# Patient Record
Sex: Female | Born: 1940
Health system: Southern US, Community
[De-identification: ages and names within clinical notes are randomized; demographics above are authoritative.]

## PROBLEM LIST (undated history)

## (undated) DIAGNOSIS — N189 Chronic kidney disease, unspecified: Secondary | ICD-10-CM

## (undated) DIAGNOSIS — M1990 Unspecified osteoarthritis, unspecified site: Secondary | ICD-10-CM

## (undated) DIAGNOSIS — J189 Pneumonia, unspecified organism: Secondary | ICD-10-CM

## (undated) DIAGNOSIS — K219 Gastro-esophageal reflux disease without esophagitis: Secondary | ICD-10-CM

## (undated) DIAGNOSIS — R0600 Dyspnea, unspecified: Secondary | ICD-10-CM

## (undated) DIAGNOSIS — I774 Celiac artery compression syndrome: Secondary | ICD-10-CM

## (undated) DIAGNOSIS — I729 Aneurysm of unspecified site: Secondary | ICD-10-CM

## (undated) DIAGNOSIS — Z853 Personal history of malignant neoplasm of breast: Secondary | ICD-10-CM

## (undated) DIAGNOSIS — I10 Essential (primary) hypertension: Secondary | ICD-10-CM

## (undated) DIAGNOSIS — C50919 Malignant neoplasm of unspecified site of unspecified female breast: Secondary | ICD-10-CM

## (undated) DIAGNOSIS — B029 Zoster without complications: Secondary | ICD-10-CM

## (undated) DIAGNOSIS — Z972 Presence of dental prosthetic device (complete) (partial): Secondary | ICD-10-CM

## (undated) DIAGNOSIS — I2699 Other pulmonary embolism without acute cor pulmonale: Secondary | ICD-10-CM

## (undated) DIAGNOSIS — E039 Hypothyroidism, unspecified: Secondary | ICD-10-CM

## (undated) DIAGNOSIS — C801 Malignant (primary) neoplasm, unspecified: Secondary | ICD-10-CM

## (undated) HISTORY — DX: Hypothyroidism, unspecified: E03.9

## (undated) HISTORY — DX: Personal history of malignant neoplasm of breast: Z85.3

## (undated) HISTORY — PX: COLONOSCOPY: SHX174

## (undated) HISTORY — DX: Essential (primary) hypertension: I10

## (undated) HISTORY — DX: Other pulmonary embolism without acute cor pulmonale: I26.99

## (undated) HISTORY — DX: Malignant (primary) neoplasm, unspecified: C80.1

## (undated) HISTORY — DX: Celiac artery compression syndrome: I77.4

## (undated) HISTORY — PX: BRAIN SURGERY: SHX531

## (undated) HISTORY — DX: Aneurysm of unspecified site: I72.9

## (undated) HISTORY — PX: LUMBAR PUNCTURE: SHX1985

## (undated) HISTORY — DX: Chronic kidney disease, unspecified: N18.9

---

## 1978-11-22 DIAGNOSIS — I10 Essential (primary) hypertension: Secondary | ICD-10-CM

## 1978-11-22 DIAGNOSIS — I729 Aneurysm of unspecified site: Secondary | ICD-10-CM

## 1978-11-22 HISTORY — DX: Essential (primary) hypertension: I10

## 1978-11-22 HISTORY — DX: Aneurysm of unspecified site: I72.9

## 1997-11-22 DIAGNOSIS — E039 Hypothyroidism, unspecified: Secondary | ICD-10-CM

## 1997-11-22 HISTORY — DX: Hypothyroidism, unspecified: E03.9

## 2002-11-22 HISTORY — PX: CHOLECYSTECTOMY: SHX55

## 2003-11-23 HISTORY — PX: BREAST CYST ASPIRATION: SHX578

## 2004-10-07 ENCOUNTER — Ambulatory Visit: Payer: Self-pay

## 2005-10-06 ENCOUNTER — Ambulatory Visit: Payer: Self-pay | Admitting: Family Medicine

## 2007-02-23 ENCOUNTER — Ambulatory Visit: Payer: Self-pay | Admitting: *Deleted

## 2007-02-28 ENCOUNTER — Ambulatory Visit: Payer: Self-pay | Admitting: Gastroenterology

## 2008-03-14 ENCOUNTER — Ambulatory Visit: Payer: Self-pay | Admitting: Family Medicine

## 2008-08-06 ENCOUNTER — Ambulatory Visit: Payer: Self-pay | Admitting: Family Medicine

## 2008-08-26 ENCOUNTER — Ambulatory Visit: Payer: Self-pay | Admitting: Surgery

## 2008-08-26 ENCOUNTER — Other Ambulatory Visit: Payer: Self-pay

## 2008-08-28 ENCOUNTER — Ambulatory Visit: Payer: Self-pay | Admitting: Surgery

## 2008-09-23 ENCOUNTER — Ambulatory Visit: Payer: Self-pay | Admitting: Gastroenterology

## 2009-08-05 ENCOUNTER — Ambulatory Visit: Payer: Self-pay | Admitting: Family Medicine

## 2011-11-23 HISTORY — PX: BREAST EXCISIONAL BIOPSY: SUR124

## 2011-11-23 HISTORY — PX: BREAST SURGERY: SHX581

## 2012-01-26 ENCOUNTER — Ambulatory Visit: Payer: Self-pay | Admitting: Family Medicine

## 2012-01-28 ENCOUNTER — Ambulatory Visit: Payer: Self-pay | Admitting: Family Medicine

## 2012-02-21 ENCOUNTER — Ambulatory Visit: Payer: Self-pay | Admitting: General Surgery

## 2012-02-21 LAB — POTASSIUM: Potassium: 3.7 mmol/L (ref 3.5–5.1)

## 2012-02-23 ENCOUNTER — Ambulatory Visit: Payer: Self-pay | Admitting: General Surgery

## 2012-07-31 ENCOUNTER — Ambulatory Visit: Payer: Self-pay | Admitting: General Surgery

## 2012-12-21 ENCOUNTER — Encounter: Payer: Self-pay | Admitting: *Deleted

## 2013-02-01 ENCOUNTER — Ambulatory Visit: Payer: Self-pay | Admitting: General Surgery

## 2013-02-08 ENCOUNTER — Encounter: Payer: Self-pay | Admitting: General Surgery

## 2013-02-08 ENCOUNTER — Ambulatory Visit (INDEPENDENT_AMBULATORY_CARE_PROVIDER_SITE_OTHER): Payer: Medicare Other | Admitting: General Surgery

## 2013-02-08 VITALS — BP 124/72 | Ht 60.0 in | Wt 193.0 lb

## 2013-02-08 DIAGNOSIS — N6099 Unspecified benign mammary dysplasia of unspecified breast: Secondary | ICD-10-CM

## 2013-02-08 DIAGNOSIS — N6089 Other benign mammary dysplasias of unspecified breast: Secondary | ICD-10-CM

## 2013-02-08 NOTE — Progress Notes (Signed)
Patient ID: Latoya Freeman, female   DOB: 21-Nov-1941, 72 y.o.   MRN: [id number]  Chief Complaint  Patient presents with  . Follow-up    mammogram     HPI Latoya Freeman is a 72 y.o. female following up from a mammogram done @[facility]  02/01/13. Patient reports no new breast problem.The patient reports she was unable to tolerate tamoxifen secondary to profound vasomotor symptoms. Previous [facility] Jan[employer] model risk analysis showed approximately 2.9% risk for breast cancer next 5 years. HPI  Past Medical History  Diagnosis Date  . Hypertension 1980  . Personal history of malignant neoplasm of breast 2013    LF Breast   . Hypothyroidism 1999  . Aneurysm 1980  . Cancer breast    2013    Past Surgical History  Procedure Laterality Date  . Breast surgery  2013    LF Breast Wide EXC   . Cholecystectomy  2004    No family history on file.  Social History History  Substance Use Topics  . Smoking status: Former Smoker -- 1.50 packs/day for 20 years  . Smokeless tobacco: Never Used  . Alcohol Use: No    Allergies  Allergen Reactions  . Penicillins Rash    Current Outpatient Prescriptions  Medication Sig Dispense Refill  . amLODipine (NORVASC) 5 MG tablet       . aspirin 81 MG tablet Take 81 mg by mouth daily.      . citalopram (CELEXA) 10 MG tablet Take 10 mg by mouth daily.      . hydrochlorothiazide (HYDRODIURIL) 25 MG tablet       . levothyroxine (SYNTHROID, LEVOTHROID) 75 MCG tablet Take 75 mcg by mouth daily.      Marland Kitchen losartan (COZAAR) 25 MG tablet Take 25 mg by mouth daily.      . medroxyPROGESTERone (PROVERA) 2.5 MG tablet Take 2.5 mg by mouth daily.      . montelukast (SINGULAIR) 10 MG tablet       . omeprazole (PRILOSEC) 20 MG capsule Take 20 mg by mouth daily.      Marland Kitchen oxybutynin (DITROPAN) 5 MG tablet Take 5 mg by mouth once.      . quinapril (ACCUPRIL) 40 MG tablet Take 40 mg by mouth at bedtime.      . tamoxifen (NOLVADEX) 20 MG tablet Take 20 mg by mouth daily.       No current  facility-administered medications for this visit.    Review of Systems Review of Systems  Constitutional: Negative.   Respiratory: Negative.   Cardiovascular: Negative.     Blood pressure 124/72, height 5' (1.524 m), weight 193 lb (87.544 kg).  Physical Exam Physical Exam  Constitutional: She appears well-developed and well-nourished.  Neck: Normal range of motion. Neck supple.  Cardiovascular: Normal rate, regular rhythm and normal heart sounds.   Pulmonary/Chest: Breath sounds normal. Right breast exhibits no inverted nipple, no nipple discharge and no skin change. Left breast exhibits no inverted nipple, no mass, no nipple discharge, no skin change and no tenderness.  Well healed scar left lower outer quadrant.    Data Reviewed Bilateral milligrams February 01, 2013 showed benign left axillary lymph node. No areas of suspicious microcalcifications or dominant mass. BIRAD-2.    Assessment    Normal post-biopsy exam     Plan    Patient to return in one year diagnotic mammogram     Latoya Freeman 02/08/2013, 3:09 PM

## 2013-04-06 ENCOUNTER — Ambulatory Visit: Payer: Self-pay | Admitting: Gastroenterology

## 2013-04-09 LAB — PATHOLOGY REPORT

## 2014-01-15 ENCOUNTER — Telehealth: Payer: Self-pay

## 2014-01-15 NOTE — Telephone Encounter (Signed)
Patient states that she would like to follow up with her doctor Jeneen Rinks Hedricks from now on. If she has any further breast problems she will be seen here for them. She said that she can not afford to be seen by multiple doctors unless it is needed.

## 2014-01-21 ENCOUNTER — Telehealth: Payer: Self-pay

## 2014-01-21 NOTE — Telephone Encounter (Signed)
Patient notified that Dr Verneda Skill will take over her care from now on. Patient is agreeable.

## 2014-01-21 NOTE — Telephone Encounter (Signed)
Message copied by Lesly Rubenstein on Mon Jan 21, 2014  9:29 AM ------      Message from: [place], [place] Gleason      Created: Wed Jan 16, 2014  3:06 PM      Regarding: RE: recalls       Notify her that Dr. Verneda Skill will need to take responsibility for future mammograms as well as Tamoxifen RX.        ----- Message -----         From: Lesly Rubenstein, LPN         Sent: [id number]  10:28 AM           To: Robert Bellow, MD      Subject: FW: recalls                                                          ----- Message -----         From: Lesly Rubenstein, LPN         Sent: [id number]   2:12 PM           To: Veverly Fells      Subject: recalls                                                  Patient in recalls for April, she has a mammogram scheduled for march. Patient states that she would like to follow up with her doctor Jeneen Rinks Hedricks from now on. She will keep her mammogram appointment. If she has any further breast problems she will be seen here for them. She said that she can not afford to be seen by multiple doctors unless it is needed.                                     ------

## 2014-02-04 ENCOUNTER — Encounter: Payer: Self-pay | Admitting: General Surgery

## 2014-02-04 ENCOUNTER — Ambulatory Visit: Payer: Self-pay | Admitting: General Surgery

## 2014-02-14 ENCOUNTER — Ambulatory Visit: Payer: Self-pay | Admitting: Ophthalmology

## 2014-02-14 DIAGNOSIS — I10 Essential (primary) hypertension: Secondary | ICD-10-CM

## 2014-02-14 LAB — POTASSIUM: POTASSIUM: 3 mmol/L — AB (ref 3.5–5.1)

## 2014-02-21 ENCOUNTER — Ambulatory Visit: Payer: Medicare Other | Admitting: General Surgery

## 2014-02-26 ENCOUNTER — Ambulatory Visit: Payer: Self-pay | Admitting: Ophthalmology

## 2014-09-06 ENCOUNTER — Emergency Department: Payer: Self-pay | Admitting: Emergency Medicine

## 2014-09-23 ENCOUNTER — Encounter: Payer: Self-pay | Admitting: General Surgery

## 2014-12-10 DIAGNOSIS — J40 Bronchitis, not specified as acute or chronic: Secondary | ICD-10-CM | POA: Diagnosis not present

## 2014-12-10 DIAGNOSIS — J0100 Acute maxillary sinusitis, unspecified: Secondary | ICD-10-CM | POA: Diagnosis not present

## 2015-03-15 NOTE — Op Note (Signed)
PATIENT NAME:  Latoya Freeman, Latoya Freeman MR#:  [id number] DATE OF BIRTH:  08/28/1941  DATE OF PROCEDURE:  02/26/2014  PREOPERATIVE DIAGNOSIS: Visually significant cataract of the left eye.   POSTOPERATIVE DIAGNOSIS: Visually significant cataract of the left eye.   OPERATIVE PROCEDURE: Cataract extraction by phacoemulsification with implant of intraocular lens to left eye.   SURGEON: Birder Robson, MD.   ANESTHESIA:  1. Managed anesthesia care.  2. Topical tetracaine drops followed by 2% Xylocaine jelly applied in the preoperative holding area.   COMPLICATIONS: None.   TECHNIQUE:  Stop and chop.   DESCRIPTION OF PROCEDURE: The patient was examined and consented in the preoperative holding area where the aforementioned topical anesthesia was applied to the left eye and then brought back to the Operating Room where the left eye was prepped and draped in the usual sterile ophthalmic fashion and a lid speculum was placed. A paracentesis was created with the side port blade and the anterior chamber was filled with viscoelastic. A near clear corneal incision was performed with the steel keratome. A continuous curvilinear capsulorrhexis was performed with a cystotome followed by the capsulorrhexis forceps. Hydrodissection and hydrodelineation were carried out with BSS on a blunt cannula. The lens was removed in a stop and chop technique and the remaining cortical material was removed with the irrigation-aspiration handpiece. The capsular bag was inflated with viscoelastic and the Alcon SN60WF 19.5-diopter lens, serial number [id number] was placed in the capsular bag without complication. The remaining viscoelastic was removed from the eye with the irrigation-aspiration handpiece. The wounds were hydrated. The anterior chamber was flushed with Miostat and the eye was inflated to physiologic pressure. Cefuroxime was not placed in the anterior chamber due to marked PENICILLIN ALLERGY, rather 0.2 mL of a diluted  Vigamox solution was placed into the anterior chamber at the end of the case. The wounds were found to be water tight. The eye was dressed with Vigamox. The patient was given protective glasses to wear throughout the day and a shield with which to sleep tonight. The patient was also given drops with which to begin a drop regimen today and will follow-up with me in one day.    ____________________________ Livingston Diones. Telicia Hodgkiss, MD wlp:sb D: 02/26/2014 12:13:34 ET T: 02/26/2014 12:51:29 ET JOB#: [id number]  cc: Quayshaun Hubbert L. Harlo Jaso, MD, <Dictator> Livingston Diones Tysen Roesler MD ELECTRONICALLY SIGNED 03/07/2014 10:52

## 2015-03-16 NOTE — Op Note (Signed)
PATIENT NAME:  Latoya Freeman, Latoya Freeman MR#:  [id number] DATE OF BIRTH:  09-19-1941  DATE OF PROCEDURE:  02/23/2012  PREOPERATIVE DIAGNOSIS: Atypical ductal hyperplasia, left breast.   POSTOPERATIVE DIAGNOSIS: Atypical ductal hyperplasia, left breast.   OPERATIVE PROCEDURE: Wide local excision of left breast.   SURGEON: Hervey Ard, MD  ANESTHESIA: General by LMA, Marcaine 0.5% plain 30 mL local infiltration.   ESTIMATED BLOOD LOSS: Less than 10 mL.   CLINICAL NOTE: This 74 year old woman underwent vacuum-assisted biopsy of a right breast mass, which was primarily cystic in nature. She was found to have evidence of ADH. Wide excision was planned to rule out a more invasive process.   OPERATIVE NOTE: With the patient under adequate general anesthesia, the area was prepped with ChloraPrep and draped. Ultrasound was used to confirm the previous biopsy cavity just outside the edge of the nipple, at the 5 o'clock position. A radial incision was made in this area from the base of the nipple to approximately 4 cm outside of the areola. The skin was incised sharply and the deep dissection completed with electrocautery. The original biopsy cavity was resected with approximately a 1 cm rim of normal tissue. Specimen radiograph confirmed the previously placed clip was removed. Gross examination showed the cavity to be intact.   The wound was closed in layers with interrupted 2-0 Vicryl figure-of-eight sutures. The skin and adipose tissue was mobilized slightly with cautery to provide smoother reapproximation of the          skin with a running 4-0 Vicryl subcuticular suture. Benzoin and Steri-Strips were applied followed by a Telfa pad and a bulky dressing with Kerlix, fluff gauze, and an Ace wrap.   The patient tolerated the procedure well and was taken to the recovery room in stable condition. ____________________________ Robert Bellow, MD jwb:slb D: 02/23/2012 20:59:38 ET T: 02/24/2012  09:36:19 ET JOB#: [id number]  cc: Robert Bellow, MD, <Dictator> Irven Easterly. Kary Kos, MD Esme Durkin Amedeo Kinsman MD ELECTRONICALLY SIGNED 02/24/2012 17:37

## 2015-03-25 DIAGNOSIS — I10 Essential (primary) hypertension: Secondary | ICD-10-CM | POA: Diagnosis not present

## 2015-03-25 DIAGNOSIS — Z0000 Encounter for general adult medical examination without abnormal findings: Secondary | ICD-10-CM | POA: Diagnosis not present

## 2015-03-25 DIAGNOSIS — E039 Hypothyroidism, unspecified: Secondary | ICD-10-CM | POA: Diagnosis not present

## 2015-03-25 DIAGNOSIS — E785 Hyperlipidemia, unspecified: Secondary | ICD-10-CM | POA: Diagnosis not present

## 2015-03-31 ENCOUNTER — Other Ambulatory Visit: Payer: Self-pay | Admitting: Family Medicine

## 2015-03-31 DIAGNOSIS — I10 Essential (primary) hypertension: Secondary | ICD-10-CM | POA: Diagnosis not present

## 2015-03-31 DIAGNOSIS — E785 Hyperlipidemia, unspecified: Secondary | ICD-10-CM | POA: Diagnosis not present

## 2015-03-31 DIAGNOSIS — E039 Hypothyroidism, unspecified: Secondary | ICD-10-CM | POA: Diagnosis not present

## 2015-03-31 DIAGNOSIS — Z1231 Encounter for screening mammogram for malignant neoplasm of breast: Secondary | ICD-10-CM

## 2015-03-31 DIAGNOSIS — N62 Hypertrophy of breast: Secondary | ICD-10-CM | POA: Diagnosis not present

## 2015-04-16 ENCOUNTER — Other Ambulatory Visit: Payer: Self-pay | Admitting: Family Medicine

## 2015-04-16 ENCOUNTER — Ambulatory Visit
Admission: RE | Admit: 2015-04-16 | Discharge: 2015-04-16 | Disposition: A | Discharge status: Home / Self Care | Payer: Commercial Managed Care - HMO | Source: Ambulatory Visit | Attending: Family Medicine | Admitting: Family Medicine

## 2015-04-16 DIAGNOSIS — Z1231 Encounter for screening mammogram for malignant neoplasm of breast: Secondary | ICD-10-CM

## 2015-04-17 DIAGNOSIS — M5134 Other intervertebral disc degeneration, thoracic region: Secondary | ICD-10-CM | POA: Diagnosis not present

## 2015-04-17 DIAGNOSIS — M9902 Segmental and somatic dysfunction of thoracic region: Secondary | ICD-10-CM | POA: Diagnosis not present

## 2015-04-17 DIAGNOSIS — M9903 Segmental and somatic dysfunction of lumbar region: Secondary | ICD-10-CM | POA: Diagnosis not present

## 2015-04-17 DIAGNOSIS — M62830 Muscle spasm of back: Secondary | ICD-10-CM | POA: Diagnosis not present

## 2015-04-18 DIAGNOSIS — M62830 Muscle spasm of back: Secondary | ICD-10-CM | POA: Diagnosis not present

## 2015-04-18 DIAGNOSIS — M9902 Segmental and somatic dysfunction of thoracic region: Secondary | ICD-10-CM | POA: Diagnosis not present

## 2015-04-18 DIAGNOSIS — M9903 Segmental and somatic dysfunction of lumbar region: Secondary | ICD-10-CM | POA: Diagnosis not present

## 2015-04-18 DIAGNOSIS — M5134 Other intervertebral disc degeneration, thoracic region: Secondary | ICD-10-CM | POA: Diagnosis not present

## 2015-04-22 DIAGNOSIS — M9902 Segmental and somatic dysfunction of thoracic region: Secondary | ICD-10-CM | POA: Diagnosis not present

## 2015-04-22 DIAGNOSIS — M62830 Muscle spasm of back: Secondary | ICD-10-CM | POA: Diagnosis not present

## 2015-04-22 DIAGNOSIS — M9903 Segmental and somatic dysfunction of lumbar region: Secondary | ICD-10-CM | POA: Diagnosis not present

## 2015-04-22 DIAGNOSIS — M5134 Other intervertebral disc degeneration, thoracic region: Secondary | ICD-10-CM | POA: Diagnosis not present

## 2015-04-23 DIAGNOSIS — M5134 Other intervertebral disc degeneration, thoracic region: Secondary | ICD-10-CM | POA: Diagnosis not present

## 2015-04-23 DIAGNOSIS — M62830 Muscle spasm of back: Secondary | ICD-10-CM | POA: Diagnosis not present

## 2015-04-23 DIAGNOSIS — M9902 Segmental and somatic dysfunction of thoracic region: Secondary | ICD-10-CM | POA: Diagnosis not present

## 2015-04-23 DIAGNOSIS — M9903 Segmental and somatic dysfunction of lumbar region: Secondary | ICD-10-CM | POA: Diagnosis not present

## 2015-04-24 DIAGNOSIS — M9902 Segmental and somatic dysfunction of thoracic region: Secondary | ICD-10-CM | POA: Diagnosis not present

## 2015-04-24 DIAGNOSIS — M62830 Muscle spasm of back: Secondary | ICD-10-CM | POA: Diagnosis not present

## 2015-04-24 DIAGNOSIS — M5134 Other intervertebral disc degeneration, thoracic region: Secondary | ICD-10-CM | POA: Diagnosis not present

## 2015-04-24 DIAGNOSIS — M9903 Segmental and somatic dysfunction of lumbar region: Secondary | ICD-10-CM | POA: Diagnosis not present

## 2015-04-28 DIAGNOSIS — M62830 Muscle spasm of back: Secondary | ICD-10-CM | POA: Diagnosis not present

## 2015-04-28 DIAGNOSIS — M9902 Segmental and somatic dysfunction of thoracic region: Secondary | ICD-10-CM | POA: Diagnosis not present

## 2015-04-28 DIAGNOSIS — M9903 Segmental and somatic dysfunction of lumbar region: Secondary | ICD-10-CM | POA: Diagnosis not present

## 2015-04-28 DIAGNOSIS — M5134 Other intervertebral disc degeneration, thoracic region: Secondary | ICD-10-CM | POA: Diagnosis not present

## 2015-04-30 DIAGNOSIS — M5134 Other intervertebral disc degeneration, thoracic region: Secondary | ICD-10-CM | POA: Diagnosis not present

## 2015-04-30 DIAGNOSIS — M62830 Muscle spasm of back: Secondary | ICD-10-CM | POA: Diagnosis not present

## 2015-04-30 DIAGNOSIS — M9903 Segmental and somatic dysfunction of lumbar region: Secondary | ICD-10-CM | POA: Diagnosis not present

## 2015-04-30 DIAGNOSIS — M9902 Segmental and somatic dysfunction of thoracic region: Secondary | ICD-10-CM | POA: Diagnosis not present

## 2015-05-01 DIAGNOSIS — M9903 Segmental and somatic dysfunction of lumbar region: Secondary | ICD-10-CM | POA: Diagnosis not present

## 2015-05-01 DIAGNOSIS — M62830 Muscle spasm of back: Secondary | ICD-10-CM | POA: Diagnosis not present

## 2015-05-01 DIAGNOSIS — M5134 Other intervertebral disc degeneration, thoracic region: Secondary | ICD-10-CM | POA: Diagnosis not present

## 2015-05-01 DIAGNOSIS — M9902 Segmental and somatic dysfunction of thoracic region: Secondary | ICD-10-CM | POA: Diagnosis not present

## 2015-05-05 DIAGNOSIS — M9902 Segmental and somatic dysfunction of thoracic region: Secondary | ICD-10-CM | POA: Diagnosis not present

## 2015-05-05 DIAGNOSIS — M62830 Muscle spasm of back: Secondary | ICD-10-CM | POA: Diagnosis not present

## 2015-05-05 DIAGNOSIS — M5134 Other intervertebral disc degeneration, thoracic region: Secondary | ICD-10-CM | POA: Diagnosis not present

## 2015-05-05 DIAGNOSIS — M9903 Segmental and somatic dysfunction of lumbar region: Secondary | ICD-10-CM | POA: Diagnosis not present

## 2015-05-07 DIAGNOSIS — M62830 Muscle spasm of back: Secondary | ICD-10-CM | POA: Diagnosis not present

## 2015-05-07 DIAGNOSIS — M9902 Segmental and somatic dysfunction of thoracic region: Secondary | ICD-10-CM | POA: Diagnosis not present

## 2015-05-07 DIAGNOSIS — M9903 Segmental and somatic dysfunction of lumbar region: Secondary | ICD-10-CM | POA: Diagnosis not present

## 2015-05-07 DIAGNOSIS — M5134 Other intervertebral disc degeneration, thoracic region: Secondary | ICD-10-CM | POA: Diagnosis not present

## 2015-05-08 DIAGNOSIS — M9903 Segmental and somatic dysfunction of lumbar region: Secondary | ICD-10-CM | POA: Diagnosis not present

## 2015-05-08 DIAGNOSIS — M62830 Muscle spasm of back: Secondary | ICD-10-CM | POA: Diagnosis not present

## 2015-05-08 DIAGNOSIS — M5134 Other intervertebral disc degeneration, thoracic region: Secondary | ICD-10-CM | POA: Diagnosis not present

## 2015-05-08 DIAGNOSIS — M9902 Segmental and somatic dysfunction of thoracic region: Secondary | ICD-10-CM | POA: Diagnosis not present

## 2015-05-12 DIAGNOSIS — M62830 Muscle spasm of back: Secondary | ICD-10-CM | POA: Diagnosis not present

## 2015-05-12 DIAGNOSIS — M5134 Other intervertebral disc degeneration, thoracic region: Secondary | ICD-10-CM | POA: Diagnosis not present

## 2015-05-12 DIAGNOSIS — M9902 Segmental and somatic dysfunction of thoracic region: Secondary | ICD-10-CM | POA: Diagnosis not present

## 2015-05-12 DIAGNOSIS — M9903 Segmental and somatic dysfunction of lumbar region: Secondary | ICD-10-CM | POA: Diagnosis not present

## 2015-05-14 DIAGNOSIS — M545 Low back pain: Secondary | ICD-10-CM | POA: Diagnosis not present

## 2015-06-03 ENCOUNTER — Encounter: Payer: Self-pay | Admitting: *Deleted

## 2015-06-03 ENCOUNTER — Emergency Department
Admission: EM | Admit: 2015-06-03 | Discharge: 2015-06-04 | Disposition: A | Discharge status: Home / Self Care | Payer: Commercial Managed Care - HMO | Attending: Emergency Medicine | Admitting: Emergency Medicine

## 2015-06-03 DIAGNOSIS — Y9389 Activity, other specified: Secondary | ICD-10-CM | POA: Diagnosis not present

## 2015-06-03 DIAGNOSIS — Y9289 Other specified places as the place of occurrence of the external cause: Secondary | ICD-10-CM | POA: Insufficient documentation

## 2015-06-03 DIAGNOSIS — T783XXA Angioneurotic edema, initial encounter: Secondary | ICD-10-CM | POA: Diagnosis not present

## 2015-06-03 DIAGNOSIS — Z79899 Other long term (current) drug therapy: Secondary | ICD-10-CM | POA: Insufficient documentation

## 2015-06-03 DIAGNOSIS — I10 Essential (primary) hypertension: Secondary | ICD-10-CM | POA: Insufficient documentation

## 2015-06-03 DIAGNOSIS — X58XXXA Exposure to other specified factors, initial encounter: Secondary | ICD-10-CM | POA: Insufficient documentation

## 2015-06-03 DIAGNOSIS — Y998 Other external cause status: Secondary | ICD-10-CM | POA: Diagnosis not present

## 2015-06-03 DIAGNOSIS — Z880 Allergy status to penicillin: Secondary | ICD-10-CM | POA: Insufficient documentation

## 2015-06-03 DIAGNOSIS — T7840XA Allergy, unspecified, initial encounter: Secondary | ICD-10-CM | POA: Diagnosis present

## 2015-06-03 DIAGNOSIS — Z87891 Personal history of nicotine dependence: Secondary | ICD-10-CM | POA: Diagnosis not present

## 2015-06-03 DIAGNOSIS — L539 Erythematous condition, unspecified: Secondary | ICD-10-CM

## 2015-06-03 DIAGNOSIS — Z7982 Long term (current) use of aspirin: Secondary | ICD-10-CM | POA: Insufficient documentation

## 2015-06-03 NOTE — ED Notes (Signed)
Pt has an allergic reaction.  Sx began at 2200 tonight.  States i was sitting in my chair.  Pt is red all over   No itching.  Pt reports diff breathing.  Pt took 2 allergy relief pills with some relief.

## 2015-06-04 DIAGNOSIS — Z880 Allergy status to penicillin: Secondary | ICD-10-CM | POA: Diagnosis not present

## 2015-06-04 DIAGNOSIS — T783XXA Angioneurotic edema, initial encounter: Secondary | ICD-10-CM | POA: Diagnosis not present

## 2015-06-04 DIAGNOSIS — Z87891 Personal history of nicotine dependence: Secondary | ICD-10-CM | POA: Diagnosis not present

## 2015-06-04 DIAGNOSIS — Z7982 Long term (current) use of aspirin: Secondary | ICD-10-CM | POA: Diagnosis not present

## 2015-06-04 DIAGNOSIS — Z79899 Other long term (current) drug therapy: Secondary | ICD-10-CM | POA: Diagnosis not present

## 2015-06-04 DIAGNOSIS — I10 Essential (primary) hypertension: Secondary | ICD-10-CM | POA: Diagnosis not present

## 2015-06-04 MED ORDER — DIPHENHYDRAMINE HCL 25 MG PO CAPS: 25.0000 mg | ORAL_CAPSULE | Freq: Four times a day (QID) | ORAL | Status: DC | PRN

## 2015-06-04 MED ORDER — METHYLPREDNISOLONE SODIUM SUCC 125 MG IJ SOLR
125.0000 mg | Freq: Once | INTRAMUSCULAR | Status: AC
  Administered 2015-06-04: 125 mg via INTRAVENOUS
  Filled 2015-06-04: qty 2

## 2015-06-04 MED ORDER — DIPHENHYDRAMINE HCL 50 MG/ML IJ SOLN
25.0000 mg | Freq: Once | INTRAMUSCULAR | Status: AC
  Administered 2015-06-04: 25 mg via INTRAVENOUS
  Filled 2015-06-04: qty 1

## 2015-06-04 MED ORDER — FAMOTIDINE 20 MG PO TABS: 20.0000 mg | ORAL_TABLET | Freq: Two times a day (BID) | ORAL | Status: DC

## 2015-06-04 MED ORDER — PREDNISONE 20 MG PO TABS: 60.0000 mg | ORAL_TABLET | Freq: Every day | ORAL | Status: DC

## 2015-06-04 MED ORDER — FAMOTIDINE IN NACL 20-0.9 MG/50ML-% IV SOLN
20.0000 mg | Freq: Once | INTRAVENOUS | Status: AC
  Administered 2015-06-04: 20 mg via INTRAVENOUS
  Filled 2015-06-04: qty 50

## 2015-06-04 MED ORDER — EPINEPHRINE HCL 1 MG/ML IJ SOLN
INTRAMUSCULAR | Status: AC
  Filled 2015-06-04: qty 1

## 2015-06-04 NOTE — Discharge Instructions (Signed)
Angioedema °Angioedema is a sudden swelling of tissues, often of the skin. It can occur on the face or genitals or in the abdomen or other body parts. The swelling usually develops over a short period and gets better in 24 to 48 hours. It often begins during the night and is found when the person wakes up. The person may also get red, itchy patches of skin (hives). Angioedema can be dangerous if it involves swelling of the air passages.  °Depending on the cause, episodes of angioedema may only happen once, come back in unpredictable patterns, or repeat for several years and then gradually fade away.  °CAUSES  °Angioedema can be caused by an allergic reaction to various triggers. It can also result from nonallergic causes, including reactions to drugs, immune system disorders, viral infections, or an abnormal gene that is passed to you from your parents (hereditary). For some people with angioedema, the cause is unknown.  °Some things that can trigger angioedema include:  °· Foods.   °· Medicines, such as ACE inhibitors, ARBs, nonsteroidal anti-inflammatory agents, or estrogen.   °· Latex.   °· Animal saliva.   °· Insect stings.   °· Dyes used in X-rays.   °· Mild injury.   °· Dental work. °· Surgery. °· Stress.   °· Sudden changes in temperature.   °· Exercise. °SIGNS AND SYMPTOMS  °· Swelling of the skin. °· Hives. If these are present, there is also intense itching. °· Redness in the affected area.   °· Pain in the affected area. °· Swollen lips or tongue. °· Breathing problems. This may happen if the air passages swell. °· Wheezing. °If internal organs are involved, there may be:  °· Nausea.   °· Abdominal pain.   °· Vomiting.   °· Difficulty swallowing.   °· Difficulty passing urine. °DIAGNOSIS  °· Your health care provider will examine the affected area and take a medical and family history. °· Various tests may be done to help determine the cause. Tests may include: °· Allergy skin tests to see if the problem  is an allergic reaction.   °· Blood tests to check for hereditary angioedema.   °· Tests to check for underlying diseases that could cause the condition.   °· A review of your medicines, including over-the-counter medicines, may be done. °TREATMENT  °Treatment will depend on the cause of the angioedema. Possible treatments include:  °· Removal of anything that triggered the condition (such as stopping certain medicines).   °· Medicines to treat symptoms or prevent attacks. Medicines given may include:   °· Antihistamines.   °· Epinephrine injection.   °· Steroids.   °· Hospitalization may be required for severe attacks. If the air passages are affected, it can be an emergency. Tubes may need to be placed to keep the airway open. °HOME CARE INSTRUCTIONS  °· Take all medicines as directed by your health care provider. °· If you were given medicines for emergency allergy treatment, always carry them with you. °· Wear a medical bracelet as directed by your health care provider.   °· Avoid known triggers. °SEEK MEDICAL CARE IF:  °· You have repeat attacks of angioedema.   °· Your attacks are more frequent or more severe despite preventive measures.   °· You have hereditary angioedema and are considering having children. It is important to discuss with your health care provider the risks of passing the condition on to your children. °SEEK IMMEDIATE MEDICAL CARE IF:  °· You have severe swelling of the mouth, tongue, or lips. °· You have difficulty breathing.   °· You have difficulty swallowing.   °· You faint. °MAKE   SURE YOU: °· Understand these instructions. °· Will watch your condition. °· Will get help right away if you are not doing well or get worse. °Document Released: 01/17/2002 Document Revised: 03/25/2014 Document Reviewed: 07/02/2013 °[employer]® Patient Information ©2015 [employer]. This information is not intended to replace advice given to you by your health care provider. Make sure you discuss any questions  you have with your health care provider. ° °Drug Allergy °Allergic reactions to medicines are common. Some allergic reactions are mild. A delayed type of drug allergy that occurs 1 week or more after exposure to a medicine or vaccine is called serum sickness. A life-threatening, sudden (acute) allergic reaction that involves the whole body is called anaphylaxis. °CAUSES  °"True" drug allergies occur when there is an allergic reaction to a medicine. This is caused by overactivity of the immune system. First, the body becomes sensitized. The immune system is triggered by your first exposure to the medicine. Following this first exposure, future exposure to the same medicine may be life-threatening. °Almost any medicine can cause an allergic reaction. Common ones are: °· Penicillin. °· Sulfonamides (sulfa drugs). °· Local anesthetics. °· X-ray dyes that contain iodine. °SYMPTOMS  °Common symptoms of a minor allergic reaction are: °· Swelling around the mouth. °· An itchy red rash or hives. °· Vomiting or diarrhea. °Anaphylaxis can cause swelling of the mouth and throat. This makes it difficult to breathe and swallow. Severe reactions can be fatal within seconds, even after exposure to only a trace amount of the drug that causes the reaction. °HOME CARE INSTRUCTIONS  °· If you are unsure of what caused your reaction, keep a diary of foods and medicines used. Include the symptoms that followed. Avoid anything that causes reactions. °· You may want to follow up with an allergy specialist after the reaction has cleared in order to be tested to confirm the allergy. It is important to confirm that your reaction is an allergy, not just a side effect to the medicine. If you have a true allergy to a medicine, this may prevent that medicine and related medicines from being given to you when you are very ill. °· If you have hives or a rash: °¨ Take medicines as directed by your caregiver. °¨ You may use an over-the-counter  antihistamine (diphenhydramine) as needed. °¨ Apply cold compresses to the skin or take baths in cool water. Avoid hot baths or showers. °· If you are severely allergic: °¨ Continuous observation after a severe reaction may be needed. Hospitalization is often required. °¨ Wear a medical alert bracelet or necklace stating your allergy. °¨ You and your family must learn how to use an anaphylaxis kit or give an epinephrine injection to temporarily treat an emergency allergic reaction. If you have had a severe reaction, always carry your epinephrine injection or anaphylaxis kit with you. This can be lifesaving if you have a severe reaction. °· Do not drive or perform tasks after treatment until the medicines used to treat your reaction have worn off, or until your caregiver says it is okay. °SEEK MEDICAL CARE IF:  °· You think you had an allergic reaction. Symptoms usually start within 30 minutes after exposure. °· Symptoms are getting worse rather than better. °· You develop new symptoms. °· The symptoms that brought you to your caregiver return. °SEEK IMMEDIATE MEDICAL CARE IF:  °· You have swelling of the mouth, difficulty breathing, or wheezing. °· You have a tight feeling in your chest or throat. °· You   develop hives, swelling, or itching all over your body.  You develop severe vomiting or diarrhea.  You feel faint or pass out. This is an emergency. Use your epinephrine injection or anaphylaxis kit as you have been instructed. Call for emergency medical help. Even if you improve after the injection, you need to be examined at a hospital emergency department. MAKE SURE YOU:   Understand these instructions.  Will watch your condition.  Will get help right away if you are not doing well or get worse. Document Released: 11/08/2005 Document Revised: 01/31/2012 Document Reviewed: 04/14/2011 [facility] Patient Information 2015 [place], [place]. This information is not intended to replace advice given to you by  your health care provider. Make sure you discuss any questions you have with your health care provider.  PLEASE HOLD YOUR ACCUPRIL UNTIL YOU CAN FOLLOW UP WITH YOUR PRIMARY CARE PHYSICIAN AS IT MAY HAVE BEEN THE CAUSE OF YOUR REACTION TONIGHT

## 2015-06-04 NOTE — ED Notes (Signed)
Pt reports that she ingested Pepparoni, Hamburger, and Cheese Pizza this evening prior to developing a generalized burning sensation and throat swelling.

## 2015-06-04 NOTE — ED Provider Notes (Signed)
[facility] Emergency Department Provider Note  ____________________________________________  Time seen: Approximately 12:08 AM  I have reviewed the triage vital signs and the nursing notes.   HISTORY  Chief Complaint Allergic Reaction    HPI Latoya Freeman is a 74 y.o. female who comes in with swelling all over, redness on her skin and burning. She reports the symptoms started at 945. The patient reports that she was watching TV and talking to her sister when the symptoms started. The patient denies any itching to her skin. The patient reports that 2 years ago she was placed on a cancer medication that caused her to breakout in a rash and become itchy but it did not burn and have the redness like this episode. The patient reports that once it started she felt as though her throat was closing. The patient took 2 allergy tablets, cetirizine which did help the feeling of her throat closing. The patient reports that she has not taken any other new medication. She is on quinapril for blood pressure. The patient denies any chest pain but had some mild shortness of breath. The patient came in for further evaluation and treatment of her symptoms.   Past Medical History  Diagnosis Date  . Hypertension 1980  . Hypothyroidism 1999  . Aneurysm 1980  . Personal history of malignant neoplasm of breast     LF Breast     There are no active problems to display for this patient.   Past Surgical History  Procedure Laterality Date  . Breast surgery  2013    LF Breast Wide EXC   . Cholecystectomy  2004  . Breast biopsy Left 2014    neg    Current Outpatient Rx  Name  Route  Sig  Dispense  Refill  . amLODipine (NORVASC) 5 MG tablet               . aspirin 81 MG tablet   Oral   Take 81 mg by mouth daily.         . citalopram (CELEXA) 10 MG tablet   Oral   Take 10 mg by mouth daily.         . diphenhydrAMINE (BENADRYL) 25 mg capsule   Oral   Take 1  capsule (25 mg total) by mouth every 6 (six) hours as needed (allergy symptoms).   15 capsule   0   . famotidine (PEPCID) 20 MG tablet   Oral   Take 1 tablet (20 mg total) by mouth 2 (two) times daily.   14 tablet   0   . hydrochlorothiazide (HYDRODIURIL) 25 MG tablet               . levothyroxine (SYNTHROID, LEVOTHROID) 75 MCG tablet   Oral   Take 75 mcg by mouth daily.         Marland Kitchen losartan (COZAAR) 25 MG tablet   Oral   Take 25 mg by mouth daily.         . medroxyPROGESTERone (PROVERA) 2.5 MG tablet   Oral   Take 2.5 mg by mouth daily.         . montelukast (SINGULAIR) 10 MG tablet               . omeprazole (PRILOSEC) 20 MG capsule   Oral   Take 20 mg by mouth daily.         Marland Kitchen oxybutynin (DITROPAN) 5 MG tablet   Oral   Take  5 mg by mouth once.         . predniSONE (DELTASONE) 20 MG tablet   Oral   Take 3 tablets (60 mg total) by mouth daily.   12 tablet   0   . quinapril (ACCUPRIL) 40 MG tablet   Oral   Take 40 mg by mouth at bedtime.         . tamoxifen (NOLVADEX) 20 MG tablet   Oral   Take 20 mg by mouth daily.           Allergies Penicillins  No family history on file.  Social History History  Substance Use Topics  . Smoking status: Former Smoker -- 1.50 packs/day for 20 years  . Smokeless tobacco: Never Used  . Alcohol Use: No    Review of Systems Constitutional: No fever/chills Eyes: No visual changes. ENT: No sore throat. Cardiovascular: Denies chest pain. Respiratory: shortness of breath. Gastrointestinal: No abdominal pain.  No nausea, no vomiting.  No diarrhea.  No constipation. Genitourinary: Negative for dysuria. Musculoskeletal: Negative for back pain. Skin: Erythema to skin Neurological: Negative for headaches, focal weakness or numbness.  10-point ROS otherwise negative.  ____________________________________________   PHYSICAL EXAM:  VITAL SIGNS: ED Triage Vitals  Enc Vitals Group     BP 06/03/15  2349 152/75 mmHg     Pulse Rate 06/03/15 2349 68     Resp 06/03/15 2349 22     Temp 06/03/15 2349 98.8 F (37.1 C)     Temp Source 06/03/15 2349 Oral     SpO2 06/03/15 2349 94 %     Weight 06/03/15 2349 192 lb (87.091 kg)     Height 06/03/15 2349 5\' 1"  (1.549 m)     Head Cir --      Peak Flow --      Pain Score --      Pain Loc --      Pain Edu? --      Excl. in [place]? --     Constitutional: Alert and oriented. Well appearing and in mild distress. Eyes: Conjunctivae are normal. PERRL. EOMI. Head: Atraumatic. Nose: No congestion/rhinnorhea. Mouth/Throat: Mild swelling of lips no swelling to posterior oropharynx Mucous membranes are moist.  Oropharynx non-erythematous. Cardiovascular: Normal rate, regular rhythm. Grossly normal heart sounds.  Good peripheral circulation. Respiratory: Normal respiratory effort.  No retractions. Lungs CTAB. Gastrointestinal: Soft and nontender. No distention. Active bowel sounds Genitourinary: Deferred Musculoskeletal: No lower extremity tenderness nor edema.   Neurologic:  Normal speech and language.  Skin:  Erythematous skin with no hives noted no tenderness to palpation of skin. Psychiatric: Mood and affect are normal.   ____________________________________________   LABS (all labs ordered are listed, but only abnormal results are displayed)  Labs Reviewed - No data to display ____________________________________________  EKG  None ____________________________________________  RADIOLOGY  None ____________________________________________   PROCEDURES  Procedure(s) performed: None  Critical Care performed: No  ____________________________________________   INITIAL IMPRESSION / ASSESSMENT AND PLAN / ED COURSE  Pertinent labs & imaging results that were available during my care of the patient were reviewed by me and considered in my medical decision making (see chart for details).  This is a 74 year old female who comes in with  some swelling to her lips and throat as well as some erythema to her skin with a concern for possible allergic reaction. The patient does take an Ace inhibitor which could be the cause of her symptoms. I will give the patient some Solu-Medrol Benadryl  and Pepcid and reassess the patient when she's receive these medications and had some time for them to take effect. I did recommend to the patient that her quinapril can be the cause of her symptoms although she has been taking it for 30 years.   ----------------------------------------- 3:02 AM on 06/04/2015 -----------------------------------------  The patient's erythema has improved significantly on her feet and her body she does still have some mild erythema on her left arm. The patient reports that she has not had any increased swelling. The patient discharged home to follow-up with her primary care physician. I informed the patient that she should hold taking her Accupril as it may be the cause of her reaction today. She reports that she has an appointment with her primary care physician tomorrow. ____________________________________________   FINAL CLINICAL IMPRESSION(S) / ED DIAGNOSES  Final diagnoses:  Allergic reaction, initial encounter  Skin erythema  Angioedema, initial encounter      Loney Hering, MD 06/04/15 [phone]

## 2015-06-04 NOTE — ED Notes (Signed)

## 2015-06-04 NOTE — ED Notes (Signed)
Dermatological redness has cleared up on patients abdomen, arms, face, and legs.

## 2015-06-05 DIAGNOSIS — M5441 Lumbago with sciatica, right side: Secondary | ICD-10-CM | POA: Diagnosis not present

## 2015-06-05 DIAGNOSIS — Z889 Allergy status to unspecified drugs, medicaments and biological substances status: Secondary | ICD-10-CM | POA: Diagnosis not present

## 2015-09-08 ENCOUNTER — Emergency Department
Admission: EM | Admit: 2015-09-08 | Discharge: 2015-09-08 | Disposition: A | Discharge status: Home / Self Care | Payer: Commercial Managed Care - HMO | Attending: Emergency Medicine | Admitting: Emergency Medicine

## 2015-09-08 ENCOUNTER — Encounter: Payer: Self-pay | Admitting: Emergency Medicine

## 2015-09-08 DIAGNOSIS — M62838 Other muscle spasm: Secondary | ICD-10-CM | POA: Diagnosis not present

## 2015-09-08 DIAGNOSIS — Z87891 Personal history of nicotine dependence: Secondary | ICD-10-CM | POA: Insufficient documentation

## 2015-09-08 DIAGNOSIS — Z79899 Other long term (current) drug therapy: Secondary | ICD-10-CM | POA: Insufficient documentation

## 2015-09-08 DIAGNOSIS — Z7982 Long term (current) use of aspirin: Secondary | ICD-10-CM | POA: Insufficient documentation

## 2015-09-08 DIAGNOSIS — Z880 Allergy status to penicillin: Secondary | ICD-10-CM | POA: Insufficient documentation

## 2015-09-08 DIAGNOSIS — Z7952 Long term (current) use of systemic steroids: Secondary | ICD-10-CM | POA: Insufficient documentation

## 2015-09-08 DIAGNOSIS — I10 Essential (primary) hypertension: Secondary | ICD-10-CM | POA: Insufficient documentation

## 2015-09-08 DIAGNOSIS — M542 Cervicalgia: Secondary | ICD-10-CM | POA: Diagnosis present

## 2015-09-08 MED ORDER — MELOXICAM 15 MG PO TABS
15.0000 mg | ORAL_TABLET | Freq: Every day | ORAL | Status: DC
Start: 2015-09-08 — End: 2018-07-20

## 2015-09-08 MED ORDER — CYCLOBENZAPRINE HCL 10 MG PO TABS: 10.0000 mg | ORAL_TABLET | Freq: Three times a day (TID) | ORAL | Status: DC | PRN

## 2015-09-08 MED ORDER — KETOROLAC TROMETHAMINE 30 MG/ML IJ SOLN
60.0000 mg | Freq: Once | INTRAMUSCULAR | Status: AC
  Administered 2015-09-08: 60 mg via INTRAMUSCULAR
  Filled 2015-09-08: qty 2

## 2015-09-08 NOTE — ED Notes (Signed)
Pt reports neck pain and upper back pain x 3 weeks, reports no known injury. States pain has increased for the past 2 days, has been constant with movement. Pt denies chest pain, abdominal pain, dizziness, or shortness of breath. Denies having similar symptoms. Pt A&O x 4, respirations even and unlabored, skin warm and dry. Call bell within reach. Roderic Palau, PA at bedside.

## 2015-09-08 NOTE — ED Provider Notes (Signed)
[facility] Emergency Department Provider Note  ____________________________________________  Time seen: Approximately 9:20 PM  I have reviewed the triage vital signs and the nursing notes.   HISTORY  Chief Complaint Neck Pain    HPI Latoya Freeman is a 74 y.o. female resistance the emergency department complaining of left neck and left shoulder pain 3 weeks. She states that she initially woke up one morning and thought she "slept on her pleural." She has had off and on symptoms for the last 2 have weeks with some "tightness and pain." In that shoulder. She states over the last 2-3 days symptoms have greatly increased and now includes her right neck as well. It's that occasionally she'll have a fiery sensation running down her lateral arm. He states that the pain is now moderate to severe and worse with movement. States it is a tight burning sensation. She denies any injury prior to symptoms.   Past Medical History  Diagnosis Date  . Hypertension 1980  . Hypothyroidism 1999  . Aneurysm ([place]) 1980  . Personal history of malignant neoplasm of breast     LF Breast     There are no active problems to display for this patient.   Past Surgical History  Procedure Laterality Date  . Breast surgery  2013    LF Breast Wide EXC   . Cholecystectomy  2004  . Breast biopsy Left 2014    neg    Current Outpatient Rx  Name  Route  Sig  Dispense  Refill  . amLODipine (NORVASC) 5 MG tablet               . aspirin 81 MG tablet   Oral   Take 81 mg by mouth daily.         . citalopram (CELEXA) 10 MG tablet   Oral   Take 10 mg by mouth daily.         . cyclobenzaprine (FLEXERIL) 10 MG tablet   Oral   Take 1 tablet (10 mg total) by mouth 3 (three) times daily as needed for muscle spasms.   15 tablet   0   . diphenhydrAMINE (BENADRYL) 25 mg capsule   Oral   Take 1 capsule (25 mg total) by mouth every 6 (six) hours as needed (allergy symptoms).    15 capsule   0   . famotidine (PEPCID) 20 MG tablet   Oral   Take 1 tablet (20 mg total) by mouth 2 (two) times daily.   14 tablet   0   . hydrochlorothiazide (HYDRODIURIL) 25 MG tablet               . levothyroxine (SYNTHROID, LEVOTHROID) 75 MCG tablet   Oral   Take 75 mcg by mouth daily.         Marland Kitchen losartan (COZAAR) 25 MG tablet   Oral   Take 25 mg by mouth daily.         . medroxyPROGESTERone (PROVERA) 2.5 MG tablet   Oral   Take 2.5 mg by mouth daily.         . meloxicam (MOBIC) 15 MG tablet   Oral   Take 1 tablet (15 mg total) by mouth daily.   30 tablet   0   . montelukast (SINGULAIR) 10 MG tablet               . omeprazole (PRILOSEC) 20 MG capsule   Oral   Take 20 mg by mouth  daily.         . oxybutynin (DITROPAN) 5 MG tablet   Oral   Take 5 mg by mouth once.         . predniSONE (DELTASONE) 20 MG tablet   Oral   Take 3 tablets (60 mg total) by mouth daily.   12 tablet   0   . quinapril (ACCUPRIL) 40 MG tablet   Oral   Take 40 mg by mouth at bedtime.         . tamoxifen (NOLVADEX) 20 MG tablet   Oral   Take 20 mg by mouth daily.           Allergies Penicillins  No family history on file.  Social History Social History  Substance Use Topics  . Smoking status: Former Smoker -- 1.50 packs/day for 20 years  . Smokeless tobacco: Never Used  . Alcohol Use: No    Review of Systems Constitutional: No fever/chills Eyes: No visual changes. ENT: No sore throat. Cardiovascular: Denies chest pain. Respiratory: Denies shortness of breath. Gastrointestinal: No abdominal pain.  No nausea, no vomiting.  No diarrhea.  No constipation. Genitourinary: Negative for dysuria. Musculoskeletal: Negative for back pain. She endorses left-sided neck and shoulder pain.  Skin: Negative for rash. Neurological: Negative for headaches, focal weakness or numbness.  10-point ROS otherwise  negative.  ____________________________________________   PHYSICAL EXAM:  VITAL SIGNS: ED Triage Vitals  Enc Vitals Group     BP 09/08/15 2101 136/66 mmHg     Pulse Rate 09/08/15 2101 64     Resp 09/08/15 2101 16     Temp 09/08/15 2101 98.2 F (36.8 C)     Temp Source 09/08/15 2101 Oral     SpO2 09/08/15 2101 96 %     Weight 09/08/15 2101 190 lb (86.183 kg)     Height 09/08/15 2101 5\' 1"  (1.549 m)     Head Cir --      Peak Flow --      Pain Score 09/08/15 2103 8     Pain Loc --      Pain Edu? --      Excl. in [place]? --     Constitutional: Alert and oriented. Well appearing and in no acute distress. Eyes: Conjunctivae are normal. PERRL. EOMI. Head: Atraumatic. Nose: No congestion/rhinnorhea. Mouth/Throat: Mucous membranes are moist.  Oropharynx non-erythematous. Neck: No stridor.  No cervical spine tenderness to palpation. Spasms noted to right and left paraspinal muscle groups of the cervical region. Cardiovascular: Normal rate, regular rhythm. Grossly normal heart sounds.  Good peripheral circulation. Respiratory: Normal respiratory effort.  No retractions. Lungs CTAB. Gastrointestinal: Soft and nontender. No distention. No abdominal bruits. No CVA tenderness. Musculoskeletal: No lower extremity tenderness nor edema.  No joint effusions. Tenderness to palpation over the shoulder girdle and left side. Spasms are noted in shoulder girdle. No other acute findings. Pulses and sensation are intact distally lateral upper extremities. Neurologic:  Normal speech and language. No gross focal neurologic deficits are appreciated. No gait instability. Skin:  Skin is warm, dry and intact. No rash noted. Psychiatric: Mood and affect are normal. Speech and behavior are normal.  ____________________________________________   LABS (all labs ordered are listed, but only abnormal results are displayed)  Labs Reviewed - No data to  display ____________________________________________  EKG   ____________________________________________  RADIOLOGY   ____________________________________________   PROCEDURES  Procedure(s) performed: None  Critical Care performed: No  ____________________________________________   INITIAL IMPRESSION / ASSESSMENT AND  PLAN / ED COURSE  Pertinent labs & imaging results that were available during my care of the patient were reviewed by me and considered in my medical decision making (see chart for details).  Agents history, symptoms, physical exam are consistent with muscle spasms to the cervical paraspinal muscles bilaterally and left shoulder girdle. Advised patient of findings and diagnosis. We provided a shot of Toradol for symptom control tonight and I discharged patient home with muscle relaxers and anti-inflammatories. The patient verbalizes understanding of diagnosis and understanding of treatment plan. She verbalizes that she will comply with same. ____________________________________________   FINAL CLINICAL IMPRESSION(S) / ED DIAGNOSES  Final diagnoses:  Cervical paraspinal muscle spasm  Muscle spasm of left shoulder      Darletta Moll, PA-C 09/08/15 2143  Orbie Pyo, MD 09/08/15 2156

## 2015-09-08 NOTE — ED Notes (Signed)
C/O left neck / upper back pain x 3 weeks.  Pain worsening over past two days.  Pain across upper back, up neck.  Worse with movement.  600 mb Ibuprofen taken at 1900, no relief.

## 2015-09-08 NOTE — Discharge Instructions (Signed)
Heat Therapy °Heat therapy can help ease sore, stiff, injured, and tight muscles and joints. Heat relaxes your muscles, which may help ease your pain.  °RISKS AND COMPLICATIONS °If you have any of the following conditions, do not use heat therapy unless your health care provider has approved: °· Poor circulation. °· Healing wounds or scarred skin in the area being treated. °· Diabetes, heart disease, or high blood pressure. °· Not being able to feel (numbness) the area being treated. °· Unusual swelling of the area being treated. °· Active infections. °· Blood clots. °· Cancer. °· Inability to communicate pain. This may include young children and people who have problems with their brain function (dementia). °· Pregnancy. °Heat therapy should only be used on old, pre-existing, or long-lasting (chronic) injuries. Do not use heat therapy on new injuries unless directed by your health care provider. °HOW TO USE HEAT THERAPY °There are several different kinds of heat therapy, including: °· Moist heat pack. °· Warm water bath. °· Hot water bottle. °· Electric heating pad. °· Heated gel pack. °· Heated wrap. °· Electric heating pad. °Use the heat therapy method suggested by your health care provider. Follow your health care provider's instructions on when and how to use heat therapy. °GENERAL HEAT THERAPY RECOMMENDATIONS °· Do not sleep while using heat therapy. Only use heat therapy while you are awake. °· Your skin may turn pink while using heat therapy. Do not use heat therapy if your skin turns red. °· Do not use heat therapy if you have new pain. °· High heat or long exposure to heat can cause burns. Be careful when using heat therapy to avoid burning your skin. °· Do not use heat therapy on areas of your skin that are already irritated, such as with a rash or sunburn. °SEEK MEDICAL CARE IF: °· You have blisters, redness, swelling, or numbness. °· You have new pain. °· Your pain is worse. °MAKE SURE  YOU: °· Understand these instructions. °· Will watch your condition. °· Will get help right away if you are not doing well or get worse. °  °This information is not intended to replace advice given to you by your health care provider. Make sure you discuss any questions you have with your health care provider. °  °Document Released: 01/31/2012 Document Revised: 11/29/2014 Document Reviewed: 01/01/2014 °Elsevier Interactive Patient Education ©2016 [employer] Inc[employer] ° °Muscle Cramps and Spasms °Muscle cramps and spasms occur when a muscle or muscles tighten and you have no control over this tightening (involuntary muscle contraction). They are a common problem and can develop in any muscle. The most common place is in the calf muscles of the leg. Both muscle cramps and muscle spasms are involuntary muscle contractions, but they also have differences:  °· Muscle cramps are sporadic and painful. They may last a few seconds to a quarter of an hour. Muscle cramps are often more forceful and last longer than muscle spasms. °· Muscle spasms may or may not be painful. They may also last just a few seconds or much longer. °CAUSES  °It is uncommon for cramps or spasms to be due to a serious underlying problem. In many cases, the cause of cramps or spasms is unknown. Some common causes are:  °· Overexertion.   °· Overuse from repetitive motions (doing the same thing over and over).   °· Remaining in a certain position for a long period of time.   °· Improper preparation, form, or technique while performing a sport or activity.   °·   Dehydration.   °· Injury.   °· Side effects of some medicines.   °· Abnormally low levels of the salts and ions in your blood (electrolytes), especially potassium and calcium. This could happen if you are taking water pills (diuretics) or you are pregnant.   °Some underlying medical problems can make it more likely to develop cramps or spasms. These include, but are not limited to:  °· Diabetes.    °· Parkinson disease.   °· Hormone disorders, such as thyroid problems.   °· Alcohol abuse.   °· Diseases specific to muscles, joints, and bones.   °· Blood vessel disease where not enough blood is getting to the muscles.   °HOME CARE INSTRUCTIONS  °· Stay well hydrated. Drink enough water and fluids to keep your urine clear or pale yellow. °· It may be helpful to massage, stretch, and relax the affected muscle. °· For tight or tense muscles, use a warm towel, heating pad, or hot shower water directed to the affected area. °· If you are sore or have pain after a cramp or spasm, applying ice to the affected area may relieve discomfort. °¨ Put ice in a plastic bag. °¨ Place a towel between your skin and the bag. °¨ Leave the ice on for 15-20 minutes, 03-04 times a day. °· Medicines used to treat a known cause of cramps or spasms may help reduce their frequency or severity. Only take over-the-counter or prescription medicines as directed by your caregiver. °SEEK MEDICAL CARE IF:  °Your cramps or spasms get more severe, more frequent, or do not improve over time.  °MAKE SURE YOU:  °· Understand these instructions. °· Will watch your condition. °· Will get help right away if you are not doing well or get worse. °  °This information is not intended to replace advice given to you by your health care provider. Make sure you discuss any questions you have with your health care provider. °  °Document Released: 04/30/2002 Document Revised: 03/05/2013 Document Reviewed: 10/25/2012 °Elsevier Interactive Patient Education ©2016 [employer] Inc. ° °

## 2015-09-08 NOTE — ED Notes (Signed)

## 2015-09-22 DIAGNOSIS — M542 Cervicalgia: Secondary | ICD-10-CM | POA: Diagnosis not present

## 2015-09-22 DIAGNOSIS — M5032 Other cervical disc degeneration, mid-cervical region, unspecified level: Secondary | ICD-10-CM | POA: Diagnosis not present

## 2015-09-29 DIAGNOSIS — I10 Essential (primary) hypertension: Secondary | ICD-10-CM | POA: Diagnosis not present

## 2015-09-29 DIAGNOSIS — E785 Hyperlipidemia, unspecified: Secondary | ICD-10-CM | POA: Diagnosis not present

## 2015-09-29 DIAGNOSIS — M542 Cervicalgia: Secondary | ICD-10-CM | POA: Diagnosis not present

## 2015-09-29 DIAGNOSIS — E876 Hypokalemia: Secondary | ICD-10-CM | POA: Diagnosis not present

## 2015-09-29 DIAGNOSIS — Z23 Encounter for immunization: Secondary | ICD-10-CM | POA: Diagnosis not present

## 2015-10-10 DIAGNOSIS — M9902 Segmental and somatic dysfunction of thoracic region: Secondary | ICD-10-CM | POA: Diagnosis not present

## 2015-10-10 DIAGNOSIS — M9903 Segmental and somatic dysfunction of lumbar region: Secondary | ICD-10-CM | POA: Diagnosis not present

## 2015-10-10 DIAGNOSIS — M5134 Other intervertebral disc degeneration, thoracic region: Secondary | ICD-10-CM | POA: Diagnosis not present

## 2015-10-10 DIAGNOSIS — M62830 Muscle spasm of back: Secondary | ICD-10-CM | POA: Diagnosis not present

## 2015-10-13 DIAGNOSIS — M9902 Segmental and somatic dysfunction of thoracic region: Secondary | ICD-10-CM | POA: Diagnosis not present

## 2015-10-13 DIAGNOSIS — M5134 Other intervertebral disc degeneration, thoracic region: Secondary | ICD-10-CM | POA: Diagnosis not present

## 2015-10-13 DIAGNOSIS — M62830 Muscle spasm of back: Secondary | ICD-10-CM | POA: Diagnosis not present

## 2015-10-13 DIAGNOSIS — M9903 Segmental and somatic dysfunction of lumbar region: Secondary | ICD-10-CM | POA: Diagnosis not present

## 2015-10-15 DIAGNOSIS — M9902 Segmental and somatic dysfunction of thoracic region: Secondary | ICD-10-CM | POA: Diagnosis not present

## 2015-10-15 DIAGNOSIS — M5134 Other intervertebral disc degeneration, thoracic region: Secondary | ICD-10-CM | POA: Diagnosis not present

## 2015-10-15 DIAGNOSIS — M9903 Segmental and somatic dysfunction of lumbar region: Secondary | ICD-10-CM | POA: Diagnosis not present

## 2015-10-15 DIAGNOSIS — M62830 Muscle spasm of back: Secondary | ICD-10-CM | POA: Diagnosis not present

## 2015-10-21 DIAGNOSIS — M9903 Segmental and somatic dysfunction of lumbar region: Secondary | ICD-10-CM | POA: Diagnosis not present

## 2015-10-21 DIAGNOSIS — M62830 Muscle spasm of back: Secondary | ICD-10-CM | POA: Diagnosis not present

## 2015-10-21 DIAGNOSIS — M9902 Segmental and somatic dysfunction of thoracic region: Secondary | ICD-10-CM | POA: Diagnosis not present

## 2015-10-21 DIAGNOSIS — M5134 Other intervertebral disc degeneration, thoracic region: Secondary | ICD-10-CM | POA: Diagnosis not present

## 2015-10-23 DIAGNOSIS — M62830 Muscle spasm of back: Secondary | ICD-10-CM | POA: Diagnosis not present

## 2015-10-23 DIAGNOSIS — M9903 Segmental and somatic dysfunction of lumbar region: Secondary | ICD-10-CM | POA: Diagnosis not present

## 2015-10-23 DIAGNOSIS — M5134 Other intervertebral disc degeneration, thoracic region: Secondary | ICD-10-CM | POA: Diagnosis not present

## 2015-10-23 DIAGNOSIS — M9902 Segmental and somatic dysfunction of thoracic region: Secondary | ICD-10-CM | POA: Diagnosis not present

## 2015-11-06 DIAGNOSIS — M62830 Muscle spasm of back: Secondary | ICD-10-CM | POA: Diagnosis not present

## 2015-11-06 DIAGNOSIS — M5134 Other intervertebral disc degeneration, thoracic region: Secondary | ICD-10-CM | POA: Diagnosis not present

## 2015-11-06 DIAGNOSIS — M9903 Segmental and somatic dysfunction of lumbar region: Secondary | ICD-10-CM | POA: Diagnosis not present

## 2015-11-06 DIAGNOSIS — M9902 Segmental and somatic dysfunction of thoracic region: Secondary | ICD-10-CM | POA: Diagnosis not present

## 2016-05-17 ENCOUNTER — Other Ambulatory Visit: Payer: Self-pay | Admitting: Family Medicine

## 2016-05-17 DIAGNOSIS — Z1231 Encounter for screening mammogram for malignant neoplasm of breast: Secondary | ICD-10-CM

## 2016-06-01 ENCOUNTER — Ambulatory Visit
Admission: RE | Admit: 2016-06-01 | Discharge: 2016-06-01 | Disposition: A | Discharge status: Home / Self Care | Payer: Medicare HMO | Source: Ambulatory Visit | Attending: Family Medicine | Admitting: Family Medicine

## 2016-06-01 ENCOUNTER — Other Ambulatory Visit: Payer: Self-pay | Admitting: Family Medicine

## 2016-06-01 DIAGNOSIS — Z1231 Encounter for screening mammogram for malignant neoplasm of breast: Secondary | ICD-10-CM | POA: Diagnosis not present

## 2016-07-19 ENCOUNTER — Other Ambulatory Visit: Payer: Self-pay | Admitting: Otolaryngology

## 2016-07-19 DIAGNOSIS — E041 Nontoxic single thyroid nodule: Secondary | ICD-10-CM

## 2016-07-19 DIAGNOSIS — R490 Dysphonia: Secondary | ICD-10-CM

## 2016-07-27 ENCOUNTER — Ambulatory Visit
Admission: RE | Admit: 2016-07-27 | Discharge: 2016-07-27 | Disposition: A | Discharge status: Home / Self Care | Payer: Medicare HMO | Source: Ambulatory Visit | Attending: Otolaryngology | Admitting: Otolaryngology

## 2016-07-27 DIAGNOSIS — E041 Nontoxic single thyroid nodule: Secondary | ICD-10-CM

## 2016-07-27 DIAGNOSIS — R490 Dysphonia: Secondary | ICD-10-CM

## 2016-12-30 ENCOUNTER — Telehealth: Payer: Self-pay | Admitting: *Deleted

## 2016-12-30 NOTE — Telephone Encounter (Signed)
Phone call from [facility] ENT Dr Tami Ribas office, asking what mediation she was on post breast cancer. According to our records it was Tamoxifen, pt states she is not on it now because she broke out in a rash. They plan on doing allergy testing on her.

## 2017-05-16 ENCOUNTER — Other Ambulatory Visit: Payer: Self-pay | Admitting: Family Medicine

## 2017-05-16 DIAGNOSIS — Z1231 Encounter for screening mammogram for malignant neoplasm of breast: Secondary | ICD-10-CM

## 2017-06-02 ENCOUNTER — Ambulatory Visit
Admission: RE | Admit: 2017-06-02 | Discharge: 2017-06-02 | Disposition: A | Discharge status: Home / Self Care | Payer: Medicare HMO | Source: Ambulatory Visit | Attending: Family Medicine | Admitting: Family Medicine

## 2017-06-02 DIAGNOSIS — Z1231 Encounter for screening mammogram for malignant neoplasm of breast: Secondary | ICD-10-CM | POA: Diagnosis present

## 2018-01-30 ENCOUNTER — Encounter: Payer: Self-pay | Admitting: *Deleted

## 2018-01-31 ENCOUNTER — Ambulatory Visit: Payer: Medicare HMO | Admitting: Anesthesiology

## 2018-01-31 ENCOUNTER — Encounter: Payer: Self-pay | Admitting: Anesthesiology

## 2018-01-31 ENCOUNTER — Encounter
Admission: RE | Disposition: A | Discharge status: Home / Self Care | Payer: Self-pay | Source: Ambulatory Visit | Attending: Gastroenterology

## 2018-01-31 ENCOUNTER — Ambulatory Visit
Admission: RE | Admit: 2018-01-31 | Discharge: 2018-01-31 | Disposition: A | Discharge status: Home / Self Care | Payer: Medicare HMO | Source: Ambulatory Visit | Attending: Gastroenterology | Admitting: Gastroenterology

## 2018-01-31 DIAGNOSIS — K2950 Unspecified chronic gastritis without bleeding: Secondary | ICD-10-CM | POA: Diagnosis not present

## 2018-01-31 DIAGNOSIS — I10 Essential (primary) hypertension: Secondary | ICD-10-CM | POA: Diagnosis not present

## 2018-01-31 DIAGNOSIS — K259 Gastric ulcer, unspecified as acute or chronic, without hemorrhage or perforation: Secondary | ICD-10-CM | POA: Diagnosis not present

## 2018-01-31 DIAGNOSIS — Z7982 Long term (current) use of aspirin: Secondary | ICD-10-CM | POA: Insufficient documentation

## 2018-01-31 DIAGNOSIS — K228 Other specified diseases of esophagus: Secondary | ICD-10-CM | POA: Diagnosis not present

## 2018-01-31 DIAGNOSIS — K3189 Other diseases of stomach and duodenum: Secondary | ICD-10-CM | POA: Diagnosis not present

## 2018-01-31 DIAGNOSIS — K648 Other hemorrhoids: Secondary | ICD-10-CM | POA: Diagnosis not present

## 2018-01-31 DIAGNOSIS — K5790 Diverticulosis of intestine, part unspecified, without perforation or abscess without bleeding: Secondary | ICD-10-CM | POA: Diagnosis not present

## 2018-01-31 DIAGNOSIS — Z79899 Other long term (current) drug therapy: Secondary | ICD-10-CM | POA: Insufficient documentation

## 2018-01-31 DIAGNOSIS — K635 Polyp of colon: Secondary | ICD-10-CM | POA: Diagnosis not present

## 2018-01-31 DIAGNOSIS — K219 Gastro-esophageal reflux disease without esophagitis: Secondary | ICD-10-CM | POA: Diagnosis not present

## 2018-01-31 DIAGNOSIS — D123 Benign neoplasm of transverse colon: Secondary | ICD-10-CM | POA: Insufficient documentation

## 2018-01-31 DIAGNOSIS — K317 Polyp of stomach and duodenum: Secondary | ICD-10-CM | POA: Insufficient documentation

## 2018-01-31 DIAGNOSIS — Z7952 Long term (current) use of systemic steroids: Secondary | ICD-10-CM | POA: Diagnosis not present

## 2018-01-31 DIAGNOSIS — Z1211 Encounter for screening for malignant neoplasm of colon: Secondary | ICD-10-CM | POA: Diagnosis not present

## 2018-01-31 DIAGNOSIS — Z800 Family history of malignant neoplasm of digestive organs: Secondary | ICD-10-CM | POA: Diagnosis not present

## 2018-01-31 DIAGNOSIS — Z853 Personal history of malignant neoplasm of breast: Secondary | ICD-10-CM | POA: Insufficient documentation

## 2018-01-31 DIAGNOSIS — Z79890 Hormone replacement therapy (postmenopausal): Secondary | ICD-10-CM | POA: Insufficient documentation

## 2018-01-31 DIAGNOSIS — K5730 Diverticulosis of large intestine without perforation or abscess without bleeding: Secondary | ICD-10-CM | POA: Insufficient documentation

## 2018-01-31 DIAGNOSIS — K210 Gastro-esophageal reflux disease with esophagitis: Secondary | ICD-10-CM | POA: Insufficient documentation

## 2018-01-31 DIAGNOSIS — E039 Hypothyroidism, unspecified: Secondary | ICD-10-CM | POA: Insufficient documentation

## 2018-01-31 DIAGNOSIS — Z87891 Personal history of nicotine dependence: Secondary | ICD-10-CM | POA: Insufficient documentation

## 2018-01-31 DIAGNOSIS — K2960 Other gastritis without bleeding: Secondary | ICD-10-CM | POA: Diagnosis not present

## 2018-01-31 DIAGNOSIS — K2270 Barrett's esophagus without dysplasia: Secondary | ICD-10-CM | POA: Insufficient documentation

## 2018-01-31 HISTORY — DX: Gastro-esophageal reflux disease without esophagitis: K21.9

## 2018-01-31 HISTORY — PX: ESOPHAGOGASTRODUODENOSCOPY: SHX5428

## 2018-01-31 HISTORY — DX: Zoster without complications: B02.9

## 2018-01-31 HISTORY — PX: COLONOSCOPY WITH PROPOFOL: SHX5780

## 2018-01-31 SURGERY — EGD (ESOPHAGOGASTRODUODENOSCOPY)
Anesthesia: General

## 2018-01-31 MED ORDER — FENTANYL CITRATE (PF) 100 MCG/2ML IJ SOLN
INTRAMUSCULAR | Status: DC | PRN
  Administered 2018-01-31 (×2): 25 ug via INTRAVENOUS
  Administered 2018-01-31: 50 ug via INTRAVENOUS

## 2018-01-31 MED ORDER — PROPOFOL 500 MG/50ML IV EMUL
INTRAVENOUS | Status: DC | PRN
  Administered 2018-01-31: 120 ug/kg/min via INTRAVENOUS

## 2018-01-31 MED ORDER — EPHEDRINE SULFATE 50 MG/ML IJ SOLN
INTRAMUSCULAR | Status: DC | PRN
  Administered 2018-01-31: 10 mg via INTRAVENOUS

## 2018-01-31 MED ORDER — LIDOCAINE HCL (PF) 2 % IJ SOLN
INTRAMUSCULAR | Status: AC
  Filled 2018-01-31: qty 10

## 2018-01-31 MED ORDER — LIDOCAINE HCL (PF) 1 % IJ SOLN
2.0000 mL | Freq: Once | INTRAMUSCULAR | Status: AC
  Administered 2018-01-31: 0.3 mL via INTRADERMAL

## 2018-01-31 MED ORDER — EPHEDRINE SULFATE 50 MG/ML IJ SOLN
INTRAMUSCULAR | Status: AC
  Filled 2018-01-31: qty 1

## 2018-01-31 MED ORDER — PROPOFOL 10 MG/ML IV BOLUS
INTRAVENOUS | Status: AC
  Filled 2018-01-31: qty 20

## 2018-01-31 MED ORDER — PROPOFOL 500 MG/50ML IV EMUL
INTRAVENOUS | Status: AC
  Filled 2018-01-31: qty 50

## 2018-01-31 MED ORDER — SODIUM CHLORIDE 0.9 % IV SOLN
INTRAVENOUS | Status: DC
  Administered 2018-01-31: 1000 mL via INTRAVENOUS

## 2018-01-31 MED ORDER — LIDOCAINE HCL (PF) 1 % IJ SOLN
INTRAMUSCULAR | Status: AC
Start: 2018-01-31 — End: 2018-01-31
  Administered 2018-01-31: 0.3 mL via INTRADERMAL
  Filled 2018-01-31: qty 2

## 2018-01-31 MED ORDER — FENTANYL CITRATE (PF) 100 MCG/2ML IJ SOLN
INTRAMUSCULAR | Status: AC
  Filled 2018-01-31: qty 2

## 2018-01-31 MED ORDER — SODIUM CHLORIDE 0.9 % IV SOLN: INTRAVENOUS | Status: DC

## 2018-01-31 MED ORDER — LIDOCAINE HCL (CARDIAC) 20 MG/ML IV SOLN
INTRAVENOUS | Status: DC | PRN
  Administered 2018-01-31: 30 mg via INTRAVENOUS

## 2018-01-31 NOTE — Anesthesia Procedure Notes (Signed)
Performed by: Vaughan Sine Oxygen Delivery Method: Simple face mask

## 2018-01-31 NOTE — Anesthesia Postprocedure Evaluation (Signed)
Anesthesia Post Note  Patient: Latoya Freeman  Procedure(s) Performed: ESOPHAGOGASTRODUODENOSCOPY (EGD) (N/A ) COLONOSCOPY WITH PROPOFOL (N/A )  Patient location during evaluation: Endoscopy Anesthesia Type: General Level of consciousness: awake and alert Pain management: pain level controlled Vital Signs Assessment: post-procedure vital signs reviewed and stable Respiratory status: spontaneous breathing, nonlabored ventilation, respiratory function stable and patient connected to nasal cannula oxygen Cardiovascular status: blood pressure returned to baseline and stable Postop Assessment: no apparent nausea or vomiting Anesthetic complications: no     Last Vitals:  Vitals:   01/31/18 1216 01/31/18 1226  BP: 136/76 130/82  Pulse: 68 67  Resp: 15 15  Temp:    SpO2: 95% 95%    Last Pain:  Vitals:   01/31/18 1156  TempSrc: Tympanic                 Martha Clan

## 2018-01-31 NOTE — Anesthesia Post-op Follow-up Note (Signed)
Anesthesia QCDR form completed.        

## 2018-01-31 NOTE — Transfer of Care (Signed)
Immediate Anesthesia Transfer of Care Note  Patient: Latoya Freeman  Procedure(s) Performed: ESOPHAGOGASTRODUODENOSCOPY (EGD) (N/A ) COLONOSCOPY WITH PROPOFOL (N/A )  Patient Location: PACU  Anesthesia Type:General  Level of Consciousness: awake and sedated  Airway & Oxygen Therapy: Patient Spontanous Breathing and Patient connected to nasal cannula oxygen  Post-op Assessment: Report given to RN and Post -op Vital signs reviewed and stable  Post vital signs: Reviewed and stable  Last Vitals:  Vitals:   01/31/18 1027  BP: (!) 165/73  Pulse: 63  Resp: 17  Temp: (!) 36 C  SpO2: 96%    Last Pain:  Vitals:   01/31/18 1027  TempSrc: Tympanic         Complications: No apparent anesthesia complications

## 2018-01-31 NOTE — Op Note (Signed)
[facility] Gastroenterology Patient Name: Latoya Freeman Procedure Date: 01/31/2018 11:00 AM MRN: [id number] Account #: [id number] Date of Birth: [birth year] Admit Type: Outpatient Age: 77 Room: [facility] ENDO ROOM 3 Gender: Female Note Status: Finalized Procedure:            Colonoscopy Indications:          Family history of colon cancer in a first-degree                        relative Providers:            Lollie Sails, MD Referring MD:         Youlanda Roys. Lovie Macadamia, MD (Referring MD) Medicines:            Monitored Anesthesia Care Complications:        No immediate complications. Procedure:            Pre-Anesthesia Assessment:                       - ASA Grade Assessment: III - A patient with severe                        systemic disease.                       After obtaining informed consent, the colonoscope was                        passed under direct vision. Throughout the procedure,                        the patient's blood pressure, pulse, and oxygen                        saturations were monitored continuously. The                        Colonoscope was introduced through the anus and                        advanced to the the cecum, identified by appendiceal                        orifice and ileocecal valve. The colonoscopy was                        performed with moderate difficulty due to a tortuous                        colon. Successful completion of the procedure was aided                        by changing the patient to a prone position. The                        patient tolerated the procedure well. The quality of                        the bowel preparation was good. Findings:      A few small-mouthed diverticula were found in the sigmoid colon,  descending colon, transverse colon and ascending colon.      A 3 mm polyp was found in the hepatic flexure. The polyp was sessile.       The polyp was removed with a cold biopsy forceps.  Resection and       retrieval were complete.      A 4 mm polyp was found in the splenic flexure. The polyp was sessile.       The polyp was removed with a cold snare. Resection and retrieval were       complete. Impression:           - Diverticulosis in the sigmoid colon, in the                        descending colon, in the transverse colon and in the                        ascending colon.                       - One 3 mm polyp at the hepatic flexure, removed with a                        cold biopsy forceps. Resected and retrieved.                       - One 4 mm polyp at the splenic flexure, removed with a                        cold snare. Resected and retrieved. Recommendation:       - Await pathology results.                       - Telephone GI clinic for pathology results in 1 week. Procedure Code(s):    --- Professional ---                       [phone], Colonoscopy, flexible; with removal of tumor(s),                        polyp(s), or other lesion(s) by snare technique                       [id number], 30, Colonoscopy, flexible; with biopsy, single                        or multiple Diagnosis Code(s):    --- Professional ---                       D12.3, Benign neoplasm of transverse colon (hepatic                        flexure or splenic flexure)                       Z80.0, Family history of malignant neoplasm of                        digestive organs  K57.30, Diverticulosis of large intestine without                        perforation or abscess without bleeding CPT copyright 2016 American Medical Association. All rights reserved. The codes documented in this report are preliminary and upon coder review may  be revised to meet current compliance requirements. Lollie Sails, MD 01/31/2018 11:55:44 AM This report has been signed electronically. Number of Addenda: 0 Note Initiated On: 01/31/2018 11:00 AM Scope Withdrawal Time: 0 hours 12 minutes 8  seconds  Total Procedure Duration: 0 hours 28 minutes 4 seconds       [facility]

## 2018-01-31 NOTE — Anesthesia Procedure Notes (Signed)
Performed by: Cook-Martin, Logen Heintzelman Pre-anesthesia Checklist: Patient identified, Emergency Drugs available, Suction available, Patient being monitored and Timeout performed Patient Re-evaluated:Patient Re-evaluated prior to induction Oxygen Delivery Method: Nasal cannula Preoxygenation: Pre-oxygenation with 100% oxygen Induction Type: IV induction Airway Equipment and Method: Bite block Placement Confirmation: positive ETCO2 and CO2 detector       

## 2018-01-31 NOTE — H&P (Signed)
Outpatient short stay form Pre-procedure 01/31/2018 10:54 AM Latoya Sails MD  Primary Physician: Dr. [name]  Reason for visit: EGD and colonoscopy  History of present illness: Patient is a 77 year old female presenting today as above.  She has personal history of Barrett's esophagus as well as family history of colon cancer primary relative, brother.  She tolerated prep well.  She does take a baby aspirin which has been held for a couple of days.  She takes no other aspirin products or blood thinning agents.  Patient's last colonoscopy was 04/06/2013.  Her last EGD was at that time as well.  It is of note that her last EGD was negative for Barrett's.    Current Facility-Administered Medications:  .  0.9 %  sodium chloride infusion, , Intravenous, Continuous, Latoya Sails, MD, Last Rate: 20 mL/hr at 01/31/18 1047, 1,000 mL at 01/31/18 1047 .  0.9 %  sodium chloride infusion, , Intravenous, Continuous, Latoya Sails, MD  Medications Prior to Admission  Medication Sig Dispense Refill Last Dose  . amLODipine (NORVASC) 5 MG tablet    Taking  . aspirin 81 MG tablet Take 81 mg by mouth daily.   01/27/2018  . citalopram (CELEXA) 10 MG tablet Take 10 mg by mouth daily.   Taking  . cyclobenzaprine (FLEXERIL) 10 MG tablet Take 1 tablet (10 mg total) by mouth 3 (three) times daily as needed for muscle spasms. 15 tablet 0   . diphenhydrAMINE (BENADRYL) 25 mg capsule Take 1 capsule (25 mg total) by mouth every 6 (six) hours as needed (allergy symptoms). 15 capsule 0   . famotidine (PEPCID) 20 MG tablet Take 1 tablet (20 mg total) by mouth 2 (two) times daily. 14 tablet 0   . hydrochlorothiazide (HYDRODIURIL) 25 MG tablet    01/31/2018 at 0600  . levothyroxine (SYNTHROID, LEVOTHROID) 75 MCG tablet Take 75 mcg by mouth daily.   01/31/2018 at 0600  . losartan (COZAAR) 25 MG tablet Take 25 mg by mouth daily.   Taking  . medroxyPROGESTERone (PROVERA) 2.5 MG tablet Take 2.5 mg by mouth daily.    Taking  . meloxicam (MOBIC) 15 MG tablet Take 1 tablet (15 mg total) by mouth daily. 30 tablet 0   . montelukast (SINGULAIR) 10 MG tablet    Taking  . omeprazole (PRILOSEC) 20 MG capsule Take 20 mg by mouth daily.   Taking  . oxybutynin (DITROPAN) 5 MG tablet Take 5 mg by mouth once.   Taking  . predniSONE (DELTASONE) 20 MG tablet Take 3 tablets (60 mg total) by mouth daily. 12 tablet 0   . quinapril (ACCUPRIL) 40 MG tablet Take 40 mg by mouth at bedtime.   Taking  . tamoxifen (NOLVADEX) 20 MG tablet Take 20 mg by mouth daily.   Not Taking     Allergies  Allergen Reactions  . Bee Venom Swelling  . Shellfish Allergy Swelling  . Penicillins Rash     Past Medical History:  Diagnosis Date  . Aneurysm ([place]) 1980  . Cancer ([place])   . GERD (gastroesophageal reflux disease)   . Hypertension 1980  . Hypothyroidism 1999  . Personal history of malignant neoplasm of breast    LF Breast   . Shingles     Review of systems:      Physical Exam    Heart and lungs: Regular rate and rhythm without rub or gallop, lungs are bilaterally clear.    HEENT: Normocephalic atraumatic eyes are anicteric  Other:    Pertinant exam for procedure: Soft nontender nondistended bowel sounds positive normoactive.    Planned proceedures: EGD, colonoscopy and indicated procedures. I have discussed the risks benefits and complications of procedures to include not limited to bleeding, infection, perforation and the risk of sedation and the patient wishes to proceed.    Latoya Sails, MD Gastroenterology 01/31/2018  10:54 AM

## 2018-01-31 NOTE — Op Note (Signed)
[facility] Gastroenterology Patient Name: Latoya Freeman Procedure Date: 01/31/2018 11:01 AM MRN: [id number] Account #: [id number] Date of Birth: 05/22/1941 Admit Type: Outpatient Age: 77 Room: [facility] ENDO ROOM 3 Gender: Female Note Status: Finalized Procedure:            Upper GI endoscopy Indications:          Follow-up of Barrett's esophagus Providers:            Lollie Sails, MD Referring MD:         Youlanda Roys. Lovie Macadamia, MD (Referring MD) Medicines:            Monitored Anesthesia Care Complications:        No immediate complications. Procedure:            Pre-Anesthesia Assessment:                       - ASA Grade Assessment: III - A patient with severe                        systemic disease.                       After obtaining informed consent, the endoscope was                        passed under direct vision. Throughout the procedure,                        the patient's blood pressure, pulse, and oxygen                        saturations were monitored continuously. The Endoscope                        was introduced through the mouth, and advanced to the                        third part of duodenum. The upper GI endoscopy was                        accomplished without difficulty. The patient tolerated                        the procedure well. Findings:      The Z-line was variable.      There were esophageal mucosal changes suggestive of short-segment       Barrett's esophagus present at the gastroesophageal junction. The       maximum longitudinal extent of these mucosal changes was 0.5 cm in       length. Mucosa was biopsied with a cold forceps for histology in 4       quadrants. One specimen bottle was sent to pathology.      The exam of the esophagus was otherwise normal.      Patchy mild inflammation characterized by erosions and erythema was       found in the gastric body and in the gastric antrum. Biopsies were taken       with a  cold forceps for histology. Biopsies were taken with a cold       forceps for Helicobacter pylori testing.  The cardia and gastric fundus were normal on retroflexion.      A single 2 mm sessile polyp with no bleeding and no stigmata of recent       bleeding was found on the anterior wall of the gastric body. The polyp       was removed with a cold biopsy forceps. Resection and retrieval were       complete.      The examined duodenum was normal. Impression:           - Z-line variable.                       - Esophageal mucosal changes suggestive of                        short-segment Barrett's esophagus. Biopsied.                       - Erosive gastritis. Biopsied.                       - A single gastric polyp. Resected and retrieved.                       - Normal examined duodenum. Recommendation:       - Continue present medications.                       - Telephone GI clinic for pathology results in 1 week. Procedure Code(s):    --- Professional ---                       [phone], Esophagogastroduodenoscopy, flexible, transoral;                        with biopsy, single or multiple Diagnosis Code(s):    --- Professional ---                       K22.8, Other specified diseases of esophagus                       K22.70, Barrett's esophagus without dysplasia                       K29.60, Other gastritis without bleeding                       K31.7, Polyp of stomach and duodenum CPT copyright 2016 American Medical Association. All rights reserved. The codes documented in this report are preliminary and upon coder review may  be revised to meet current compliance requirements. Lollie Sails, MD 01/31/2018 11:21:45 AM This report has been signed electronically. Number of Addenda: 0 Note Initiated On: 01/31/2018 11:01 AM      [facility]

## 2018-01-31 NOTE — Anesthesia Preprocedure Evaluation (Signed)
Anesthesia Evaluation  Patient identified by MRN, date of birth, ID band Patient awake    Reviewed: Allergy & Precautions, H&P , NPO status , Patient's Chart, lab work & pertinent test results, reviewed documented beta blocker date and time   History of Anesthesia Complications Negative for: history of anesthetic complications  Airway Mallampati: I  TM Distance: >3 FB Neck ROM: full    Dental  (+) Upper Dentures, Lower Dentures, Edentulous Upper, Edentulous Lower   Pulmonary neg pulmonary ROS, former smoker,           Cardiovascular Exercise Tolerance: Good hypertension, (-) angina(-) CAD, (-) Past MI, (-) Cardiac Stents and (-) CABG (-) dysrhythmias (-) Valvular Problems/Murmurs     Neuro/Psych negative neurological ROS  negative psych ROS   GI/Hepatic Neg liver ROS, GERD  ,  Endo/Other  neg diabetesHypothyroidism   Renal/GU negative Renal ROS  negative genitourinary   Musculoskeletal   Abdominal   Peds  Hematology negative hematology ROS (+)   Anesthesia Other Findings Past Medical History: 1980: Aneurysm ([place]) No date: Cancer ([place]) No date: GERD (gastroesophageal reflux disease) 1980: Hypertension 1999: Hypothyroidism No date: Personal history of malignant neoplasm of breast     Comment:  LF Breast  No date: Shingles   Reproductive/Obstetrics negative OB ROS                             Anesthesia Physical Anesthesia Plan  ASA: III  Anesthesia Plan: General   Post-op Pain Management:    Induction: Intravenous  PONV Risk Score and Plan: 3 and Propofol infusion  Airway Management Planned: Nasal Cannula  Additional Equipment:   Intra-op Plan:   Post-operative Plan:   Informed Consent: I have reviewed the patients History and Physical, chart, labs and discussed the procedure including the risks, benefits and alternatives for the proposed anesthesia with the patient or  authorized representative who has indicated his/her understanding and acceptance.   Dental Advisory Given  Plan Discussed with: Anesthesiologist, CRNA and Surgeon  Anesthesia Plan Comments:         Anesthesia Quick Evaluation

## 2018-02-01 ENCOUNTER — Encounter: Payer: Self-pay | Admitting: Gastroenterology

## 2018-02-02 LAB — SURGICAL PATHOLOGY

## 2018-03-01 DIAGNOSIS — M9903 Segmental and somatic dysfunction of lumbar region: Secondary | ICD-10-CM | POA: Diagnosis not present

## 2018-03-01 DIAGNOSIS — M5134 Other intervertebral disc degeneration, thoracic region: Secondary | ICD-10-CM | POA: Diagnosis not present

## 2018-03-01 DIAGNOSIS — M9902 Segmental and somatic dysfunction of thoracic region: Secondary | ICD-10-CM | POA: Diagnosis not present

## 2018-03-01 DIAGNOSIS — M62830 Muscle spasm of back: Secondary | ICD-10-CM | POA: Diagnosis not present

## 2018-03-21 DIAGNOSIS — E876 Hypokalemia: Secondary | ICD-10-CM | POA: Diagnosis not present

## 2018-03-28 DIAGNOSIS — R5381 Other malaise: Secondary | ICD-10-CM | POA: Diagnosis not present

## 2018-03-28 DIAGNOSIS — E039 Hypothyroidism, unspecified: Secondary | ICD-10-CM | POA: Diagnosis not present

## 2018-03-28 DIAGNOSIS — I10 Essential (primary) hypertension: Secondary | ICD-10-CM | POA: Diagnosis not present

## 2018-03-28 DIAGNOSIS — Z636 Dependent relative needing care at home: Secondary | ICD-10-CM | POA: Diagnosis not present

## 2018-03-28 DIAGNOSIS — R5383 Other fatigue: Secondary | ICD-10-CM | POA: Diagnosis not present

## 2018-03-29 DIAGNOSIS — M9902 Segmental and somatic dysfunction of thoracic region: Secondary | ICD-10-CM | POA: Diagnosis not present

## 2018-03-29 DIAGNOSIS — M5134 Other intervertebral disc degeneration, thoracic region: Secondary | ICD-10-CM | POA: Diagnosis not present

## 2018-03-29 DIAGNOSIS — M9903 Segmental and somatic dysfunction of lumbar region: Secondary | ICD-10-CM | POA: Diagnosis not present

## 2018-03-29 DIAGNOSIS — M62830 Muscle spasm of back: Secondary | ICD-10-CM | POA: Diagnosis not present

## 2018-04-26 DIAGNOSIS — M5134 Other intervertebral disc degeneration, thoracic region: Secondary | ICD-10-CM | POA: Diagnosis not present

## 2018-04-26 DIAGNOSIS — M9903 Segmental and somatic dysfunction of lumbar region: Secondary | ICD-10-CM | POA: Diagnosis not present

## 2018-04-26 DIAGNOSIS — M9902 Segmental and somatic dysfunction of thoracic region: Secondary | ICD-10-CM | POA: Diagnosis not present

## 2018-04-26 DIAGNOSIS — M62830 Muscle spasm of back: Secondary | ICD-10-CM | POA: Diagnosis not present

## 2018-05-15 ENCOUNTER — Other Ambulatory Visit: Payer: Self-pay | Admitting: Family Medicine

## 2018-05-15 DIAGNOSIS — Z1231 Encounter for screening mammogram for malignant neoplasm of breast: Secondary | ICD-10-CM

## 2018-05-22 DIAGNOSIS — M25512 Pain in left shoulder: Secondary | ICD-10-CM | POA: Diagnosis not present

## 2018-05-24 DIAGNOSIS — M9903 Segmental and somatic dysfunction of lumbar region: Secondary | ICD-10-CM | POA: Diagnosis not present

## 2018-05-24 DIAGNOSIS — M9902 Segmental and somatic dysfunction of thoracic region: Secondary | ICD-10-CM | POA: Diagnosis not present

## 2018-05-24 DIAGNOSIS — M62830 Muscle spasm of back: Secondary | ICD-10-CM | POA: Diagnosis not present

## 2018-05-24 DIAGNOSIS — M5134 Other intervertebral disc degeneration, thoracic region: Secondary | ICD-10-CM | POA: Diagnosis not present

## 2018-06-05 ENCOUNTER — Ambulatory Visit
Admission: RE | Admit: 2018-06-05 | Discharge: 2018-06-05 | Disposition: A | Discharge status: Home / Self Care | Payer: Medicare HMO | Source: Ambulatory Visit | Attending: Family Medicine | Admitting: Family Medicine

## 2018-06-05 DIAGNOSIS — Z1231 Encounter for screening mammogram for malignant neoplasm of breast: Secondary | ICD-10-CM | POA: Insufficient documentation

## 2018-06-05 HISTORY — DX: Malignant neoplasm of unspecified site of unspecified female breast: C50.919

## 2018-06-15 DIAGNOSIS — M25512 Pain in left shoulder: Secondary | ICD-10-CM | POA: Diagnosis not present

## 2018-06-21 DIAGNOSIS — E669 Obesity, unspecified: Secondary | ICD-10-CM | POA: Insufficient documentation

## 2018-06-21 DIAGNOSIS — M25512 Pain in left shoulder: Secondary | ICD-10-CM | POA: Diagnosis not present

## 2018-06-28 ENCOUNTER — Other Ambulatory Visit: Payer: Self-pay | Admitting: Orthopedic Surgery

## 2018-06-28 DIAGNOSIS — M25512 Pain in left shoulder: Secondary | ICD-10-CM

## 2018-07-13 ENCOUNTER — Ambulatory Visit
Admission: RE | Admit: 2018-07-13 | Discharge: 2018-07-13 | Disposition: A | Discharge status: Home / Self Care | Payer: Medicare HMO | Source: Ambulatory Visit | Attending: Orthopedic Surgery | Admitting: Orthopedic Surgery

## 2018-07-13 ENCOUNTER — Ambulatory Visit: Payer: Medicare HMO

## 2018-07-13 DIAGNOSIS — M19012 Primary osteoarthritis, left shoulder: Secondary | ICD-10-CM | POA: Insufficient documentation

## 2018-07-13 DIAGNOSIS — M75122 Complete rotator cuff tear or rupture of left shoulder, not specified as traumatic: Secondary | ICD-10-CM | POA: Diagnosis not present

## 2018-07-13 DIAGNOSIS — M25512 Pain in left shoulder: Secondary | ICD-10-CM

## 2018-07-13 MED ORDER — SODIUM CHLORIDE 0.9 % IJ SOLN
10.0000 mL | INTRAMUSCULAR | Status: DC | PRN
  Administered 2018-07-13: 10 mL

## 2018-07-13 MED ORDER — IOPAMIDOL (ISOVUE-200) INJECTION 41%
15.0000 mL | Freq: Once | INTRAVENOUS | Status: AC | PRN
  Administered 2018-07-13: 15 mL
  Filled 2018-07-13: qty 50

## 2018-07-13 MED ORDER — LIDOCAINE HCL (PF) 1 % IJ SOLN
5.0000 mL | Freq: Once | INTRAMUSCULAR | Status: DC
  Filled 2018-07-13: qty 5

## 2018-07-20 ENCOUNTER — Other Ambulatory Visit: Payer: Self-pay

## 2018-07-20 ENCOUNTER — Encounter: Payer: Self-pay | Admitting: *Deleted

## 2018-07-21 NOTE — Anesthesia Preprocedure Evaluation (Addendum)
Anesthesia Evaluation  Patient identified by MRN, date of birth, ID band Patient awake    Reviewed: Allergy & Precautions, H&P , NPO status , Patient's Chart, lab work & pertinent test results  Airway Mallampati: II  TM Distance: >3 FB Neck ROM: full    Dental no notable dental hx.    Pulmonary former smoker,    Pulmonary exam normal breath sounds clear to auscultation       Cardiovascular hypertension, Normal cardiovascular exam Rhythm:regular Rate:Normal     Neuro/Psych    GI/Hepatic GERD  ,  Endo/Other  Hypothyroidism   Renal/GU      Musculoskeletal   Abdominal   Peds  Hematology   Anesthesia Other Findings   Reproductive/Obstetrics                            Anesthesia Physical Anesthesia Plan  ASA: II  Anesthesia Plan: General LMA   Post-op Pain Management:  Regional for Post-op pain   Induction: Intravenous  PONV Risk Score and Plan: 3 and Ondansetron, Midazolam and Treatment may vary due to age or medical condition  Airway Management Planned: LMA  Additional Equipment:   Intra-op Plan:   Post-operative Plan:   Informed Consent: I have reviewed the patients History and Physical, chart, labs and discussed the procedure including the risks, benefits and alternatives for the proposed anesthesia with the patient or authorized representative who has indicated his/her understanding and acceptance.     Plan Discussed with: CRNA  Anesthesia Plan Comments:         Anesthesia Quick Evaluation

## 2018-07-25 ENCOUNTER — Ambulatory Visit: Payer: Medicare HMO | Admitting: Anesthesiology

## 2018-07-25 ENCOUNTER — Encounter
Admission: RE | Disposition: A | Discharge status: Home / Self Care | Payer: Self-pay | Source: Ambulatory Visit | Attending: Orthopedic Surgery

## 2018-07-25 ENCOUNTER — Ambulatory Visit
Admission: RE | Admit: 2018-07-25 | Discharge: 2018-07-25 | Disposition: A | Discharge status: Home / Self Care | Payer: Medicare HMO | Source: Ambulatory Visit | Attending: Orthopedic Surgery | Admitting: Orthopedic Surgery

## 2018-07-25 DIAGNOSIS — M7522 Bicipital tendinitis, left shoulder: Secondary | ICD-10-CM

## 2018-07-25 DIAGNOSIS — J189 Pneumonia, unspecified organism: Secondary | ICD-10-CM | POA: Diagnosis not present

## 2018-07-25 DIAGNOSIS — Z8249 Family history of ischemic heart disease and other diseases of the circulatory system: Secondary | ICD-10-CM | POA: Insufficient documentation

## 2018-07-25 DIAGNOSIS — M65812 Other synovitis and tenosynovitis, left shoulder: Secondary | ICD-10-CM

## 2018-07-25 DIAGNOSIS — R0602 Shortness of breath: Secondary | ICD-10-CM | POA: Diagnosis not present

## 2018-07-25 DIAGNOSIS — E039 Hypothyroidism, unspecified: Secondary | ICD-10-CM | POA: Insufficient documentation

## 2018-07-25 DIAGNOSIS — K219 Gastro-esophageal reflux disease without esophagitis: Secondary | ICD-10-CM | POA: Insufficient documentation

## 2018-07-25 DIAGNOSIS — Z91013 Allergy to seafood: Secondary | ICD-10-CM | POA: Insufficient documentation

## 2018-07-25 DIAGNOSIS — I10 Essential (primary) hypertension: Secondary | ICD-10-CM

## 2018-07-25 DIAGNOSIS — M7542 Impingement syndrome of left shoulder: Secondary | ICD-10-CM | POA: Insufficient documentation

## 2018-07-25 DIAGNOSIS — E669 Obesity, unspecified: Secondary | ICD-10-CM | POA: Insufficient documentation

## 2018-07-25 DIAGNOSIS — Z7982 Long term (current) use of aspirin: Secondary | ICD-10-CM

## 2018-07-25 DIAGNOSIS — Z6837 Body mass index (BMI) 37.0-37.9, adult: Secondary | ICD-10-CM | POA: Insufficient documentation

## 2018-07-25 DIAGNOSIS — Z87891 Personal history of nicotine dependence: Secondary | ICD-10-CM | POA: Insufficient documentation

## 2018-07-25 DIAGNOSIS — Z79899 Other long term (current) drug therapy: Secondary | ICD-10-CM | POA: Insufficient documentation

## 2018-07-25 DIAGNOSIS — Z880 Allergy status to penicillin: Secondary | ICD-10-CM

## 2018-07-25 DIAGNOSIS — M19012 Primary osteoarthritis, left shoulder: Secondary | ICD-10-CM

## 2018-07-25 DIAGNOSIS — M75122 Complete rotator cuff tear or rupture of left shoulder, not specified as traumatic: Secondary | ICD-10-CM

## 2018-07-25 HISTORY — PX: SHOULDER ARTHROSCOPY: SHX128

## 2018-07-25 HISTORY — DX: Presence of dental prosthetic device (complete) (partial): Z97.2

## 2018-07-25 SURGERY — ARTHROSCOPY, SHOULDER
Anesthesia: General | Laterality: Left | Wound class: "Clean "

## 2018-07-25 MED ORDER — LACTATED RINGERS IV SOLN
INTRAVENOUS | Status: DC
  Administered 2018-07-25 (×2): via INTRAVENOUS

## 2018-07-25 MED ORDER — BUPIVACAINE HCL (PF) 0.5 % IJ SOLN
INTRAMUSCULAR | Status: DC | PRN
  Administered 2018-07-25: 10 mL

## 2018-07-25 MED ORDER — ASPIRIN EC 325 MG PO TBEC: 325.0000 mg | DELAYED_RELEASE_TABLET | Freq: Every day | ORAL | 0 refills | Status: AC

## 2018-07-25 MED ORDER — ACETAMINOPHEN 500 MG PO TABS: 1000.0000 mg | ORAL_TABLET | Freq: Three times a day (TID) | ORAL | 2 refills | Status: AC

## 2018-07-25 MED ORDER — ACETAMINOPHEN 160 MG/5ML PO SOLN: 325.0000 mg | ORAL | Status: DC | PRN

## 2018-07-25 MED ORDER — LIDOCAINE HCL (CARDIAC) PF 100 MG/5ML IV SOSY: PREFILLED_SYRINGE | INTRAVENOUS | Status: DC | PRN

## 2018-07-25 MED ORDER — MIDAZOLAM HCL 5 MG/5ML IJ SOLN
INTRAMUSCULAR | Status: DC | PRN
  Administered 2018-07-25: 1 mg via INTRAVENOUS

## 2018-07-25 MED ORDER — LACTATED RINGERS IV SOLN
INTRAVENOUS | Status: DC | PRN
  Administered 2018-07-25: 24000 mL

## 2018-07-25 MED ORDER — ONDANSETRON HCL 4 MG/2ML IJ SOLN: 4.0000 mg | Freq: Once | INTRAMUSCULAR | Status: DC | PRN

## 2018-07-25 MED ORDER — OXYCODONE HCL 5 MG PO TABS: 5.0000 mg | ORAL_TABLET | ORAL | 0 refills | Status: DC | PRN

## 2018-07-25 MED ORDER — BUPIVACAINE LIPOSOME 1.3 % IJ SUSP
INTRAMUSCULAR | Status: DC | PRN
  Administered 2018-07-25: 20 mL

## 2018-07-25 MED ORDER — FENTANYL CITRATE (PF) 100 MCG/2ML IJ SOLN
INTRAMUSCULAR | Status: DC | PRN
  Administered 2018-07-25: 50 ug via INTRAVENOUS

## 2018-07-25 MED ORDER — GLYCOPYRROLATE 0.2 MG/ML IJ SOLN
INTRAMUSCULAR | Status: DC | PRN
  Administered 2018-07-25: 0.1 mg via INTRAVENOUS

## 2018-07-25 MED ORDER — OXYCODONE HCL 5 MG/5ML PO SOLN: 5.0000 mg | Freq: Once | ORAL | Status: DC | PRN

## 2018-07-25 MED ORDER — PROPOFOL 10 MG/ML IV BOLUS
INTRAVENOUS | Status: DC | PRN
  Administered 2018-07-25: 120 mg via INTRAVENOUS
  Administered 2018-07-25: 30 mg via INTRAVENOUS
  Administered 2018-07-25: 50 mg via INTRAVENOUS

## 2018-07-25 MED ORDER — DEXAMETHASONE SODIUM PHOSPHATE 4 MG/ML IJ SOLN
INTRAMUSCULAR | Status: DC | PRN
  Administered 2018-07-25: 4 mg via INTRAVENOUS

## 2018-07-25 MED ORDER — OXYCODONE HCL 5 MG PO TABS: 5.0000 mg | ORAL_TABLET | Freq: Once | ORAL | Status: DC | PRN

## 2018-07-25 MED ORDER — ONDANSETRON HCL 4 MG/2ML IJ SOLN
INTRAMUSCULAR | Status: DC | PRN
  Administered 2018-07-25: 4 mg via INTRAVENOUS

## 2018-07-25 MED ORDER — FENTANYL CITRATE (PF) 100 MCG/2ML IJ SOLN: 25.0000 ug | INTRAMUSCULAR | Status: DC | PRN

## 2018-07-25 MED ORDER — ONDANSETRON 4 MG PO TBDP: 4.0000 mg | ORAL_TABLET | Freq: Three times a day (TID) | ORAL | 0 refills | Status: DC | PRN

## 2018-07-25 MED ORDER — CLINDAMYCIN PHOSPHATE 900 MG/50ML IV SOLN
900.0000 mg | Freq: Once | INTRAVENOUS | Status: AC
  Administered 2018-07-25: 900 mg via INTRAVENOUS

## 2018-07-25 MED ORDER — EPHEDRINE SULFATE 50 MG/ML IJ SOLN
INTRAMUSCULAR | Status: DC | PRN
  Administered 2018-07-25: 5 mg via INTRAVENOUS
  Administered 2018-07-25 (×2): 10 mg via INTRAVENOUS
  Administered 2018-07-25: 5 mg via INTRAVENOUS

## 2018-07-25 MED ORDER — ACETAMINOPHEN 325 MG PO TABS: 325.0000 mg | ORAL_TABLET | ORAL | Status: DC | PRN

## 2018-07-25 SURGICAL SUPPLY — 52 items
ADAPTER IRRIG TUBE 2 SPIKE SOL (ADAPTER) ×6 IMPLANT
ANCHOR ALL-SUT Q-FIX 2.8 (Anchor) ×1 IMPLANT
BUR RADIUS 5.5 (BURR) ×2 IMPLANT
CANNULA 5.75X7 CRYSTAL CLEAR (CANNULA) ×2 IMPLANT
CHLORAPREP W/TINT 26ML (MISCELLANEOUS) ×2 IMPLANT
COOLER POLAR GLACIER W/PUMP (MISCELLANEOUS) ×2 IMPLANT
DERMABOND ADVANCED (GAUZE/BANDAGES/DRESSINGS) ×1
DERMABOND ADVANCED .7 DNX12 (GAUZE/BANDAGES/DRESSINGS) IMPLANT
DRAPE IMP U-DRAPE 54X76 (DRAPES) ×4 IMPLANT
DRAPE INCISE IOBAN 66X45 STRL (DRAPES) ×2 IMPLANT
DRAPE SHEET LG 3/4 BI-LAMINATE (DRAPES) ×2 IMPLANT
DRSG TEGADERM 4X4.75 (GAUZE/BANDAGES/DRESSINGS) ×3 IMPLANT
ELECT REM PT RETURN 9FT ADLT (ELECTROSURGICAL) ×2
ELECTRODE REM PT RTRN 9FT ADLT (ELECTROSURGICAL) ×1 IMPLANT
FOOTPRINT Ultra PK Suture Anchor, 5.5 mm (Anchor) ×2 IMPLANT
GAUZE PETRO XEROFOAM 1X8 (MISCELLANEOUS) ×1 IMPLANT
GAUZE SPONGE 4X4 12PLY STRL (GAUZE/BANDAGES/DRESSINGS) ×2 IMPLANT
GLOVE BIO SURGEON STRL SZ7.5 (GLOVE) ×5 IMPLANT
GLOVE BIOGEL PI IND STRL 8 (GLOVE) ×1 IMPLANT
GLOVE BIOGEL PI INDICATOR 8 (GLOVE) ×2
GOWN STRL REUS W/ TWL LRG LVL3 (GOWN DISPOSABLE) ×1 IMPLANT
GOWN STRL REUS W/TWL LRG LVL3 (GOWN DISPOSABLE) ×3
HEALICOIL REGENESORB 4.75 MM SUTURE ANCHOR WITH ON (Anchor) ×2 IMPLANT
IV LACTATED RINGER IRRG 3000ML (IV SOLUTION) ×8
IV LR IRRIG 3000ML ARTHROMATIC (IV SOLUTION) ×8 IMPLANT
KIT STABILIZATION SHOULDER (MISCELLANEOUS) ×2 IMPLANT
KIT SUTURE 2.8 Q-FIX DISP (MISCELLANEOUS) ×1 IMPLANT
KIT TURNOVER KIT A (KITS) ×2 IMPLANT
MANIFOLD NEPTUNE II (INSTRUMENTS) ×2 IMPLANT
MASK FACE SPIDER DISP (MASK) ×2 IMPLANT
MAT GRAY ABSORB FLUID 28X50 (MISCELLANEOUS) ×5 IMPLANT
PACK ARTHROSCOPY SHOULDER (MISCELLANEOUS) ×2 IMPLANT
PAD WRAPON POLAR SHDR UNIV (MISCELLANEOUS) ×1 IMPLANT
PASSER SUT CAPTURE FIRST (SUTURE) ×1 IMPLANT
SET TUBE SUCT SHAVER OUTFL 24K (TUBING) ×2 IMPLANT
SET TUBE TIP INTRA-ARTICULAR (MISCELLANEOUS) ×2 IMPLANT
STRIP CLOSURE SKIN 1/2X4 (GAUZE/BANDAGES/DRESSINGS) IMPLANT
SUT ETHILON 3-0 FS-10 30 BLK (SUTURE) ×2
SUT FIBERWIRE #2 38 T-5 BLUE (SUTURE) ×2
SUT MNCRL 4-0 (SUTURE) ×1
SUT MNCRL 4-0 27XMFL (SUTURE) ×1
SUT VIC AB 0 CT1 36 (SUTURE) ×1 IMPLANT
SUT VIC AB 2-0 CT2 27 (SUTURE) ×1 IMPLANT
SUT VICRYL AB 3-0 FS1 BRD 27IN (SUTURE) ×1 IMPLANT
SUTURE EHLN 3-0 FS-10 30 BLK (SUTURE) IMPLANT
SUTURE FIBERWR #2 38 T-5 BLUE (SUTURE) IMPLANT
SUTURE MNCRL 4-0 27XMF (SUTURE) IMPLANT
SYR 10ML LL (SYRINGE) ×2 IMPLANT
TAPE MICROFOAM 4IN (TAPE) ×1 IMPLANT
TUBING ARTHRO INFLOW-ONLY STRL (TUBING) ×2 IMPLANT
WAND HAND CNTRL MULTIVAC 90 (MISCELLANEOUS) ×2 IMPLANT
WRAPON POLAR PAD SHDR UNIV (MISCELLANEOUS) ×2

## 2018-07-25 NOTE — Anesthesia Postprocedure Evaluation (Signed)
Anesthesia Post Note  Patient: Latoya Freeman  Procedure(s) Performed: SHOULDER MINI OPEN ROTATOR CUFF REPAIR  BICEPS TENDOSIS ARTHROSCOPIC DISTAL CLAVICLE EXCISION  SUBACROMIAL DECOMP (Left )  Patient location during evaluation: PACU Anesthesia Type: General Level of consciousness: awake and alert and oriented Pain management: satisfactory to patient Vital Signs Assessment: post-procedure vital signs reviewed and stable Respiratory status: spontaneous breathing, nonlabored ventilation and respiratory function stable Cardiovascular status: blood pressure returned to baseline and stable Postop Assessment: Adequate PO intake and No signs of nausea or vomiting Anesthetic complications: no    Raliegh Ip

## 2018-07-25 NOTE — Op Note (Signed)
SURGERY DATE: 07/25/2018  PRE-OP DIAGNOSIS:  1. Left subacromial impingement 2. Left biceps tendinopathy 3. Left rotator cuff tear 4. Left acromioclavicular joint osteoarthritis  POST-OP DIAGNOSIS: 1. Left subacromial impingement 2. Left biceps tendinopathy 3. Left rotator cuff tear 4. Left acromioclavicular joint osteoarthritis  PROCEDURES:  1. Left mini-open rotator cuff repair 2. Left open biceps tenodesis 3. Left arthroscopic distal clavicle excision 4. Left arthroscopic extensive debridement of shoulder (glenohumeral and subacromial spaces) 5. Left arthroscopic subacromial decompression  SURGEON: Cato Mulligan, MD  ANESTHESIA: Gen with Exparil interscalene block  ESTIMATED BLOOD LOSS: 25cc  DRAINS:  none  TOTAL IV FLUIDS: per anesthesia   SPECIMENS: none  IMPLANTS:  - [employer] 0.97DZ Healicoil anchor - x2 Tamala [employer]hew 5.8mm Footprint anchor - x2 - [employer] Q-fix anchor - x1  OPERATIVE FINDINGS:  Examination under anesthesia: A careful examination under anesthesia was performed.  Passive range of motion was: FF: 160; ER at side: 50; ER in abduction: 95; IR in abduction: 50.  Anterior load shift: NT.  Posterior load shift: NT.  Sulcus in neutral: NT.  Sulcus in ER: NT.    Intra-operative findings: A thorough arthroscopic examination of the shoulder was performed.  The findings are: 1. Biceps tendon: tendinopathy with significant erythema 2. Superior labrum: injected with surrounding synovitis 3. Posterior labrum and capsule: normal 4. Inferior capsule and inferior recess: normal 5. Glenoid cartilage surface: normal 6. Supraspinatus attachment: full-thickness tear 7. Posterior rotator cuff attachment: normal 8. Humeral head articular cartilage: Grade 1-2 degenerative changes focally 9. Rotator interval: significant synovitis 10: Subscapularis tendon: attachment intact 11. Anterior labrum: degenerative 12. IGHL: normal  OPERATIVE REPORT:    Indications for procedure: Latoya Freeman is a 77 y.o. year old female with ~6 months of L shoulder pain that has failed non-operative management including activity modification and NSAIDs.  Clinical exam and CT arthrogram were suggestive rotator cuff tear, biceps tendinopathy, subacromial impingement, and acromioclavicular joint arthritis. There was no significant muscle atrophy of the rotator cuff on CT scan.  After discussion of risks, benefits, and alternatives to surgery, the patient elected to forego continued non-surgical management and proceed with above surgery.   Procedure in detail:  I identified Latoya Freeman in the pre-operative holding area.  I marked the operative shoulder with my initials. I reviewed the risks and benefits of the proposed surgical intervention, and the patient (and/or patient's guardian) wished to proceed. Anesthesia was then performed with an interscalene block.  The patient was transferred to the operative suite and placed in the beach chair position.    SCDs were placed on the lower extremities. Appropriate IV antibiotics were administered prior to incision. The operative upper extremity was then prepped and draped in standard fashion. A time out was performed confirming the correct extremity, correct patient, and correct procedure.   I then created a standard posterior portal with an 11 blade. The glenohumeral joint was easily entered with a blunt trochar and the arthroscope introduced. The findings of diagnostic arthroscopy are described above. I debrided degenerative tissue including the synovitic tissue about the rotator interval and anterior and superior labrum. I then coagulated the inflamed synovium to obtain hemostasis and reduce the risk of post-operative swelling using an Arthrocare radiofrequency device. I performed a biceps tenotomy using an arthroscopic scissors and used a motorized shaver to debride the stump back to a stable base.   Next, the  arthroscope was then introduced into the subacromial space. A direct  lateral portal was created with an 11-blade after spinal needle localization. An extensive subacromial bursectomy was performed using a combination of the shaver and Arthrocare wand. The entire acromial undersurface was exposed and the CA ligament was subperiosteally elevated to expose the anterior acromial hook. A 5.72mm barrel burr was used to create a flat anterior and lateral aspect of the acromion, converting it from a Type 2 to a Type 1 acromion. Care was made to keep the deltoid fascia intact.  I then turned my attention to the arthroscopic distal clavicle excision. I identified the acromioclavicular joint. Surrounding bursal tissue was debrided and the edges of the joint were identified. I used the 5.78mm barrel burr to remove the distal clavicle parallel to the edge of the acromion. I was able to fit two widths of the burr into the space between the distal clavicle and acromion, signifying that I had removed ~43mm of distal clavicle. This was confirmed by viewing anteriorly and introducing a probe with measuring marks from the lateral portal. Hemostasis was achieved with an Arthrocare wand. Fluid was evacuated from the shoulder.   A longitudinal incision from the anterolateral acromion ~6cm in length was made overlying the raphe between the anterior and middle heads of the deltoid. The raphe was identified and it was incised. The subacromial space was identified. Any remaining bursa was excised. The rotator cuff tear was identified. It was an V-shaped tear involving the supraspinatus.   We then turned our attention to the biceps tenodesis. The arm was externally rotated.  The bicipital groove was identified.  A 15 blade was used to make a cut overlying the biceps tendon, and the tendon was removed using a right angle clamp.  The base of the bicipital groove was identified and cleared of soft tissue.  A Q fix anchor was placed in the  bicipital groove.  The biceps tendon was held at the appropriate amount of tension.  One set of sutures was passed through the biceps anchor with one limb passed in a simple fashion and the second limb passed in a simple plus locking stitch pattern.  This was repeated for the other set of sutures.  This construct allowed for shuttling the biceps tendon down to the bone.  The sutures were tied and cut.  The diseased portion of the proximal biceps was then excised.  The arm was then internally rotated.  The rotator cuff footprint was cleared of soft tissue and roughened with a rongeur.  Two Healicoil anchors were placed just lateral to the articular margin. A #2 FiberWire suture was placed in a margin convergence fashion at the apex of the V to convert the tear to a U-shaped tear.  All 4 strands of the anterior anchor were passed through the anterior cuff.  All 4 strands from the posterior anchor were passed through the posterior cuff.  Two Footprint anchors were placed for the lateral row anchors with appropriate sutures passed through each anchor. This allowed for excellent reapproximation of the rotator cuff over its footprint.  The construct was stable with external and internal rotation.  The wound was thoroughly irrigated.  The deltoid split was closed with 0 Vicryl.  The subdermal layer was closed with 2-0 Vicryl.  The skin was closed with 4-0 Monocryl and Dermabond. The portals were closed with 3-0 Nylon. Xeroform was applied to the incisions. A sterile dressing was applied, followed by a Polar Care sleeve and a SlingShot shoulder immobilizer/sling. The patient awoke from anesthesia without difficulty and  was transferred to the PACU in stable condition.     COMPLICATIONS: none  DISPOSITION: plan for discharge home after recovery in PACU   POSTOPERATIVE PLAN: Remain in sling (except hygiene and elbow/wrist/hand RoM exercises as instructed by PT) x 6 weeks and NWB for this time. PT to begin 3-4 days  after surgery. Rotator cuff repair and biceps tenodesis rehab protocol.

## 2018-07-25 NOTE — Discharge Instructions (Signed)
Post-Op Instructions - Rotator Cuff Repair  1. Bracing: You will wear a shoulder immobilizer or sling for 6 weeks.   2. Driving: No driving for 3 weeks post-op. When driving, do not wear the immobilizer. Ideally, we recommend no driving for 6 weeks while sling is in place as one arm will be immobilized.   3. Activity: No active lifting for 2 months. Wrist, hand, and elbow motion only. Avoid lifting the upper arm away from the body except for hygiene. You are permitted to bend and straighten the elbow passively only (no active elbow motion). You may use your hand and wrist for typing, writing, and managing utensils (cutting food). Do not lift more than a coffee cup for 8 weeks.  When sleeping or resting, inclined positions (recliner chair or wedge pillow) and a pillow under the forearm for support may provide better comfort for up to 4 weeks.  Avoid long distance travel for 4 weeks.  Return to normal activities after rotator cuff repair repair normally takes 6 months on average. If rehab goes very well, may be able to do most activities at 4 months, except overhead or contact sports.  4. Physical Therapy: Begins 3-4 days after surgery, and proceed 1 time per week for the first 6 weeks, then 1-2 times per week from weeks 6-20 post-op.  5. Medications:  - You will be provided a prescription for narcotic pain medicine. After surgery, take 1-2 narcotic tablets every 4 hours if needed for severe pain.  - A prescription for anti-nausea medication will be provided in case the narcotic medicine causes nausea - take 1 tablet every 6 hours only if nauseated.   - Take tylenol 1000 mg (2 Extra Strength tablets or 3 regular strength) every 8 hours for pain.  May decrease or stop tylenol 5 days after surgery if you are having minimal pain. - Take ASA 325mg/day x 2 weeks to help prevent DVTs/PEs (blood clots).  - DO NOT take ANY nonsteroidal anti-inflammatory pain medications (Advil, Motrin, Ibuprofen, Aleve,  Naproxen, or Naprosyn). These medicines can inhibit healing of your shoulder repair.    If you are taking prescription medication for anxiety, depression, insomnia, muscle spasm, chronic pain, or for attention deficit disorder, you are advised that you are at a higher risk of adverse effects with use of narcotics post-op, including narcotic addiction/dependence, depressed breathing, death. If you use non-prescribed substances: alcohol, marijuana, cocaine, heroin, methamphetamines, etc., you are at a higher risk of adverse effects with use of narcotics post-op, including narcotic addiction/dependence, depressed breathing, death. You are advised that taking > 50 morphine milligram equivalents (MME) of narcotic pain medication per day results in twice the risk of overdose or death. For your prescription provided: oxycodone 5 mg - taking more than 6 tablets per day would result in > 50 morphine milligram equivalents (MME) of narcotic pain medication. Be advised that we will prescribe narcotics short-term, for acute post-operative pain only - 3 weeks for major operations such as shoulder repair/reconstruction surgeries.     6. Post-Op Appointment:  Your first post-op appointment will be 10-14 days post-op.  7. Work or School: For most, but not all procedures, we advise staying out of work or school for at least 1 to 2 weeks in order to recover from the stress of surgery and to allow time for healing.   If you need a work or school note this can be provided.   8. Smoking: If you are a smoker, you need to refrain from   smoking in the postoperative period. The nicotine in cigarettes will inhibit healing of your shoulder repair and decrease the chance of successful repair. Similarly, nicotine containing products (gum, patches) should be avoided.   Post-operative Brace: Apply and remove the brace you received as you were instructed to at the time of fitting and as described in detail as the braces  instructions for use indicate.  Wear the brace for the period of time prescribed by your physician.  The brace can be cleaned with soap and water and allowed to air dry only.  Should the brace result in increased pain, decreased feeling (numbness/tingling), increased swelling or an overall worsening of your medical condition, please contact your doctor immediately.  If an emergency situation occurs as a result of wearing the brace after normal business hours, please dial 911 and seek immediate medical attention.  Let your doctor know if you have any further questions about the brace issued to you. Refer to the shoulder sling instructions for use if you have any questions regarding the correct fit of your shoulder sling.  [facility] for Troubleshooting: [phone]  Video that illustrates how to properly use a shoulder sling: "Instructions for Proper Use of an Orthopaedic Sling" [email or web address]        General Anesthesia, Adult, Care After These instructions provide you with information about caring for yourself after your procedure. Your health care provider may also give you more specific instructions. Your treatment has been planned according to current medical practices, but problems sometimes occur. Call your health care provider if you have any problems or questions after your procedure. What can I expect after the procedure? After the procedure, it is common to have:  Vomiting.  A sore throat.  Mental slowness.  It is common to feel:  Nauseous.  Cold or shivery.  Sleepy.  Tired.  Sore or achy, even in parts of your body where you did not have surgery.  Follow these instructions at home: For at least 24 hours after the procedure:  Do not: ? Participate in activities where you could fall or become injured. ? Drive. ? Use heavy machinery. ? Drink alcohol. ? Take sleeping pills or medicines that cause drowsiness. ? Make important  decisions or sign legal documents. ? Take care of children on your own.  Rest. Eating and drinking  If you vomit, drink water, juice, or soup when you can drink without vomiting.  Drink enough fluid to keep your urine clear or pale yellow.  Make sure you have little or no nausea before eating solid foods.  Follow the diet recommended by your health care provider. General instructions  Have a responsible adult stay with you until you are awake and alert.  Return to your normal activities as told by your health care provider. Ask your health care provider what activities are safe for you.  Take over-the-counter and prescription medicines only as told by your health care provider.  If you smoke, do not smoke without supervision.  Keep all follow-up visits as told by your health care provider. This is important. Contact a health care provider if:  You continue to have nausea or vomiting at home, and medicines are not helpful.  You cannot drink fluids or start eating again.  You cannot urinate after 8-12 hours.  You develop a skin rash.  You have fever.  You have increasing redness at the site of your procedure. Get help right away if:  You have difficulty breathing.  You have chest  pain.  You have unexpected bleeding.  You feel that you are having a life-threatening or urgent problem. This information is not intended to replace advice given to you by your health care provider. Make sure you discuss any questions you have with your health care provider. Document Released: 02/14/2001 Document Revised: 04/12/2016 Document Reviewed: 10/23/2015 Elsevier Interactive Patient Education  [employer].

## 2018-07-25 NOTE — H&P (Signed)
Paper H&P to be scanned into permanent record. H&P reviewed. No significant changes noted.  

## 2018-07-25 NOTE — Anesthesia Procedure Notes (Signed)
Anesthesia Regional Block: Interscalene brachial plexus block   Pre-Anesthetic Checklist: ,, timeout performed, Correct Patient, Correct Site, Correct Laterality, Correct Procedure, Correct Position, site marked, Risks and benefits discussed,  Surgical consent,  Pre-op evaluation,  At surgeon's request and post-op pain management  Laterality: Left  Prep: chloraprep       Needles:  Injection technique: Single-shot  Needle Type: Stimiplex     Needle Length: 10cm  Needle Gauge: 21     Additional Needles:   Narrative:  Start time: 07/25/2018 12:17 PM End time: 07/25/2018 12:23 PM Injection made incrementally with aspirations every 5 mL.  Performed by: Personally  Anesthesiologist: Ronelle Nigh, MD  Additional Notes: Functioning IV was confirmed and monitors applied. Ultrasound guidance: relevant anatomy identified, needle position confirmed, local anesthetic spread visualized around nerve(s)., vascular puncture avoided.  Image printed for medical record.  Negative aspiration and no paresthesias; incremental administration of local anesthetic. The patient tolerated the procedure well. Vitals signes recorded in RN notes.

## 2018-07-25 NOTE — Progress Notes (Addendum)
Assisted Clance Boll ANMD with left, ultrasound guided, interscalene  block. Side rails up, monitors on throughout procedure. See vital signs in flow sheet. Tolerated Procedure well. Exparel bracelet place on right wrist by Dr. Junious Dresser with date and time of injection.

## 2018-07-25 NOTE — Anesthesia Procedure Notes (Signed)
Procedure Name: LMA Insertion Date/Time: 07/25/2018 12:47 PM Performed by: Cameron Ali, CRNA Pre-anesthesia Checklist: Patient identified, Emergency Drugs available, Suction available, Timeout performed and Patient being monitored Patient Re-evaluated:Patient Re-evaluated prior to induction Oxygen Delivery Method: Circle system utilized Preoxygenation: Pre-oxygenation with 100% oxygen Induction Type: IV induction LMA: LMA inserted LMA Size: 3.0 Number of attempts: 1 Placement Confirmation: positive ETCO2 and breath sounds checked- equal and bilateral Tube secured with: Tape Dental Injury: Teeth and Oropharynx as per pre-operative assessment

## 2018-07-25 NOTE — Transfer of Care (Signed)
Immediate Anesthesia Transfer of Care Note  Patient: Latoya Freeman  Procedure(s) Performed: SHOULDER MINI OPEN ROTATOR CUFF REPAIR  BICEPS TENDOSIS ARTHROSCOPIC DISTAL CLAVICLE EXCISION  SUBACROMIAL DECOMP (Left )  Patient Location: PACU  Anesthesia Type: General LMA  Level of Consciousness: awake, alert  and patient cooperative  Airway and Oxygen Therapy: Patient Spontanous Breathing and Patient connected to supplemental oxygen  Post-op Assessment: Post-op Vital signs reviewed, Patient's Cardiovascular Status Stable, Respiratory Function Stable, Patent Airway and No signs of Nausea or vomiting  Post-op Vital Signs: Reviewed and stable  Complications: No apparent anesthesia complications

## 2018-07-27 ENCOUNTER — Emergency Department: Payer: Medicare HMO

## 2018-07-27 ENCOUNTER — Encounter: Payer: Self-pay | Admitting: Orthopedic Surgery

## 2018-07-27 ENCOUNTER — Other Ambulatory Visit: Payer: Self-pay

## 2018-07-27 ENCOUNTER — Inpatient Hospital Stay
Admission: EM | Admit: 2018-07-27 | Discharge: 2018-07-30 | DRG: 166 | Disposition: A | Discharge status: Home Health | Payer: Medicare HMO | Attending: Internal Medicine | Admitting: Internal Medicine

## 2018-07-27 DIAGNOSIS — M75122 Complete rotator cuff tear or rupture of left shoulder, not specified as traumatic: Secondary | ICD-10-CM | POA: Diagnosis present

## 2018-07-27 DIAGNOSIS — Y95 Nosocomial condition: Secondary | ICD-10-CM | POA: Diagnosis present

## 2018-07-27 DIAGNOSIS — Z972 Presence of dental prosthetic device (complete) (partial): Secondary | ICD-10-CM

## 2018-07-27 DIAGNOSIS — M19012 Primary osteoarthritis, left shoulder: Secondary | ICD-10-CM | POA: Diagnosis present

## 2018-07-27 DIAGNOSIS — Z9049 Acquired absence of other specified parts of digestive tract: Secondary | ICD-10-CM | POA: Diagnosis not present

## 2018-07-27 DIAGNOSIS — R0602 Shortness of breath: Secondary | ICD-10-CM

## 2018-07-27 DIAGNOSIS — K219 Gastro-esophageal reflux disease without esophagitis: Secondary | ICD-10-CM | POA: Diagnosis present

## 2018-07-27 DIAGNOSIS — Z880 Allergy status to penicillin: Secondary | ICD-10-CM

## 2018-07-27 DIAGNOSIS — Z853 Personal history of malignant neoplasm of breast: Secondary | ICD-10-CM

## 2018-07-27 DIAGNOSIS — M7542 Impingement syndrome of left shoulder: Secondary | ICD-10-CM | POA: Diagnosis present

## 2018-07-27 DIAGNOSIS — I10 Essential (primary) hypertension: Secondary | ICD-10-CM | POA: Diagnosis present

## 2018-07-27 DIAGNOSIS — M65812 Other synovitis and tenosynovitis, left shoulder: Secondary | ICD-10-CM | POA: Diagnosis present

## 2018-07-27 DIAGNOSIS — J9601 Acute respiratory failure with hypoxia: Secondary | ICD-10-CM | POA: Diagnosis present

## 2018-07-27 DIAGNOSIS — E669 Obesity, unspecified: Secondary | ICD-10-CM | POA: Diagnosis present

## 2018-07-27 DIAGNOSIS — Z79899 Other long term (current) drug therapy: Secondary | ICD-10-CM

## 2018-07-27 DIAGNOSIS — E039 Hypothyroidism, unspecified: Secondary | ICD-10-CM | POA: Diagnosis present

## 2018-07-27 DIAGNOSIS — A419 Sepsis, unspecified organism: Secondary | ICD-10-CM

## 2018-07-27 DIAGNOSIS — Z87891 Personal history of nicotine dependence: Secondary | ICD-10-CM

## 2018-07-27 DIAGNOSIS — R7981 Abnormal blood-gas level: Secondary | ICD-10-CM

## 2018-07-27 DIAGNOSIS — Z91013 Allergy to seafood: Secondary | ICD-10-CM | POA: Diagnosis not present

## 2018-07-27 DIAGNOSIS — Z7982 Long term (current) use of aspirin: Secondary | ICD-10-CM | POA: Diagnosis not present

## 2018-07-27 DIAGNOSIS — Z91030 Bee allergy status: Secondary | ICD-10-CM | POA: Diagnosis not present

## 2018-07-27 DIAGNOSIS — M7522 Bicipital tendinitis, left shoulder: Secondary | ICD-10-CM | POA: Diagnosis present

## 2018-07-27 DIAGNOSIS — Z79891 Long term (current) use of opiate analgesic: Secondary | ICD-10-CM | POA: Diagnosis not present

## 2018-07-27 DIAGNOSIS — Z6837 Body mass index (BMI) 37.0-37.9, adult: Secondary | ICD-10-CM | POA: Diagnosis not present

## 2018-07-27 DIAGNOSIS — J189 Pneumonia, unspecified organism: Principal | ICD-10-CM | POA: Diagnosis present

## 2018-07-27 DIAGNOSIS — Z79890 Hormone replacement therapy (postmenopausal): Secondary | ICD-10-CM | POA: Diagnosis not present

## 2018-07-27 LAB — COMPREHENSIVE METABOLIC PANEL
ALT: 75 U/L — ABNORMAL HIGH (ref 0–44)
ANION GAP: 14 (ref 5–15)
AST: 91 U/L — AB (ref 15–41)
Albumin: 3.8 g/dL (ref 3.5–5.0)
Alkaline Phosphatase: 97 U/L (ref 38–126)
BILIRUBIN TOTAL: 0.8 mg/dL (ref 0.3–1.2)
BUN: 17 mg/dL (ref 8–23)
CHLORIDE: 96 mmol/L — AB (ref 98–111)
CO2: 26 mmol/L (ref 22–32)
Calcium: 9.5 mg/dL (ref 8.9–10.3)
Creatinine, Ser: 1.81 mg/dL — ABNORMAL HIGH (ref 0.44–1.00)
GFR calc Af Amer: 30 mL/min — ABNORMAL LOW (ref >60)
GFR calc non Af Amer: 26 mL/min — ABNORMAL LOW (ref >60)
GLUCOSE: 103 mg/dL — AB (ref 70–99)
POTASSIUM: 3.7 mmol/L (ref 3.5–5.1)
Sodium: 136 mmol/L (ref 135–145)
TOTAL PROTEIN: 7.5 g/dL (ref 6.5–8.1)

## 2018-07-27 LAB — URINALYSIS, COMPLETE (UACMP) WITH MICROSCOPIC
BACTERIA UA: NONE SEEN
BILIRUBIN URINE: NEGATIVE
GLUCOSE, UA: NEGATIVE mg/dL
HGB URINE DIPSTICK: NEGATIVE
KETONES UR: NEGATIVE mg/dL
LEUKOCYTES UA: NEGATIVE
NITRITE: NEGATIVE
Protein, ur: NEGATIVE mg/dL
Specific Gravity, Urine: 1.017 (ref 1.005–1.030)
pH: 5 (ref 5.0–8.0)

## 2018-07-27 LAB — CBC WITH DIFFERENTIAL/PLATELET
BASOS ABS: 0.1 10*3/uL (ref 0–0.1)
Basophils Relative: 1 %
Eosinophils Absolute: 0 10*3/uL (ref 0–0.7)
Eosinophils Relative: 0 %
HEMATOCRIT: 35 % (ref 35.0–47.0)
HEMOGLOBIN: 12.5 g/dL (ref 12.0–16.0)
LYMPHS PCT: 21 %
Lymphs Abs: 2.6 10*3/uL (ref 1.0–3.6)
MCH: 30.8 pg (ref 26.0–34.0)
MCHC: 35.8 g/dL (ref 32.0–36.0)
MCV: 86.1 fL (ref 80.0–100.0)
Monocytes Absolute: 0.9 10*3/uL (ref 0.2–0.9)
Monocytes Relative: 8 %
NEUTROS ABS: 8.7 10*3/uL — AB (ref 1.4–6.5)
NEUTROS PCT: 70 %
Platelets: 296 10*3/uL (ref 150–440)
RBC: 4.07 MIL/uL (ref 3.80–5.20)
RDW: 13.3 % (ref 11.5–14.5)
WBC: 12.3 10*3/uL — ABNORMAL HIGH (ref 3.6–11.0)

## 2018-07-27 LAB — PROTIME-INR
INR: 1.02
PROTHROMBIN TIME: 13.3 s (ref 11.4–15.2)

## 2018-07-27 LAB — LACTIC ACID, PLASMA: Lactic Acid, Venous: 3.7 mmol/L (ref 0.5–1.9)

## 2018-07-27 MED ORDER — LOSARTAN POTASSIUM 50 MG PO TABS
50.0000 mg | ORAL_TABLET | Freq: Every day | ORAL | Status: DC
  Administered 2018-07-28 – 2018-07-30 (×3): 50 mg via ORAL
  Filled 2018-07-27 (×3): qty 1

## 2018-07-27 MED ORDER — ONDANSETRON HCL 4 MG/2ML IJ SOLN: 4.0000 mg | Freq: Four times a day (QID) | INTRAMUSCULAR | Status: DC | PRN

## 2018-07-27 MED ORDER — SODIUM CHLORIDE 0.9 % IV SOLN
2.0000 g | INTRAVENOUS | Status: DC
  Filled 2018-07-27: qty 2

## 2018-07-27 MED ORDER — ACETAMINOPHEN 650 MG RE SUPP: 650.0000 mg | Freq: Four times a day (QID) | RECTAL | Status: DC | PRN

## 2018-07-27 MED ORDER — ACETAMINOPHEN 500 MG PO TABS
1000.0000 mg | ORAL_TABLET | Freq: Once | ORAL | Status: AC
  Administered 2018-07-27: 1000 mg via ORAL

## 2018-07-27 MED ORDER — ACETAMINOPHEN 325 MG PO TABS
650.0000 mg | ORAL_TABLET | Freq: Four times a day (QID) | ORAL | Status: DC | PRN
  Administered 2018-07-29 (×3): 650 mg via ORAL
  Filled 2018-07-27 (×3): qty 2

## 2018-07-27 MED ORDER — PANTOPRAZOLE SODIUM 40 MG PO TBEC
40.0000 mg | DELAYED_RELEASE_TABLET | Freq: Every day | ORAL | Status: DC
  Administered 2018-07-28 – 2018-07-30 (×3): 40 mg via ORAL
  Filled 2018-07-27 (×3): qty 1

## 2018-07-27 MED ORDER — AMLODIPINE BESYLATE 5 MG PO TABS
5.0000 mg | ORAL_TABLET | Freq: Every day | ORAL | Status: DC
  Administered 2018-07-28 – 2018-07-30 (×3): 5 mg via ORAL
  Filled 2018-07-27 (×3): qty 1

## 2018-07-27 MED ORDER — VANCOMYCIN HCL IN DEXTROSE 1-5 GM/200ML-% IV SOLN
1000.0000 mg | Freq: Once | INTRAVENOUS | Status: AC
  Administered 2018-07-27: 1000 mg via INTRAVENOUS
  Filled 2018-07-27: qty 200

## 2018-07-27 MED ORDER — POTASSIUM CHLORIDE CRYS ER 20 MEQ PO TBCR
20.0000 meq | EXTENDED_RELEASE_TABLET | Freq: Every day | ORAL | Status: DC
  Administered 2018-07-28 – 2018-07-30 (×3): 20 meq via ORAL
  Filled 2018-07-27 (×3): qty 1

## 2018-07-27 MED ORDER — CITALOPRAM HYDROBROMIDE 20 MG PO TABS
10.0000 mg | ORAL_TABLET | Freq: Every day | ORAL | Status: DC
  Administered 2018-07-28 – 2018-07-29 (×2): 10 mg via ORAL
  Filled 2018-07-27 (×2): qty 1

## 2018-07-27 MED ORDER — DOCUSATE SODIUM 100 MG PO CAPS
100.0000 mg | ORAL_CAPSULE | Freq: Two times a day (BID) | ORAL | Status: DC
  Administered 2018-07-28 – 2018-07-30 (×5): 100 mg via ORAL
  Filled 2018-07-27 (×5): qty 1

## 2018-07-27 MED ORDER — LEVOTHYROXINE SODIUM 50 MCG PO TABS
75.0000 ug | ORAL_TABLET | Freq: Every day | ORAL | Status: DC
  Administered 2018-07-28 – 2018-07-30 (×3): 75 ug via ORAL
  Filled 2018-07-27 (×3): qty 2

## 2018-07-27 MED ORDER — SODIUM CHLORIDE 0.9 % IV BOLUS
500.0000 mL | Freq: Once | INTRAVENOUS | Status: AC
  Administered 2018-07-27: 500 mL via INTRAVENOUS

## 2018-07-27 MED ORDER — SODIUM CHLORIDE 0.9 % IV SOLN
2.0000 g | Freq: Once | INTRAVENOUS | Status: AC
  Administered 2018-07-27: 2 g via INTRAVENOUS
  Filled 2018-07-27: qty 2

## 2018-07-27 MED ORDER — ONDANSETRON HCL 4 MG PO TABS: 4.0000 mg | ORAL_TABLET | Freq: Four times a day (QID) | ORAL | Status: DC | PRN

## 2018-07-27 MED ORDER — SODIUM CHLORIDE 0.9 % IV SOLN
INTRAVENOUS | Status: DC
  Administered 2018-07-28: 01:00:00 via INTRAVENOUS

## 2018-07-27 MED ORDER — DIPHENHYDRAMINE HCL 25 MG PO CAPS: 25.0000 mg | ORAL_CAPSULE | Freq: Four times a day (QID) | ORAL | Status: DC | PRN

## 2018-07-27 MED ORDER — OXYCODONE HCL 5 MG PO TABS
5.0000 mg | ORAL_TABLET | ORAL | Status: DC | PRN
  Administered 2018-07-28 (×4): 5 mg via ORAL
  Filled 2018-07-27 (×4): qty 1

## 2018-07-27 MED ORDER — HEPARIN SODIUM (PORCINE) 5000 UNIT/ML IJ SOLN
5000.0000 [IU] | Freq: Three times a day (TID) | INTRAMUSCULAR | Status: DC
  Administered 2018-07-28 – 2018-07-29 (×4): 5000 [IU] via SUBCUTANEOUS
  Filled 2018-07-27 (×4): qty 1

## 2018-07-27 MED ORDER — ACETAMINOPHEN 500 MG PO TABS
ORAL_TABLET | ORAL | Status: AC
  Filled 2018-07-27: qty 2

## 2018-07-27 MED ORDER — ALBUTEROL SULFATE (2.5 MG/3ML) 0.083% IN NEBU
2.5000 mg | INHALATION_SOLUTION | Freq: Once | RESPIRATORY_TRACT | Status: AC
  Administered 2018-07-27: 2.5 mg via RESPIRATORY_TRACT

## 2018-07-27 MED ORDER — ALBUTEROL SULFATE (2.5 MG/3ML) 0.083% IN NEBU
INHALATION_SOLUTION | RESPIRATORY_TRACT | Status: AC
  Filled 2018-07-27: qty 3

## 2018-07-27 MED ORDER — HYDROCHLOROTHIAZIDE 25 MG PO TABS
25.0000 mg | ORAL_TABLET | Freq: Every day | ORAL | Status: DC
  Administered 2018-07-28 – 2018-07-30 (×3): 25 mg via ORAL
  Filled 2018-07-27 (×3): qty 1

## 2018-07-27 MED ORDER — VANCOMYCIN HCL IN DEXTROSE 1-5 GM/200ML-% IV SOLN
1000.0000 mg | INTRAVENOUS | Status: DC
  Administered 2018-07-28: 1000 mg via INTRAVENOUS
  Filled 2018-07-27 (×2): qty 200

## 2018-07-27 NOTE — ED Provider Notes (Signed)
[facility] Emergency Department Provider Note    First MD Initiated Contact with Patient 07/27/18 2004     (approximate)  I have reviewed the triage vital signs and the nursing notes.   HISTORY  Chief Complaint Fever and Cough    HPI Latoya Freeman is a 77 y.o. female extensive past medical history presents to the ER 7 days after left shoulder surgery with worsening shortness of breath fatigue.  Also complained of generalized weakness and had several episodes of cough.  States she is coughing up black sputum.  Has had chills and low-grade temperature at home.  Does not wear home oxygen.  Denies any history of COPD.   Does arrive to the ER with acute respiratory failure with hypoxia requiring supplemental oxygen.   Past Medical History:  Diagnosis Date  . Aneurysm ([place]) 1980  . Breast cancer ([place]) 2013  . Cancer ([place])   . GERD (gastroesophageal reflux disease)   . Hypertension 1980  . Hypothyroidism 1999  . Personal history of malignant neoplasm of breast    LF Breast   . Shingles   . Wears dentures    full upper and lower   Family History  Problem Relation Age of Onset  . Breast cancer Neg Hx    Past Surgical History:  Procedure Laterality Date  . BRAIN SURGERY    . BREAST CYST ASPIRATION Left 2005  . BREAST EXCISIONAL BIOPSY Left 2013   atypical ductal hyperplasia  . BREAST SURGERY  2013   LF Breast Wide EXC   . CHOLECYSTECTOMY  2004  . COLONOSCOPY    . COLONOSCOPY WITH PROPOFOL N/A 01/31/2018   Procedure: COLONOSCOPY WITH PROPOFOL;  Surgeon: Lollie Sails, MD;  Location[facility] ENDOSCOPY;  Service: Endoscopy;  Laterality: N/A;  . ESOPHAGOGASTRODUODENOSCOPY N/A 01/31/2018   Procedure: ESOPHAGOGASTRODUODENOSCOPY (EGD);  Surgeon: Lollie Sails, MD;  Location: [facility] ENDOSCOPY;  Service: Endoscopy;  Laterality: N/A;  . SHOULDER ARTHROSCOPY Left 07/25/2018   Procedure: SHOULDER MINI OPEN ROTATOR CUFF REPAIR  BICEPS TENDOSIS ARTHROSCOPIC  DISTAL CLAVICLE EXCISION  SUBACROMIAL DECOMP;  Surgeon: Leim Fabry, MD;  Location: [place];  Service: Orthopedics;  Laterality: Left;  Scissors & NEPHEW HEAD COIL ANCHOR FOOTPRINT ANCHOR QFIX ANCHOR   There are no active problems to display for this patient.     Prior to Admission medications   Medication Sig Start Date End Date Taking? Authorizing Provider  acetaminophen (TYLENOL) 500 MG tablet Take 2 tablets (1,000 mg total) by mouth every 8 (eight) hours. Patient taking differently: Take 1,000 mg by mouth every 4 (four) hours.  07/25/18 07/25/19 Yes Leim Fabry, MD  amLODipine (NORVASC) 5 MG tablet Take 5 mg by mouth daily.  02/07/13  Yes [provider]  citalopram (CELEXA) 10 MG tablet Take 10 mg by mouth at bedtime.    Yes [provider]  Cyanocobalamin (VITAMIN B-12 PO) Take by mouth daily.   Yes [provider]  hydrochlorothiazide (HYDRODIURIL) 25 MG tablet Take 25 mg by mouth daily.  01/25/13  Yes [provider]  levothyroxine (SYNTHROID, LEVOTHROID) 75 MCG tablet Take 75 mcg by mouth daily.   Yes [provider]  losartan (COZAAR) 50 MG tablet Take 50 mg by mouth daily.    Yes [provider]  omeprazole (PRILOSEC) 20 MG capsule Take 20 mg by mouth daily.   Yes [provider]  ondansetron (ZOFRAN ODT) 4 MG disintegrating tablet Take 1 tablet (4 mg  total) by mouth every 8 (eight) hours as needed for nausea or vomiting. 07/25/18  Yes Leim Fabry, MD  oxyCODONE (ROXICODONE) 5 MG immediate release tablet Take 1-2 tablets (5-10 mg total) by mouth every 4 (four) hours as needed (pain). 07/25/18 07/25/19 Yes Leim Fabry, MD  potassium chloride (K-DUR,KLOR-CON) 10 MEQ tablet Take 20 mEq by mouth daily. 07/12/18  Yes [provider]  aspirin EC 325 MG tablet Take 1 tablet (325 mg total) by mouth daily for 14 days. Patient not taking: Reported on 07/27/2018 07/25/18 08/08/18  Leim Fabry, MD    diphenhydrAMINE (BENADRYL) 25 mg capsule Take 1 capsule (25 mg total) by mouth every 6 (six) hours as needed (allergy symptoms). 06/04/15 07/20/18  Loney Hering, MD    Allergies Bee venom; Shellfish allergy; Tamoxifen; and Penicillins    Social History Social History   Tobacco Use  . Smoking status: Former Smoker    Packs/day: 1.50    Years: 20.00    Pack years: 30.00    Last attempt to quit: 1990    Years since quitting: 29.6  . Smokeless tobacco: Never Used  Substance Use Topics  . Alcohol use: No  . Drug use: No    Review of Systems Patient denies headaches, rhinorrhea, blurry vision, numbness, shortness of breath, chest pain, edema, cough, abdominal pain, nausea, vomiting, diarrhea, dysuria, fevers, rashes or hallucinations unless otherwise stated above in HPI. ____________________________________________   PHYSICAL EXAM:  VITAL SIGNS: Vitals:   07/27/18 2156 07/27/18 2200  BP: 134/79 138/67  Pulse:  70  Resp:    Temp:    SpO2:  90%    Constitutional: Alert and oriented.  Ill-appearing. Eyes: Conjunctivae are normal.  Head: Atraumatic. Nose: No congestion/rhinnorhea. Mouth/Throat: Mucous membranes are moist.   Neck: No stridor. Painless ROM.  Cardiovascular: Normal rate, regular rhythm. Grossly normal heart sounds.  Good peripheral circulation. Respiratory: tachypnea with deminished bibasilar breathsounds.   Gastrointestinal: Soft and nontender. No distention. No abdominal bruits. No CVA tenderness. Genitourinary:  Musculoskeletal: Left shoulder apper, c/d/iNo lower extremity tenderness nor edema.  No joint effusions. Neurologic:  Normal speech and language. No gross focal neurologic deficits are appreciated. No facial droop Skin:  Skin is warm, dry and intact. No rash noted. Psychiatric: Mood and affect are normal. Speech and behavior are normal.  ____________________________________________   LABS (all labs ordered are listed, but only abnormal  results are displayed)  Results for orders placed or performed during the hospital encounter of 07/27/18 (from the past 24 hour(s))  Lactic acid, plasma     Status: Abnormal   Collection Time: 07/27/18  8:17 PM  Result Value Ref Range   Lactic Acid, Venous 3.7 (HH) 0.5 - 1.9 mmol/L  Urinalysis, Complete w Microscopic     Status: Abnormal   Collection Time: 07/27/18  8:17 PM  Result Value Ref Range   Color, Urine YELLOW (A) YELLOW   APPearance HAZY (A) CLEAR   Specific Gravity, Urine 1.017 1.005 - 1.030   pH 5.0 5.0 - 8.0   Glucose, UA NEGATIVE NEGATIVE mg/dL   Hgb urine dipstick NEGATIVE NEGATIVE   Bilirubin Urine NEGATIVE NEGATIVE   Ketones, ur NEGATIVE NEGATIVE mg/dL   Protein, ur NEGATIVE NEGATIVE mg/dL   Nitrite NEGATIVE NEGATIVE   Leukocytes, UA NEGATIVE NEGATIVE   RBC / HPF 0-5 0 - 5 RBC/hpf   WBC, UA 11-20 0 - 5 WBC/hpf   Bacteria, UA NONE SEEN NONE SEEN   Squamous Epithelial / LPF 0-5 0 -  5   Mucus PRESENT    Hyaline Casts, UA PRESENT   Comprehensive metabolic panel     Status: Abnormal   Collection Time: 07/27/18  8:18 PM  Result Value Ref Range   Sodium 136 135 - 145 mmol/L   Potassium 3.7 3.5 - 5.1 mmol/L   Chloride 96 (L) 98 - 111 mmol/L   CO2 26 22 - 32 mmol/L   Glucose, Bld 103 (H) 70 - 99 mg/dL   BUN 17 8 - 23 mg/dL   Creatinine, Ser 1.81 (H) 0.44 - 1.00 mg/dL   Calcium 9.5 8.9 - 10.3 mg/dL   Total Protein 7.5 6.5 - 8.1 g/dL   Albumin 3.8 3.5 - 5.0 g/dL   AST 91 (H) 15 - 41 U/L   ALT 75 (H) 0 - 44 U/L   Alkaline Phosphatase 97 38 - 126 U/L   Total Bilirubin 0.8 0.3 - 1.2 mg/dL   GFR calc non Af Amer 26 (L) >60 mL/min   GFR calc Af Amer 30 (L) >60 mL/min   Anion gap 14 5 - 15  CBC with Differential     Status: Abnormal   Collection Time: 07/27/18  8:18 PM  Result Value Ref Range   WBC 12.3 (H) 3.6 - 11.0 K/uL   RBC 4.07 3.80 - 5.20 MIL/uL   Hemoglobin 12.5 12.0 - 16.0 g/dL   HCT 35.0 35.0 - 47.0 %   MCV 86.1 80.0 - 100.0 fL   MCH 30.8 26.0 - 34.0  pg   MCHC 35.8 32.0 - 36.0 g/dL   RDW 13.3 11.5 - 14.5 %   Platelets 296 150 - 440 K/uL   Neutrophils Relative % 70 %   Neutro Abs 8.7 (H) 1.4 - 6.5 K/uL   Lymphocytes Relative 21 %   Lymphs Abs 2.6 1.0 - 3.6 K/uL   Monocytes Relative 8 %   Monocytes Absolute 0.9 0.2 - 0.9 K/uL   Eosinophils Relative 0 %   Eosinophils Absolute 0.0 0 - 0.7 K/uL   Basophils Relative 1 %   Basophils Absolute 0.1 0 - 0.1 K/uL  Protime-INR     Status: None   Collection Time: 07/27/18  9:10 PM  Result Value Ref Range   Prothrombin Time 13.3 11.4 - 15.2 seconds   INR 1.02    ____________________________________________  EKG My review and personal interpretation at Time: 14:49   Indication: sob  Rate: 65  Rhythm: sinus Axis: normal Other: normal intervals, no stemi ____________________________________________  RADIOLOGY  I personally reviewed all radiographic images ordered to evaluate for the above acute complaints and reviewed radiology reports and findings.  These findings were personally discussed with the patient.  Please see medical record for radiology report.  ____________________________________________   PROCEDURES  Procedure(s) performed:  .Critical Care Performed by: Merlyn Lot, MD Authorized by: Merlyn Lot, MD   Critical care provider statement:    Critical care time (minutes):  35   Critical care time was exclusive of:  Separately billable procedures and treating other patients   Critical care was necessary to treat or prevent imminent or life-threatening deterioration of the following conditions:  Respiratory failure and sepsis   Critical care was time spent personally by me on the following activities:  Development of treatment plan with patient or surrogate, discussions with consultants, evaluation of patient's response to treatment, examination of patient, obtaining history from patient or surrogate, ordering and performing treatments and interventions, ordering  and review of laboratory studies, ordering and review of  radiographic studies, pulse oximetry, re-evaluation of patient's condition and review of old [place] performed: yes ____________________________________________   INITIAL IMPRESSION / ASSESSMENT AND PLAN / ED COURSE  Pertinent labs & imaging results that were available during my care of the patient were reviewed by me and considered in my medical decision making (see chart for details).   DDX: Asthma, copd, CHF, pna, ptx, malignancy, Pe, anemia   SCHARLENE CATALINA is a 77 y.o. who presents to the ED with symptoms as described above.  Patient with low-grade temperature and found to be acutely hypoxic requiring supplemental oxygen.  Blood work will be sent for evaluation of the above differential.  Presentation certainly concerning for pneumonia.  Possible PE but given her description of symptoms without any pleuritic pain or significant tachycardia seems more concerning for pneumonia or heart failure.  Possible COPD.  Chest x-ray shows no evidence of pneumothorax but does show evidence of pneumonia.  Will send lactate.  Will provide gentle IV hydration.  Clinical Course as of Jul 28 2315  Thu Jul 27, 2018  2200 Patient with no evidence of UTI.  Lactate did return at 3.7.  I do believe this partially secondary to her hypoxia.  No hypotension.  Will give gentle IV hydration as she has cardiomegaly with vascular congestion.  Did not have any significant response to albuterol.  Discussed case with hospitalist who kindly agrees to admit patient for further medical management.  Have discussed with the patient and available family all diagnostics and treatments performed thus far and all questions were answered to the best of my ability. The patient demonstrates understanding and agreement with plan.    [PR]    Clinical Course User Index [PR] Merlyn Lot, MD     As part of my medical decision making, I reviewed the  following data within the [facility] notes reviewed and incorporated, Labs reviewed, notes from prior ED visits.  ____________________________________________   FINAL CLINICAL IMPRESSION(S) / ED DIAGNOSES  Final diagnoses:  Acute respiratory failure with hypoxia ([place])  Pneumonia due to infectious organism, unspecified laterality, unspecified part of lung  Sepsis, due to unspecified organism [facility])      NEW MEDICATIONS STARTED DURING THIS VISIT:  New Prescriptions   No medications on file     Note:  This document was prepared using Dragon voice recognition software and may include unintentional dictation errors.    Merlyn Lot, MD 07/27/18 [phone]

## 2018-07-27 NOTE — ED Notes (Signed)
Pt oxygen levels have dropped to 99% on 2L while eating. Pt is alert and reports no difficulty. O2 increased to 4L while eating. Will continue to monitor.

## 2018-07-27 NOTE — ED Notes (Signed)
Report off to [employer].

## 2018-07-27 NOTE — ED Notes (Signed)
Pt had left rotator cuff shoulder surgery 3 days ago.  Today, pt with a cough, fever and low oxygen sats.  Pt placed on 4 liters oxygen [place]. sats at 91 on oxygen.  Family reports pt may be taking more pain meds than needed.  Pt has a shoulder sling/immoblizer on left arm

## 2018-07-27 NOTE — ED Notes (Signed)
Pt voided and missed the bedpan.  No ua sample at this time.

## 2018-07-27 NOTE — ED Notes (Signed)
Amy RN, aware of recollect light blue

## 2018-07-27 NOTE — ED Triage Notes (Signed)
Pt arrives to ED via POV from home with c/o fever and cough x2 days. Pt reports left shoulder surgery on Tuesday and taking medications as prescribed. Pt reports temp at home of 101.2, but may be lower at this time d/t taking Tylenol PTA. Pt also reports N/V, but denies diarrhea. Pt does not use home O2, but pt's sats in Triage noted to be 84% on RA with minimal improvement to 91-92% on 3L.

## 2018-07-27 NOTE — ED Notes (Signed)
Pt given a sandwich tray and water to drink. Pt given scheduled tylenol post surgery. Pt denies pain now. Daughter at bedside.

## 2018-07-27 NOTE — Progress Notes (Signed)
Pharmacy Antibiotic Note  Latoya Freeman is a 77 y.o. female admitted on 07/27/2018 with pneumonia.  Pharmacy has been consulted for vancomycin and cefepime dosing.  Plan: DW 64kg  Vd 45L kei 0.027 hr-1  T1/2 26 hours Vancomycin 1 gram q 36 hours ordered with stacked dosing. Level before 4th dose. Goal trough 15-20  Cefepime 2 grams q 24 hours ordered  Height: 5\' 1"  (154.9 cm) Weight: 197 lb (89.4 kg) IBW/kg (Calculated) : 47.8  Temp (24hrs), Avg:99 F (37.2 C), Min:98.7 F (37.1 C), Max:99.2 F (37.3 C)  Recent Labs  Lab 07/27/18 2017 07/27/18 2018  WBC  --  12.3*  CREATININE  --  1.81*  LATICACIDVEN 3.7*  --     Estimated Creatinine Clearance: 26.9 mL/min (A) (by C-G formula based on SCr of 1.81 mg/dL (H)).    Allergies  Allergen Reactions  . Bee Venom Swelling  . Shellfish Allergy Swelling  . Tamoxifen Hives  . Penicillins Hives, Rash and Other (See Comments)    Has patient had a PCN reaction causing immediate rash, facial/tongue/throat swelling, SOB or lightheadedness with hypotension: Unknown Has patient had a PCN reaction causing severe rash involving mucus membranes or skin necrosis: Unknown Has patient had a PCN reaction that required hospitalization: Unknown Has patient had a PCN reaction occurring within the last 10 years: No If all of the above answers are "NO", then may proceed with Cephalosporin use.     Antimicrobials this admission: Vancomycin, cefepime 9/5  >>    >>   Dose adjustments this admission:   Microbiology results: 9/5 BCx: pending 9/5 MRSA PCR: pending      9/5 UA: (-) 9/5 CXR: mild bibasilar opacities Thank you for allowing pharmacy to be a part of this patient's care.  Kearstyn Avitia S 07/27/2018 10:56 PM

## 2018-07-28 ENCOUNTER — Encounter: Payer: Self-pay | Admitting: Pharmacist

## 2018-07-28 LAB — TSH: TSH: 0.618 u[IU]/mL (ref 0.350–4.500)

## 2018-07-28 LAB — MRSA PCR SCREENING: MRSA by PCR: NEGATIVE

## 2018-07-28 LAB — LACTIC ACID, PLASMA: Lactic Acid, Venous: 1.3 mmol/L (ref 0.5–1.9)

## 2018-07-28 MED ORDER — SODIUM CHLORIDE 0.9 % IV SOLN
1.0000 g | INTRAVENOUS | Status: DC
  Administered 2018-07-28 – 2018-07-29 (×2): 1 g via INTRAVENOUS
  Filled 2018-07-28 (×2): qty 1

## 2018-07-28 MED ORDER — MORPHINE SULFATE (PF) 2 MG/ML IV SOLN: 2.0000 mg | INTRAVENOUS | Status: DC | PRN

## 2018-07-28 MED ORDER — GUAIFENESIN ER 600 MG PO TB12
600.0000 mg | ORAL_TABLET | Freq: Two times a day (BID) | ORAL | Status: DC
  Administered 2018-07-28 – 2018-07-29 (×3): 600 mg via ORAL
  Filled 2018-07-28 (×3): qty 1

## 2018-07-28 MED ORDER — SODIUM CHLORIDE 0.9 % IV SOLN
1.0000 g | INTRAVENOUS | Status: DC
  Filled 2018-07-28: qty 1

## 2018-07-28 MED ORDER — IPRATROPIUM-ALBUTEROL 0.5-2.5 (3) MG/3ML IN SOLN: 3.0000 mL | RESPIRATORY_TRACT | Status: DC | PRN

## 2018-07-28 NOTE — Progress Notes (Signed)
[facility] [place] NAME: Latoya Freeman    MR#:  [id number]  DATE OF BIRTH:  1941-03-19  SUBJECTIVE:  CHIEF COMPLAINT:   Chief Complaint  Patient presents with  . Fever  . Cough  Patient feeling better, no complaints  REVIEW OF SYSTEMS:  CONSTITUTIONAL: No fever, fatigue or weakness.  EYES: No blurred or double vision.  EARS, NOSE, AND THROAT: No tinnitus or ear pain.  RESPIRATORY: No cough, shortness of breath, wheezing or hemoptysis.  CARDIOVASCULAR: No chest pain, orthopnea, edema.  GASTROINTESTINAL: No nausea, vomiting, diarrhea or abdominal pain.  GENITOURINARY: No dysuria, hematuria.  ENDOCRINE: No polyuria, nocturia,  HEMATOLOGY: No anemia, easy bruising or bleeding SKIN: No rash or lesion. MUSCULOSKELETAL: No joint pain or arthritis.   NEUROLOGIC: No tingling, numbness, weakness.  PSYCHIATRY: No anxiety or depression.   ROS  DRUG ALLERGIES:   Allergies  Allergen Reactions  . Bee Venom Swelling  . Shellfish Allergy Swelling  . Tamoxifen Hives  . Penicillins Hives, Rash and Other (See Comments)    Has patient had a PCN reaction causing immediate rash, facial/tongue/throat swelling, SOB or lightheadedness with hypotension: Unknown Has patient had a PCN reaction causing severe rash involving mucus membranes or skin necrosis: Unknown Has patient had a PCN reaction that required hospitalization: Unknown Has patient had a PCN reaction occurring within the last 10 years: No If all of the above answers are "NO", then may proceed with Cephalosporin use.     VITALS:  Blood pressure (!) 113/52, pulse 78, temperature 99.4 F (37.4 C), temperature source Oral, resp. rate 18, height 5\' 1"  (1.549 m), weight 96.8 kg, SpO2 92 %.  PHYSICAL EXAMINATION:  GENERAL:  77 y.o.-year-old patient lying in the bed with no acute distress.  EYES: Pupils equal, round, reactive to light and accommodation. No scleral icterus. Extraocular muscles  intact.  HEENT: Head atraumatic, normocephalic. Oropharynx and nasopharynx clear.  NECK:  Supple, no jugular venous distention. No thyroid enlargement, no tenderness.  LUNGS: Normal breath sounds bilaterally, no wheezing, rales,rhonchi or crepitation. No use of accessory muscles of respiration.  CARDIOVASCULAR: S1, S2 normal. No murmurs, rubs, or gallops.  ABDOMEN: Soft, nontender, nondistended. Bowel sounds present. No organomegaly or mass.  EXTREMITIES: No pedal edema, cyanosis, or clubbing.  NEUROLOGIC: Cranial nerves II through XII are intact. Muscle strength 5/5 in all extremities. Sensation intact. Gait not checked.  PSYCHIATRIC: The patient is alert and oriented x 3.  SKIN: No obvious rash, lesion, or ulcer.   Physical Exam LABORATORY PANEL:   CBC Recent Labs  Lab 07/27/18 2018  WBC 12.3*  HGB 12.5  HCT 35.0  PLT 296   ------------------------------------------------------------------------------------------------------------------  Chemistries  Recent Labs  Lab 07/27/18 2018  NA 136  K 3.7  CL 96*  CO2 26  GLUCOSE 103*  BUN 17  CREATININE 1.81*  CALCIUM 9.5  AST 91*  ALT 75*  ALKPHOS 97  BILITOT 0.8   ------------------------------------------------------------------------------------------------------------------  Cardiac Enzymes No results for input(s): TROPONINI in the last 168 hours. ------------------------------------------------------------------------------------------------------------------  RADIOLOGY:  Dg Chest Port 1 View  Result Date: 07/27/2018 CLINICAL DATA:  Acute shortness of breath, hypoxia and cough for 2 days. EXAM: PORTABLE CHEST 1 VIEW COMPARISON:  None. FINDINGS: This is a low volume study. Cardiomegaly noted. Mild bibasilar opacities are present, LEFT-greater-than-RIGHT, and may represent airspace disease and/or atelectasis. Mild pulmonary vascular congestion is present. No pneumothorax or definite pleural effusion. No acute bony  abnormalities are noted. IMPRESSION: Mild bibasilar  opacities, LEFT-greater-than-RIGHT. Question airspace disease and/or atelectasis. Cardiomegaly and mild pulmonary vascular congestion. Electronically Signed   By: Margarette Canada M.D.   On: 07/27/2018 20:44    ASSESSMENT AND PLAN:  This is a 77 year old female admitted for pneumonia  *Acute HCAP Resolving  Continue empiric vancomycin/cefepime, follow-up on cultures, wean O2 off as tolerated   *Chronic hypertension  Stable on amlodipine, losartan and hydrochlorothiazide  * Hypothyroidism Continue Synthroid  *Recent left shoulder surgery Continue sling and ice pack  All the records are reviewed and case discussed with Care Management/Social Workerr. Management plans discussed with the patient, family and they are in agreement.  CODE STATUS: full  TOTAL TIME TAKING CARE OF THIS PATIENT: 40 minutes.     POSSIBLE D/C IN 1-2 DAYS, DEPENDING ON CLINICAL CONDITION.   Avel Peace Salary M.D on 07/28/2018   Between 7am to 6pm - Pager - [phone]  After 6pm go to [email or web address] - password [email or web address] [facility]  Office  [phone]  CC: Primary care physician; [name], MD  Note: This dictation was prepared with Dragon dictation along with smaller phrase technology. Any transcriptional errors that result from this process are unintentional.

## 2018-07-28 NOTE — Progress Notes (Signed)
Pharmacist - Prescriber Communication  Cefepime dose modified from 2 gm IV Q24H to 1 gm IV Q24H due to CrCl 25 to 30 ml/min.  Keyle Doby A. [place], [place].D., BCPS Clinical Pharmacist 07/28/18 13:07

## 2018-07-28 NOTE — Plan of Care (Signed)
  Problem: Health Behavior/Discharge Planning: Goal: Ability to manage health-related needs will improve Outcome: Progressing   Problem: Clinical Measurements: Goal: Ability to maintain clinical measurements within normal limits will improve Outcome: Progressing Goal: Respiratory complications will improve Outcome: Progressing Goal: Cardiovascular complication will be avoided Outcome: Progressing   Problem: Activity: Goal: Risk for activity intolerance will decrease Outcome: Progressing   Problem: Elimination: Goal: Will not experience complications related to bowel motility Outcome: Progressing   Problem: Pain Managment: Goal: General experience of comfort will improve Outcome: Progressing   Problem: Safety: Goal: Ability to remain free from injury will improve Outcome: Progressing   Problem: Skin Integrity: Goal: Risk for impaired skin integrity will decrease Outcome: Progressing   Problem: Activity: Goal: Ability to tolerate increased activity will improve Outcome: Progressing   Problem: Respiratory: Goal: Ability to maintain adequate ventilation will improve Outcome: Progressing

## 2018-07-28 NOTE — Progress Notes (Addendum)
Patient declined walking at this time. She states she is "a little dizzy" right now. NT did get patient sitting up at side of bed for a few minutes.  Patient received instruction on incentive spirometer.

## 2018-07-28 NOTE — H&P (Signed)
Latoya Freeman is an 77 y.o. female.   Chief Complaint: Fever HPI: The patient with past medical of hypothyroidism, breast cancer, and hypertension presents to the emergency department with cough and subjective fever. The patient underwent rotator cuff repair 2 days ago. Oxygen saturations were low at 84% on room air upon arrival. CXR showed bilateral lower lobe opacities concerning for pneumonia as well. Vancomycin and Cefepime were started and the emergency department staff called the hospitalist service for admission.   Past Medical History:  Diagnosis Date  . Aneurysm ([place]) 1980  . Breast cancer ([place]) 2013  . Cancer ([place])   . GERD (gastroesophageal reflux disease)   . Hypertension 1980  . Hypothyroidism 1999  . Personal history of malignant neoplasm of breast    LF Breast   . Shingles   . Wears dentures    full upper and lower    Past Surgical History:  Procedure Laterality Date  . BRAIN SURGERY    . BREAST CYST ASPIRATION Left 2005  . BREAST EXCISIONAL BIOPSY Left 2013   atypical ductal hyperplasia  . BREAST SURGERY  2013   LF Breast Wide EXC   . CHOLECYSTECTOMY  2004  . COLONOSCOPY    . COLONOSCOPY WITH PROPOFOL N/A 01/31/2018   Procedure: COLONOSCOPY WITH PROPOFOL;  Surgeon: Lollie Sails, MD;  Location: [facility] ENDOSCOPY;  Service: Endoscopy;  Laterality: N/A;  . ESOPHAGOGASTRODUODENOSCOPY N/A 01/31/2018   Procedure: ESOPHAGOGASTRODUODENOSCOPY (EGD);  Surgeon: Lollie Sails, MD;  Location: [facility] ENDOSCOPY;  Service: Endoscopy;  Laterality: N/A;  . SHOULDER ARTHROSCOPY Left 07/25/2018   Procedure: SHOULDER MINI OPEN ROTATOR CUFF REPAIR  BICEPS TENDOSIS ARTHROSCOPIC DISTAL CLAVICLE EXCISION  SUBACROMIAL DECOMP;  Surgeon: Leim Fabry, MD;  Location: [facility];  Service: Orthopedics;  Laterality: Left;  BEACH CHAIR WITH SPYDER SMITH & NEPHEW HEAD COIL ANCHOR FOOTPRINT ANCHOR QFIX ANCHOR    Family History  Problem Relation Age of Onset  . Breast cancer Neg  Hx    Social History:  reports that she quit smoking about 29 years ago. She has a 30.00 pack-year smoking history. She has never used smokeless tobacco. She reports that she does not drink alcohol or use drugs.  Allergies:  Allergies  Allergen Reactions  . Bee Venom Swelling  . Shellfish Allergy Swelling  . Tamoxifen Hives  . Penicillins Hives, Rash and Other (See Comments)    Has patient had a PCN reaction causing immediate rash, facial/tongue/throat swelling, SOB or lightheadedness with hypotension: Unknown Has patient had a PCN reaction causing severe rash involving mucus membranes or skin necrosis: Unknown Has patient had a PCN reaction that required hospitalization: Unknown Has patient had a PCN reaction occurring within the last 10 years: No If all of the above answers are "NO", then may proceed with Cephalosporin use.     Medications Prior to Admission  Medication Sig Dispense Refill  . acetaminophen (TYLENOL) 500 MG tablet Take 2 tablets (1,000 mg total) by mouth every 8 (eight) hours. (Patient taking differently: Take 1,000 mg by mouth every 4 (four) hours. ) 90 tablet 2  . amLODipine (NORVASC) 5 MG tablet Take 5 mg by mouth daily.     . citalopram (CELEXA) 10 MG tablet Take 10 mg by mouth at bedtime.     . Cyanocobalamin (VITAMIN B-12 PO) Take by mouth daily.    . hydrochlorothiazide (HYDRODIURIL) 25 MG tablet Take 25 mg by mouth daily.     Marland Kitchen levothyroxine (SYNTHROID, LEVOTHROID) 75 MCG tablet Take 75 mcg  by mouth daily.    Marland Kitchen losartan (COZAAR) 50 MG tablet Take 50 mg by mouth daily.     Marland Kitchen omeprazole (PRILOSEC) 20 MG capsule Take 20 mg by mouth daily.    . ondansetron (ZOFRAN ODT) 4 MG disintegrating tablet Take 1 tablet (4 mg total) by mouth every 8 (eight) hours as needed for nausea or vomiting. 20 tablet 0  . oxyCODONE (ROXICODONE) 5 MG immediate release tablet Take 1-2 tablets (5-10 mg total) by mouth every 4 (four) hours as needed (pain). 45 tablet 0  . potassium  chloride (K-DUR,KLOR-CON) 10 MEQ tablet Take 20 mEq by mouth daily.  3  . aspirin EC 325 MG tablet Take 1 tablet (325 mg total) by mouth daily for 14 days. (Patient not taking: Reported on 07/27/2018) 14 tablet 0  . diphenhydrAMINE (BENADRYL) 25 mg capsule Take 1 capsule (25 mg total) by mouth every 6 (six) hours as needed (allergy symptoms). 15 capsule 0    Results for orders placed or performed during the hospital encounter of 07/27/18 (from the past 48 hour(s))  Lactic acid, plasma     Status: Abnormal   Collection Time: 07/27/18  8:17 PM  Result Value Ref Range   Lactic Acid, Venous 3.7 (HH) 0.5 - 1.9 mmol/L    Comment: CRITICAL RESULT CALLED TO, READ BACK BY AND VERIFIED WITH SHERRIE ALLSION AT 2125 07/27/2018.  TFK Performed at [facility], [address] [place], [place] [address]   Urinalysis, Complete w Microscopic     Status: Abnormal   Collection Time: 07/27/18  8:17 PM  Result Value Ref Range   Color, Urine YELLOW (A) YELLOW   APPearance HAZY (A) CLEAR   Specific Gravity, Urine 1.017 1.005 - 1.030   pH 5.0 5.0 - 8.0   Glucose, UA NEGATIVE NEGATIVE mg/dL   Hgb urine dipstick NEGATIVE NEGATIVE   Bilirubin Urine NEGATIVE NEGATIVE   Ketones, ur NEGATIVE NEGATIVE mg/dL   Protein, ur NEGATIVE NEGATIVE mg/dL   Nitrite NEGATIVE NEGATIVE   Leukocytes, UA NEGATIVE NEGATIVE   RBC / HPF 0-5 0 - 5 RBC/hpf   WBC, UA 11-20 0 - 5 WBC/hpf   Bacteria, UA NONE SEEN NONE SEEN   Squamous Epithelial / LPF 0-5 0 - 5   Mucus PRESENT    Hyaline Casts, UA PRESENT     Comment: Performed at [facility], [address] [place], [place] [address]  Comprehensive metabolic panel     Status: Abnormal   Collection Time: 07/27/18  8:18 PM  Result Value Ref Range   Sodium 136 135 - 145 mmol/L   Potassium 3.7 3.5 - 5.1 mmol/L    Comment: HEMOLYSIS AT THIS LEVEL MAY AFFECT RESULT   Chloride 96 (L) 98 - 111 mmol/L   CO2 26 22 - 32 mmol/L   Glucose, Bld 103 (H) 70 - 99 mg/dL    BUN 17 8 - 23 mg/dL   Creatinine, Ser 1.81 (H) 0.44 - 1.00 mg/dL   Calcium 9.5 8.9 - 10.3 mg/dL   Total Protein 7.5 6.5 - 8.1 g/dL   Albumin 3.8 3.5 - 5.0 g/dL   AST 91 (H) 15 - 41 U/L   ALT 75 (H) 0 - 44 U/L   Alkaline Phosphatase 97 38 - 126 U/L   Total Bilirubin 0.8 0.3 - 1.2 mg/dL   GFR calc non Af Amer 26 (L) >60 mL/min   GFR calc Af Amer 30 (L) >60 mL/min    Comment: (NOTE) The eGFR  has been calculated using the CKD EPI equation. This calculation has not been validated in all clinical situations. eGFR's persistently <60 mL/min signify possible Chronic Kidney Disease.    Anion gap 14 5 - 15    Comment: Performed at [facility], [address] [place], [place] [address]  CBC with Differential     Status: Abnormal   Collection Time: 07/27/18  8:18 PM  Result Value Ref Range   WBC 12.3 (H) 3.6 - 11.0 K/uL   RBC 4.07 3.80 - 5.20 MIL/uL   Hemoglobin 12.5 12.0 - 16.0 g/dL   HCT 35.0 35.0 - 47.0 %   MCV 86.1 80.0 - 100.0 fL   MCH 30.8 26.0 - 34.0 pg   MCHC 35.8 32.0 - 36.0 g/dL   RDW 13.3 11.5 - 14.5 %   Platelets 296 150 - 440 K/uL   Neutrophils Relative % 70 %   Neutro Abs 8.7 (H) 1.4 - 6.5 K/uL   Lymphocytes Relative 21 %   Lymphs Abs 2.6 1.0 - 3.6 K/uL   Monocytes Relative 8 %   Monocytes Absolute 0.9 0.2 - 0.9 K/uL   Eosinophils Relative 0 %   Eosinophils Absolute 0.0 0 - 0.7 K/uL   Basophils Relative 1 %   Basophils Absolute 0.1 0 - 0.1 K/uL    Comment: Performed at [facility], [address] [place], [place] [address]  TSH     Status: None   Collection Time: 07/27/18  8:18 PM  Result Value Ref Range   TSH 0.618 0.350 - 4.500 uIU/mL    Comment: Performed by a 3rd Generation assay with a functional sensitivity of <=0.01 uIU/mL. Performed at [facility], [address] [place], [place] [address]   Protime-INR     Status: None   Collection Time: 07/27/18  9:10 PM  Result Value Ref Range   Prothrombin Time 13.3 11.4 - 15.2  seconds   INR 1.02     Comment: Performed at [facility], [address] [place], [place] [address]   Dg Chest Port 1 View  Result Date: 07/27/2018 CLINICAL DATA:  Acute shortness of breath, hypoxia and cough for 2 days. EXAM: PORTABLE CHEST 1 VIEW COMPARISON:  None. FINDINGS: This is a low volume study. Cardiomegaly noted. Mild bibasilar opacities are present, LEFT-greater-than-RIGHT, and may represent airspace disease and/or atelectasis. Mild pulmonary vascular congestion is present. No pneumothorax or definite pleural effusion. No acute bony abnormalities are noted. IMPRESSION: Mild bibasilar opacities, LEFT-greater-than-RIGHT. Question airspace disease and/or atelectasis. Cardiomegaly and mild pulmonary vascular congestion. Electronically Signed   By: Margarette Canada M.D.   On: 07/27/2018 20:44    Review of Systems  Constitutional: Positive for fever. Negative for chills.  HENT: Negative for sore throat and tinnitus.   Eyes: Negative for blurred vision and redness.  Respiratory: Positive for cough. Negative for shortness of breath.   Cardiovascular: Negative for chest pain, palpitations, orthopnea and PND.  Gastrointestinal: Negative for abdominal pain, diarrhea, nausea and vomiting.  Genitourinary: Negative for dysuria, frequency and urgency.  Musculoskeletal: Negative for joint pain and myalgias.  Skin: Negative for rash.       No lesions  Neurological: Negative for speech change, focal weakness and weakness.  Endo/Heme/Allergies: Does not bruise/bleed easily.       No temperature intolerance  Psychiatric/Behavioral: Negative for depression and suicidal ideas.    Blood pressure (!) 130/54, pulse 71, temperature 98.8 F (37.1 C), temperature source Oral, resp. rate 17, height 5'  1" (1.549 m), weight 89.4 kg, SpO2 90 %. Physical Exam  Vitals reviewed. Constitutional: She is oriented to person, place, and time. She appears well-developed and well-nourished. No distress.   HENT:  Head: Normocephalic and atraumatic.  Mouth/Throat: Oropharynx is clear and moist.  Eyes: Pupils are equal, round, and reactive to light. Conjunctivae and EOM are normal. No scleral icterus.  Neck: Normal range of motion. Neck supple. No JVD present. No tracheal deviation present. No thyromegaly present.  Cardiovascular: Normal rate, regular rhythm and normal heart sounds. Exam reveals no gallop and no friction rub.  No murmur heard. Respiratory: Effort normal and breath sounds normal.  GI: Soft. Bowel sounds are normal. She exhibits no distension. There is no tenderness.  Genitourinary:  Genitourinary Comments: Deferred  Musculoskeletal: Normal range of motion. She exhibits deformity (Surgical incision clean and dressed.  Left upper extremity immobilized in sling). She exhibits no edema.  Lymphadenopathy:    She has no cervical adenopathy.  Neurological: She is alert and oriented to person, place, and time. No cranial nerve deficit. She exhibits normal muscle tone.  Skin: Skin is warm and dry. No rash noted. No erythema.  Psychiatric: She has a normal mood and affect. Her behavior is normal. Judgment and thought content normal.     Assessment/Plan This is a 77 year old female admitted for pneumonia. 1.  Pneumonia: Healthcare associated.  Continue cefepime and vancomycin.  The patient does not require submental oxygen at this time.  Monitor for signs and symptoms of sepsis. 2.  Hypertension: Controlled; continue amlodipine, losartan and hydrochlorothiazide 3.  Hypothyroidism: Check TSH; continue Synthroid 4.  DVT prophylaxis: Heparin 5.  GI prophylaxis: Pantoprazole per home regimen The patient is a full code.  Time spent on admission orders and patient care approximately 45 minutes  Harrie Foreman, MD 07/28/2018, 1:55 AM

## 2018-07-29 MED ORDER — GUAIFENESIN 100 MG/5ML PO SOLN
5.0000 mL | ORAL | Status: DC | PRN
  Filled 2018-07-29: qty 5

## 2018-07-29 NOTE — Plan of Care (Signed)
  Problem: Health Behavior/Discharge Planning: Goal: Ability to manage health-related needs will improve Outcome: Progressing   Problem: Clinical Measurements: Goal: Ability to maintain clinical measurements within normal limits will improve Outcome: Progressing Goal: Respiratory complications will improve Outcome: Progressing Goal: Cardiovascular complication will be avoided Outcome: Progressing   Problem: Activity: Goal: Risk for activity intolerance will decrease Outcome: Progressing   Problem: Elimination: Goal: Will not experience complications related to bowel motility Outcome: Progressing   Problem: Pain Managment: Goal: General experience of comfort will improve Outcome: Progressing   Problem: Safety: Goal: Ability to remain free from injury will improve Outcome: Progressing   Problem: Skin Integrity: Goal: Risk for impaired skin integrity will decrease Outcome: Progressing   Problem: Activity: Goal: Ability to tolerate increased activity will improve Outcome: Progressing   Problem: Clinical Measurements: Goal: Ability to maintain a body temperature in the normal range will improve Outcome: Progressing   Problem: Respiratory: Goal: Ability to maintain adequate ventilation will improve Outcome: Progressing Goal: Ability to maintain a clear airway will improve Outcome: Progressing

## 2018-07-29 NOTE — Progress Notes (Signed)
[facility] [place] NAME: Latoya Freeman    MR#:  [id number]  DATE OF BIRTH:  11/17/1941  SUBJECTIVE:  CHIEF COMPLAINT:   Chief Complaint  Patient presents with  . Fever  . Cough   Patient feels better but has some cough. REVIEW OF SYSTEMS:  CONSTITUTIONAL: No fever, fatigue or weakness.  EYES: No blurred or double vision.  EARS, NOSE, AND THROAT: No tinnitus or ear pain.  RESPIRATORY: has cough, no shortness of breath, wheezing or hemoptysis.  CARDIOVASCULAR: No chest pain, orthopnea, edema.  GASTROINTESTINAL: No nausea, vomiting, diarrhea or abdominal pain.  GENITOURINARY: No dysuria, hematuria.  ENDOCRINE: No polyuria, nocturia,  HEMATOLOGY: No anemia, easy bruising or bleeding SKIN: No rash or lesion. MUSCULOSKELETAL: No joint pain or arthritis.   NEUROLOGIC: No tingling, numbness, weakness.  PSYCHIATRY: No anxiety or depression.   ROS  DRUG ALLERGIES:   Allergies  Allergen Reactions  . Bee Venom Swelling  . Shellfish Allergy Swelling  . Tamoxifen Hives  . Penicillins Hives, Rash and Other (See Comments)    Has patient had a PCN reaction causing immediate rash, facial/tongue/throat swelling, SOB or lightheadedness with hypotension: Unknown Has patient had a PCN reaction causing severe rash involving mucus membranes or skin necrosis: Unknown Has patient had a PCN reaction that required hospitalization: Unknown Has patient had a PCN reaction occurring within the last 10 years: No If all of the above answers are "NO", then may proceed with Cephalosporin use.     VITALS:  Blood pressure 122/61, pulse 71, temperature (!) 97.5 F (36.4 C), temperature source Oral, resp. rate 18, height 5\' 1"  (1.549 m), weight 96.8 kg, SpO2 97 %.  PHYSICAL EXAMINATION:  GENERAL:  77 y.o.-year-old patient lying in the bed with no acute distress.  EYES: Pupils equal, round, reactive to light and accommodation. No scleral icterus. Extraocular  muscles intact.  HEENT: Head atraumatic, normocephalic. Oropharynx and nasopharynx clear.  NECK:  Supple, no jugular venous distention. No thyroid enlargement, no tenderness.  LUNGS: Normal breath sounds bilaterally, no wheezing, rales,rhonchi or crepitation. No use of accessory muscles of respiration.  CARDIOVASCULAR: S1, S2 normal. No murmurs, rubs, or gallops.  ABDOMEN: Soft, nontender, nondistended. Bowel sounds present. No organomegaly or mass.  EXTREMITIES: No pedal edema, cyanosis, or clubbing.  NEUROLOGIC: Cranial nerves II through XII are intact. Muscle strength 5/5 in all extremities. Sensation intact. Gait not checked.  PSYCHIATRIC: The patient is alert and oriented x 3.  SKIN: No obvious rash, lesion, or ulcer.   Physical Exam LABORATORY PANEL:   CBC Recent Labs  Lab 07/27/18 2018  WBC 12.3*  HGB 12.5  HCT 35.0  PLT 296   ------------------------------------------------------------------------------------------------------------------  Chemistries  Recent Labs  Lab 07/27/18 2018  NA 136  K 3.7  CL 96*  CO2 26  GLUCOSE 103*  BUN 17  CREATININE 1.81*  CALCIUM 9.5  AST 91*  ALT 75*  ALKPHOS 97  BILITOT 0.8   ------------------------------------------------------------------------------------------------------------------  Cardiac Enzymes No results for input(s): TROPONINI in the last 168 hours. ------------------------------------------------------------------------------------------------------------------  RADIOLOGY:  Dg Chest Port 1 View  Result Date: 07/27/2018 CLINICAL DATA:  Acute shortness of breath, hypoxia and cough for 2 days. EXAM: PORTABLE CHEST 1 VIEW COMPARISON:  None. FINDINGS: This is a low volume study. Cardiomegaly noted. Mild bibasilar opacities are present, LEFT-greater-than-RIGHT, and may represent airspace disease and/or atelectasis. Mild pulmonary vascular congestion is present. No pneumothorax or definite pleural effusion. No acute  bony abnormalities are noted.  IMPRESSION: Mild bibasilar opacities, LEFT-greater-than-RIGHT. Question airspace disease and/or atelectasis. Cardiomegaly and mild pulmonary vascular congestion. Electronically Signed   By: Margarette Canada M.D.   On: 07/27/2018 20:44    ASSESSMENT AND PLAN:  This is a 77 year old female admitted for pneumonia  *Acute respiratory failure with hypoxia due to HCAP  Continue empiric cefepime, discontinue vancomycin since MRSA screen is negative, follow-up on cultures, wean O2 off as tolerated   *Essential hypertension  Stable on amlodipine, losartan and hydrochlorothiazide  * Hypothyroidism Continue Synthroid  *Recent left shoulder surgery Continue sling and ice pack, PT, OT evaluation.  All the records are reviewed and case discussed with Care Management/Social Workerr. Management plans discussed with the patient, her daughter and they are in agreement.  CODE STATUS: full  TOTAL TIME TAKING CARE OF THIS PATIENT: 33 minutes.  POSSIBLE D/C IN 1-2 DAYS, DEPENDING ON CLINICAL CONDITION.   Demetrios Loll M.D on 07/29/2018   Between 7am to 6pm - Pager - [phone]  After 6pm go to [email or web address] - password [email or web address] [facility]  Office  [phone]  CC: Primary care physician; [name], MD  Note: This dictation was prepared with Dragon dictation along with smaller phrase technology. Any transcriptional errors that result from this process are unintentional.

## 2018-07-29 NOTE — Evaluation (Signed)
Physical Therapy Evaluation Patient Details Name: Latoya Freeman MRN: [id number] DOB: Apr 07, 1941 Today's Date: 07/29/2018   History of Present Illness  77 yo female was admitted after having a rotator cuff repair on L shoulder on 9/3, now admitted with PNA.  Pt was home with SOB and hypoxic on arrival to hosp.  PMHx:  HTN, aneurysm, breast CA, shingles  Clinical Impression  Pt was seen for evaluation of mobility with L UE hindered by sling support from rotator cuff repair on 07/25/18, and returning to hosp for PNA.  Pt is demonstrating loss of O2 sat with mobility to bedside to 88% and then to 85% very briefly to 90% at last to walk.  Her plan is to follow up with HHPT and 24 hour home assistance with roommate, then hopefully will recover her control of O2 sat values with mobility on room air.  For now requires the 2L and [facility] for gait to corner and change directions in the room, and will follow for endurance and balance acutely toward these needs.    Follow Up Recommendations Home health PT;Supervision for mobility/OOB    Equipment Recommendations  None recommended by PT    Recommendations for Other Services       Precautions / Restrictions Precautions Precautions: Fall Required Braces or Orthoses: Sling(L shoulder at all times) Restrictions Weight Bearing Restrictions: Yes Other Position/Activity Restrictions: no WB through L elbow      Mobility  Bed Mobility Overal bed mobility: Needs Assistance Bed Mobility: Supine to Sit     Supine to sit: Min assist     General bed mobility comments: assisted her trunk and pt used bedrail with HOB elevated  Transfers Overall transfer level: Needs assistance Equipment used: Straight cane;1 person hand held assist Transfers: Sit to/from Stand Sit to Stand: Min guard         General transfer comment: pt is able to steady with min guard but is also benefiting from cane to widen base of support  Ambulation/Gait Ambulation/Gait  assistance: Min guard Gait Distance (Feet): 45 Feet(15' x 3) Assistive device: 1 person hand held assist;Straight cane Gait Pattern/deviations: Step-through pattern;Decreased stride length;Trunk flexed Gait velocity: reduced   General Gait Details: slight LOB on R side with turn to R around bed but caought with SPC(O2 sats are 87% briefly then 90-91% with effort on 2L O2)  Stairs Stairs: (not yet attempted)          Wheelchair Mobility    Modified Rankin (Stroke Patients Only)       Balance Overall balance assessment: Needs assistance   Sitting balance-Leahy Scale: Good     Standing balance support: Single extremity supported Standing balance-Leahy Scale: Fair                               Pertinent Vitals/Pain Pain Assessment: Faces Faces Pain Scale: Hurts even more Pain Location: L shoulder Pain Descriptors / Indicators: Operative site guarding Pain Intervention(s): Limited activity within patient's tolerance;Monitored during session;Premedicated before session;Repositioned    Home Living Family/patient expects to be discharged to:: Private residence Living Arrangements: Non-relatives/Friends Available Help at Discharge: Friend(s);Available 24 hours/day Type of Home: House Home Access: Stairs to enter Entrance Stairs-Rails: Chemical engineer of Steps: 2 Home Layout: One level Home Equipment: None Additional Comments: pt may need to use O2 at home and will need assistance with mobility potentially    Prior Function Level of Independence: Independent  Comments: had just gone home with her roommate and his daughter to assist at times     Hand Dominance   Dominant Hand: Right    Extremity/Trunk Assessment   Upper Extremity Assessment Upper Extremity Assessment: Defer to OT evaluation    Lower Extremity Assessment Lower Extremity Assessment: Overall WFL for tasks assessed    Cervical / Trunk  Assessment Cervical / Trunk Assessment: Other exceptions(has sling on L shoulder so may be more limited for trunk mob)  Communication   Communication: No difficulties  Cognition Arousal/Alertness: Awake/alert Behavior During Therapy: WFL for tasks assessed/performed Overall Cognitive Status: Within Functional Limits for tasks assessed                                 General Comments: highly motivated for walking      General Comments General comments (skin integrity, edema, etc.): pt had decreased control of balance and bed mob with L shoulder sling, agreed to be seen with SPC to assist control of standing and gait    Exercises     Assessment/Plan    PT Assessment Patient needs continued PT services  PT Problem List Decreased strength;Decreased range of motion;Decreased activity tolerance;Decreased mobility;Decreased balance;Decreased coordination;Decreased knowledge of use of DME;Decreased safety awareness;Cardiopulmonary status limiting activity;Obesity;Decreased skin integrity;Pain       PT Treatment Interventions DME instruction;Gait training;Stair training;Functional mobility training;Therapeutic activities;Therapeutic exercise;Balance training;Neuromuscular re-education;Patient/family education    PT Goals (Current goals can be found in the Care Plan section)  Acute Rehab PT Goals Patient Stated Goal: to walk and feel better PT Goal Formulation: With patient/family Time For Goal Achievement: 08/12/18 Potential to Achieve Goals: Good    Frequency Min 2X/week   Barriers to discharge Inaccessible home environment;Decreased caregiver support home with friend to assist her care, is able to assist her bathing    Co-evaluation               AM-PAC PT "6 Clicks" Daily Activity  Outcome Measure Difficulty turning over in bed (including adjusting bedclothes, sheets and blankets)?: A Little Difficulty moving from lying on back to sitting on the side of the  bed? : Unable Difficulty sitting down on and standing up from a chair with arms (e.g., wheelchair, bedside commode, etc,.)?: Unable Help needed moving to and from a bed to chair (including a wheelchair)?: A Little Help needed walking in hospital room?: A Little Help needed climbing 3-5 steps with a railing? : A Little 6 Click Score: 14    End of Session Equipment Utilized During Treatment: Oxygen;Gait belt Activity Tolerance: Patient limited by fatigue;Treatment limited secondary to medical complications (Comment) Patient left: in chair;with call bell/phone within reach;with family/visitor present Nurse Communication: Mobility status PT Visit Diagnosis: Unsteadiness on feet (R26.81);Pain;Other (comment)(hypoxia with mobility) Pain - Right/Left: Left Pain - part of body: Shoulder    Time: 1129-1202 PT Time Calculation (min) (ACUTE ONLY): 33 min   Charges:   PT Evaluation $PT Eval Moderate Complexity: 1 Mod PT Treatments $Gait Training: 8-22 mins       Ramond Dial 07/29/2018, 2:56 PM   Mee Hives, PT MS Acute Rehab Dept. Number: [place] and [place]

## 2018-07-29 NOTE — Evaluation (Signed)
Occupational Therapy Evaluation Patient Details Name: Latoya Freeman MRN: [id number] DOB: [birth year] Today's Date: 07/29/2018    History of Present Illness 77 yo female was admitted after having a rotator cuff repair on L shoulder on 9/3, now admitted with PNA.  Pt was home with SOB and hypoxic on arrival to hosp.  PMHx:  HTN, aneurysm, breast CA, shingles   Clinical Impression   Patient was seen for an OT evaluation this date. 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 Prior to surgery on 9/3, pt was active and independent. Pt has orders for dominant LUE to be immobilized and will be NWBing per MD. Patient presents with impaired strength/ROM, pain, and impaired UE functional use, requiring assist for dressing, bathing, and sling mgt. Pt notes that her friend that she lives with is able to and has been providing sufficient assist for her needs at home since the surgery. Pt instructed in sling/immobilizer mgt, ROM exercises for LUE (with instructions for no elbow and shoulder AROM until therapist indicates otherwise), LUE precautions, adaptive strategies for bathing/dressing/toileting/grooming, positioning and considerations for sleep, and home/routines modifications to maximize falls prevention, safety, and independence. Pt verbalizes understanding to all education/training provided. Pt able to ambulate with supervision and SPC and perform grooming tasks at sink, ambulating/standing for approx 5 minutes with 2L O2 via [place] and O2 sats >91% with occasional verbal cues for pursed lip breathing after initial education in PLB and energy conservation strategies to minimize risk of SOB and over exertion. Pt will benefit from skilled OT services while in the hospital to address these limitations and improve independence in daily tasks. Recommend follow up therapy as arranged by the surgeon for rehab of shoulder.     Follow Up Recommendations  Follow surgeon's recommendation for DC plan and  follow-up therapies    Equipment Recommendations  None recommended by OT    Recommendations for Other Services       Precautions / Restrictions Precautions Precautions: Fall;Shoulder Type of Shoulder Precautions: no AROM shoulder, no AROM elbow; wrist/hand AROM okay Shoulder Interventions: Shoulder sling/immobilizer;At all times;Off for dressing/bathing/exercises Required Braces or Orthoses: Sling Restrictions Weight Bearing Restrictions: Yes LUE Weight Bearing: Non weight bearing Other Position/Activity Restrictions: no WB through L elbow      Mobility Bed Mobility Overal bed mobility: Needs Assistance Bed Mobility: Sit to Supine     Sit to supine: Supervision   General bed mobility comments: assisted her trunk and pt used bedrail with HOB elevated  Transfers Overall transfer level: Needs assistance Equipment used: Straight cane Transfers: Sit to/from Stand Sit to Stand: Supervision         General transfer comment: pt is able to steady with min guard but is also benefiting from cane to widen base of support    Balance Overall balance assessment: Needs assistance Sitting-balance support: Feet supported Sitting balance-Leahy Scale: Good     Standing balance support: Single extremity supported Standing balance-Leahy Scale: Fair                             ADL either performed or assessed with clinical judgement   ADL Overall ADL's : Needs assistance/impaired Eating/Feeding: Sitting;Modified independent Eating/Feeding Details (indicate cue type and reason): using non-dom R hand Grooming: Standing;Oral care;Supervision/safety Grooming Details (indicate cue type and reason): using non-dom R hand Upper Body Bathing: Sitting;Moderate assistance;With caregiver independent assisting   Lower Body Bathing: Sit to/from  stand;Minimal assistance;Moderate assistance;With caregiver independent assisting   Upper Body Dressing : Sitting;With caregiver  independent assisting;Maximal assistance;Moderate assistance   Lower Body Dressing: With caregiver independent assisting;Minimal assistance;Moderate assistance;Sit to/from stand   Toilet Transfer: Ambulation;Regular Chartered certified accountant Details (indicate cue type and reason): with SPC for improved stability         Functional mobility during ADLs: Supervision/safety;Cane       Vision Baseline Vision/History: Wears glasses Wears Glasses: Reading only Patient Visual Report: No change from baseline       Perception     Praxis      Pertinent Vitals/Pain Pain Assessment: 0-10 Pain Score: 5  Faces Pain Scale: Hurts even more Pain Location: L shoulder Pain Descriptors / Indicators: Operative site guarding Pain Intervention(s): Limited activity within patient's tolerance;Monitored during session;RN gave pain meds during session;Repositioned     Hand Dominance Left   Extremity/Trunk Assessment Upper Extremity Assessment Upper Extremity Assessment: LUE deficits/detail(RUE WFL despite some arthritic shoulder changes/pain) LUE Deficits / Details: wrist/hand ROM WFL, sensation intact LUE: Unable to fully assess due to pain;Unable to fully assess due to immobilization LUE Sensation: WNL LUE Coordination: decreased gross motor   Lower Extremity Assessment Lower Extremity Assessment: Overall WFL for tasks assessed;Defer to PT evaluation   Cervical / Trunk Assessment Cervical / Trunk Assessment: Other exceptions(has sling on L shoulder so may be more limited for trunk mob)   Communication Communication Communication: No difficulties   Cognition Arousal/Alertness: Awake/alert Behavior During Therapy: WFL for tasks assessed/performed Overall Cognitive Status: Within Functional Limits for tasks assessed                                 General Comments: highly motivated for walking   General Comments  pt had decreased control of balance and bed  mob with L shoulder sling, agreed to be seen with SPC to assist control of standing and gait    Exercises Exercises:  Other Exercises Other Exercises: pt educated in shoulder precautions to maximize recall and carryover  Other Exercises: pt educated in techniques to support improved independence and safety with UB ADL tasks Other Exercises: pt educated in energy conservation strategies including pursed lip breathing and activity pacing to minimize SOB/over exertion   Shoulder Instructions      Home Living Family/patient expects to be discharged to:: Private residence Living Arrangements: Non-relatives/Friends Available Help at Discharge: Friend(s);Available 24 hours/day Type of Home: House Home Access: Stairs to enter [employer] of Steps: 2 Entrance Stairs-Rails: Left;Right(rail is in the middle of the steps) Home Layout: One level     Bathroom Shower/Tub: Teacher, early years/pre: Standard     Home Equipment: Tub bench   Additional Comments: pt may need to use O2 at home and will need assistance with mobility potentially      Prior Functioning/Environment Level of Independence: Needs assistance        Comments: had just gone home with her roommate/friend and his daughter to assist at times. Pt reports friend was able to provide sufficient level of assist for ADL following her shoulder surgery. Prior to surgery, pt was independent        OT Problem List: Decreased strength;Decreased range of motion;Pain;Impaired UE functional use      OT Treatment/Interventions:      OT Goals(Current goals can be found in the care plan section) Acute Rehab OT Goals Patient Stated Goal: to walk and feel better  Time For Goal Achievement: 08/12/18 Potential to Achieve Goals: Good ADL Goals Additional ADL Goal #1: Pt will perform typical AM ADL routine with PRN assist from caregiver while implementing PLB to maintain O2 sats >90% on room air. Additional ADL Goal  #2: Pt will independently verbalize shoulder precautions and sling/immobilizer mgt including donning/doffing, wear schedule, and positioning.  OT Frequency:     Barriers to D/C:            Co-evaluation              AM-PAC PT "6 Clicks" Daily Activity     Outcome Measure Help from another person eating meals?: None Help from another person taking care of personal grooming?: None Help from another person toileting, which includes using toliet, bedpan, or urinal?: None Help from another person bathing (including washing, rinsing, drying)?: A Little Help from another person to put on and taking off regular upper body clothing?: A Lot Help from another person to put on and taking off regular lower body clothing?: A Little 6 Click Score: 20   End of Session    Activity Tolerance: Patient tolerated treatment well Patient left: in bed;with call bell/phone within reach;with bed alarm set;Other (comment);with SCD's reapplied(shoulder sling in place)  OT Visit Diagnosis: Other abnormalities of gait and mobility (R26.89);Pain Pain - Right/Left: Left Pain - part of body: Shoulder                Time: 5697-9480 OT Time Calculation (min): 27 min Charges:  OT General Charges $OT Visit: 1 Visit OT Evaluation $OT Eval Low Complexity: 1 Low OT Treatments $Self Care/Home Management : 8-22 mins  Jeni Salles, MPH, MS, OTR/L ascom [phone] 07/29/18, 3:27 PM

## 2018-07-30 MED ORDER — GUAIFENESIN 100 MG/5ML PO SOLN: 5.0000 mL | ORAL | 0 refills | Status: DC | PRN

## 2018-07-30 MED ORDER — CEFDINIR 300 MG PO CAPS
300.0000 mg | ORAL_CAPSULE | Freq: Every day | ORAL | Status: DC
  Administered 2018-07-30: 300 mg via ORAL
  Filled 2018-07-30 (×2): qty 1

## 2018-07-30 MED ORDER — CEFDINIR 300 MG PO CAPS: 300.0000 mg | ORAL_CAPSULE | Freq: Every day | ORAL | 0 refills | Status: DC

## 2018-07-30 NOTE — Discharge Instructions (Signed)
HHPT °

## 2018-07-30 NOTE — Care Management Note (Signed)
Case Management Note  Patient Details  Name: Latoya Freeman MRN: [id number] Date of Birth: [birth year]  Subjective/Objective:   Patient to be discharged per MD order. Orders in place for home health services. Patient agreeable and has no preference as long as "insurance covers it good". Referral placed with Malachy Mood from [facility] who agrees to accept the patient for PT and RN services.No DME needs. Family to provide transport.  [name] RN BSN RNCM [phone]                   Action/Plan:   Expected Discharge Date:  07/30/18               Expected Discharge Plan:  [place]  In-House Referral:     Discharge planning Services  CM Consult  Post Acute Care Choice:  Home Health Choice offered to:     DME Arranged:    DME Agency:     HH Arranged:  PT, RN HH Agency:  [place]  Status of Service:  Completed, signed off  If discussed at [place] of Stay Meetings, dates discussed:    Additional Comments:  [name], RN 07/30/2018, 11:23 AM

## 2018-07-30 NOTE — Progress Notes (Signed)
Patient d/c home. Instructions and prescriptions given to patient, verbalized understanding. Friend at bedside transporting patient home.

## 2018-07-30 NOTE — Progress Notes (Signed)
Ambulated patient around nurses station once, O2 sats maintained between 92-94% room air with exertion. Plan to d/c home today.

## 2018-07-30 NOTE — Discharge Summary (Signed)
Latoya [facility] at [place] NAME: Latoya Freeman    MR#:  [id number]  DATE OF BIRTH:  February 21, 1941  DATE OF ADMISSION:  07/27/2018   ADMITTING PHYSICIAN: Harrie Foreman, MD  DATE OF DISCHARGE: 07/30/2018 10:07 AM  PRIMARY CARE PHYSICIAN: [name], MD   ADMISSION DIAGNOSIS:  SOB (shortness of breath) [R06.02] Acute respiratory failure with hypoxia (HCC) [J96.01] Low O2 saturation [R79.81] Sepsis, due to unspecified organism ([place]) [A41.9] Pneumonia due to infectious organism, unspecified laterality, unspecified part of lung [J18.9] DISCHARGE DIAGNOSIS:  Active Problems:   HCAP (healthcare-associated pneumonia)  SECONDARY DIAGNOSIS:   Past Medical History:  Diagnosis Date  . Aneurysm ([place]) 1980  . Breast cancer ([place]) 2013  . Cancer ([place])   . GERD (gastroesophageal reflux disease)   . Hypertension 1980  . Hypothyroidism 1999  . Personal history of malignant neoplasm of breast    LF Breast   . Shingles   . Wears dentures    full upper and lower   HOSPITAL COURSE:  This is a 77 year old female admitted for pneumonia  *Acute respiratory failure with hypoxia due to HCAP  She was treated with IV cefepime, discontinued vancomycin since MRSA screen is negative, weaned O2 off. Change to p.o. cefdinir for 5 more days.  *Essential hypertension  Stable on amlodipine, losartan and hydrochlorothiazide  *Hypothyroidism Continue Synthroid  *Recent left shoulder surgery Continue sling  PT, OT evaluation suggested home health and PT. DISCHARGE CONDITIONS:  Stable, discharged to home with home health and PT today. CONSULTS OBTAINED:   DRUG ALLERGIES:   Allergies  Allergen Reactions  . Bee Venom Swelling  . Shellfish Allergy Swelling  . Tamoxifen Hives  . Penicillins Hives, Rash and Other (See Comments)    Has patient had a PCN reaction causing immediate rash, facial/tongue/throat swelling, SOB or lightheadedness with  hypotension: Unknown Has patient had a PCN reaction causing severe rash involving mucus membranes or skin necrosis: Unknown Has patient had a PCN reaction that required hospitalization: Unknown Has patient had a PCN reaction occurring within the last 10 years: No If all of the above answers are "NO", then may proceed with Cephalosporin use.    DISCHARGE MEDICATIONS:   Allergies as of 07/30/2018      Reactions   Bee Venom Swelling   Shellfish Allergy Swelling   Tamoxifen Hives   Penicillins Hives, Rash, Other (See Comments)   Has patient had a PCN reaction causing immediate rash, facial/tongue/throat swelling, SOB or lightheadedness with hypotension: Unknown Has patient had a PCN reaction causing severe rash involving mucus membranes or skin necrosis: Unknown Has patient had a PCN reaction that required hospitalization: Unknown Has patient had a PCN reaction occurring within the last 10 years: No If all of the above answers are "NO", then may proceed with Cephalosporin use.      Medication List    TAKE these medications   acetaminophen 500 MG tablet Commonly known as:  TYLENOL Take 2 tablets (1,000 mg total) by mouth every 8 (eight) hours. What changed:  when to take this   amLODipine 5 MG tablet Commonly known as:  NORVASC Take 5 mg by mouth daily.   aspirin EC 325 MG tablet Take 1 tablet (325 mg total) by mouth daily for 14 days.   cefdinir 300 MG capsule Commonly known as:  OMNICEF Take 1 capsule (300 mg total) by mouth daily.   citalopram 10 MG tablet Commonly known as:  CELEXA Take 10 mg  by mouth at bedtime.   diphenhydrAMINE 25 mg capsule Commonly known as:  BENADRYL Take 1 capsule (25 mg total) by mouth every 6 (six) hours as needed (allergy symptoms).   guaiFENesin 100 MG/5ML Soln Commonly known as:  ROBITUSSIN Take 5 mLs (100 mg total) by mouth every 4 (four) hours as needed for cough or to loosen phlegm.   hydrochlorothiazide 25 MG tablet Commonly known  as:  HYDRODIURIL Take 25 mg by mouth daily.   levothyroxine 75 MCG tablet Commonly known as:  SYNTHROID, LEVOTHROID Take 75 mcg by mouth daily.   losartan 50 MG tablet Commonly known as:  COZAAR Take 50 mg by mouth daily.   omeprazole 20 MG capsule Commonly known as:  PRILOSEC Take 20 mg by mouth daily.   ondansetron 4 MG disintegrating tablet Commonly known as:  ZOFRAN-ODT Take 1 tablet (4 mg total) by mouth every 8 (eight) hours as needed for nausea or vomiting.   oxyCODONE 5 MG immediate release tablet Commonly known as:  Oxy IR/ROXICODONE Take 1-2 tablets (5-10 mg total) by mouth every 4 (four) hours as needed (pain).   potassium chloride 10 MEQ tablet Commonly known as:  K-DUR,KLOR-CON Take 20 mEq by mouth daily.   VITAMIN B-12 PO Take by mouth daily.        DISCHARGE INSTRUCTIONS:  See AVS.  If you experience worsening of your admission symptoms, develop shortness of breath, life threatening emergency, suicidal or homicidal thoughts you must seek medical attention immediately by calling 911 or calling your MD immediately  if symptoms less severe.  You Must read complete instructions/literature along with all the possible adverse reactions/side effects for all the Medicines you take and that have been prescribed to you. Take any new Medicines after you have completely understood and accpet all the possible adverse reactions/side effects.   Please note  You were cared for by a hospitalist during your hospital stay. If you have any questions about your discharge medications or the care you received while you were in the hospital after you are discharged, you can call the unit and asked to speak with the hospitalist on call if the hospitalist that took care of you is not available. Once you are discharged, your primary care physician will handle any further medical issues. Please note that NO REFILLS for any discharge medications will be authorized once you are  discharged, as it is imperative that you return to your primary care physician (or establish a relationship with a primary care physician if you do not have one) for your aftercare needs so that they can reassess your need for medications and monitor your lab values.    On the day of Discharge:  VITAL SIGNS:  Blood pressure (!) 138/59, pulse 70, temperature 98.9 F (37.2 C), temperature source Axillary, resp. rate 18, height 5\' 1"  (1.549 m), weight 96.8 kg, SpO2 92 %. PHYSICAL EXAMINATION:  GENERAL:  77 y.o.-year-old patient lying in the bed with no acute distress.  EYES: Pupils equal, round, reactive to light and accommodation. No scleral icterus. Extraocular muscles intact.  HEENT: Head atraumatic, normocephalic. Oropharynx and nasopharynx clear.  NECK:  Supple, no jugular venous distention. No thyroid enlargement, no tenderness.  LUNGS: Normal breath sounds bilaterally, no wheezing, rales,rhonchi or crepitation. No use of accessory muscles of respiration.  CARDIOVASCULAR: S1, S2 normal. No murmurs, rubs, or gallops.  ABDOMEN: Soft, non-tender, non-distended. Bowel sounds present. No organomegaly or mass.  EXTREMITIES: No pedal edema, cyanosis, or clubbing.  Left shoulder on  sling. NEUROLOGIC: Cranial nerves II through XII are intact. Muscle strength 5/5 in all extremities. Sensation intact. Gait not checked.  PSYCHIATRIC: The patient is alert and oriented x 3.  SKIN: No obvious rash, lesion, or ulcer.  DATA REVIEW:   CBC Recent Labs  Lab 07/27/18 2018  WBC 12.3*  HGB 12.5  HCT 35.0  PLT 296    Chemistries  Recent Labs  Lab 07/27/18 2018  NA 136  K 3.7  CL 96*  CO2 26  GLUCOSE 103*  BUN 17  CREATININE 1.81*  CALCIUM 9.5  AST 91*  ALT 75*  ALKPHOS 97  BILITOT 0.8     Microbiology Results  Results for orders placed or performed during the hospital encounter of 07/27/18  Culture, blood (Routine x 2)     Status: None (Preliminary result)   Collection Time: 07/27/18   8:18 PM  Result Value Ref Range Status   Specimen Description BLOOD RIGHT WRIST  Final   Special Requests   Final    BOTTLES DRAWN AEROBIC AND ANAEROBIC Blood Culture adequate volume   Culture   Final    NO GROWTH 3 DAYS Performed at [facility], [address] [place], [place] [address]    Report Status PENDING  Incomplete  Culture, blood (Routine x 2)     Status: None (Preliminary result)   Collection Time: 07/27/18  8:18 PM  Result Value Ref Range Status   Specimen Description BLOOD RIGHT FA  Final   Special Requests   Final    BOTTLES DRAWN AEROBIC AND ANAEROBIC Blood Culture adequate volume   Culture   Final    NO GROWTH 3 DAYS Performed at [facility], [address], [place], [place] [address]    Report Status PENDING  Incomplete  MRSA PCR Screening     Status: None   Collection Time: 07/28/18  1:09 AM  Result Value Ref Range Status   MRSA by PCR NEGATIVE NEGATIVE Final    Comment:        The GeneXpert MRSA Assay (FDA approved for NASAL specimens only), is one component of a comprehensive MRSA colonization surveillance program. It is not intended to diagnose MRSA infection nor to guide or monitor treatment for MRSA infections. Performed at [facility], [address], [place], [place] [address]     RADIOLOGY:  No results found.   Management plans discussed with the patient, family and they are in agreement.  CODE STATUS: Full Code   TOTAL TIME TAKING CARE OF THIS PATIENT: 33 minutes.    Demetrios Loll M.D on 07/30/2018 at 12:52 PM  Between 7am to 6pm - Pager - [phone]  After 6pm go to [email or web address] - Proofreader  [facility]  Office  [phone]  CC: Primary care physician; [name], MD   Note: This dictation was prepared with Dragon dictation along with smaller phrase technology. Any transcriptional errors that result from this process are unintentional.

## 2018-07-30 NOTE — Progress Notes (Signed)
Patient currently on cefepime. Per conversation with attending will change to [facility]. First dose ordered while still admitted to ensure tolerability.

## 2018-08-01 LAB — CULTURE, BLOOD (ROUTINE X 2)
CULTURE: NO GROWTH
Culture: NO GROWTH
SPECIAL REQUESTS: ADEQUATE
SPECIAL REQUESTS: ADEQUATE

## 2018-08-07 ENCOUNTER — Other Ambulatory Visit: Payer: Self-pay | Admitting: *Deleted

## 2018-08-07 DIAGNOSIS — K219 Gastro-esophageal reflux disease without esophagitis: Secondary | ICD-10-CM | POA: Insufficient documentation

## 2018-08-07 DIAGNOSIS — B029 Zoster without complications: Secondary | ICD-10-CM | POA: Insufficient documentation

## 2018-08-07 DIAGNOSIS — I10 Essential (primary) hypertension: Secondary | ICD-10-CM | POA: Insufficient documentation

## 2018-08-07 DIAGNOSIS — E079 Disorder of thyroid, unspecified: Secondary | ICD-10-CM | POA: Insufficient documentation

## 2018-08-07 NOTE — Patient Outreach (Addendum)
[facility]) Care Management  08/07/2018  Latoya Freeman [birth year] [id number]   EMMI-general discharge Miller d/c home with [facility] (PT, RN)  RED ON [place] Day # 4 Date: 08/04/18 1652  Red Alert Reason: sad/hopeless/anxious/empty? yes    Outreach attempt # 1 successful to her home number  Patient is able to verify HIPAA [facility] Care Management RN reviewed and addressed red alert with patient Latoya Freeman reports she is doing well She states the answer "sad/hopeless/anxious/empty? Yes" is incorrect She denies feeling sad/hopeless/anxious/empty  She confirms [facility] has come to visit her and therapies will begin soon  Social: divorced female who states she has good support system and is not needing assist with ADLs and iADLs Conditions: HCAP, aneurysm, left breast cancer 2013, GERD, HTN, Hypothyroidism, shingles, dentures full upper and lower, left shoulder surgery Medications: flu 09/26/17 She has her antibiotics denies concerns with taking medications as prescribed, affording medications, side effects of medications and questions about medications  Appointments: f/u with Dr Kary Kos on 08/15/18 pending  Advance Directives: Denies need for assist with or assist with changes for advance directives   Consent: [facility] RN CM reviewed [facility] services with patient. Patient gave verbal consent for services. Denies need for assist with or assist with changes for advance directives   Advised patient that there will be further automated EMMI- post discharge calls to assess how the patient is doing following the recent hospitalization Advised the patient that another call may be received from a nurse if any of their responses were abnormal. Patient voiced understanding and was appreciative of f/u call.  Transition of care services noted to be completed by primary care MD office staff  Plan: [facility] RN CM will close case at this time as patient has been assessed and no needs  identified.   [facility] RN CM sent a successful outreach letter as discussed with [facility] brochure enclosed for review   Latoya Southard L. Lavina Hamman, RN, BSN, [place] Coordinator Office number [phone] Mobile number [phone]  Main [facility] number [phone] Fax number [phone]

## 2018-11-10 ENCOUNTER — Other Ambulatory Visit: Payer: Self-pay

## 2018-11-10 ENCOUNTER — Emergency Department: Payer: Medicare HMO

## 2018-11-10 ENCOUNTER — Encounter: Payer: Self-pay | Admitting: Radiology

## 2018-11-10 ENCOUNTER — Emergency Department
Admission: EM | Admit: 2018-11-10 | Discharge: 2018-11-10 | DRG: 176 | Disposition: A | Discharge status: Home / Self Care | Payer: Medicare HMO | Attending: Emergency Medicine | Admitting: Emergency Medicine

## 2018-11-10 ENCOUNTER — Other Ambulatory Visit
Admission: RE | Admit: 2018-11-10 | Discharge: 2018-11-10 | Disposition: A | Discharge status: Home / Self Care | Payer: Medicare HMO | Source: Ambulatory Visit | Attending: Pediatrics | Admitting: Pediatrics

## 2018-11-10 DIAGNOSIS — E039 Hypothyroidism, unspecified: Secondary | ICD-10-CM | POA: Diagnosis not present

## 2018-11-10 DIAGNOSIS — I2699 Other pulmonary embolism without acute cor pulmonale: Secondary | ICD-10-CM

## 2018-11-10 DIAGNOSIS — Z79899 Other long term (current) drug therapy: Secondary | ICD-10-CM | POA: Diagnosis not present

## 2018-11-10 DIAGNOSIS — R42 Dizziness and giddiness: Secondary | ICD-10-CM | POA: Diagnosis not present

## 2018-11-10 DIAGNOSIS — Z87891 Personal history of nicotine dependence: Secondary | ICD-10-CM | POA: Diagnosis not present

## 2018-11-10 DIAGNOSIS — R0602 Shortness of breath: Secondary | ICD-10-CM | POA: Diagnosis present

## 2018-11-10 DIAGNOSIS — I10 Essential (primary) hypertension: Secondary | ICD-10-CM | POA: Insufficient documentation

## 2018-11-10 DIAGNOSIS — Z853 Personal history of malignant neoplasm of breast: Secondary | ICD-10-CM | POA: Diagnosis not present

## 2018-11-10 LAB — BASIC METABOLIC PANEL
Anion gap: 10 (ref 5–15)
BUN: 13 mg/dL (ref 8–23)
CHLORIDE: 103 mmol/L (ref 98–111)
CO2: 26 mmol/L (ref 22–32)
Calcium: 10.4 mg/dL — ABNORMAL HIGH (ref 8.9–10.3)
Creatinine, Ser: 1.29 mg/dL — ABNORMAL HIGH (ref 0.44–1.00)
GFR calc Af Amer: 47 mL/min — ABNORMAL LOW (ref >60)
GFR calc non Af Amer: 40 mL/min — ABNORMAL LOW (ref >60)
Glucose, Bld: 96 mg/dL (ref 70–99)
Potassium: 3.6 mmol/L (ref 3.5–5.1)
Sodium: 139 mmol/L (ref 135–145)

## 2018-11-10 LAB — TROPONIN I: Troponin I: <0.03 ng/mL (ref <0.03)

## 2018-11-10 LAB — CBC
HEMATOCRIT: 42.7 % (ref 36.0–46.0)
Hemoglobin: 14 g/dL (ref 12.0–15.0)
MCH: 28.7 pg (ref 26.0–34.0)
MCHC: 32.8 g/dL (ref 30.0–36.0)
MCV: 87.5 fL (ref 80.0–100.0)
Platelets: 289 10*3/uL (ref 150–400)
RBC: 4.88 MIL/uL (ref 3.87–5.11)
RDW: 12.9 % (ref 11.5–15.5)
WBC: 7.9 10*3/uL (ref 4.0–10.5)
nRBC: 0 % (ref 0.0–0.2)

## 2018-11-10 LAB — FIBRIN DERIVATIVES D-DIMER (ARMC ONLY): FIBRIN DERIVATIVES D-DIMER (ARMC): 1097.3 ng{FEU}/mL — AB (ref 0.00–499.00)

## 2018-11-10 MED ORDER — IOPAMIDOL (ISOVUE-370) INJECTION 76%
60.0000 mL | Freq: Once | INTRAVENOUS | Status: AC | PRN
  Administered 2018-11-10: 60 mL via INTRAVENOUS

## 2018-11-10 MED ORDER — APIXABAN 5 MG PO TABS
10.0000 mg | ORAL_TABLET | Freq: Two times a day (BID) | ORAL | Status: DC
  Administered 2018-11-10: 10 mg via ORAL
  Filled 2018-11-10: qty 2

## 2018-11-10 NOTE — ED Notes (Signed)
Pt and pt's daughter verbalize understanding of discharge instructions

## 2018-11-10 NOTE — ED Notes (Signed)
Discussed with family and patient return precautions and informed patient to call for follow up appt with PCP on Monday.  Also discussed increased bleeding risk with Eliquis as well.

## 2018-11-10 NOTE — ED Provider Notes (Addendum)
[facility] Emergency Department Provider Note   ____________________________________________   First MD Initiated Contact with Patient 11/10/18 1757     (approximate)  I have reviewed the triage vital signs and the nursing notes.   HISTORY  Chief Complaint Shortness of Breath    HPI Latoya Freeman is a 77 y.o. female patient reports several days of shortness of breath with exertion.  Not particularly getting worse.  Not particularly associated with any chest pain.  Past history as below.   Past Medical History:  Diagnosis Date  . Aneurysm ([place]) 1980  . Breast cancer ([place]) 2013  . Cancer ([place])   . GERD (gastroesophageal reflux disease)   . Hypertension 1980  . Hypothyroidism 1999  . Personal history of malignant neoplasm of breast    LF Breast   . Shingles   . Wears dentures    full upper and lower    Patient Active Problem List   Diagnosis Date Noted  . GERD (gastroesophageal reflux disease) 08/07/2018  . Hypertension 08/07/2018  . Shingles 08/07/2018  . Thyroid disease 08/07/2018  . HCAP (healthcare-associated pneumonia) 07/27/2018    Past Surgical History:  Procedure Laterality Date  . BRAIN SURGERY    . BREAST CYST ASPIRATION Left 2005  . BREAST EXCISIONAL BIOPSY Left 2013   atypical ductal hyperplasia  . BREAST SURGERY  2013   LF Breast Wide EXC   . CHOLECYSTECTOMY  2004  . COLONOSCOPY    . COLONOSCOPY WITH PROPOFOL N/A 01/31/2018   Procedure: COLONOSCOPY WITH PROPOFOL;  Surgeon: Lollie Sails, MD;  Location: [facility] ENDOSCOPY;  Service: Endoscopy;  Laterality: N/A;  . ESOPHAGOGASTRODUODENOSCOPY N/A 01/31/2018   Procedure: ESOPHAGOGASTRODUODENOSCOPY (EGD);  Surgeon: Lollie Sails, MD;  Location: [facility] ENDOSCOPY;  Service: Endoscopy;  Laterality: N/A;  . 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 Authorizing Provider  acetaminophen (TYLENOL) 500 MG tablet Take 2 tablets (1,000 mg total) by mouth every 8 (eight) hours. Patient taking differently: Take 1,000 mg by mouth every 4 (four) hours.  07/25/18 07/25/19  Leim Fabry, MD  amLODipine (NORVASC) 5 MG tablet Take 5 mg by mouth daily.  02/07/13   [provider]  cefdinir (OMNICEF) 300 MG capsule Take 1 capsule (300 mg total) by mouth daily. 07/30/18   Demetrios Loll, MD  citalopram (CELEXA) 10 MG tablet Take 10 mg by mouth at bedtime.     [provider]  Cyanocobalamin (VITAMIN B-12 PO) Take by mouth daily.    [provider]  diphenhydrAMINE (BENADRYL) 25 mg capsule Take 1 capsule (25 mg total) by mouth every 6 (six) hours as needed (allergy symptoms). 06/04/15 07/20/18  Loney Hering, MD  guaiFENesin (ROBITUSSIN) 100 MG/5ML SOLN Take 5 mLs (100 mg total) by mouth every 4 (four) hours as needed for cough or to loosen phlegm. 07/30/18   Demetrios Loll, MD  hydrochlorothiazide (HYDRODIURIL) 25 MG tablet Take 25 mg by mouth daily.  01/25/13   [provider]  levothyroxine (SYNTHROID, LEVOTHROID) 75 MCG tablet Take 75 mcg by mouth daily.    [provider]  losartan (COZAAR) 50 MG tablet  Take 50 mg by mouth daily.     [provider]  omeprazole (PRILOSEC) 20 MG capsule Take 20 mg by mouth daily.    [provider]  ondansetron (ZOFRAN ODT) 4 MG disintegrating tablet Take 1 tablet (4 mg total) by mouth every 8 (eight) hours as needed for nausea or vomiting. 07/25/18   Leim Fabry, MD  oxyCODONE (ROXICODONE) 5 MG immediate release tablet Take 1-2 tablets (5-10 mg total) by mouth every 4 (four) hours as needed (pain). 07/25/18 07/25/19   Leim Fabry, MD  potassium chloride (K-DUR,KLOR-CON) 10 MEQ tablet Take 20 mEq by mouth daily. 07/12/18   [provider]    Allergies Bee venom; Shellfish allergy; Tamoxifen; and Penicillins  Family History  Problem Relation Age of Onset  . Breast cancer Neg Hx     Social History Social History   Tobacco Use  . Smoking status: Former Smoker    Packs/day: 1.50    Years: 20.00    Pack years: 30.00    Last attempt to quit: 1990    Years since quitting: 29.9  . Smokeless tobacco: Never Used  Substance Use Topics  . Alcohol use: No  . Drug use: No    Review of Systems  Constitutional: No fever/chills Eyes: No visual changes. ENT: No sore throat. Cardiovascular: Denies chest pain. Respiratory: shortness of breath. Gastrointestinal: No abdominal pain.  No nausea, no vomiting.  No diarrhea.  No constipation. Genitourinary: Negative for dysuria. Musculoskeletal: Negative for back pain. Skin: Negative for rash. Neurological: Negative for headaches, focal weakness   ____________________________________________   PHYSICAL EXAM:  VITAL SIGNS: ED Triage Vitals  Enc Vitals Group     BP 11/10/18 1532 130/71     Pulse Rate 11/10/18 1532 74     Resp 11/10/18 1532 16     Temp 11/10/18 1532 98.5 F (36.9 C)     Temp src --      SpO2 11/10/18 1532 96 %     Weight 11/10/18 1527 189 lb (85.7 kg)     Height 11/10/18 1527 5\' 1"  (1.549 m)     Head Circumference --      Peak Flow --      Pain Score 11/10/18 1526 0     Pain Loc --      Pain Edu? --      Excl. in [place]? --    Constitutional: Alert and oriented. Well appearing and in no acute distress. Eyes: Conjunctivae are normal.  Head: Atraumatic. Nose: No congestion/rhinnorhea. Mouth/Throat: Mucous membranes are moist.  Oropharynx non-erythematous. Neck: No stridor Cardiovascular: Normal rate, regular rhythm. Grossly normal heart sounds.  Good peripheral circulation. Respiratory: Normal respiratory effort.  No  retractions. Lungs CTAB. Gastrointestinal: Soft and nontender. No distention. No abdominal bruits. No CVA tenderness. Musculoskeletal: No lower extremity tenderness trace bilateral edema.   Neurologic:  Normal speech and language. No gross focal neurologic deficits are appreciated.  Skin:  Skin is warm, dry and intact. No rash noted. Psychiatric: Mood and affect are normal. Speech and behavior are normal.  ____________________________________________   LABS (all labs ordered are listed, but only abnormal results are displayed)  Labs Reviewed  BASIC METABOLIC PANEL - Abnormal; Notable for the following components:      Result Value   Creatinine, Ser 1.29 (*)    Calcium 10.4 (*)    GFR calc non Af Amer 40 (*)    GFR calc Af Amer 47 (*)    All other components within normal  limits  CBC  TROPONIN I   ____________________________________________  EKG  KG read and interpreted by me shows normal sinus rhythm rate of 75 left axis no acute ST-T wave changes there is diffuse T wave flattening. ____________________________________________  RADIOLOGY  ED MD interpretation: Chest x-ray read by radiology reviewed by me shows mild cardiomegaly CT of the chest read by radiology shows segmental pulmonary emboli  Official radiology report(s): Dg Chest 2 View  Result Date: 11/10/2018 CLINICAL DATA:  Shortness of breath and upper back pain for 3 4 days. EXAM: CHEST - 2 VIEW COMPARISON:  One-view chest x-ray 07/27/2018 FINDINGS: The heart is mildly enlarged. Atherosclerotic calcifications are present in the aorta. There is no edema or effusion to suggest failure. Lung volumes are low. Mild bibasilar atelectasis is present. IMPRESSION: 1. Mild cardiomegaly without failure. 2. Low lung volumes and mild bibasilar atelectasis. Electronically Signed   By: San Morelle M.D.   On: 11/10/2018 16:22   Ct Angio Chest Pe W And/or Wo Contrast  Result Date: 11/10/2018 CLINICAL DATA:  77 y/o F; 3-4  days of shortness of breath. Elevated D-dimer. EXAM: CT ANGIOGRAPHY CHEST WITH CONTRAST TECHNIQUE: Multidetector CT imaging of the chest was performed using the standard protocol during bolus administration of intravenous contrast. Multiplanar CT image reconstructions and MIPs were obtained to evaluate the vascular anatomy. CONTRAST:  72mL ISOVUE-370 IOPAMIDOL (ISOVUE-370) INJECTION 76% COMPARISON:  None. FINDINGS: Cardiovascular: Satisfactory opacification of the pulmonary arteries to the segmental level. Acute segmental pulmonary emboli are present in the left upper lobe lingula and left lower lobe (series 5 image 98, 126, 145), RV/LV = 0.62. Normal heart size. No pericardial effusion. Moderate aortic and coronary artery calcific atherosclerosis. Mediastinum/Nodes: No enlarged mediastinal, hilar, or axillary lymph nodes. Thyroid gland, trachea, and esophagus demonstrate no significant findings. Lungs/Pleura: Lungs are clear. No pleural effusion or pneumothorax. Upper Abdomen: Nonspecific hypodensities at the periphery of the liver, likely cysts. Musculoskeletal: No chest wall abnormality. No acute or significant osseous findings. Review of the MIP images confirms the above findings. IMPRESSION: 1. Acute segmental pulmonary emboli in left upper lobe lingula and left lower lobe. RV/LV = 0.62, no findings of right heart strain. 2. Moderate aortic and coronary artery calcific atherosclerosis. Critical Value/emergent results were called by telephone at the time of interpretation on 11/10/2018 at 5:18 pm to Dr. Carrie Mew , who verbally acknowledged these results. Electronically Signed   By: Kristine Garbe M.D.   On: 11/10/2018 17:20    ____________________________________________   PROCEDURES  Procedure(s) performed:   Procedures  Critical Care performed:   ____________________________________________   INITIAL IMPRESSION / ASSESSMENT AND PLAN / ED COURSE  Patient clinically stable.   No heart strain on CT.  Multiple pulmonary emboli.  No sign of heart strain.  Discussed with hospitalists.  Will start on p.o. Eliquis 10 mg twice a day for a week and then decrease to 5 mg twice a day she will follow-up with her regular doctor.  Return for any increasing chest pain shortness of breath or any bleeding or other problems.         ____________________________________________   FINAL CLINICAL IMPRESSION(S) / ED DIAGNOSES  Final diagnoses:  Multiple subsegmental pulmonary emboli without acute cor pulmonale     ED Discharge Orders    None       Note:  This document was prepared using Dragon voice recognition software and may include unintentional dictation errors.    Nena Polio, MD 11/10/18 [phone]  Nena Polio, MD 11/10/18 [phone]

## 2018-11-10 NOTE — Discharge Instructions (Addendum)
We will discharge you on Eliquis.  That will be 10 mg twice a day for a week and then 5 mg twice a day.  Please follow-up with your regular doctor in the next few days to get the continuation of this prescription which will have to go on for at least 3 months.  Please return at once for any bleeding.  Please also return for any increasing shortness of breath or chest pain or if you feel worse at all.

## 2018-11-10 NOTE — ED Triage Notes (Addendum)
Pt states SOB x 3-4 days. Denies CHF or COPD. Talking in complete sentences. No  resp distress noted. Mask in place. Denies congestion or fever, cough "every once in a while". C/o back pain "every once in a while. No pain at this time.   Had elevated D-dimer at [facility]. Was 1097.30. also paper work states abnormal EKG

## 2018-11-28 ENCOUNTER — Other Ambulatory Visit: Payer: Self-pay | Admitting: Family Medicine

## 2018-11-28 DIAGNOSIS — M79662 Pain in left lower leg: Secondary | ICD-10-CM

## 2018-11-30 ENCOUNTER — Ambulatory Visit
Admission: RE | Admit: 2018-11-30 | Discharge: 2018-11-30 | Disposition: A | Discharge status: Home / Self Care | Payer: Medicare HMO | Source: Ambulatory Visit | Attending: Family Medicine | Admitting: Family Medicine

## 2018-11-30 DIAGNOSIS — M79662 Pain in left lower leg: Secondary | ICD-10-CM | POA: Diagnosis present

## 2019-03-20 ENCOUNTER — Other Ambulatory Visit: Payer: Self-pay

## 2019-03-20 ENCOUNTER — Ambulatory Visit (INDEPENDENT_AMBULATORY_CARE_PROVIDER_SITE_OTHER): Payer: Medicare HMO | Admitting: Certified Nurse Midwife

## 2019-03-20 ENCOUNTER — Encounter: Payer: Self-pay | Admitting: Certified Nurse Midwife

## 2019-03-20 VITALS — BP 128/64 | Ht 61.0 in | Wt 195.0 lb

## 2019-03-20 DIAGNOSIS — R32 Unspecified urinary incontinence: Secondary | ICD-10-CM | POA: Diagnosis not present

## 2019-03-20 DIAGNOSIS — R319 Hematuria, unspecified: Secondary | ICD-10-CM

## 2019-03-20 LAB — POCT URINALYSIS DIPSTICK
Bilirubin, UA: NEGATIVE
Glucose, UA: NEGATIVE
Nitrite, UA: NEGATIVE
Protein, UA: NEGATIVE
Spec Grav, UA: <=1.005 — AB (ref 1.010–1.025)
Urobilinogen, UA: NEGATIVE E.U./dL — AB
pH, UA: 6 (ref 5.0–8.0)

## 2019-03-20 MED ORDER — NITROFURANTOIN MONOHYD MACRO 100 MG PO CAPS: 100.0000 mg | ORAL_CAPSULE | Freq: Two times a day (BID) | ORAL | 0 refills | Status: DC

## 2019-03-22 ENCOUNTER — Encounter: Payer: Self-pay | Admitting: Certified Nurse Midwife

## 2019-03-22 DIAGNOSIS — I2699 Other pulmonary embolism without acute cor pulmonale: Secondary | ICD-10-CM | POA: Insufficient documentation

## 2019-03-22 LAB — URINE CULTURE

## 2019-03-22 NOTE — Progress Notes (Signed)
Obstetrics & Gynecology Office Visit   Chief Complaint:  Chief Complaint  Patient presents with  . Vaginal Bleeding    pt thinks bladder has dropped so she decided to check with her hand and when she did she noticed blood, this has happened twice since yesterday    History of Present Illness: 78 year old WF presents as a NP with complaints of noticing blood on her hand when she touched her vulvovaginal area after voiding 2 days ago. She also  reports worsening urinary incontinence over the past week. Has been on [place] for many years with relief of her urgency incontinence until just recently. She has also felt more bladder  pressure over the past week. Has not noticed any further bleeding. Denies dysuria, vulvovaginal itching, vaginal discharge.   She has not had any past gynecological problems. Was on hormone replacement for a short time when she first stopped having menses.   Her ast medical history is remarkable for hypertension, hypothyroidism, breast cancer and in the past 6 months was hospitalized for left shoulder surgery, then pneumonia, then pulmonary emboli. She is currently on Eliquis and will continue on Eliquis for another 3 months.  Review of Systems:  Review of Systems  Constitutional: Negative for chills, fever and weight loss.  HENT: Negative for congestion, sinus pain and sore throat.   Eyes: Negative for blurred vision and pain.  Respiratory: Negative for hemoptysis, shortness of breath and wheezing.   Cardiovascular: Negative for chest pain, palpitations and leg swelling.  Gastrointestinal: Negative for abdominal pain, blood in stool, diarrhea, heartburn, nausea and vomiting.  Genitourinary: Negative for dysuria, frequency, hematuria and urgency.       Positive for urinary incontinence and bladder pressure. Possible hematuria vs vaginal bleeding.  Musculoskeletal: Negative for back pain, joint pain and myalgias.  Skin: Positive for itching. Negative for rash.   Neurological: Negative for dizziness, tingling and headaches.  Endo/Heme/Allergies: Negative for environmental allergies and polydipsia. Does not bruise/bleed easily.       Negative for hirsutism   Psychiatric/Behavioral: Negative for depression. The patient is nervous/anxious. The patient does not have insomnia.      Past Medical History:  Past Medical History:  Diagnosis Date  . Aneurysm ([place]) 1980  . Breast cancer ([place]) 2013  . Cancer ([place])   . GERD (gastroesophageal reflux disease)   . Hypertension 1980  . Hypothyroidism 1999  . Personal history of malignant neoplasm of breast    LF Breast   . Pulmonary embolism ([place])   . Shingles   . Wears dentures    full upper and lower    Past Surgical History:  Past Surgical History:  Procedure Laterality Date  . BRAIN SURGERY    . BREAST CYST ASPIRATION Left 2005  . BREAST EXCISIONAL BIOPSY Left 2013   atypical ductal hyperplasia  . BREAST SURGERY  2013   LF Breast Wide EXC   . CHOLECYSTECTOMY  2004  . COLONOSCOPY    . COLONOSCOPY WITH PROPOFOL N/A 01/31/2018   Procedure: COLONOSCOPY WITH PROPOFOL;  Surgeon: Lollie Sails, MD;  Location: [facility] ENDOSCOPY;  Service: Endoscopy;  Laterality: N/A;  . ESOPHAGOGASTRODUODENOSCOPY N/A 01/31/2018   Procedure: ESOPHAGOGASTRODUODENOSCOPY (EGD);  Surgeon: Lollie Sails, MD;  Location: [facility] ENDOSCOPY;  Service: Endoscopy;  Laterality: N/A;  . SHOULDER ARTHROSCOPY Left 07/25/2018   Procedure: SHOULDER MINI OPEN ROTATOR CUFF REPAIR  BICEPS TENDOSIS ARTHROSCOPIC DISTAL CLAVICLE EXCISION  SUBACROMIAL DECOMP;  Surgeon: Leim Fabry, MD;  Location: [place];  Service: Orthopedics;  Laterality: Left;  BEACH CHAIR WITH SPYDER SMITH & NEPHEW HEAD COIL ANCHOR FOOTPRINT ANCHOR QFIX ANCHOR    Gynecologic History: No LMP recorded. Patient is postmenopausal.  Obstetric History: G3P0  Family History:  Family History  Problem Relation Age of Onset  . Breast cancer Neg Hx      Social History:  Social History   Socioeconomic History  . Marital status: Divorced    Spouse name: Not on file  . Number of children: Not on file  . Years of education: Not on file  . Highest education level: Not on file  Occupational History  . Not on file  Social Needs  . Financial resource strain: Not on file  . Food insecurity:    Worry: Not on file    Inability: Not on file  . Transportation needs:    Medical: Not on file    Non-medical: Not on file  Tobacco Use  . Smoking status: Former Smoker    Packs/day: 1.50    Years: 20.00    Pack years: 30.00    Last attempt to quit: 1990    Years since quitting: 30.3  . Smokeless tobacco: Never Used  Substance and Sexual Activity  . Alcohol use: No  . Drug use: No  . Sexual activity: Not on file  Lifestyle  . Physical activity:    Days per week: Not on file    Minutes per session: Not on file  . Stress: Not on file  Relationships  . Social connections:    Talks on phone: Not on file    Gets together: Not on file    Attends religious service: Not on file    Active member of club or organization: Not on file    Attends meetings of clubs or organizations: Not on file    Relationship status: Not on file  . Intimate partner violence:    Fear of current or ex partner: Not on file    Emotionally abused: Not on file    Physically abused: Not on file    Forced sexual activity: Not on file  Other Topics Concern  . Not on file  Social History Narrative  . Not on file    Allergies:  Allergies  Allergen Reactions  . Bee Venom Swelling  . Shellfish Allergy Swelling  . Tamoxifen Hives  . Penicillins Hives, Rash and Other (See Comments)    Has patient had a PCN reaction causing immediate rash, facial/tongue/throat swelling, SOB or lightheadedness with hypotension: Unknown Has patient had a PCN reaction causing severe rash involving mucus membranes or skin necrosis: Unknown Has patient had a PCN reaction that required  hospitalization: Unknown Has patient had a PCN reaction occurring within the last 10 years: No If all of the above answers are "NO", then may proceed with Cephalosporin use.     Medications:Eliquis 5 mgm BID + Current Outpatient Medications on File Prior to Visit  Medication Sig Dispense Refill  . acetaminophen (TYLENOL) 500 MG tablet Take 2 tablets (1,000 mg total) by mouth every 8 (eight) hours. (Patient taking differently: Take 1,000 mg by mouth every 4 (four) hours. ) 90 tablet 2  . amLODipine (NORVASC) 5 MG tablet Take 5 mg by mouth daily.     . citalopram (CELEXA) 10 MG tablet Take 10 mg by mouth at bedtime.     . Cyanocobalamin (VITAMIN B-12 PO) Take 1 tablet by mouth daily.     . hydrochlorothiazide (HYDRODIURIL) 25 MG tablet Take  25 mg by mouth daily.     Marland Kitchen levothyroxine (SYNTHROID, LEVOTHROID) 75 MCG tablet Take 75 mcg by mouth daily.    Marland Kitchen losartan (COZAAR) 50 MG tablet Take 50 mg by mouth daily.     Marland Kitchen omeprazole (PRILOSEC) 20 MG capsule Take 20 mg by mouth daily.    . potassium chloride (K-DUR,KLOR-CON) 10 MEQ tablet Take 20 mEq by mouth daily.  3  . Trospium Chloride 60 MG CP24 TAKE 1 CAPSULE BY MOUTH ONCE DAILY IN THE MORNING BEFORE BREAKFAST     No current facility-administered medications on file prior to visit.        Physical Exam Vitals: BP 128/64   Ht 5\' 1"  (1.549 m)   Wt 195 lb (88.5 kg)   BMI 36.84 kg/m        Physical Exam  Constitutional: She is oriented to person, place, and time.  WF who appears her stated age  Cardiovascular: Normal rate.  Respiratory: Effort normal.  GI: Soft. She exhibits no distension and no mass. There is no abdominal tenderness. There is no guarding.  Genitourinary:    Genitourinary Comments: Vulva: no lesions or inflammation Vagina: mild rectocele, flattened rugae, no bleeeding Cervix: no lesions, no bleeding Uterus: RV, NSSC, NT Adnexa: no masses, NT Rectal: external hemorrhoid, no bleeding noted.   Neurological: She  is alert and oriented to person, place, and time.  Skin: Skin is warm and dry.  Psychiatric: She has a normal mood and affect. Judgment and thought content normal.   Results for orders placed or performed in visit on 03/20/19 (from the past 72 hour(s))  POCT Urinalysis Dipstick     Status: Abnormal   Collection Time: 03/20/19 11:48 AM  Result Value Ref Range   Color, UA     Clarity, UA     Glucose, UA Negative Negative   Bilirubin, UA neg    Ketones, UA trace    Spec Grav, UA <=1.005 (A) 1.010 - 1.025   Blood, UA 1+    pH, UA 6.0 5.0 - 8.0   Protein, UA Negative Negative   Urobilinogen, UA negative (A) 0.2 or 1.0 E.U./dL   Nitrite, UA neg    Leukocytes, UA Moderate (2+) (A) Negative   Appearance     Odor      Assessment/ Plan: 78 y.o. with microscopic hematuria and moderate WBCs,  on dipstick, and worsening urinary incontinence R/O UTI. Advised that it is possible that bleeding she noted was PMB or bleeding due to being on Eliquis.  Will treat for bladder infection with Macrobid while awaiting urine culture results. If urine culture is negative, recommend pelvic ultrasound.  Dalia Heading, CNM

## 2019-03-26 ENCOUNTER — Telehealth: Payer: Self-pay | Admitting: Certified Nurse Midwife

## 2019-03-26 DIAGNOSIS — N950 Postmenopausal bleeding: Secondary | ICD-10-CM

## 2019-03-26 NOTE — Telephone Encounter (Signed)
Called patient to follow up and give her urine culture results. Feeling better on Macrobid, less symptomatic of pressure with urination and incontinence. Urine culture was essentially negative.  Discussed with Dr Georgianne Fick. Will schedule for pelvic ultrasound since there is a question of PMB, and follow up with her then. Dalia Heading, CNM

## 2019-03-30 ENCOUNTER — Ambulatory Visit (INDEPENDENT_AMBULATORY_CARE_PROVIDER_SITE_OTHER): Payer: Medicare HMO

## 2019-03-30 ENCOUNTER — Encounter: Payer: Self-pay | Admitting: Certified Nurse Midwife

## 2019-03-30 ENCOUNTER — Ambulatory Visit (INDEPENDENT_AMBULATORY_CARE_PROVIDER_SITE_OTHER): Payer: Medicare HMO | Admitting: Certified Nurse Midwife

## 2019-03-30 ENCOUNTER — Other Ambulatory Visit: Payer: Self-pay

## 2019-03-30 VITALS — BP 122/60 | Ht 61.0 in | Wt 197.0 lb

## 2019-03-30 DIAGNOSIS — N950 Postmenopausal bleeding: Secondary | ICD-10-CM

## 2019-03-30 DIAGNOSIS — N83202 Unspecified ovarian cyst, left side: Secondary | ICD-10-CM | POA: Diagnosis not present

## 2019-03-31 NOTE — Progress Notes (Signed)
°  HPI: 78 year old WF presented as a NP 4/28 with complaints of noticing blood on her hand when she touched her vulvovaginal area after voiding x2. She had also  reported worsening urinary incontinence and suprapubic pressure x 1 week. Her history was also significant for taking Eliquis for pulmonary embolism that was diagnosed months earlier. She had moderate leukocytes and +1 blood in her urine. She was prescribed Macrobid for a possible UTI. She did have relief of her urinary symptoms but did have another episode of bleeding/ spotting  last night. Her urine culture returned no growth. She was asked to come in for a pelvic ultrasound to assess her endometrium    Ultrasound demonstrated an endometrium measuring 4.5 mm (appeared to have irregular borders) and a left ovarian simple cyst measuring around 2 cm.    PMHx: She  has a past medical history of Aneurysm ([place]) (1980), Breast cancer ([place]) (2013), Cancer ([place]), GERD (gastroesophageal reflux disease), Hypertension (1980), Hypothyroidism (1999), Personal history of malignant neoplasm of breast, Pulmonary embolism ([place]), Shingles, and Wears dentures. Also,  has a past surgical history that includes Breast surgery (2013); Cholecystectomy (2004); Breast excisional biopsy (Left, 2013); Breast cyst aspiration (Left, 2005); Colonoscopy; Brain surgery; Esophagogastroduodenoscopy (N/A, 01/31/2018); Colonoscopy with propofol (N/A, 01/31/2018); and Shoulder arthroscopy (Left, 07/25/2018)., family history is not on file.,  reports that she quit smoking about 30 years ago. She has a 30.00 pack-year smoking history. She has never used smokeless tobacco. She reports that she does not drink alcohol or use drugs.  She has a current medication list which includes the following prescription(s): acetaminophen, amlodipine, apixaban, citalopram, cyanocobalamin, hydrochlorothiazide, levothyroxine, losartan, omeprazole, potassium chloride, triamterene-hydrochlorothiazide, trospium  chloride, and albuterol. Also, is allergic to bee venom; shellfish allergy; tamoxifen; and penicillins.  ROS  Objective: BP 122/60    Ht 5\' 1"  (1.549 m)    Wt 197 lb (89.4 kg)    BMI 37.22 kg/m   Physical examination Constitutional NAD, Conversant  Skin No rashes, lesions or ulceration.   Extremities: Limited right knee ROM  Neuro: Grossly intact  Psych: Oriented to PPT.  Normal mood. Normal affect.   Assessment:  Postmenopausal bleeding/spotting with a EM stripe>26mm R/O endometrial hyperplasia. R/O endometrial polyp R/O bleeding from being on Eliquis  Plan: Discussed POM with Dr Kenton Kingfisher, who recommends endometrial sampling Will try endometrial biopsy in office today after discussing plan with Mrs Poynor and obtaining informed consent  Explained purpose of procedure, alternatives, and risks including infection and perforation of uterus Dalia Heading, CNM     Endometrial Biopsy After discussion with the patient regarding her abnormal uterine bleeding I recommended that she proceed with an endometrial biopsy for further diagnosis. The risks, benefits, alternatives, and indications for an endometrial biopsy were discussed with the patient in detail. She understood the risks including infection, bleeding, cervical laceration and uterine perforation.  Written consent was obtained.   PROCEDURE NOTE:  Cervix was prepped with Betadiene x 2 and sprayed with Hurricaine spray. The anterior cervix was grasped with a tenaculum. I attempted inserting the [place] through the cervical opening, but was unsuccessful due to cervical stenosis. The tenaculum was removed and silver nitrate was applied to the tenaculum site for hemostasis.  The patient tolerated the procedure well.  Will refer to Dr Kenton Kingfisher to discuss either attempting endometrial sampling in office or in OR with D&C.   Dalia Heading, CNM

## 2019-04-04 ENCOUNTER — Encounter: Payer: Self-pay | Admitting: Obstetrics & Gynecology

## 2019-04-04 ENCOUNTER — Other Ambulatory Visit: Payer: Self-pay

## 2019-04-04 ENCOUNTER — Ambulatory Visit (INDEPENDENT_AMBULATORY_CARE_PROVIDER_SITE_OTHER): Payer: Medicare HMO | Admitting: Obstetrics & Gynecology

## 2019-04-04 VITALS — BP 100/60 | Ht 61.0 in | Wt 198.0 lb

## 2019-04-04 DIAGNOSIS — N950 Postmenopausal bleeding: Secondary | ICD-10-CM

## 2019-04-04 DIAGNOSIS — N83202 Unspecified ovarian cyst, left side: Secondary | ICD-10-CM | POA: Diagnosis not present

## 2019-04-04 DIAGNOSIS — R9389 Abnormal findings on diagnostic imaging of other specified body structures: Secondary | ICD-10-CM | POA: Diagnosis not present

## 2019-04-04 NOTE — Patient Instructions (Signed)
Multigene Panel Testing for Cancer  What is cancer? Normal cells in the body grow, divide, and are replaced on a routine basis. Sometimes, cells divide abnormally and begin to grow out of control. These cells may form growths or tumors. Tumors can be benign (not cancer) or malignant (cancer). Benign tumors do not spread to other body tissues. Cancer tumors can invade and destroy nearby healthy tissues, bones, and organs. Cancer cells also can spread to other parts of the body and form new cancerous areas.  What causes cancer? Cancer is caused by many different factors. A few types of cancer are caused by changes in genes that can be passed from parent to child. Changes in genes are called mutations. Certain gene mutations are associated with family cancer syndromes. What are family cancer syndromes?  Family cancer syndromes are genetic conditions that increase the risk of certain types of cancer. They also are called hereditary or inherited cancer syndromes. Common family cancer syndromes include hereditary breast and ovarian cancer (HBOC) syndrome, Lynch syndrome, Li-Fraumeni syndrome, Cowden syndrome, and Peutz-Jeghers syndrome. What is genetic testing for cancer? Genetic testing for cancer looks for mutations in certain genes that are known to be linked to cancer. The results can help determine your risk of developing a disease like cancer or passing on a genetic disorder. What is multigene panel testing? Multigene panel testing is a type of genetic testing that looks for mutations in several genes at once. This is different from single-gene testing, which looks for a mutation in a specific gene. Single-gene testing is often used when there is already a known gene mutation in a family. For example, testing for BRCA mutations only looks for changes in BRCA1 and BRCA2 genes. Who should have genetic testing? You may consider genetic testing if your personal or family history shows that you have an  increased risk of cancer. Your obstetrician-gynecologist (ob-gyn) or other health care professional may ask you these and other questions: . Have you or any family members been diagnosed with cancer?  . If yes, which family members were diagnosed, with what types of cancer, and at what ages?  . Were you or any of your family members born with birth defects?  . Are you of Eastern or Central European Jewish ancestry? Depending on your answers, your ob-gyn or other health care professional may suggest that you talk about genetic testing with a genetic counselor or a physician who is an expert in genetics. You can choose to have genetic testing, or you can choose not to. Before you decide, you should have genetic counseling. What is genetic counseling? In genetic counseling, you will talk with a genetic counselor or physician expert about the following: . Your risk of getting a hereditary type of cancer  . Who in your family could potentially get tested  . How testing is done  . What the test results may mean  . What you may do depending on the results How is genetic testing done? Genetic testing typically is done from a blood sample or saliva sample. When is multigene panel testing recommended? Multigene panel testing may be useful if you . are at risk of a family cancer syndrome that has more than one gene associated with it  . have a personal or family history of cancer and single-gene testing has not found a mutation, or the result is uncertain What are the benefits of multigene panel testing? Multigene panel testing looks at multiple genes with one test. If a gene   mutation is found, multigene panel testing may . give you a better understanding of your cancer risk than single-gene testing  . help your health care team decide what cancer screenings you might need beyond routine screenings  . help you think about what you can do to prevent cancer What are the risks of multigene panel  testing? The risks of multigene panel testing may include the following: . Results can be complicated to interpret.  . Testing may find gene mutations that show a moderate or uncertain risk of cancer.  . It may be hard to know what you should do with your test results. You should talk with a genetic counselor or physician expert before and after genetic testing to learn what the results mean. If I have a gene mutation, should I tell my family? Having a gene mutation means you can pass the mutation to your children. Your siblings also may have the gene mutation. Although you do not have to tell your family members, sharing the information could be life-saving for them. With this information, your family members can decide whether to be tested and get cancer screenings at an early age. How can I prevent cancer if I test positive for a gene mutation? If you test positive for a gene mutation, you can discuss cancer screening and prevention options with your ob-gyn, genetic counselor, or other health care professional. It may be helpful to have earlier or more frequent cancer screening tests, which can find cancer at an early and more curable stage. Risk reduction steps like medication, surgery, and lifestyle changes also may be recommended. I'm concerned about discrimination based on genetic testing results. What should I know? Many people are concerned about possible employment discrimination or denial of insurance coverage based on genetic testing results. The Genetic Information Nondiscrimination Act of 2008 (GINA) makes it illegal for health insurers to require genetic testing results or use results to make decisions about coverage, rates, or preexisting conditions. GINA also makes it illegal for employers to discriminate against employees or applicants because of genetic information. GINA does not apply to life insurance, long-term care insurance, or disability insurance. What should I know about  direct-to-consumer genetic tests? Direct-to-consumer (or at-home) genetic tests are sold over the internet. You do not need a doctor's order to get one. The [employer] discourages use of direct-to-consumer genetic tests because the results may be misleading. For example, one commercial test for BRCA mutations only looks for three mutations, even though there are more than 500 BRCA mutations linked to cancer. The test results could cause unnecessary fear, or a false sense that you are not at risk. You should see a health care professional if you want a genetic test.  Glossary BRCA1 and BRCA2: Genes that keep cells from growing too rapidly. Changes in these genes have been linked to an increased risk of breast cancer and ovarian cancer.  Cowden Syndrome: A genetic condition that increases a person's risk of cancer of the breast, thyroid, uterus, colon, kidney, and skin. Genes: Segments of DNA that contain instructions for the development of a person's physical traits and control of the processes in the body. The gene is the basic unit of heredity and can be passed from parent to child. Genetic Counselor: A health care professional with special training in genetics who can provide expert advice about genetic disorders and prenatal testing. Hereditary Breast and Ovarian Cancer (HBOC) Syndrome: A genetic condition that increases a person's risk of cancer  of the breast, ovary, prostate, pancreas, and skin (melanoma). Li-Fraumeni Syndrome: A genetic condition that increases a person's risk of cancer of the breast, bones, soft tissue, brain, and outer layer of the adrenal glands. Lynch Syndrome: A genetic condition that increases a person's risk of cancer of the colon, rectum, ovary, uterus, pancreas, and bile duct. Multigene Panel Testing: A type of genetic test that can look for mutations in multiple genes at once. Mutations: Changes in genes that can be passed from  parent to child. Obstetrician-Gynecologist (Ob-Gyn): A doctor with special training and education in women's health. Peutz-Jeghers Syndrome: A genetic condition that increases a person's risk of cancer of the stomach, intestines, pancreas, cervix, ovary, and breast.

## 2019-04-04 NOTE — Progress Notes (Signed)
HPI: Postmenopausal Bleeding Patient complains of vaginal bleeding. She has been menopausal for several years. Currently on no HRT but has been on Elequis after PE dx post op from shoulder surgery 07/2018 and has been on this regimen for several months. Bleeding is described as scant staining and has occurred daily for the last few weeks times. Other menopausal symptoms include: none. Workup to date: pelvic ultrasound and attempted EMB aborted due to cervical stenosis.  Menstrual History: OB History    Gravida  3   Para      Term      Preterm      AB      Living  3     SAB      TAB      Ectopic      Multiple      Live Births             No LMP recorded. Patient is postmenopausal.   PMHx: She  has a past medical history of Aneurysm ([place]) (1980), Breast cancer ([place]) (2013), Cancer ([place]), GERD (gastroesophageal reflux disease), Hypertension (1980), Hypothyroidism (1999), Personal history of malignant neoplasm of breast, Pulmonary embolism ([place]), Shingles, and Wears dentures. Also,  has a past surgical history that includes Breast surgery (2013); Cholecystectomy (2004); Breast excisional biopsy (Left, 2013); Breast cyst aspiration (Left, 2005); Colonoscopy; Brain surgery; Esophagogastroduodenoscopy (N/A, 01/31/2018); Colonoscopy with propofol (N/A, 01/31/2018); and Shoulder arthroscopy (Left, 07/25/2018)., family history is not on file.,  reports that she quit smoking about 30 years ago. She has a 30.00 pack-year smoking history. She has never used smokeless tobacco. She reports that she does not drink alcohol or use drugs.  She has a current medication list which includes the following prescription(s): acetaminophen, albuterol, amlodipine, apixaban, citalopram, cyanocobalamin, hydrochlorothiazide, levothyroxine, losartan, omeprazole, potassium chloride, triamterene-hydrochlorothiazide, and trospium chloride. Also, is allergic to bee venom; shellfish allergy; tamoxifen; and  penicillins.  Review of Systems  All other systems reviewed and are negative.   Objective: BP 100/60    Ht 5\' 1"  (1.549 m)    Wt 198 lb (89.8 kg)    BMI 37.41 kg/m   Physical examination Constitutional NAD, Conversant  Skin No rashes, lesions or ulceration.   Extremities: Moves all appropriately.  Normal ROM for age. No lymphadenopathy.  Neuro: Grossly intact  Psych: Oriented to PPT.  Normal mood. Normal affect.   03/30/2019 US Findings:  The uterus is anteverted and measures 5.5 x 4.0 x 3.2cm. Echo texture is homogenous without evidence of focal masses. The Endometrium measures 4.5 mm. Right Ovary measures 2.4 x 1.4 x 1.2 cm. It is normal in appearance. Left Ovary measures 3.8 x 2.5 x 2.0 cm with simple cyst measuring 2.2 x 1.8 x 2.0cm. Survey of the adnexa demonstrates no adnexal masses. There is no free fluid in the cul de sac. Impression: 1. Endometrium is homogeneous measuring 4.72mm 2. Simple left ovarian cyst measuring 2.2cm (known). Recommendations: 1.Clinical correlation with the patient's History and Physical Exam.   Assessment:  Cyst of left ovary  - Plan: CA 125 - long term, likely benign     Postmenopausal bleeding - Options discussed for sampling, EMB vs D&C Difficulty w EMB discussed, benefit of D&C MyRisk testing for Lynch based on FH of multiple GI cancers; the association w uterine cancer discussed; hysterectomy based on pos results (may supercede D&C if pos); so will test now and prior to [facility]  Endometrial thickening on ultrasound - as above  A total of 25 minutes  were spent face-to-face with the patient during this encounter and over half of that time dealt with counseling and coordination of care.  Barnett Applebaum, MD, [facility], [facility] 04/04/2019  11:40 AM

## 2019-04-05 LAB — CA 125: Cancer Antigen (CA) 125: 18.3 U/mL (ref 0.0–38.1)

## 2019-04-06 ENCOUNTER — Telehealth: Payer: Self-pay | Admitting: Obstetrics & Gynecology

## 2019-04-06 NOTE — Telephone Encounter (Signed)
-----   Message from Gae Dry, MD sent at 04/04/2019 11:36 AM EDT ----- Regarding: Surgery Surgery Booking Request Patient Full Name:  Latoya Freeman  MRN: [id number]  DOB: [birth year]  Surgeon: Hoyt Koch, MD  Requested Surgery Date and Time: June 11 Primary Diagnosis AND Code: Post menopausal bleeding Secondary Diagnosis and Code:  Surgical Procedure: Dilation and Curettage L&D Notification: No Admission Status: same day surgery Length of Surgery: 30 min Special Case Needs: no H&P: yes Medical Clearance: Dr Kary Kos, if needed by anes Special Scheduling Instructions: no

## 2019-04-06 NOTE — Telephone Encounter (Signed)
Patient is aware of H&P at [facility] on Monday, 04/23/19 @ 8:20am, Pre-admit Testing and COVID testing to be scheduled, and OR on 05/03/19. Patient is aware she will be asked to quarantine after COVID testing. Patient is aware she may receive calls from the [place] and [facility]. Patient confirmed [facility], and secondary Medicaid. Patient is aware of check-in/mask/no visitors for [facility] appointment.

## 2019-04-11 ENCOUNTER — Telehealth: Payer: Self-pay

## 2019-04-11 NOTE — Telephone Encounter (Signed)
Latoya Freeman is calling to check on a fax about genetic testing she sent here, requesting order clarification/insurance verification. Did it come to your hands? Needs it back to continue.

## 2019-04-12 NOTE — Telephone Encounter (Signed)
What is Herbalist Number? I faxed the insurance info Tuesday,

## 2019-04-23 ENCOUNTER — Encounter: Payer: Self-pay | Admitting: Obstetrics & Gynecology

## 2019-04-23 ENCOUNTER — Ambulatory Visit (INDEPENDENT_AMBULATORY_CARE_PROVIDER_SITE_OTHER): Payer: Medicare HMO | Admitting: Obstetrics & Gynecology

## 2019-04-23 ENCOUNTER — Other Ambulatory Visit: Payer: Self-pay

## 2019-04-23 VITALS — BP 130/80 | Ht 61.0 in | Wt 198.0 lb

## 2019-04-23 DIAGNOSIS — N950 Postmenopausal bleeding: Secondary | ICD-10-CM

## 2019-04-23 NOTE — Progress Notes (Signed)
PRE-OPERATIVE HISTORY AND PHYSICAL EXAM  HPI:  Latoya Freeman is a 78 y.o. G3P0 No LMP recorded. Patient is postmenopausal.; she is being admitted for surgery related to post menopausal bleeding.  Several episodes.  Cervical stenosis noted on exam, unable to perform EMB.  Ultrasound with mild (4.79mm) endometrial thickening.  Pt has been on Eliquis since 07/2018 due to post op PE (shoulder surgery).  Awaiting evaluation to discontinue this medication, and aware of need for perioperative management due to bleeding risks.  PMHx: Past Medical History:  Diagnosis Date  . Aneurysm ([place]) 1980  . Breast cancer ([place]) 2013  . Cancer ([place])   . GERD (gastroesophageal reflux disease)   . Hypertension 1980  . Hypothyroidism 1999  . Personal history of malignant neoplasm of breast    LF Breast   . Pulmonary embolism ([place])   . Shingles   . Wears dentures    full upper and lower   Past Surgical History:  Procedure Laterality Date  . BRAIN SURGERY    . BREAST CYST ASPIRATION Left 2005  . BREAST EXCISIONAL BIOPSY Left 2013   atypical ductal hyperplasia  . BREAST SURGERY  2013   LF Breast Wide EXC   . CHOLECYSTECTOMY  2004  . COLONOSCOPY    . COLONOSCOPY WITH PROPOFOL N/A 01/31/2018   Procedure: COLONOSCOPY WITH PROPOFOL;  Surgeon: Lollie Sails, MD;  Location[facility] ENDOSCOPY;  Service: Endoscopy;  Laterality: N/A;  . ESOPHAGOGASTRODUODENOSCOPY N/A 01/31/2018   Procedure: ESOPHAGOGASTRODUODENOSCOPY (EGD);  Surgeon: Lollie Sails, MD;  Location: [facility] ENDOSCOPY;  Service: Endoscopy;  Laterality: N/A;  . SHOULDER ARTHROSCOPY Left 07/25/2018   Procedure: SHOULDER MINI OPEN ROTATOR CUFF REPAIR  BICEPS TENDOSIS ARTHROSCOPIC DISTAL CLAVICLE EXCISION  SUBACROMIAL DECOMP;  Surgeon: Leim Fabry, MD;  Location: [place];  Service: Orthopedics;  Laterality: Left;  BEACH CHAIR WITH SPYDER SMITH & NEPHEW HEAD COIL ANCHOR FOOTPRINT ANCHOR QFIX ANCHOR   Family History  Problem Relation  Age of Onset  . Breast cancer Neg Hx    Social History   Tobacco Use  . Smoking status: Former Smoker    Packs/day: 1.50    Years: 20.00    Pack years: 30.00    Last attempt to quit: 1990    Years since quitting: 30.4  . Smokeless tobacco: Never Used  Substance Use Topics  . Alcohol use: No  . Drug use: No    Current Outpatient Medications:  .  acetaminophen (TYLENOL) 500 MG tablet, Take 2 tablets (1,000 mg total) by mouth every 8 (eight) hours. (Patient taking differently: Take 1,000 mg by mouth every 4 (four) hours. ), Disp: 90 tablet, Rfl: 2 .  albuterol (VENTOLIN HFA) 108 (90 Base) MCG/ACT inhaler, Inhale into the lungs., Disp: , Rfl:  .  amLODipine (NORVASC) 5 MG tablet, Take 5 mg by mouth daily. , Disp: , Rfl:  .  apixaban (ELIQUIS) 5 MG TABS tablet, Take 5 mg by mouth 2 (two) times daily., Disp: , Rfl:  .  citalopram (CELEXA) 10 MG tablet, Take 10 mg by mouth at bedtime. , Disp: , Rfl:  .  Cyanocobalamin (VITAMIN B-12 PO), Take 1 tablet by mouth daily. , Disp: , Rfl:  .  hydrochlorothiazide (HYDRODIURIL) 25 MG tablet, Take 25 mg by mouth daily. , Disp: , Rfl:  .  levothyroxine (SYNTHROID, LEVOTHROID) 75 MCG tablet, Take 75 mcg by mouth daily., Disp: , Rfl:  .  losartan (COZAAR) 50 MG tablet, Take 50 mg by mouth  daily. , Disp: , Rfl:  .  omeprazole (PRILOSEC) 20 MG capsule, Take 20 mg by mouth daily., Disp: , Rfl:  .  potassium chloride (K-DUR,KLOR-CON) 10 MEQ tablet, Take 20 mEq by mouth daily., Disp: , Rfl: 3 .  triamterene-hydrochlorothiazide (MAXZIDE-25) 37.5-25 MG tablet, Take 1 tablet by mouth daily., Disp: , Rfl:  .  Trospium Chloride 60 MG CP24, TAKE 1 CAPSULE BY MOUTH ONCE DAILY IN THE MORNING BEFORE BREAKFAST, Disp: , Rfl:  Allergies: Bee venom; Shellfish allergy; Tamoxifen; and Penicillins  Review of Systems  All other systems reviewed and are negative.   Objective: BP 130/80   Ht 5\' 1"  (1.549 m)   Wt 198 lb (89.8 kg)   BMI 37.41 kg/m   Filed Weights    04/23/19 0821  Weight: 198 lb (89.8 kg)   Physical Exam Constitutional:      General: She is not in acute distress.    Appearance: She is well-developed.  HENT:     Head: Normocephalic and atraumatic. No laceration.     Right Ear: Hearing normal.     Left Ear: Hearing normal.     Mouth/Throat:     Pharynx: Uvula midline.  Eyes:     Pupils: Pupils are equal, round, and reactive to light.  Neck:     Musculoskeletal: Normal range of motion and neck supple.     Thyroid: No thyromegaly.  Cardiovascular:     Rate and Rhythm: Normal rate and regular rhythm.     Heart sounds: No murmur. No friction rub. No gallop.   Pulmonary:     Effort: Pulmonary effort is normal. No respiratory distress.     Breath sounds: Normal breath sounds. No wheezing.  Chest:     Breasts:        Right: No mass, skin change or tenderness.        Left: No mass, skin change or tenderness.  Abdominal:     General: Bowel sounds are normal. There is no distension.     Palpations: Abdomen is soft.     Tenderness: There is no abdominal tenderness. There is no rebound.  Musculoskeletal: Normal range of motion.  Neurological:     Mental Status: She is alert and oriented to person, place, and time.     Cranial Nerves: No cranial nerve deficit.  Skin:    General: Skin is warm and dry.  Psychiatric:        Judgment: Judgment normal.  Vitals signs reviewed.     Assessment: 1. Postmenopausal bleeding   Plan D&C for diagnosis Eliquis management peri-operatively planned (Dr Kary Kos); risks of bleeding as well as DVT/PE explained. Awaiting MyRisk results for genetic cancer risk factors.  I have had a careful discussion with this patient about all the options available and the risk/benefits of each. I have fully informed this patient that surgery may subject her to a variety of discomforts and risks: She understands that most patients have surgery with little difficulty, but problems can happen ranging from minor to  fatal. These include nausea, vomiting, pain, bleeding, infection, poor healing, hernia, or formation of adhesions. Unexpected reactions may occur from any drug or anesthetic given. Unintended injury may occur to other pelvic or abdominal structures such as Fallopian tubes, ovaries, bladder, ureter (tube from kidney to bladder), or bowel. Nerves going from the pelvis to the legs may be injured. Any such injury may require immediate or later additional surgery to correct the problem. Excessive blood loss requiring transfusion is very  unlikely but possible. Dangerous blood clots may form in the legs or lungs. Physical and sexual activity will be restricted in varying degrees for an indeterminate period of time but most often 2-6 weeks.  Finally, she understands that it is impossible to list every possible undesirable effect and that the condition for which surgery is done is not always cured or significantly improved, and in rare cases may be even worse.Ample time was given to answer all questions.  Barnett Applebaum, MD, [facility], [facility] 04/23/2019  8:23 AM

## 2019-04-23 NOTE — Patient Instructions (Signed)
Dilation and Curettage or Vacuum Curettage  Dilation and curettage (D&C) and vacuum curettage are minor procedures. A D&C involves stretching (dilation) the cervix and scraping (curettage) the inside lining of the uterus (endometrium). During a D&C, tissue is gently scraped from the endometrium, starting from the top portion of the uterus down to the lowest part of the uterus (cervix). During a vacuum curettage, the lining and tissue in the uterus are removed with the use of gentle suction. Curettage may be performed to either diagnose or treat a problem. As a diagnostic procedure, curettage is performed to examine tissues from the uterus. A diagnostic curettage may be done if you have:  Irregular bleeding in the uterus.  Bleeding with the development of clots.  Spotting between menstrual periods.  Prolonged menstrual periods or other abnormal bleeding.  Bleeding after menopause.  No menstrual period (amenorrhea).  A change in size and shape of the uterus.  Abnormal endometrial cells discovered during a Pap test. As a treatment procedure, curettage may be performed for the following reasons:  Removal of an IUD (intrauterine device).  Removal of retained placenta after giving birth.  Abortion.  Miscarriage.  Removal of endometrial polyps.  Removal of uncommon types of noncancerous lumps (fibroids). Tell a health care provider about:  Any allergies you have, including allergies to prescribed medicine or latex.  All medicines you are taking, including vitamins, herbs, eye drops, creams, and over-the-counter medicines. This is especially important if you take any blood-thinning medicine. Bring a list of all of your medicines to your appointment.  Any problems you or family members have had with anesthetic medicines.  Any blood disorders you have.  Any surgeries you have had.  Your medical history and any medical conditions you have.  Whether you are pregnant or may be  pregnant.  Recent vaginal infections you have had.  Recent menstrual periods, bleeding problems you have had, and what form of birth control (contraception) you use. What are the risks? Generally, this is a safe procedure. However, problems may occur, including:  Infection.  Heavy vaginal bleeding.  Allergic reactions to medicines.  Damage to the cervix or other structures or organs.  Development of scar tissue (adhesions) inside the uterus, which can cause abnormal amounts of menstrual bleeding. This may make it harder to get pregnant in the future.  A hole (perforation) or puncture in the uterine wall. This is rare. What happens before the procedure? Staying hydrated Follow instructions from your health care provider about hydration, which may include:  Up to 2 hours before the procedure - you may continue to drink clear liquids, such as water, clear fruit juice, black coffee, and plain tea. Eating and drinking restrictions Follow instructions from your health care provider about eating and drinking, which may include:  8 hours before the procedure - stop eating heavy meals or foods such as meat, fried foods, or fatty foods.  6 hours before the procedure - stop eating light meals or foods, such as toast or cereal.  6 hours before the procedure - stop drinking milk or drinks that contain milk.  2 hours before the procedure - stop drinking clear liquids. If your health care provider told you to take your medicine(s) on the day of your procedure, take them with only a sip of water. Medicines  Ask your health care provider about: ? Changing or stopping your regular medicines. This is especially important if you are taking diabetes medicines or blood thinners. ? Taking medicines such as aspirin  and ibuprofen. These medicines can thin your blood. Do not take these medicines before your procedure if your health care provider instructs you not to.  You may be given antibiotic  medicine to help prevent infection. General instructions  For 24 hours before your procedure, do not: ? Douche. ? Use tampons. ? Use medicines, creams, or suppositories in the vagina. ? Have sexual intercourse.  You may be given a pregnancy test on the day of the procedure.  Plan to have someone take you home from the hospital or clinic.  You may have a blood or urine sample taken.  If you will be going home right after the procedure, plan to have someone with you for 24 hours. What happens during the procedure?  To reduce your risk of infection: ? Your health care team will wash or sanitize their hands. ? Your skin will be washed with soap.  An IV tube will be inserted into one of your veins.  You will be given one of the following: ? A medicine that numbs the area in and around the cervix (local anesthetic). ? A medicine to make you fall asleep (general anesthetic).  You will lie down on your back, with your feet in foot rests (stirrups).  The size and position of your uterus will be checked.  A lubricated instrument (speculum or Sims retractor) will be inserted into the back side of your vagina. The speculum will be used to hold apart the walls of your vagina so your health care provider can see your cervix.  A tool (tenaculum) will be attached to the lip of the cervix to stabilize it.  Your cervix will be softened and dilated. This may be done by: ? Taking a medicine. ? Having tapered dilators or thin rods (laminaria) or gradual widening instruments (tapered dilators) inserted into your cervix.  A small, sharp, curved instrument (curette) will be used to scrape a small amount of tissue or cells from the endometrium or cervical canal. In some cases, gentle suction is applied with the curette. The curette will then be removed. The cells will be taken to a lab for testing. The procedure may vary among health care providers and hospitals. What happens after the procedure?   You may have mild cramping, backache, pain, and light bleeding or spotting. You may pass small blood clots from your vagina.  You may have to wear compression stockings. These stockings help to prevent blood clots and reduce swelling in your legs.  Your blood pressure, heart rate, breathing rate, and blood oxygen level will be monitored until the medicines you were given have worn off. Summary  Dilation and curettage (D&C) involves stretching (dilation) the cervix and scraping (curettage) the inside lining of the uterus (endometrium).  After the procedure, you may have mild cramping, backache, pain, and light bleeding or spotting. You may pass small blood clots from your vagina.  Plan to have someone take you home from the hospital or clinic. This information is not intended to replace advice given to you by your health care provider. Make sure you discuss any questions you have with your health care provider. Document Released: 11/08/2005 Document Revised: 07/25/2016 Document Reviewed: 07/25/2016 Elsevier Interactive Patient Education  2019 [employer].

## 2019-04-23 NOTE — H&P (View-Only) (Signed)
PRE-OPERATIVE HISTORY AND PHYSICAL EXAM  HPI:  Latoya Freeman is a 78 y.o. G3P0 No LMP recorded. Patient is postmenopausal.; she is being admitted for surgery related to post menopausal bleeding.  Several episodes.  Cervical stenosis noted on exam, unable to perform EMB.  Ultrasound with mild (4.82mm) endometrial thickening.  Pt has been on Eliquis since 07/2018 due to post op PE (shoulder surgery).  Awaiting evaluation to discontinue this medication, and aware of need for perioperative management due to bleeding risks.  PMHx: Past Medical History:  Diagnosis Date  . Aneurysm ([place]) 1980  . Breast cancer ([place]) 2013  . Cancer ([place])   . GERD (gastroesophageal reflux disease)   . Hypertension 1980  . Hypothyroidism 1999  . Personal history of malignant neoplasm of breast    LF Breast   . Pulmonary embolism ([place])   . Shingles   . Wears dentures    full upper and lower   Past Surgical History:  Procedure Laterality Date  . BRAIN SURGERY    . BREAST CYST ASPIRATION Left 2005  . BREAST EXCISIONAL BIOPSY Left 2013   atypical ductal hyperplasia  . BREAST SURGERY  2013   LF Breast Wide EXC   . CHOLECYSTECTOMY  2004  . COLONOSCOPY    . COLONOSCOPY WITH PROPOFOL N/A 01/31/2018   Procedure: COLONOSCOPY WITH PROPOFOL;  Surgeon: Lollie Sails, MD;  Location: [facility] ENDOSCOPY;  Service: Endoscopy;  Laterality: N/A;  . ESOPHAGOGASTRODUODENOSCOPY N/A 01/31/2018   Procedure: ESOPHAGOGASTRODUODENOSCOPY (EGD);  Surgeon: Lollie Sails, MD;  Location: [facility] ENDOSCOPY;  Service: Endoscopy;  Laterality: N/A;  . SHOULDER ARTHROSCOPY Left 07/25/2018   Procedure: SHOULDER MINI OPEN ROTATOR CUFF REPAIR  BICEPS TENDOSIS ARTHROSCOPIC DISTAL CLAVICLE EXCISION  SUBACROMIAL DECOMP;  Surgeon: Leim Fabry, MD;  Location: [place];  Service: Orthopedics;  Laterality: Left;  BEACH CHAIR WITH [employer] & NEPHEW HEAD COIL ANCHOR FOOTPRINT ANCHOR QFIX ANCHOR   Family History  Problem Relation  Age of Onset  . Breast cancer Neg Hx    Social History   Tobacco Use  . Smoking status: Former Smoker    Packs/day: 1.50    Years: 20.00    Pack years: 30.00    Last attempt to quit: 1990    Years since quitting: 30.4  . Smokeless tobacco: Never Used  Substance Use Topics  . Alcohol use: No  . Drug use: No    Current Outpatient Medications:  .  acetaminophen (TYLENOL) 500 MG tablet, Take 2 tablets (1,000 mg total) by mouth every 8 (eight) hours. (Patient taking differently: Take 1,000 mg by mouth every 4 (four) hours. ), Disp: 90 tablet, Rfl: 2 .  albuterol (VENTOLIN HFA) 108 (90 Base) MCG/ACT inhaler, Inhale into the lungs., Disp: , Rfl:  .  amLODipine (NORVASC) 5 MG tablet, Take 5 mg by mouth daily. , Disp: , Rfl:  .  apixaban (ELIQUIS) 5 MG TABS tablet, Take 5 mg by mouth 2 (two) times daily., Disp: , Rfl:  .  citalopram (CELEXA) 10 MG tablet, Take 10 mg by mouth at bedtime. , Disp: , Rfl:  .  Cyanocobalamin (VITAMIN B-12 PO), Take 1 tablet by mouth daily. , Disp: , Rfl:  .  hydrochlorothiazide (HYDRODIURIL) 25 MG tablet, Take 25 mg by mouth daily. , Disp: , Rfl:  .  levothyroxine (SYNTHROID, LEVOTHROID) 75 MCG tablet, Take 75 mcg by mouth daily., Disp: , Rfl:  .  losartan (COZAAR) 50 MG tablet, Take 50 mg by mouth  daily. , Disp: , Rfl:  .  omeprazole (PRILOSEC) 20 MG capsule, Take 20 mg by mouth daily., Disp: , Rfl:  .  potassium chloride (K-DUR,KLOR-CON) 10 MEQ tablet, Take 20 mEq by mouth daily., Disp: , Rfl: 3 .  triamterene-hydrochlorothiazide (MAXZIDE-25) 37.5-25 MG tablet, Take 1 tablet by mouth daily., Disp: , Rfl:  .  Trospium Chloride 60 MG CP24, TAKE 1 CAPSULE BY MOUTH ONCE DAILY IN THE MORNING BEFORE BREAKFAST, Disp: , Rfl:  Allergies: Bee venom; Shellfish allergy; Tamoxifen; and Penicillins  Review of Systems  All other systems reviewed and are negative.   Objective: BP 130/80   Ht 5\' 1"  (1.549 m)   Wt 198 lb (89.8 kg)   BMI 37.41 kg/m   Filed Weights    04/23/19 0821  Weight: 198 lb (89.8 kg)   Physical Exam Constitutional:      General: She is not in acute distress.    Appearance: She is well-developed.  HENT:     Head: Normocephalic and atraumatic. No laceration.     Right Ear: Hearing normal.     Left Ear: Hearing normal.     Mouth/Throat:     Pharynx: Uvula midline.  Eyes:     Pupils: Pupils are equal, round, and reactive to light.  Neck:     Musculoskeletal: Normal range of motion and neck supple.     Thyroid: No thyromegaly.  Cardiovascular:     Rate and Rhythm: Normal rate and regular rhythm.     Heart sounds: No murmur. No friction rub. No gallop.   Pulmonary:     Effort: Pulmonary effort is normal. No respiratory distress.     Breath sounds: Normal breath sounds. No wheezing.  Chest:     Breasts:        Right: No mass, skin change or tenderness.        Left: No mass, skin change or tenderness.  Abdominal:     General: Bowel sounds are normal. There is no distension.     Palpations: Abdomen is soft.     Tenderness: There is no abdominal tenderness. There is no rebound.  Musculoskeletal: Normal range of motion.  Neurological:     Mental Status: She is alert and oriented to person, place, and time.     Cranial Nerves: No cranial nerve deficit.  Skin:    General: Skin is warm and dry.  Psychiatric:        Judgment: Judgment normal.  Vitals signs reviewed.     Assessment: 1. Postmenopausal bleeding   Plan D&C for diagnosis Eliquis management peri-operatively planned (Dr Kary Kos); risks of bleeding as well as DVT/PE explained. Awaiting MyRisk results for genetic cancer risk factors.  I have had a careful discussion with this patient about all the options available and the risk/benefits of each. I have fully informed this patient that surgery may subject her to a variety of discomforts and risks: She understands that most patients have surgery with little difficulty, but problems can happen ranging from minor to  fatal. These include nausea, vomiting, pain, bleeding, infection, poor healing, hernia, or formation of adhesions. Unexpected reactions may occur from any drug or anesthetic given. Unintended injury may occur to other pelvic or abdominal structures such as Fallopian tubes, ovaries, bladder, ureter (tube from kidney to bladder), or bowel. Nerves going from the pelvis to the legs may be injured. Any such injury may require immediate or later additional surgery to correct the problem. Excessive blood loss requiring transfusion is very  unlikely but possible. Dangerous blood clots may form in the legs or lungs. Physical and sexual activity will be restricted in varying degrees for an indeterminate period of time but most often 2-6 weeks.  Finally, she understands that it is impossible to list every possible undesirable effect and that the condition for which surgery is done is not always cured or significantly improved, and in rare cases may be even worse.Ample time was given to answer all questions.  Barnett Applebaum, MD, [facility], [facility] 04/23/2019  8:23 AM

## 2019-04-27 ENCOUNTER — Telehealth: Payer: Self-pay

## 2019-04-27 NOTE — Telephone Encounter (Signed)
Sheila aware.

## 2019-04-27 NOTE — Telephone Encounter (Signed)
Pt daughter, Alinda Dooms, calling.  Pt is scheduled for surg 6/11.  PH wanted to know how to manage her Eloquis prior to surgery.  Pt's pcp is not comfortable making that decision so he sent pt to hematologist.  Appt c hematologist is Tues am @ 10;45AM.  Is that cutting it too close for [facility] prior to surg (48hrs) or do we need to get an earlier hematology appt or delay the surgery?  What needs to happen.  Please call Alinda Dooms at [phone]

## 2019-04-27 NOTE — Telephone Encounter (Signed)
Usually 1-2 days is fine, so keep appt Tuesday.  If needs to be much longer will adjust surgery date then. Let her know

## 2019-04-30 ENCOUNTER — Encounter
Admission: RE | Admit: 2019-04-30 | Discharge: 2019-04-30 | Disposition: A | Discharge status: Home / Self Care | Payer: Medicare HMO | Source: Ambulatory Visit | Attending: Obstetrics & Gynecology | Admitting: Obstetrics & Gynecology

## 2019-04-30 ENCOUNTER — Telehealth: Payer: Self-pay

## 2019-04-30 ENCOUNTER — Other Ambulatory Visit: Payer: Self-pay

## 2019-04-30 DIAGNOSIS — Z888 Allergy status to other drugs, medicaments and biological substances status: Secondary | ICD-10-CM | POA: Diagnosis not present

## 2019-04-30 DIAGNOSIS — Z880 Allergy status to penicillin: Secondary | ICD-10-CM | POA: Diagnosis not present

## 2019-04-30 DIAGNOSIS — Z86711 Personal history of pulmonary embolism: Secondary | ICD-10-CM | POA: Diagnosis not present

## 2019-04-30 DIAGNOSIS — Z87891 Personal history of nicotine dependence: Secondary | ICD-10-CM | POA: Diagnosis not present

## 2019-04-30 DIAGNOSIS — I10 Essential (primary) hypertension: Secondary | ICD-10-CM | POA: Diagnosis not present

## 2019-04-30 DIAGNOSIS — N950 Postmenopausal bleeding: Secondary | ICD-10-CM | POA: Diagnosis not present

## 2019-04-30 DIAGNOSIS — E039 Hypothyroidism, unspecified: Secondary | ICD-10-CM | POA: Diagnosis not present

## 2019-04-30 DIAGNOSIS — Z7901 Long term (current) use of anticoagulants: Secondary | ICD-10-CM | POA: Diagnosis not present

## 2019-04-30 DIAGNOSIS — Z853 Personal history of malignant neoplasm of breast: Secondary | ICD-10-CM | POA: Diagnosis not present

## 2019-04-30 DIAGNOSIS — K219 Gastro-esophageal reflux disease without esophagitis: Secondary | ICD-10-CM | POA: Diagnosis not present

## 2019-04-30 DIAGNOSIS — Z01812 Encounter for preprocedural laboratory examination: Secondary | ICD-10-CM | POA: Insufficient documentation

## 2019-04-30 DIAGNOSIS — R9389 Abnormal findings on diagnostic imaging of other specified body structures: Secondary | ICD-10-CM | POA: Diagnosis not present

## 2019-04-30 DIAGNOSIS — Z1159 Encounter for screening for other viral diseases: Secondary | ICD-10-CM | POA: Insufficient documentation

## 2019-04-30 DIAGNOSIS — Z79899 Other long term (current) drug therapy: Secondary | ICD-10-CM | POA: Diagnosis not present

## 2019-04-30 DIAGNOSIS — Z79890 Hormone replacement therapy (postmenopausal): Secondary | ICD-10-CM | POA: Diagnosis not present

## 2019-04-30 HISTORY — DX: Pneumonia, unspecified organism: J18.9

## 2019-04-30 LAB — BASIC METABOLIC PANEL
Anion gap: 8 (ref 5–15)
BUN: 13 mg/dL (ref 8–23)
CO2: 27 mmol/L (ref 22–32)
Calcium: 10.1 mg/dL (ref 8.9–10.3)
Chloride: 104 mmol/L (ref 98–111)
Creatinine, Ser: 1.14 mg/dL — ABNORMAL HIGH (ref 0.44–1.00)
GFR calc Af Amer: 54 mL/min — ABNORMAL LOW (ref >60)
GFR calc non Af Amer: 46 mL/min — ABNORMAL LOW (ref >60)
Glucose, Bld: 86 mg/dL (ref 70–99)
Potassium: 3.6 mmol/L (ref 3.5–5.1)
Sodium: 139 mmol/L (ref 135–145)

## 2019-04-30 LAB — CBC
HCT: 37.6 % (ref 36.0–46.0)
Hemoglobin: 12.6 g/dL (ref 12.0–15.0)
MCH: 29.1 pg (ref 26.0–34.0)
MCHC: 33.5 g/dL (ref 30.0–36.0)
MCV: 86.8 fL (ref 80.0–100.0)
Platelets: 254 10*3/uL (ref 150–400)
RBC: 4.33 MIL/uL (ref 3.87–5.11)
RDW: 13.5 % (ref 11.5–15.5)
WBC: 6.3 10*3/uL (ref 4.0–10.5)
nRBC: 0 % (ref 0.0–0.2)

## 2019-04-30 LAB — APTT: aPTT: 26 seconds (ref 24–36)

## 2019-04-30 LAB — PROTIME-INR
INR: 1 (ref 0.8–1.2)
Prothrombin Time: 13.4 seconds (ref 11.4–15.2)

## 2019-04-30 NOTE — Telephone Encounter (Signed)
Notification of canceled test from [facility] on patient specimen. I called and spoke to [place] from [place].  She states Colaris testing should have been ordered, and pt has [employer] advantage PPO, pt would have to have personal history of Colon cancer for insurance to cover.  Let me know if I need to do anything else on this end.   Message sent to [facility] and Dr. Kenton Kingfisher

## 2019-04-30 NOTE — Patient Instructions (Signed)
Your procedure is scheduled on: Thursday 05/03/19 Report to [place]. To find out your arrival time please call [phone] between 1PM - 3PM on Wednesday 05/02/19.  Remember: Instructions that are not followed completely may result in serious medical risk, up to and including death, or upon the discretion of your surgeon and anesthesiologist your surgery may need to be rescheduled.     _X__ 1. Do not eat food after midnight the night before your procedure.                 No gum chewing or hard candies. You may drink clear liquids up to 2 hours                 before you are scheduled to arrive for your surgery- DO not drink clear                 liquids within 2 hours of the start of your surgery.                 Clear Liquids include:  water, apple juice without pulp, clear carbohydrate                 drink such as Clearfast or Gatorade, Black Coffee or Tea (Do not add                 anything to coffee or tea).  __X__2.  On the morning of surgery brush your teeth with toothpaste and water, you                 may rinse your mouth with mouthwash if you wish.  Do not swallow any              toothpaste of mouthwash.     _X__ 3.  No Alcohol for 24 hours before or after surgery.   _X__ 4.  Do Not Smoke or use e-cigarettes For 24 Hours Prior to Your Surgery.                 Do not use any chewable tobacco products for at least 6 hours prior to                 surgery.  ____  5.  Bring all medications with you on the day of surgery if instructed.   __X__  6.  Notify your doctor if there is any change in your medical condition      (cold, fever, infections).     Do not wear jewelry, make-up, hairpins, clips or nail polish. Do not wear lotions, powders, or perfumes.  Do not shave 48 hours prior to surgery. Men may shave face and neck. Do not bring valuables to the hospital.    [facility] is not responsible for any belongings or  valuables.  Contacts, dentures/partials or body piercings may not be worn into surgery. Bring a case for your contacts, glasses or hearing aids, a denture cup will be supplied. Leave your suitcase in the car. After surgery it may be brought to your room. For patients admitted to the hospital, discharge time is determined by your treatment team.   Patients discharged the day of surgery will not be allowed to drive home.   Please read over the following fact sheets that you were given:   MRSA Information  __X__ Take these medicines the morning of surgery with A SIP OF WATER:  1. amLODipine (NORVASC)  2. levothyroxine (SYNTHROID, LEVOTHROID)   3. omeprazole (PRILOSEC)  4.  5.  6.  ____ Fleet Enema (as directed)   __X__ Use CHG Soap/SAGE wipes as directed  ____ Use inhalers on the day of surgery  ____ Stop metformin/Janumet/Farxiga 2 days prior to surgery    ____ Take 1/2 of usual insulin dose the night before surgery. No insulin the morning          of surgery.   __X__ Stop Blood Thinners Coumadin/Plavix/Xarelto/Pleta/Pradaxa/Eliquis/Effient/Aspirin  on   Or contact your Surgeon, Cardiologist or Medical Doctor regarding  ability to stop your blood thinners  __X__ Stop Anti-inflammatories 7 days before surgery such as Advil, Ibuprofen, Motrin,  BC or Goodies Powder, Naprosyn, Naproxen, Aleve, Aspirin    __X__ Stop all herbal supplements, fish oil or vitamin E until after surgery.    ____ Bring C-Pap to the hospital.

## 2019-05-01 ENCOUNTER — Other Ambulatory Visit: Payer: Self-pay

## 2019-05-01 ENCOUNTER — Inpatient Hospital Stay: Payer: Medicare HMO | Attending: Oncology | Admitting: Oncology

## 2019-05-01 ENCOUNTER — Encounter: Payer: Self-pay | Admitting: Oncology

## 2019-05-01 VITALS — BP 122/75 | HR 61 | Temp 98.6°F | Resp 20

## 2019-05-01 DIAGNOSIS — R9389 Abnormal findings on diagnostic imaging of other specified body structures: Secondary | ICD-10-CM | POA: Diagnosis not present

## 2019-05-01 DIAGNOSIS — I2782 Chronic pulmonary embolism: Secondary | ICD-10-CM

## 2019-05-01 DIAGNOSIS — Z7901 Long term (current) use of anticoagulants: Secondary | ICD-10-CM

## 2019-05-01 DIAGNOSIS — N950 Postmenopausal bleeding: Secondary | ICD-10-CM

## 2019-05-01 DIAGNOSIS — Z87891 Personal history of nicotine dependence: Secondary | ICD-10-CM | POA: Diagnosis not present

## 2019-05-01 DIAGNOSIS — Z803 Family history of malignant neoplasm of breast: Secondary | ICD-10-CM | POA: Diagnosis not present

## 2019-05-01 DIAGNOSIS — Z79899 Other long term (current) drug therapy: Secondary | ICD-10-CM

## 2019-05-01 DIAGNOSIS — Z880 Allergy status to penicillin: Secondary | ICD-10-CM | POA: Diagnosis not present

## 2019-05-01 DIAGNOSIS — E079 Disorder of thyroid, unspecified: Secondary | ICD-10-CM

## 2019-05-01 DIAGNOSIS — Z86711 Personal history of pulmonary embolism: Secondary | ICD-10-CM

## 2019-05-01 DIAGNOSIS — N83202 Unspecified ovarian cyst, left side: Secondary | ICD-10-CM | POA: Diagnosis not present

## 2019-05-01 LAB — NOVEL CORONAVIRUS, NAA (HOSP ORDER, SEND-OUT TO REF LAB; TAT 18-24 HRS): SARS-CoV-2, NAA: NOT DETECTED

## 2019-05-01 NOTE — Progress Notes (Signed)
Hematology/Oncology Consult note [facility] Telephone:([phone] [phone]  Patient Care Team: [name], MD as PCP - General (Family Medicine)   Name of the patient: Latoya Freeman  [id number]  May 14, 1941    Reason for referral- h/o PE   Referring physician- Dr. Kary Kos  Date of visit: 05/01/19   History of presenting illness- Patient is a 78 year old female with a past medical history significant for hypertension and hypothyroidism among other medical problems.  She has recently been experiencing some postmenopausal bleeding and is scheduled to undergo D&C in 2 days time.  She has been referred to [place] for Her history of PE.  Patient was seen in the ER in December 2019 for 3 to 4 days symptoms of shortness of breath.  CT showed acute segmental pulmonary emboli in the left upper lobe lingula and left lower lobe with no evidence of right heart strain.  Patient also underwent an ultrasound of her left lower extremity given symptoms of pain and swelling and was found to have no evidence of DVT within the left lower extremity this was a week after her PE.  Patient currently takes Eliquis as an outpatient for her PE and has been holding her medication since yesterday.  Patient has no prior history of DVT or PE.  No significant family history of thromboembolism.  Patient has had prior uneventful pregnancies.  ECOG PS- 1  Pain scale- 0   Review of systems- Review of Systems  Constitutional: Negative for chills, fever, malaise/fatigue and weight loss.  HENT: Negative for congestion, ear discharge and nosebleeds.   Eyes: Negative for blurred vision.  Respiratory: Negative for cough, hemoptysis, sputum production, shortness of breath and wheezing.   Cardiovascular: Negative for chest pain, palpitations, orthopnea and claudication.  Gastrointestinal: Negative for abdominal pain, blood in stool, constipation, diarrhea, heartburn, melena, nausea and vomiting.   Genitourinary: Negative for dysuria, flank pain, frequency, hematuria and urgency.       Vaginal bleeding  Musculoskeletal: Negative for back pain, joint pain and myalgias.  Skin: Negative for rash.  Neurological: Negative for dizziness, tingling, focal weakness, seizures, weakness and headaches.  Endo/Heme/Allergies: Does not bruise/bleed easily.  Psychiatric/Behavioral: Negative for depression and suicidal ideas. The patient does not have insomnia.     Allergies  Allergen Reactions  . Bee Venom Swelling  . Shellfish Allergy Swelling  . Tamoxifen Hives  . Penicillins Hives, Rash and Other (See Comments)    Has patient had a PCN reaction causing immediate rash, facial/tongue/throat swelling, SOB or lightheadedness with hypotension: Unknown Has patient had a PCN reaction causing severe rash involving mucus membranes or skin necrosis: Unknown Has patient had a PCN reaction that required hospitalization: Unknown Has patient had a PCN reaction occurring within the last 10 years: No If all of the above answers are "NO", then may proceed with Cephalosporin use.     Patient Active Problem List   Diagnosis Date Noted  . Cyst of left ovary 04/04/2019  . Postmenopausal bleeding 04/04/2019  . Endometrial thickening on ultrasound 04/04/2019  . Pulmonary embolism ([place])   . GERD (gastroesophageal reflux disease) 08/07/2018  . Hypertension 08/07/2018  . Shingles 08/07/2018  . Thyroid disease 08/07/2018  . HCAP (healthcare-associated pneumonia) 07/27/2018     Past Medical History:  Diagnosis Date  . Aneurysm ([place]) 1980  . Cancer ([place])   . GERD (gastroesophageal reflux disease)   . Hypertension 1980  . Hypothyroidism 1999  . Pneumonia   . Pulmonary embolism ([place])   .  Shingles   . Wears dentures    full upper and lower     Past Surgical History:  Procedure Laterality Date  . BRAIN SURGERY     aneurysm  . BREAST CYST ASPIRATION Left 2005  . BREAST EXCISIONAL BIOPSY Left 2013    atypical ductal hyperplasia  . BREAST SURGERY  2013   LF Breast Wide EXC   . CHOLECYSTECTOMY  2004  . COLONOSCOPY    . COLONOSCOPY WITH PROPOFOL N/A 01/31/2018   Procedure: COLONOSCOPY WITH PROPOFOL;  Surgeon: Lollie Sails, MD;  Location: [facility] ENDOSCOPY;  Service: Endoscopy;  Laterality: N/A;  . ESOPHAGOGASTRODUODENOSCOPY N/A 01/31/2018   Procedure: ESOPHAGOGASTRODUODENOSCOPY (EGD);  Surgeon: Lollie Sails, MD;  Location: [facility] ENDOSCOPY;  Service: Endoscopy;  Laterality: N/A;  . SHOULDER ARTHROSCOPY Left 07/25/2018   Procedure: SHOULDER MINI OPEN ROTATOR CUFF REPAIR  BICEPS TENDOSIS ARTHROSCOPIC DISTAL CLAVICLE EXCISION  SUBACROMIAL DECOMP;  Surgeon: Leim Fabry, MD;  Location: [place];  Service: Orthopedics;  Laterality: Left;  [place] & NEPHEW HEAD COIL ANCHOR FOOTPRINT ANCHOR QFIX ANCHOR    Social History   Socioeconomic History  . Marital status: Divorced    Spouse name: Not on file  . Number of children: Not on file  . Years of education: Not on file  . Highest education level: Not on file  Occupational History  . Not on file  Social Needs  . Financial resource strain: Not on file  . Food insecurity:    Worry: Not on file    Inability: Not on file  . Transportation needs:    Medical: Not on file    Non-medical: Not on file  Tobacco Use  . Smoking status: Former Smoker    Packs/day: 1.50    Years: 20.00    Pack years: 30.00    Last attempt to quit: 1990    Years since quitting: 30.4  . Smokeless tobacco: Never Used  Substance and Sexual Activity  . Alcohol use: No  . Drug use: No  . Sexual activity: Not on file  Lifestyle  . Physical activity:    Days per week: Not on file    Minutes per session: Not on file  . Stress: Not on file  Relationships  . Social connections:    Talks on phone: Not on file    Gets together: Not on file    Attends religious service: Not on file    Active member of club or organization: Not  on file    Attends meetings of clubs or organizations: Not on file    Relationship status: Not on file  . Intimate partner violence:    Fear of current or ex partner: Not on file    Emotionally abused: Not on file    Physically abused: Not on file    Forced sexual activity: Not on file  Other Topics Concern  . Not on file  Social History Narrative  . Not on file     Family History  Problem Relation Age of Onset  . Breast cancer Neg Hx      Current Outpatient Medications:  .  acetaminophen (TYLENOL) 500 MG tablet, Take 2 tablets (1,000 mg total) by mouth every 8 (eight) hours. (Patient taking differently: Take 1,000 mg by mouth every 4 (four) hours. ), Disp: 90 tablet, Rfl: 2 .  amLODipine (NORVASC) 5 MG tablet, Take 5 mg by mouth daily. , Disp: , Rfl:  .  apixaban (ELIQUIS) 5 MG TABS  tablet, Take 5 mg by mouth 2 (two) times daily., Disp: , Rfl:  .  citalopram (CELEXA) 20 MG tablet, Take 20 mg by mouth daily., Disp: , Rfl:  .  Cyanocobalamin (VITAMIN B-12 PO), Take 1 tablet by mouth daily. , Disp: , Rfl:  .  hydrochlorothiazide (HYDRODIURIL) 25 MG tablet, Take 25 mg by mouth daily. , Disp: , Rfl:  .  levothyroxine (SYNTHROID, LEVOTHROID) 75 MCG tablet, Take 75 mcg by mouth daily., Disp: , Rfl:  .  losartan (COZAAR) 50 MG tablet, Take 50 mg by mouth daily. , Disp: , Rfl:  .  omeprazole (PRILOSEC) 20 MG capsule, Take 20 mg by mouth daily., Disp: , Rfl:  .  potassium chloride (K-DUR,KLOR-CON) 10 MEQ tablet, Take 20 mEq by mouth daily., Disp: , Rfl: 3   Physical exam:  Vitals:   05/01/19 1100  BP: 122/75  Pulse: 61  Resp: 20  Temp: 98.6 F (37 C)  TempSrc: Tympanic   Physical Exam Constitutional:      General: She is not in acute distress.    Appearance: She is obese.  HENT:     Head: Normocephalic and atraumatic.  Eyes:     Pupils: Pupils are equal, round, and reactive to light.  Neck:     Musculoskeletal: Normal range of motion.  Cardiovascular:     Rate and Rhythm:  Normal rate and regular rhythm.     Heart sounds: Normal heart sounds.  Pulmonary:     Effort: Pulmonary effort is normal.     Breath sounds: Normal breath sounds.  Abdominal:     General: Bowel sounds are normal.     Palpations: Abdomen is soft.  Skin:    General: Skin is warm and dry.  Neurological:     Mental Status: She is alert and oriented to person, place, and time.        CMP Latest Ref Rng & Units 04/30/2019  Glucose 70 - 99 mg/dL 86  BUN 8 - 23 mg/dL 13  Creatinine 0.44 - 1.00 mg/dL 1.14(H)  Sodium 135 - 145 mmol/L 139  Potassium 3.5 - 5.1 mmol/L 3.6  Chloride 98 - 111 mmol/L 104  CO2 22 - 32 mmol/L 27  Calcium 8.9 - 10.3 mg/dL 10.1  Total Protein 6.5 - 8.1 g/dL -  Total Bilirubin 0.3 - 1.2 mg/dL -  Alkaline Phos 38 - 126 U/L -  AST 15 - 41 U/L -  ALT 0 - 44 U/L -   CBC Latest Ref Rng & Units 04/30/2019  WBC 4.0 - 10.5 K/uL 6.3  Hemoglobin 12.0 - 15.0 g/dL 12.6  Hematocrit 36.0 - 46.0 % 37.6  Platelets 150 - 400 K/uL 254     Assessment and plan- Patient is a 78 y.o. female with prior history of pulmonary embolism unprovoked in December 2019 referred for anticoagulation recommendations prior to [facility]  Patient has no prior history of PE but did seem to have an unprovoked episode of pulmonary embolism in December 2019.  She was started on Eliquis and has been on it since then.  As far as duration of anticoagulation is concerned I would recommend that she should remain on indefinite anticoagulation given the unprovoked episode.  Also hypercoagulable work-up would not change her management and I would not recommend that as such  With regards to her upcoming D&C procedure- I have personally spoken to Dr. Kenton Kingfisher. Patient is already off eliquis since 1 day. She will remain off eliquis until morning of procedure and  will restart taking it that night of procedure. No bridging needed. D&C is a relatively low risk procedure in terms of bleeding risk  Depending on findings at  pathology- of she is in need of hysterectomy- anticoagulation recommends may change if she has open or laparoscopic procedure. She will need post operative bridging before eliquis can be restarted.  No heme onc follow up needed at this time   Thank you for this kind referral and the opportunity to participate in the care of this  Patient   Visit Diagnosis 1. Other chronic pulmonary embolism without acute cor pulmonale (HCC)     Dr. Randa Evens, MD, MPH [facility] at [facility] [phone] 05/01/2019 12:36 PM

## 2019-05-02 ENCOUNTER — Encounter: Payer: Self-pay | Admitting: Anesthesiology

## 2019-05-03 ENCOUNTER — Ambulatory Visit: Payer: Medicare HMO | Admitting: Anesthesiology

## 2019-05-03 ENCOUNTER — Ambulatory Visit
Admission: RE | Admit: 2019-05-03 | Discharge: 2019-05-03 | Disposition: A | Discharge status: Home / Self Care | Payer: Medicare HMO | Attending: Obstetrics & Gynecology | Admitting: Obstetrics & Gynecology

## 2019-05-03 ENCOUNTER — Other Ambulatory Visit: Payer: Self-pay

## 2019-05-03 ENCOUNTER — Encounter
Admission: RE | Disposition: A | Discharge status: Home / Self Care | Payer: Self-pay | Source: Home / Self Care | Attending: Obstetrics & Gynecology

## 2019-05-03 ENCOUNTER — Encounter: Payer: Self-pay | Admitting: *Deleted

## 2019-05-03 DIAGNOSIS — R9389 Abnormal findings on diagnostic imaging of other specified body structures: Secondary | ICD-10-CM

## 2019-05-03 DIAGNOSIS — Z1159 Encounter for screening for other viral diseases: Secondary | ICD-10-CM | POA: Insufficient documentation

## 2019-05-03 DIAGNOSIS — Z87891 Personal history of nicotine dependence: Secondary | ICD-10-CM | POA: Insufficient documentation

## 2019-05-03 DIAGNOSIS — Z880 Allergy status to penicillin: Secondary | ICD-10-CM | POA: Insufficient documentation

## 2019-05-03 DIAGNOSIS — K219 Gastro-esophageal reflux disease without esophagitis: Secondary | ICD-10-CM | POA: Insufficient documentation

## 2019-05-03 DIAGNOSIS — E039 Hypothyroidism, unspecified: Secondary | ICD-10-CM | POA: Insufficient documentation

## 2019-05-03 DIAGNOSIS — N950 Postmenopausal bleeding: Secondary | ICD-10-CM

## 2019-05-03 DIAGNOSIS — Z79890 Hormone replacement therapy (postmenopausal): Secondary | ICD-10-CM | POA: Insufficient documentation

## 2019-05-03 DIAGNOSIS — Z86711 Personal history of pulmonary embolism: Secondary | ICD-10-CM | POA: Insufficient documentation

## 2019-05-03 DIAGNOSIS — Z79899 Other long term (current) drug therapy: Secondary | ICD-10-CM | POA: Insufficient documentation

## 2019-05-03 DIAGNOSIS — Z7901 Long term (current) use of anticoagulants: Secondary | ICD-10-CM | POA: Insufficient documentation

## 2019-05-03 DIAGNOSIS — Z853 Personal history of malignant neoplasm of breast: Secondary | ICD-10-CM | POA: Insufficient documentation

## 2019-05-03 DIAGNOSIS — Z888 Allergy status to other drugs, medicaments and biological substances status: Secondary | ICD-10-CM | POA: Insufficient documentation

## 2019-05-03 DIAGNOSIS — I10 Essential (primary) hypertension: Secondary | ICD-10-CM | POA: Insufficient documentation

## 2019-05-03 HISTORY — PX: DILATION AND CURETTAGE OF UTERUS: SHX78

## 2019-05-03 LAB — ABO/RH: ABO/RH(D): O NEG

## 2019-05-03 SURGERY — DILATION AND CURETTAGE
Anesthesia: General

## 2019-05-03 MED ORDER — MIDAZOLAM HCL 2 MG/2ML IJ SOLN
INTRAMUSCULAR | Status: DC | PRN
  Administered 2019-05-03 (×2): 1 mg via INTRAVENOUS

## 2019-05-03 MED ORDER — ONDANSETRON HCL 4 MG/2ML IJ SOLN
INTRAMUSCULAR | Status: AC
  Filled 2019-05-03: qty 2

## 2019-05-03 MED ORDER — DEXAMETHASONE SODIUM PHOSPHATE 4 MG/ML IJ SOLN
INTRAMUSCULAR | Status: AC
  Filled 2019-05-03: qty 1

## 2019-05-03 MED ORDER — FENTANYL CITRATE (PF) 100 MCG/2ML IJ SOLN
INTRAMUSCULAR | Status: DC | PRN
  Administered 2019-05-03 (×3): 25 ug via INTRAVENOUS

## 2019-05-03 MED ORDER — OXYCODONE-ACETAMINOPHEN 5-325 MG PO TABS: 1.0000 | ORAL_TABLET | Freq: Four times a day (QID) | ORAL | 0 refills | Status: DC | PRN

## 2019-05-03 MED ORDER — FENTANYL CITRATE (PF) 100 MCG/2ML IJ SOLN: 25.0000 ug | INTRAMUSCULAR | Status: DC | PRN

## 2019-05-03 MED ORDER — LACTATED RINGERS IV SOLN
INTRAVENOUS | Status: DC
  Administered 2019-05-03: 07:00:00 via INTRAVENOUS

## 2019-05-03 MED ORDER — DEXAMETHASONE SODIUM PHOSPHATE 10 MG/ML IJ SOLN
INTRAMUSCULAR | Status: DC | PRN
  Administered 2019-05-03: 4 mg via INTRAVENOUS

## 2019-05-03 MED ORDER — ACETAMINOPHEN 325 MG PO TABS: 650.0000 mg | ORAL_TABLET | ORAL | Status: DC | PRN

## 2019-05-03 MED ORDER — ACETAMINOPHEN 650 MG RE SUPP
650.0000 mg | RECTAL | Status: DC | PRN
  Filled 2019-05-03: qty 1

## 2019-05-03 MED ORDER — MIDAZOLAM HCL 2 MG/2ML IJ SOLN
INTRAMUSCULAR | Status: AC
  Filled 2019-05-03: qty 2

## 2019-05-03 MED ORDER — PROPOFOL 10 MG/ML IV BOLUS
INTRAVENOUS | Status: DC | PRN
  Administered 2019-05-03: 20 mg via INTRAVENOUS
  Administered 2019-05-03: 100 mg via INTRAVENOUS

## 2019-05-03 MED ORDER — FENTANYL CITRATE (PF) 100 MCG/2ML IJ SOLN
INTRAMUSCULAR | Status: AC
  Filled 2019-05-03: qty 2

## 2019-05-03 MED ORDER — ONDANSETRON HCL 4 MG/2ML IJ SOLN: 4.0000 mg | Freq: Once | INTRAMUSCULAR | Status: DC | PRN

## 2019-05-03 MED ORDER — ACETAMINOPHEN 10 MG/ML IV SOLN
INTRAVENOUS | Status: DC | PRN
  Administered 2019-05-03: 1000 mg via INTRAVENOUS

## 2019-05-03 MED ORDER — ONDANSETRON HCL 4 MG/2ML IJ SOLN
INTRAMUSCULAR | Status: DC | PRN
  Administered 2019-05-03: 4 mg via INTRAVENOUS

## 2019-05-03 MED ORDER — LIDOCAINE HCL (PF) 2 % IJ SOLN
INTRAMUSCULAR | Status: AC
  Filled 2019-05-03: qty 10

## 2019-05-03 MED ORDER — LACTATED RINGERS IV SOLN: INTRAVENOUS | Status: DC

## 2019-05-03 MED ORDER — MORPHINE SULFATE (PF) 4 MG/ML IV SOLN: 1.0000 mg | INTRAVENOUS | Status: DC | PRN

## 2019-05-03 MED ORDER — PROPOFOL 10 MG/ML IV BOLUS
INTRAVENOUS | Status: AC
  Filled 2019-05-03: qty 20

## 2019-05-03 MED ORDER — ACETAMINOPHEN NICU IV SYRINGE 10 MG/ML
INTRAVENOUS | Status: AC
  Filled 2019-05-03: qty 1

## 2019-05-03 MED ORDER — LIDOCAINE HCL (CARDIAC) PF 100 MG/5ML IV SOSY
PREFILLED_SYRINGE | INTRAVENOUS | Status: DC | PRN
  Administered 2019-05-03: 80 mg via INTRAVENOUS

## 2019-05-03 SURGICAL SUPPLY — 14 items
BAG COUNTER SPONGE EZ (MISCELLANEOUS) ×2 IMPLANT
CATH ROBINSON RED A/P 16FR (CATHETERS) ×2 IMPLANT
COVER WAND RF STERILE (DRAPES) IMPLANT
GLOVE BIO SURGEON STRL SZ8 (GLOVE) ×2 IMPLANT
GOWN STRL REUS W/ TWL LRG LVL3 (GOWN DISPOSABLE) ×1 IMPLANT
GOWN STRL REUS W/ TWL XL LVL3 (GOWN DISPOSABLE) ×1 IMPLANT
GOWN STRL REUS W/TWL LRG LVL3 (GOWN DISPOSABLE) ×1
GOWN STRL REUS W/TWL XL LVL3 (GOWN DISPOSABLE) ×1
KIT TURNOVER CYSTO (KITS) ×2 IMPLANT
PACK DNC HYST (MISCELLANEOUS) ×2 IMPLANT
PAD OB MATERNITY 4.3X12.25 (PERSONAL CARE ITEMS) ×2 IMPLANT
PAD PREP 24X41 OB/GYN DISP (PERSONAL CARE ITEMS) ×2 IMPLANT
SEAL ROD LENS SCOPE MYOSURE (ABLATOR) ×1 IMPLANT
TOWEL OR 17X26 4PK STRL BLUE (TOWEL DISPOSABLE) ×2 IMPLANT

## 2019-05-03 NOTE — Anesthesia Preprocedure Evaluation (Addendum)
Anesthesia Evaluation  Patient identified by MRN, date of birth, ID band Patient awake    Reviewed: Allergy & Precautions, NPO status , Patient's Chart, lab work & pertinent test results, reviewed documented beta blocker date and time   Airway Mallampati: III  TM Distance: >3 FB     Dental  (+) Upper Dentures, Lower Dentures   Pulmonary pneumonia, resolved, former smoker,           Cardiovascular hypertension, Pt. on medications      Neuro/Psych    GI/Hepatic GERD  Controlled,  Endo/Other  Hypothyroidism   Renal/GU      Musculoskeletal   Abdominal   Peds  Hematology   Anesthesia Other Findings   Reproductive/Obstetrics                            Anesthesia Physical Anesthesia Plan  ASA: III  Anesthesia Plan: General   Post-op Pain Management:    Induction: Intravenous  PONV Risk Score and Plan:   Airway Management Planned: LMA  Additional Equipment:   Intra-op Plan:   Post-operative Plan:   Informed Consent: I have reviewed the patients History and Physical, chart, labs and discussed the procedure including the risks, benefits and alternatives for the proposed anesthesia with the patient or authorized representative who has indicated his/her understanding and acceptance.       Plan Discussed with: CRNA  Anesthesia Plan Comments:         Anesthesia Quick Evaluation

## 2019-05-03 NOTE — Discharge Instructions (Addendum)
Dilation and Curettage or Vacuum Curettage, Care After This sheet gives you information about how to care for yourself after your procedure. Your health care provider may also give you more specific instructions. If you have problems or questions, contact your health care provider. What can I expect after the procedure? After your procedure, it is common to have:  Mild pain or cramping.  Some vaginal bleeding or spotting. These may last for up to 2 weeks after your procedure. Follow these instructions at home: Activity   Do not drive or use heavy machinery while taking prescription pain medicine.  Avoid driving for the first 24 hours after your procedure.  Take frequent, short walks, followed by rest periods, throughout the day. Ask your health care provider what activities are safe for you. After 1-2 days, you may be able to return to your normal activities.  Do not lift anything heavier than 10 lb (4.5 kg) until your health care provider approves.  For at least 2 weeks, or as long as told by your health care provider, do not: ? Douche. ? Use tampons. ? Have sexual intercourse. General instructions   Take over-the-counter and prescription medicines only as told by your health care provider. This is especially important if you take blood thinning medicine.  Do not take baths, swim, or use a hot tub until your health care provider approves. Take showers instead of baths.  Wear compression stockings as told by your health care provider. These stockings help to prevent blood clots and reduce swelling in your legs.  It is your responsibility to get the results of your procedure. Ask your health care provider, or the department performing the procedure, when your results will be ready.  Keep all follow-up visits as told by your health care provider. This is important. Contact a health care provider if:  You have severe cramps that get worse or that do not get better with  medicine.  You have severe abdominal pain.  You cannot drink fluids without vomiting.  You develop pain in a different area of your pelvis.  You have bad-smelling vaginal discharge.  You have a rash. Get help right away if:  You have vaginal bleeding that soaks more than one sanitary pad in 1 hour, for 2 hours in a row.  You pass large blood clots from your vagina.  You have a fever that is above 100.42F (38.0C).  Your abdomen feels very tender or hard.  You have chest pain.  You have shortness of breath.  You cough up blood.  You feel dizzy or light-headed.  You faint.  You have pain in your neck or shoulder area. This information is not intended to replace advice given to you by your health care provider. Make sure you discuss any questions you have with your health care provider. Document Released: 11/05/2000 Document Revised: 07/07/2016 Document Reviewed: 06/10/2016 Elsevier Interactive Patient Education  2019 [place]   1) The drugs that you were given will stay in your system until tomorrow so for the next 24 hours you should not:  A) Drive an automobile B) Make any legal decisions C) Drink any alcoholic beverage   2) You may resume regular meals tomorrow.  Today it is better to start with liquids and gradually work up to solid foods.  You may eat anything you prefer, but it is better to start with liquids, then soup and crackers, and gradually work up to solid foods.  3) Please notify your doctor immediately if you have any unusual bleeding, trouble breathing, redness and pain at the surgery site, drainage, fever, or pain not relieved by medication.    4) Additional Instructions:        Please contact your physician with any problems or Same Day Surgery at [phone], Monday through Friday 6 am to 4 pm, or [place] at [facility] number at [phone].

## 2019-05-03 NOTE — Anesthesia Procedure Notes (Signed)
Procedure Name: LMA Insertion Date/Time: 05/03/2019 7:39 AM Performed by: Eben Burow, CRNA Pre-anesthesia Checklist: Patient identified, Emergency Drugs available, Suction available and Patient being monitored Patient Re-evaluated:Patient Re-evaluated prior to induction Oxygen Delivery Method: Circle system utilized Preoxygenation: Pre-oxygenation with 100% oxygen Induction Type: IV induction LMA: LMA inserted LMA Size: 4.0 Number of attempts: 1 Placement Confirmation: positive ETCO2 and breath sounds checked- equal and bilateral Tube secured with: Tape Dental Injury: Teeth and Oropharynx as per pre-operative assessment

## 2019-05-03 NOTE — Transfer of Care (Signed)
Immediate Anesthesia Transfer of Care Note  Patient: Latoya Freeman  Procedure(s) Performed: DILATATION AND CURETTAGE (N/A )  Patient Location: PACU  Anesthesia Type:General  Level of Consciousness: drowsy  Airway & Oxygen Therapy: Patient Spontanous Breathing and Patient connected to face mask oxygen  Post-op Assessment: Report given to RN and Post -op Vital signs reviewed and stable  Post vital signs: Reviewed and stable  Last Vitals:  Vitals Value Taken Time  BP 120/60 05/03/19 0805  Temp 36.7 C 05/03/19 0805  Pulse 72 05/03/19 0808  Resp 12 05/03/19 0808  SpO2 94 % 05/03/19 0808  Vitals shown include unvalidated device data.  Last Pain:  Vitals:   05/03/19 0805  TempSrc:   PainSc: Asleep         Complications: No apparent anesthesia complications

## 2019-05-03 NOTE — Interval H&P Note (Signed)
History and Physical Interval Note:  05/03/2019 7:21 AM  Latoya Freeman  has presented today for surgery, with the diagnosis of POST MENOPAUSAL BLEEDING.  The various methods of treatment have been discussed with the patient and family. After consideration of risks, benefits and other options for treatment, the patient has consented to  Procedure(s): DILATATION AND CURETTAGE (N/A) as a surgical intervention.  The patient's history has been reviewed, patient examined, no change in status, stable for surgery.  I have reviewed the patient's chart and labs.  Questions were answered to the patient's satisfaction.     Hoyt Koch

## 2019-05-03 NOTE — Anesthesia Post-op Follow-up Note (Signed)
Anesthesia QCDR form completed.        

## 2019-05-03 NOTE — Anesthesia Postprocedure Evaluation (Signed)
Anesthesia Post Note  Patient: Latoya Freeman  Procedure(s) Performed: DILATATION AND CURETTAGE (N/A )  Patient location during evaluation: PACU Anesthesia Type: General Level of consciousness: awake and alert Pain management: pain level controlled Vital Signs Assessment: post-procedure vital signs reviewed and stable Respiratory status: spontaneous breathing, nonlabored ventilation, respiratory function stable and patient connected to nasal cannula oxygen Cardiovascular status: blood pressure returned to baseline and stable Postop Assessment: no apparent nausea or vomiting Anesthetic complications: no     Last Vitals:  Vitals:   05/03/19 0850 05/03/19 0901  BP: 123/66 129/79  Pulse: 60 61  Resp: 10 14  Temp: 36.7 C 36.9 C  SpO2: 97% 98%    Last Pain:  Vitals:   05/03/19 0901  TempSrc: Oral  PainSc: 0-No pain                 Marybell Robards S

## 2019-05-03 NOTE — Op Note (Signed)
Operative Note  05/03/2019  PRE-OP DIAGNOSIS: Post menopausal bleeding Endometrial thickening  POST-OP DIAGNOSIS: Same   SURGEON: Barnett Applebaum, MD, FACOG  PROCEDURE: Procedure(s): DILATATION AND CURETTAGE   ANESTHESIA: Choice   ESTIMATED BLOOD LOSS: Min  SPECIMENS: ECC and EMC  COMPLICATIONS: None  DISPOSITION: PACU - hemodynamically stable.  CONDITION: stable  FINDINGS: Exam under anesthesia revealed small, mobile  uterus with no masses and bilateral adnexa without masses or fullness.   PROCEDURE IN DETAIL: After informed consent was obtained, the patient was taken to the operating room where anesthesia was obtained without difficulty. The patient was positioned in the dorsal lithotomy position in [place]. The patient's bladder was catheterized with an in and out foley catheter. The patient was examined under anesthesia, with the above noted findings. The weighted speculum was placed inside the patient's vagina, and the the anterior lip of the cervix was seen and grasped with the tenaculum.  An endocervical curettage was performed using a kevorkian curette.  The uterine cavity was sounded to 7cm, and then the cervix was progressively dilated to a 20 French-Pratt dilator. The uterine cavity was curetted until a gritty texture was noted, yielding specimen for endometrial curettings. Excellent hemostasis was noted, and all instruments were removed, with excellent hemostasis noted throughout. She was then taken out of dorsal lithotomy. The patient tolerated the procedure well. Sponge, lap and needle counts were correct x2. The patient was taken to recovery room in excellent condition.  Barnett Applebaum, MD, [facility], [facility] 05/03/2019  8:06 AM

## 2019-05-04 LAB — BPAM RBC
Blood Product Expiration Date: 202006252359
Blood Product Expiration Date: 202006252359
Unit Type and Rh: 5100
Unit Type and Rh: 5100

## 2019-05-04 LAB — TYPE AND SCREEN
ABO/RH(D): O NEG
Antibody Screen: POSITIVE
Unit division: 0
Unit division: 0

## 2019-05-04 LAB — SURGICAL PATHOLOGY

## 2019-05-07 ENCOUNTER — Encounter: Payer: Self-pay | Admitting: Oncology

## 2019-05-14 ENCOUNTER — Other Ambulatory Visit: Payer: Self-pay | Admitting: Obstetrics & Gynecology

## 2019-05-21 ENCOUNTER — Encounter: Payer: Self-pay | Admitting: Obstetrics & Gynecology

## 2019-05-21 ENCOUNTER — Other Ambulatory Visit: Payer: Self-pay

## 2019-05-21 ENCOUNTER — Ambulatory Visit (INDEPENDENT_AMBULATORY_CARE_PROVIDER_SITE_OTHER): Payer: Medicare HMO | Admitting: Obstetrics & Gynecology

## 2019-05-21 VITALS — BP 120/56 | Wt 198.0 lb

## 2019-05-21 DIAGNOSIS — N950 Postmenopausal bleeding: Secondary | ICD-10-CM

## 2019-05-21 NOTE — Progress Notes (Signed)
  Postoperative Follow-up Patient presents post op from [facility] for PMB, 3 weeks ago. Pathology: DIAGNOSIS:  A. UTERINE CERVIX; ENDOCERVICAL CURETTINGS:  - BENIGN ECTOCERVICAL AND ENDOCERVICAL MUCOSA.  - BENIGN INACTIVE ENDOMETRIAL TISSUE FRAGMENTS.   B. UTERINE CORPUS; ENDOMETRIAL CURETTINGS:  - SCANTY FRAGMENTS OF INACTIVE ENDOMETRIUM.  - FEW FRAGMENTS OF BENIGN ECTOCERVICAL AND ENDOCERVICAL MUCOSA.  - NEGATIVE FOR HYPERPLASIA AND MALIGNANCY.  Subjective: Patient reports some improvement in her preop symptoms. She has had a couple of episodes of bleeding since surgery.  Eating a regular diet without difficulty. The patient is not having any pain.  Activity: normal activities of daily living. Patient reports additional symptom's since surgery of None.  Objective: BP (!) 120/56   Wt 198 lb (89.8 kg)   BMI 37.41 kg/m  Physical Exam Constitutional:      General: She is not in acute distress.    Appearance: She is well-developed.  Musculoskeletal: Normal range of motion.  Neurological:     Mental Status: She is alert and oriented to person, place, and time.  Skin:    General: Skin is warm and dry.  Vitals signs reviewed.    Assessment: s/p :  D&C progressing well  Plan: Patient has done well after surgery with no apparent complications.  I have discussed the post-operative course to date, and the expected progress moving forward.  The patient understands what complications to be concerned about.  I will see the patient in routine follow up, or sooner if needed.    Activity plan: No restriction. Monitor for bleeding episodes Pathology reassuring,  PMB most likely from Eliquis. To be managed bt PCP and Hematology as far as blood thinner therapy is concerned  Hoyt Koch 05/21/2019, 10:02 AM

## 2019-05-22 ENCOUNTER — Other Ambulatory Visit: Payer: Self-pay | Admitting: Family Medicine

## 2019-05-22 DIAGNOSIS — Z1231 Encounter for screening mammogram for malignant neoplasm of breast: Secondary | ICD-10-CM

## 2019-06-04 DIAGNOSIS — E039 Hypothyroidism, unspecified: Secondary | ICD-10-CM | POA: Diagnosis not present

## 2019-06-04 DIAGNOSIS — J0100 Acute maxillary sinusitis, unspecified: Secondary | ICD-10-CM | POA: Diagnosis not present

## 2019-06-04 DIAGNOSIS — M25561 Pain in right knee: Secondary | ICD-10-CM | POA: Diagnosis not present

## 2019-06-04 DIAGNOSIS — I10 Essential (primary) hypertension: Secondary | ICD-10-CM | POA: Diagnosis not present

## 2019-06-04 DIAGNOSIS — R42 Dizziness and giddiness: Secondary | ICD-10-CM | POA: Diagnosis not present

## 2019-06-20 DIAGNOSIS — M1711 Unilateral primary osteoarthritis, right knee: Secondary | ICD-10-CM | POA: Diagnosis not present

## 2019-06-20 DIAGNOSIS — M25561 Pain in right knee: Secondary | ICD-10-CM | POA: Diagnosis not present

## 2019-06-28 DIAGNOSIS — Z961 Presence of intraocular lens: Secondary | ICD-10-CM | POA: Diagnosis not present

## 2019-06-29 ENCOUNTER — Ambulatory Visit
Admission: RE | Admit: 2019-06-29 | Discharge: 2019-06-29 | Disposition: A | Discharge status: Home / Self Care | Payer: Medicare HMO | Source: Ambulatory Visit | Attending: Family Medicine | Admitting: Family Medicine

## 2019-06-29 ENCOUNTER — Other Ambulatory Visit: Payer: Self-pay

## 2019-06-29 DIAGNOSIS — Z1231 Encounter for screening mammogram for malignant neoplasm of breast: Secondary | ICD-10-CM | POA: Insufficient documentation

## 2019-07-04 ENCOUNTER — Other Ambulatory Visit: Payer: Self-pay | Admitting: Family Medicine

## 2019-07-04 DIAGNOSIS — N6489 Other specified disorders of breast: Secondary | ICD-10-CM

## 2019-07-04 DIAGNOSIS — R928 Other abnormal and inconclusive findings on diagnostic imaging of breast: Secondary | ICD-10-CM

## 2019-07-10 ENCOUNTER — Ambulatory Visit
Admission: RE | Admit: 2019-07-10 | Discharge: 2019-07-10 | Disposition: A | Discharge status: Home / Self Care | Payer: Medicare HMO | Source: Ambulatory Visit | Attending: Family Medicine | Admitting: Family Medicine

## 2019-07-10 ENCOUNTER — Other Ambulatory Visit: Payer: Self-pay

## 2019-07-10 DIAGNOSIS — R922 Inconclusive mammogram: Secondary | ICD-10-CM | POA: Diagnosis not present

## 2019-07-10 DIAGNOSIS — N6489 Other specified disorders of breast: Secondary | ICD-10-CM

## 2019-07-10 DIAGNOSIS — R928 Other abnormal and inconclusive findings on diagnostic imaging of breast: Secondary | ICD-10-CM

## 2019-09-06 DIAGNOSIS — M9902 Segmental and somatic dysfunction of thoracic region: Secondary | ICD-10-CM | POA: Diagnosis not present

## 2019-09-06 DIAGNOSIS — M62830 Muscle spasm of back: Secondary | ICD-10-CM | POA: Diagnosis not present

## 2019-09-06 DIAGNOSIS — M9903 Segmental and somatic dysfunction of lumbar region: Secondary | ICD-10-CM | POA: Diagnosis not present

## 2019-09-06 DIAGNOSIS — M5134 Other intervertebral disc degeneration, thoracic region: Secondary | ICD-10-CM | POA: Diagnosis not present

## 2019-09-12 DIAGNOSIS — M9902 Segmental and somatic dysfunction of thoracic region: Secondary | ICD-10-CM | POA: Diagnosis not present

## 2019-09-12 DIAGNOSIS — M5134 Other intervertebral disc degeneration, thoracic region: Secondary | ICD-10-CM | POA: Diagnosis not present

## 2019-09-12 DIAGNOSIS — M62830 Muscle spasm of back: Secondary | ICD-10-CM | POA: Diagnosis not present

## 2019-09-12 DIAGNOSIS — M9903 Segmental and somatic dysfunction of lumbar region: Secondary | ICD-10-CM | POA: Diagnosis not present

## 2019-09-19 DIAGNOSIS — M5134 Other intervertebral disc degeneration, thoracic region: Secondary | ICD-10-CM | POA: Diagnosis not present

## 2019-09-19 DIAGNOSIS — M9903 Segmental and somatic dysfunction of lumbar region: Secondary | ICD-10-CM | POA: Diagnosis not present

## 2019-09-19 DIAGNOSIS — M62830 Muscle spasm of back: Secondary | ICD-10-CM | POA: Diagnosis not present

## 2019-09-19 DIAGNOSIS — M9902 Segmental and somatic dysfunction of thoracic region: Secondary | ICD-10-CM | POA: Diagnosis not present

## 2019-09-26 DIAGNOSIS — M5134 Other intervertebral disc degeneration, thoracic region: Secondary | ICD-10-CM | POA: Diagnosis not present

## 2019-09-26 DIAGNOSIS — M9903 Segmental and somatic dysfunction of lumbar region: Secondary | ICD-10-CM | POA: Diagnosis not present

## 2019-09-26 DIAGNOSIS — M9902 Segmental and somatic dysfunction of thoracic region: Secondary | ICD-10-CM | POA: Diagnosis not present

## 2019-09-26 DIAGNOSIS — M62830 Muscle spasm of back: Secondary | ICD-10-CM | POA: Diagnosis not present

## 2019-10-06 ENCOUNTER — Emergency Department
Admission: EM | Admit: 2019-10-06 | Discharge: 2019-10-07 | Disposition: A | Discharge status: Home / Self Care | Payer: Medicare HMO | Attending: Emergency Medicine | Admitting: Emergency Medicine

## 2019-10-06 ENCOUNTER — Emergency Department: Payer: Medicare HMO

## 2019-10-06 ENCOUNTER — Other Ambulatory Visit: Payer: Self-pay

## 2019-10-06 ENCOUNTER — Encounter: Payer: Self-pay | Admitting: Emergency Medicine

## 2019-10-06 DIAGNOSIS — Z79899 Other long term (current) drug therapy: Secondary | ICD-10-CM | POA: Diagnosis not present

## 2019-10-06 DIAGNOSIS — Z87891 Personal history of nicotine dependence: Secondary | ICD-10-CM | POA: Insufficient documentation

## 2019-10-06 DIAGNOSIS — I10 Essential (primary) hypertension: Secondary | ICD-10-CM | POA: Diagnosis not present

## 2019-10-06 DIAGNOSIS — Z20828 Contact with and (suspected) exposure to other viral communicable diseases: Secondary | ICD-10-CM | POA: Diagnosis not present

## 2019-10-06 DIAGNOSIS — Z7901 Long term (current) use of anticoagulants: Secondary | ICD-10-CM | POA: Insufficient documentation

## 2019-10-06 DIAGNOSIS — M549 Dorsalgia, unspecified: Secondary | ICD-10-CM

## 2019-10-06 DIAGNOSIS — R42 Dizziness and giddiness: Secondary | ICD-10-CM | POA: Insufficient documentation

## 2019-10-06 DIAGNOSIS — R059 Cough, unspecified: Secondary | ICD-10-CM

## 2019-10-06 DIAGNOSIS — E039 Hypothyroidism, unspecified: Secondary | ICD-10-CM | POA: Insufficient documentation

## 2019-10-06 DIAGNOSIS — R05 Cough: Secondary | ICD-10-CM

## 2019-10-06 LAB — CBC
HCT: 37.7 % (ref 36.0–46.0)
Hemoglobin: 12.8 g/dL (ref 12.0–15.0)
MCH: 28.3 pg (ref 26.0–34.0)
MCHC: 34 g/dL (ref 30.0–36.0)
MCV: 83.4 fL (ref 80.0–100.0)
Platelets: 271 10*3/uL (ref 150–400)
RBC: 4.52 MIL/uL (ref 3.87–5.11)
RDW: 13.7 % (ref 11.5–15.5)
WBC: 8 10*3/uL (ref 4.0–10.5)
nRBC: 0 % (ref 0.0–0.2)

## 2019-10-06 LAB — BASIC METABOLIC PANEL
Anion gap: 9 (ref 5–15)
BUN: 14 mg/dL (ref 8–23)
CO2: 27 mmol/L (ref 22–32)
Calcium: 10.1 mg/dL (ref 8.9–10.3)
Chloride: 106 mmol/L (ref 98–111)
Creatinine, Ser: 1.38 mg/dL — ABNORMAL HIGH (ref 0.44–1.00)
GFR calc Af Amer: 43 mL/min — ABNORMAL LOW (ref >60)
GFR calc non Af Amer: 37 mL/min — ABNORMAL LOW (ref >60)
Glucose, Bld: 129 mg/dL — ABNORMAL HIGH (ref 70–99)
Potassium: 3.3 mmol/L — ABNORMAL LOW (ref 3.5–5.1)
Sodium: 142 mmol/L (ref 135–145)

## 2019-10-06 LAB — TROPONIN I (HIGH SENSITIVITY): Troponin I (High Sensitivity): 5 ng/L (ref <18)

## 2019-10-06 MED ORDER — SODIUM CHLORIDE 0.9 % IV BOLUS
1000.0000 mL | Freq: Once | INTRAVENOUS | Status: AC
  Administered 2019-10-06: 1000 mL via INTRAVENOUS

## 2019-10-06 NOTE — ED Notes (Signed)
Pt resting quietly in bed; no complaints or requests; side rails up with call bell in reach; IV fluids infusing without difficulty; site unremarkable

## 2019-10-06 NOTE — ED Triage Notes (Addendum)
Pt arrived via POV with reports of feeling "swimmy headed" this morning and having dizziness.  Pt reports not feeling well overall and feeling weak for the past 2, 3, 4 days.

## 2019-10-06 NOTE — ED Provider Notes (Addendum)
[facility] Emergency Department Provider Note   ____________________________________________   First MD Initiated Contact with Patient 10/06/19 2149     (approximate)  I have reviewed the triage vital signs and the nursing notes.   HISTORY  Chief Complaint Dizziness  HPI Latoya Freeman is a 78 y.o. female who reports some lightheadedness today in the afternoon.  Gotten worse when she got up and walked around went away when she was sitting and laying down.  She has also had a cough and congestion for a few days some sinus drainage but very small amount.  No other complaints.  No fever no shortness of breath         Past Medical History:  Diagnosis Date  . Aneurysm ([place]) 1980  . Cancer ([place])   . GERD (gastroesophageal reflux disease)   . Hypertension 1980  . Hypothyroidism 1999  . Pneumonia   . Pulmonary embolism ([place])   . Shingles   . Wears dentures    full upper and lower    Patient Active Problem List   Diagnosis Date Noted  . Cyst of left ovary 04/04/2019  . Postmenopausal bleeding 04/04/2019  . Endometrial thickening on ultrasound 04/04/2019  . Pulmonary embolism ([place])   . GERD (gastroesophageal reflux disease) 08/07/2018  . Hypertension 08/07/2018  . Shingles 08/07/2018  . Thyroid disease 08/07/2018  . HCAP (healthcare-associated pneumonia) 07/27/2018    Past Surgical History:  Procedure Laterality Date  . BRAIN SURGERY     aneurysm  . BREAST CYST ASPIRATION Left 2005  . BREAST EXCISIONAL BIOPSY Left 2013   atypical ductal hyperplasia  . BREAST SURGERY  2013   LF Breast Wide EXC   . CHOLECYSTECTOMY  2004  . COLONOSCOPY    . COLONOSCOPY WITH PROPOFOL N/A 01/31/2018   Procedure: COLONOSCOPY WITH PROPOFOL;  Surgeon: Lollie Sails, MD;  Location: [facility] ENDOSCOPY;  Service: Endoscopy;  Laterality: N/A;  . DILATION AND CURETTAGE OF UTERUS N/A 05/03/2019   Procedure: DILATATION AND CURETTAGE;  Surgeon: Gae Dry, MD;   Location: [facility] ORS;  Service: Gynecology;  Laterality: N/A;  . ESOPHAGOGASTRODUODENOSCOPY N/A 01/31/2018   Procedure: ESOPHAGOGASTRODUODENOSCOPY (EGD);  Surgeon: Lollie Sails, MD;  Location: [facility] ENDOSCOPY;  Service: Endoscopy;  Laterality: N/A;  . SHOULDER ARTHROSCOPY Left 07/25/2018   Procedure: SHOULDER MINI OPEN ROTATOR CUFF REPAIR  BICEPS TENDOSIS ARTHROSCOPIC DISTAL CLAVICLE EXCISION  SUBACROMIAL DECOMP;  Surgeon: Leim Fabry, MD;  Location: [place];  Service: Orthopedics;  Laterality: Left;  [place] & NEPHEW HEAD COIL ANCHOR FOOTPRINT ANCHOR QFIX ANCHOR    Prior to Admission medications   Medication Sig Start Date End Date Taking? Authorizing Provider  amLODipine (NORVASC) 5 MG tablet Take 5 mg by mouth daily.  02/07/13   [provider]  apixaban (ELIQUIS) 5 MG TABS tablet Take 5 mg by mouth 2 (two) times daily.    [provider]  citalopram (CELEXA) 20 MG tablet Take 20 mg by mouth daily. 04/23/19   [provider]  Cyanocobalamin (VITAMIN B-12 PO) Take 1 tablet by mouth daily.     [provider]  hydrochlorothiazide (HYDRODIURIL) 25 MG tablet Take 25 mg by mouth daily.  01/25/13   [provider]  levothyroxine (SYNTHROID, LEVOTHROID) 75 MCG tablet Take 75 mcg by mouth daily.    [provider]  losartan (COZAAR) 50 MG tablet Take 50 mg by mouth daily.     [provider]  omeprazole ([place])  20 MG capsule Take 20 mg by mouth daily.    [provider]  potassium chloride (K-DUR,KLOR-CON) 10 MEQ tablet Take 20 mEq by mouth daily. 07/12/18   [provider]    Allergies Bee venom, Shellfish allergy, Tamoxifen, and Penicillins  Family History  Problem Relation Age of Onset  . Breast cancer Neg Hx     Social History Social History   Tobacco Use  . Smoking status: Former Smoker    Packs/day: 1.50    Years: 20.00    Pack years: 30.00    Quit date: 1990     Years since quitting: 30.8  . Smokeless tobacco: Never Used  Substance Use Topics  . Alcohol use: No  . Drug use: No    Review of Systems  Constitutional: No fever/chills Eyes: No visual changes. ENT: No sore throat. Cardiovascular: Denies chest pain. Respiratory: Denies shortness of breath. Gastrointestinal: No abdominal pain.  No nausea, no vomiting.  No diarrhea.  No constipation. Genitourinary: Negative for dysuria. Musculoskeletal: Negative for back pain. Skin: Negative for rash. Neurological: Negative for headaches, focal weakness   ____________________________________________   PHYSICAL EXAM:  VITAL SIGNS: ED Triage Vitals  Enc Vitals Group     BP 10/06/19 1725 (!) 147/67     Pulse Rate 10/06/19 1725 71     Resp 10/06/19 1725 18     Temp 10/06/19 1725 99 F (37.2 C)     Temp Source 10/06/19 1725 Oral     SpO2 10/06/19 1725 97 %     Weight 10/06/19 1725 190 lb (86.2 kg)     Height 10/06/19 1725 5\' 1"  (1.549 m)     Head Circumference --      Peak Flow --      Pain Score 10/06/19 2143 1     Pain Loc --      Pain Edu? --      Excl. in [place]? --     Constitutional: Alert and oriented. Well appearing and in no acute distress. Eyes: Conjunctivae are normal. PER. EOMI. Head: Atraumatic. Nose: No congestion/rhinnorhea. Mouth/Throat: Mucous membranes are moist.  Oropharynx non-erythematous. Neck: No stridor.   Cardiovascular: Normal rate, regular rhythm. Grossly normal heart sounds.  Good peripheral circulation. Respiratory: Normal respiratory effort.  No retractions. Lungs CTAB. Gastrointestinal: Soft and nontender. No distention. No abdominal bruits. No CVA tenderness. Musculoskeletal: No lower extremity tenderness nor edema. . Neurologic:  Normal speech and language. No gross focal neurologic deficits are appreciated. No gait instability. Skin:  Skin is warm, dry and intact. No rash noted.   ____________________________________________   LABS (all labs  ordered are listed, but only abnormal results are displayed)  Labs Reviewed  BASIC METABOLIC PANEL - Abnormal; Notable for the following components:      Result Value   Potassium 3.3 (*)    Glucose, Bld 129 (*)    Creatinine, Ser 1.38 (*)    GFR calc non Af Amer 37 (*)    GFR calc Af Amer 43 (*)    All other components within normal limits  SARS CORONAVIRUS 2 (TAT 6-24 HRS)  CBC  URINALYSIS, COMPLETE (UACMP) WITH MICROSCOPIC  CBG MONITORING, ED  TROPONIN I (HIGH SENSITIVITY)  TROPONIN I (HIGH SENSITIVITY)   ____________________________________________  EKG EKG read interpreted by me shows normal sinus rhythm rate of 65 left axis decreased R wave progression no acute ST-T changes no EKGs in the last 5 years  ____________________________________________  RADIOLOGY  ED MD interpretation: X-ray read by  radiology reviewed by me shows only some bibasilar atelectasis.  Official radiology report(s): Dg Chest 1 View  Result Date: 10/06/2019 CLINICAL DATA:  Cough, right back pain EXAM: CHEST  1 VIEW COMPARISON:  11/10/2018 FINDINGS: Low lung volumes. Heart is normal size. Bibasilar atelectasis or scarring. No effusions or acute bony abnormality. IMPRESSION: Low volumes.  Bibasilar atelectasis or scarring. Electronically Signed   By: Rolm Baptise M.D.   On: 10/06/2019 22:04    ____________________________________________   PROCEDURES  Procedure(s) performed (including Critical Care):  Procedures   ____________________________________________   INITIAL IMPRESSION / ASSESSMENT AND PLAN / ED COURSE Patient is slightly orthostatic.  We will give her some fluids anticipate will be to let her go.  She looks well and is not running a fever here.  Chest x-ray looks well to.      ----------------------------------------- 12:04 AM on 10/07/2019 -----------------------------------------  Patient is feeling better we will let her go.  She does not have any productive cough nothing  but a little bit of clear nasal drainage.  There is no sinus tenderness.  Do not think she needs antibiotics at this point.         ____________________________________________   FINAL CLINICAL IMPRESSION(S) / ED DIAGNOSES  Final diagnoses:  Lightheadedness     ED Discharge Orders    None       Note:  This document was prepared using Dragon voice recognition software and may include unintentional dictation errors.    Nena Polio, MD 10/06/19 6578    Nena Polio, MD 10/07/19 0005

## 2019-10-06 NOTE — ED Notes (Signed)
Pt reports no dizziness moving from sitting to standing;

## 2019-10-06 NOTE — ED Notes (Signed)
Pt with no dizziness moving from laying to sitting

## 2019-10-06 NOTE — ED Notes (Signed)
Pt reports sinus congestion/drainage for a few days; occasional dry cough; denies fever; denies shortness of breath; light-headed feeling that started today around 3pm, only when up and walking around; pt awake and alert; talking in complete coherent sentences;

## 2019-10-07 NOTE — Discharge Instructions (Addendum)
Please return for worsening lightheadedness, fever, shortness of breath or feeling sicker at all.  Get your doctor to check on you in Monday or Tuesday unless you are well.

## 2019-10-19 ENCOUNTER — Emergency Department
Admission: EM | Admit: 2019-10-19 | Discharge: 2019-10-19 | Disposition: A | Discharge status: Home / Self Care | Payer: Medicare HMO | Attending: Emergency Medicine | Admitting: Emergency Medicine

## 2019-10-19 ENCOUNTER — Emergency Department: Payer: Medicare HMO

## 2019-10-19 ENCOUNTER — Encounter: Payer: Self-pay | Admitting: Emergency Medicine

## 2019-10-19 ENCOUNTER — Other Ambulatory Visit: Payer: Self-pay

## 2019-10-19 DIAGNOSIS — I10 Essential (primary) hypertension: Secondary | ICD-10-CM | POA: Insufficient documentation

## 2019-10-19 DIAGNOSIS — Z96612 Presence of left artificial shoulder joint: Secondary | ICD-10-CM | POA: Diagnosis not present

## 2019-10-19 DIAGNOSIS — Z87891 Personal history of nicotine dependence: Secondary | ICD-10-CM | POA: Insufficient documentation

## 2019-10-19 DIAGNOSIS — Z79899 Other long term (current) drug therapy: Secondary | ICD-10-CM | POA: Insufficient documentation

## 2019-10-19 DIAGNOSIS — R1031 Right lower quadrant pain: Secondary | ICD-10-CM | POA: Diagnosis not present

## 2019-10-19 DIAGNOSIS — E039 Hypothyroidism, unspecified: Secondary | ICD-10-CM | POA: Insufficient documentation

## 2019-10-19 DIAGNOSIS — Z7901 Long term (current) use of anticoagulants: Secondary | ICD-10-CM | POA: Insufficient documentation

## 2019-10-19 DIAGNOSIS — R109 Unspecified abdominal pain: Secondary | ICD-10-CM

## 2019-10-19 LAB — URINALYSIS, COMPLETE (UACMP) WITH MICROSCOPIC
Bilirubin Urine: NEGATIVE
Glucose, UA: NEGATIVE mg/dL
Hgb urine dipstick: NEGATIVE
Ketones, ur: NEGATIVE mg/dL
Leukocytes,Ua: NEGATIVE
Nitrite: NEGATIVE
Protein, ur: NEGATIVE mg/dL
Specific Gravity, Urine: 1.011 (ref 1.005–1.030)
pH: 6 (ref 5.0–8.0)

## 2019-10-19 LAB — COMPREHENSIVE METABOLIC PANEL
ALT: 17 U/L (ref 0–44)
AST: 20 U/L (ref 15–41)
Albumin: 4 g/dL (ref 3.5–5.0)
Alkaline Phosphatase: 61 U/L (ref 38–126)
Anion gap: 10 (ref 5–15)
BUN: 18 mg/dL (ref 8–23)
CO2: 24 mmol/L (ref 22–32)
Calcium: 9.9 mg/dL (ref 8.9–10.3)
Chloride: 106 mmol/L (ref 98–111)
Creatinine, Ser: 1.61 mg/dL — ABNORMAL HIGH (ref 0.44–1.00)
GFR calc Af Amer: 35 mL/min — ABNORMAL LOW (ref >60)
GFR calc non Af Amer: 31 mL/min — ABNORMAL LOW (ref >60)
Glucose, Bld: 109 mg/dL — ABNORMAL HIGH (ref 70–99)
Potassium: 3.6 mmol/L (ref 3.5–5.1)
Sodium: 140 mmol/L (ref 135–145)
Total Bilirubin: 0.6 mg/dL (ref 0.3–1.2)
Total Protein: 7.7 g/dL (ref 6.5–8.1)

## 2019-10-19 LAB — CBC
HCT: 36 % (ref 36.0–46.0)
Hemoglobin: 12.3 g/dL (ref 12.0–15.0)
MCH: 28.1 pg (ref 26.0–34.0)
MCHC: 34.2 g/dL (ref 30.0–36.0)
MCV: 82.2 fL (ref 80.0–100.0)
Platelets: 308 10*3/uL (ref 150–400)
RBC: 4.38 MIL/uL (ref 3.87–5.11)
RDW: 13.4 % (ref 11.5–15.5)
WBC: 8.9 10*3/uL (ref 4.0–10.5)
nRBC: 0 % (ref 0.0–0.2)

## 2019-10-19 LAB — LIPASE, BLOOD: Lipase: 47 U/L (ref 11–51)

## 2019-10-19 MED ORDER — SODIUM CHLORIDE 0.9 % IV BOLUS
500.0000 mL | Freq: Once | INTRAVENOUS | Status: AC
  Administered 2019-10-19: 500 mL via INTRAVENOUS

## 2019-10-19 MED ORDER — SODIUM CHLORIDE 0.9% FLUSH: 3.0000 mL | Freq: Once | INTRAVENOUS | Status: DC

## 2019-10-19 MED ORDER — IOHEXOL 300 MG/ML  SOLN
75.0000 mL | Freq: Once | INTRAMUSCULAR | Status: AC | PRN
  Administered 2019-10-19: 75 mL via INTRAVENOUS

## 2019-10-19 NOTE — Discharge Instructions (Addendum)
Please seek medical attention for any high fevers, chest pain, shortness of breath, change in behavior, persistent vomiting, bloody stool or any other new or concerning symptoms.  

## 2019-10-19 NOTE — ED Notes (Signed)
E-signature not working at this time. Pt verbalized understanding of D/C instructions, prescriptions and follow up care with no further questions at this time. Pt in NAD and ambulatory at time of D/C.  

## 2019-10-19 NOTE — ED Triage Notes (Signed)
Pt presents to ED via POV with c/o RLQ abdominal pain x 2 weeks. Pt also c/o feeling nauseated when she eats. Pt presents A&O x4 NAD noted upon arrival to ED. Pt c/o soreness when she touches this area.

## 2019-10-19 NOTE — ED Provider Notes (Signed)
[facility] Emergency Department Provider Note   ____________________________________________   I have reviewed the triage vital signs and the nursing notes.   HISTORY  Chief Complaint Abdominal Pain   History limited by: Not Limited   HPI Latoya Freeman is a 78 y.o. female who presents to the emergency department today because of concern for right lower quadrant.  It has been going on for roughly 2 weeks. The patient will be sharp and radiate down to her pelvis on occasion. She has not noticed anything that makes the pain worse. The patient has not noticed any change in urination or defecation. Denies any associated fevers. Has not had any unusual ingestions recently. States her only abdominal surgery was to have her gall bladder removed.   Records reviewed. Per medical record review patient has a history of GERD, HTN.   Past Medical History:  Diagnosis Date  . Aneurysm ([place]) 1980  . Cancer ([place])   . GERD (gastroesophageal reflux disease)   . Hypertension 1980  . Hypothyroidism 1999  . Pneumonia   . Pulmonary embolism ([place])   . Shingles   . Wears dentures    full upper and lower    Patient Active Problem List   Diagnosis Date Noted  . Cyst of left ovary 04/04/2019  . Postmenopausal bleeding 04/04/2019  . Endometrial thickening on ultrasound 04/04/2019  . Pulmonary embolism ([place])   . GERD (gastroesophageal reflux disease) 08/07/2018  . Hypertension 08/07/2018  . Shingles 08/07/2018  . Thyroid disease 08/07/2018  . HCAP (healthcare-associated pneumonia) 07/27/2018    Past Surgical History:  Procedure Laterality Date  . BRAIN SURGERY     aneurysm  . BREAST CYST ASPIRATION Left 2005  . BREAST EXCISIONAL BIOPSY Left 2013   atypical ductal hyperplasia  . BREAST SURGERY  2013   LF Breast Wide EXC   . CHOLECYSTECTOMY  2004  . COLONOSCOPY    . COLONOSCOPY WITH PROPOFOL N/A 01/31/2018   Procedure: COLONOSCOPY WITH PROPOFOL;  Surgeon:  Lollie Sails, MD;  Location: [facility] ENDOSCOPY;  Service: Endoscopy;  Laterality: N/A;  . DILATION AND CURETTAGE OF UTERUS N/A 05/03/2019   Procedure: DILATATION AND CURETTAGE;  Surgeon: Gae Dry, MD;  Location: [facility] ORS;  Service: Gynecology;  Laterality: N/A;  . ESOPHAGOGASTRODUODENOSCOPY N/A 01/31/2018   Procedure: ESOPHAGOGASTRODUODENOSCOPY (EGD);  Surgeon: Lollie Sails, MD;  Location: [facility] ENDOSCOPY;  Service: Endoscopy;  Laterality: N/A;  . SHOULDER ARTHROSCOPY Left 07/25/2018   Procedure: SHOULDER MINI OPEN ROTATOR CUFF REPAIR  BICEPS TENDOSIS ARTHROSCOPIC DISTAL CLAVICLE EXCISION  SUBACROMIAL DECOMP;  Surgeon: Leim Fabry, MD;  Location: [place];  Service: Orthopedics;  Laterality: Left;  [place] & NEPHEW HEAD COIL ANCHOR FOOTPRINT ANCHOR QFIX ANCHOR    Prior to Admission medications   Medication Sig Start Date End Date Taking? Authorizing Provider  amLODipine (NORVASC) 5 MG tablet Take 5 mg by mouth daily.  02/07/13   [provider]  apixaban (ELIQUIS) 5 MG TABS tablet Take 5 mg by mouth 2 (two) times daily.    [provider]  citalopram (CELEXA) 20 MG tablet Take 20 mg by mouth daily. 04/23/19   [provider]  Cyanocobalamin (VITAMIN B-12 PO) Take 1 tablet by mouth daily.     [provider]  hydrochlorothiazide (HYDRODIURIL) 25 MG tablet Take 25 mg by mouth daily.  01/25/13   [provider]  levothyroxine (SYNTHROID, LEVOTHROID) 75 MCG tablet Take 75 mcg by mouth daily.  [provider]  losartan (COZAAR) 50 MG tablet Take 50 mg by mouth daily.     [provider]  omeprazole (PRILOSEC) 20 MG capsule Take 20 mg by mouth daily.    [provider]  potassium chloride (K-DUR,KLOR-CON) 10 MEQ tablet Take 20 mEq by mouth daily. 07/12/18   [provider]    Allergies Bee venom, Shellfish allergy, Tamoxifen, and Penicillins  Family History  Problem  Relation Age of Onset  . Breast cancer Neg Hx     Social History Social History   Tobacco Use  . Smoking status: Former Smoker    Packs/day: 1.50    Years: 20.00    Pack years: 30.00    Quit date: 1990    Years since quitting: 30.9  . Smokeless tobacco: Never Used  Substance Use Topics  . Alcohol use: No  . Drug use: No    Review of Systems Constitutional: No fever/chills Eyes: No visual changes. ENT: No sore throat. Cardiovascular: Denies chest pain. Respiratory: Denies shortness of breath. Gastrointestinal: Positive for right lower quadrant pain.  Genitourinary: Negative for dysuria. Musculoskeletal: Negative for back pain. Skin: Negative for rash. Neurological: Negative for headaches, focal weakness or numbness.  ____________________________________________   PHYSICAL EXAM:  VITAL SIGNS: ED Triage Vitals [10/19/19 1522]  Enc Vitals Group     BP 119/80     Pulse Rate 70     Resp 18     Temp 99.2 F (37.3 C)     Temp Source Oral     SpO2 100 %     Weight 192 lb (87.1 kg)     Height 5\' 1"  (1.549 m)     Head Circumference      Peak Flow      Pain Score 8   Constitutional: Alert and oriented.  Eyes: Conjunctivae are normal.  ENT      Head: Normocephalic and atraumatic.      Nose: No congestion/rhinnorhea.      Mouth/Throat: Mucous membranes are moist.      Neck: No stridor. Hematological/Lymphatic/Immunilogical: No cervical lymphadenopathy. Cardiovascular: Normal rate, regular rhythm.  No murmurs, rubs, or gallops.  Respiratory: Normal respiratory effort without tachypnea nor retractions. Breath sounds are clear and equal bilaterally. No wheezes/rales/rhonchi. Gastrointestinal: Soft and minimally tender in the right lower quadrant.  Genitourinary: Deferred Musculoskeletal: Normal range of motion in all extremities. No lower extremity edema. Neurologic:  Normal speech and language. No gross focal neurologic deficits are appreciated.  Skin:  Skin is  warm, dry and intact. No rash noted. Psychiatric: Mood and affect are normal. Speech and behavior are normal. Patient exhibits appropriate insight and judgment.  ____________________________________________    LABS (pertinent positives/negatives)  Lipase 47 CMP wnl except glu 109, cr 1.61 UA clear, 0-5 rbc and wbc, rare bacteria CBC wbc 8.9, hgb 12.3, plt 308 ____________________________________________   EKG  None  ____________________________________________    RADIOLOGY  None  ____________________________________________   PROCEDURES  Procedures  ____________________________________________   INITIAL IMPRESSION / ASSESSMENT AND PLAN / ED COURSE  Pertinent labs & imaging results that were available during my care of the patient were reviewed by me and considered in my medical decision making (see chart for details).   Patient presents to the emergency department because of concern for abdominal pain. Blood work without concerning findings at this time. Did obtain a ct scan given concern for RLQ pain. This did not show any concerning findings. Likewise urine was unrevealing. Given negative work up  and imaging I do think it is reasonable for patient to be discharged home. Discussed findings and importance of PCP follow up with patient.   ____________________________________________   FINAL CLINICAL IMPRESSION(S) / ED DIAGNOSES  Final diagnoses:  Abdominal pain, unspecified abdominal location     Note: This dictation was prepared with Dragon dictation. Any transcriptional errors that result from this process are unintentional     Nance Pear, MD 10/19/19 2103

## 2019-11-06 DIAGNOSIS — I10 Essential (primary) hypertension: Secondary | ICD-10-CM | POA: Diagnosis not present

## 2019-11-06 DIAGNOSIS — E039 Hypothyroidism, unspecified: Secondary | ICD-10-CM | POA: Diagnosis not present

## 2019-11-12 DIAGNOSIS — Z79899 Other long term (current) drug therapy: Secondary | ICD-10-CM | POA: Diagnosis not present

## 2019-11-12 DIAGNOSIS — Z0000 Encounter for general adult medical examination without abnormal findings: Secondary | ICD-10-CM | POA: Diagnosis not present

## 2019-11-12 DIAGNOSIS — E079 Disorder of thyroid, unspecified: Secondary | ICD-10-CM | POA: Diagnosis not present

## 2019-11-12 DIAGNOSIS — R109 Unspecified abdominal pain: Secondary | ICD-10-CM | POA: Diagnosis not present

## 2019-11-12 DIAGNOSIS — N183 Chronic kidney disease, stage 3 unspecified: Secondary | ICD-10-CM | POA: Diagnosis not present

## 2019-11-12 DIAGNOSIS — I129 Hypertensive chronic kidney disease with stage 1 through stage 4 chronic kidney disease, or unspecified chronic kidney disease: Secondary | ICD-10-CM | POA: Diagnosis not present

## 2019-11-12 DIAGNOSIS — R198 Other specified symptoms and signs involving the digestive system and abdomen: Secondary | ICD-10-CM | POA: Diagnosis not present

## 2019-12-18 ENCOUNTER — Other Ambulatory Visit: Payer: Self-pay | Admitting: Nephrology

## 2019-12-18 DIAGNOSIS — E876 Hypokalemia: Secondary | ICD-10-CM | POA: Diagnosis not present

## 2019-12-18 DIAGNOSIS — J449 Chronic obstructive pulmonary disease, unspecified: Secondary | ICD-10-CM | POA: Diagnosis not present

## 2019-12-18 DIAGNOSIS — I129 Hypertensive chronic kidney disease with stage 1 through stage 4 chronic kidney disease, or unspecified chronic kidney disease: Secondary | ICD-10-CM | POA: Diagnosis not present

## 2019-12-18 DIAGNOSIS — N1832 Chronic kidney disease, stage 3b: Secondary | ICD-10-CM | POA: Diagnosis not present

## 2019-12-18 DIAGNOSIS — N183 Chronic kidney disease, stage 3 unspecified: Secondary | ICD-10-CM | POA: Insufficient documentation

## 2019-12-25 ENCOUNTER — Other Ambulatory Visit: Payer: Self-pay | Admitting: Gastroenterology

## 2019-12-25 DIAGNOSIS — R1013 Epigastric pain: Secondary | ICD-10-CM

## 2019-12-26 ENCOUNTER — Ambulatory Visit
Admission: RE | Admit: 2019-12-26 | Discharge: 2019-12-26 | Disposition: A | Discharge status: Home / Self Care | Payer: Medicare HMO | Source: Ambulatory Visit | Attending: Nephrology | Admitting: Nephrology

## 2019-12-26 ENCOUNTER — Other Ambulatory Visit: Payer: Self-pay

## 2019-12-26 DIAGNOSIS — N189 Chronic kidney disease, unspecified: Secondary | ICD-10-CM | POA: Diagnosis not present

## 2019-12-26 DIAGNOSIS — N183 Chronic kidney disease, stage 3 unspecified: Secondary | ICD-10-CM | POA: Diagnosis not present

## 2019-12-26 DIAGNOSIS — N1832 Chronic kidney disease, stage 3b: Secondary | ICD-10-CM | POA: Diagnosis not present

## 2019-12-26 DIAGNOSIS — I129 Hypertensive chronic kidney disease with stage 1 through stage 4 chronic kidney disease, or unspecified chronic kidney disease: Secondary | ICD-10-CM

## 2019-12-26 DIAGNOSIS — E876 Hypokalemia: Secondary | ICD-10-CM | POA: Diagnosis not present

## 2019-12-26 DIAGNOSIS — N281 Cyst of kidney, acquired: Secondary | ICD-10-CM | POA: Diagnosis not present

## 2020-01-04 ENCOUNTER — Other Ambulatory Visit: Payer: Self-pay | Admitting: Nephrology

## 2020-01-04 DIAGNOSIS — N289 Disorder of kidney and ureter, unspecified: Secondary | ICD-10-CM

## 2020-01-04 DIAGNOSIS — N2889 Other specified disorders of kidney and ureter: Secondary | ICD-10-CM

## 2020-01-09 ENCOUNTER — Encounter
Admission: RE | Admit: 2020-01-09 | Discharge: 2020-01-09 | Disposition: A | Discharge status: Home / Self Care | Payer: Medicare HMO | Source: Ambulatory Visit | Attending: Gastroenterology | Admitting: Gastroenterology

## 2020-01-09 ENCOUNTER — Other Ambulatory Visit: Payer: Self-pay

## 2020-01-09 DIAGNOSIS — R1013 Epigastric pain: Secondary | ICD-10-CM | POA: Insufficient documentation

## 2020-01-09 DIAGNOSIS — R109 Unspecified abdominal pain: Secondary | ICD-10-CM | POA: Diagnosis not present

## 2020-01-09 MED ORDER — TECHNETIUM TC 99M SULFUR COLLOID
2.0000 | Freq: Once | INTRAVENOUS | Status: AC | PRN
  Administered 2020-01-09: 2.551 via ORAL

## 2020-01-21 DIAGNOSIS — E876 Hypokalemia: Secondary | ICD-10-CM | POA: Diagnosis not present

## 2020-01-21 DIAGNOSIS — N2889 Other specified disorders of kidney and ureter: Secondary | ICD-10-CM | POA: Diagnosis not present

## 2020-01-21 DIAGNOSIS — N1832 Chronic kidney disease, stage 3b: Secondary | ICD-10-CM | POA: Diagnosis not present

## 2020-01-21 DIAGNOSIS — I129 Hypertensive chronic kidney disease with stage 1 through stage 4 chronic kidney disease, or unspecified chronic kidney disease: Secondary | ICD-10-CM | POA: Diagnosis not present

## 2020-01-29 DIAGNOSIS — M9903 Segmental and somatic dysfunction of lumbar region: Secondary | ICD-10-CM | POA: Diagnosis not present

## 2020-01-29 DIAGNOSIS — M9902 Segmental and somatic dysfunction of thoracic region: Secondary | ICD-10-CM | POA: Diagnosis not present

## 2020-01-29 DIAGNOSIS — M62830 Muscle spasm of back: Secondary | ICD-10-CM | POA: Diagnosis not present

## 2020-01-29 DIAGNOSIS — M5134 Other intervertebral disc degeneration, thoracic region: Secondary | ICD-10-CM | POA: Diagnosis not present

## 2020-02-23 DIAGNOSIS — R9341 Abnormal radiologic findings on diagnostic imaging of renal pelvis, ureter, or bladder: Secondary | ICD-10-CM | POA: Diagnosis not present

## 2020-02-23 DIAGNOSIS — Z7982 Long term (current) use of aspirin: Secondary | ICD-10-CM | POA: Diagnosis not present

## 2020-02-23 DIAGNOSIS — E079 Disorder of thyroid, unspecified: Secondary | ICD-10-CM | POA: Diagnosis not present

## 2020-02-23 DIAGNOSIS — N83202 Unspecified ovarian cyst, left side: Secondary | ICD-10-CM | POA: Diagnosis not present

## 2020-02-23 DIAGNOSIS — I671 Cerebral aneurysm, nonruptured: Secondary | ICD-10-CM | POA: Insufficient documentation

## 2020-02-23 DIAGNOSIS — N3090 Cystitis, unspecified without hematuria: Secondary | ICD-10-CM | POA: Diagnosis not present

## 2020-02-23 DIAGNOSIS — K219 Gastro-esophageal reflux disease without esophagitis: Secondary | ICD-10-CM | POA: Diagnosis not present

## 2020-02-23 DIAGNOSIS — Z79899 Other long term (current) drug therapy: Secondary | ICD-10-CM | POA: Diagnosis not present

## 2020-02-23 DIAGNOSIS — K649 Unspecified hemorrhoids: Secondary | ICD-10-CM | POA: Insufficient documentation

## 2020-02-23 DIAGNOSIS — Z87891 Personal history of nicotine dependence: Secondary | ICD-10-CM | POA: Diagnosis not present

## 2020-02-23 DIAGNOSIS — N63 Unspecified lump in unspecified breast: Secondary | ICD-10-CM | POA: Insufficient documentation

## 2020-02-23 DIAGNOSIS — N2889 Other specified disorders of kidney and ureter: Secondary | ICD-10-CM | POA: Insufficient documentation

## 2020-02-23 DIAGNOSIS — N951 Menopausal and female climacteric states: Secondary | ICD-10-CM | POA: Insufficient documentation

## 2020-02-23 DIAGNOSIS — Z880 Allergy status to penicillin: Secondary | ICD-10-CM | POA: Diagnosis not present

## 2020-02-23 DIAGNOSIS — M778 Other enthesopathies, not elsewhere classified: Secondary | ICD-10-CM | POA: Insufficient documentation

## 2020-02-23 DIAGNOSIS — I10 Essential (primary) hypertension: Secondary | ICD-10-CM | POA: Diagnosis not present

## 2020-02-23 DIAGNOSIS — R1031 Right lower quadrant pain: Secondary | ICD-10-CM | POA: Diagnosis not present

## 2020-03-06 DIAGNOSIS — E079 Disorder of thyroid, unspecified: Secondary | ICD-10-CM | POA: Diagnosis not present

## 2020-03-06 DIAGNOSIS — N83202 Unspecified ovarian cyst, left side: Secondary | ICD-10-CM | POA: Diagnosis not present

## 2020-03-06 DIAGNOSIS — E669 Obesity, unspecified: Secondary | ICD-10-CM | POA: Diagnosis not present

## 2020-03-06 DIAGNOSIS — R1031 Right lower quadrant pain: Secondary | ICD-10-CM | POA: Diagnosis not present

## 2020-03-06 DIAGNOSIS — K5900 Constipation, unspecified: Secondary | ICD-10-CM | POA: Diagnosis not present

## 2020-03-08 ENCOUNTER — Emergency Department: Payer: Medicare HMO

## 2020-03-08 ENCOUNTER — Emergency Department
Admission: EM | Admit: 2020-03-08 | Discharge: 2020-03-09 | Disposition: A | Discharge status: Home / Self Care | Payer: Medicare HMO | Attending: Emergency Medicine | Admitting: Emergency Medicine

## 2020-03-08 ENCOUNTER — Other Ambulatory Visit: Payer: Self-pay

## 2020-03-08 DIAGNOSIS — K5900 Constipation, unspecified: Secondary | ICD-10-CM | POA: Diagnosis present

## 2020-03-08 DIAGNOSIS — Z87891 Personal history of nicotine dependence: Secondary | ICD-10-CM | POA: Diagnosis not present

## 2020-03-08 DIAGNOSIS — Z7901 Long term (current) use of anticoagulants: Secondary | ICD-10-CM | POA: Diagnosis not present

## 2020-03-08 DIAGNOSIS — R1084 Generalized abdominal pain: Secondary | ICD-10-CM

## 2020-03-08 DIAGNOSIS — E039 Hypothyroidism, unspecified: Secondary | ICD-10-CM | POA: Insufficient documentation

## 2020-03-08 DIAGNOSIS — N83202 Unspecified ovarian cyst, left side: Secondary | ICD-10-CM | POA: Diagnosis not present

## 2020-03-08 DIAGNOSIS — I10 Essential (primary) hypertension: Secondary | ICD-10-CM | POA: Insufficient documentation

## 2020-03-08 DIAGNOSIS — Z79899 Other long term (current) drug therapy: Secondary | ICD-10-CM | POA: Insufficient documentation

## 2020-03-08 DIAGNOSIS — Z859 Personal history of malignant neoplasm, unspecified: Secondary | ICD-10-CM | POA: Diagnosis not present

## 2020-03-08 LAB — URINALYSIS, COMPLETE (UACMP) WITH MICROSCOPIC
Bacteria, UA: NONE SEEN
Bilirubin Urine: NEGATIVE
Glucose, UA: NEGATIVE mg/dL
Hgb urine dipstick: NEGATIVE
Ketones, ur: NEGATIVE mg/dL
Leukocytes,Ua: NEGATIVE
Nitrite: NEGATIVE
Protein, ur: NEGATIVE mg/dL
Specific Gravity, Urine: 1.005 (ref 1.005–1.030)
Squamous Epithelial / HPF: NONE SEEN (ref 0–5)
pH: 6 (ref 5.0–8.0)

## 2020-03-08 LAB — COMPREHENSIVE METABOLIC PANEL
ALT: 14 U/L (ref 0–44)
AST: 19 U/L (ref 15–41)
Albumin: 4.1 g/dL (ref 3.5–5.0)
Alkaline Phosphatase: 58 U/L (ref 38–126)
Anion gap: 9 (ref 5–15)
BUN: 17 mg/dL (ref 8–23)
CO2: 28 mmol/L (ref 22–32)
Calcium: 10.1 mg/dL (ref 8.9–10.3)
Chloride: 103 mmol/L (ref 98–111)
Creatinine, Ser: 1.54 mg/dL — ABNORMAL HIGH (ref 0.44–1.00)
GFR calc Af Amer: 37 mL/min — ABNORMAL LOW (ref >60)
GFR calc non Af Amer: 32 mL/min — ABNORMAL LOW (ref >60)
Glucose, Bld: 87 mg/dL (ref 70–99)
Potassium: 3.8 mmol/L (ref 3.5–5.1)
Sodium: 140 mmol/L (ref 135–145)
Total Bilirubin: 0.7 mg/dL (ref 0.3–1.2)
Total Protein: 8 g/dL (ref 6.5–8.1)

## 2020-03-08 LAB — CBC
HCT: 33 % — ABNORMAL LOW (ref 36.0–46.0)
Hemoglobin: 10.6 g/dL — ABNORMAL LOW (ref 12.0–15.0)
MCH: 26.8 pg (ref 26.0–34.0)
MCHC: 32.1 g/dL (ref 30.0–36.0)
MCV: 83.5 fL (ref 80.0–100.0)
Platelets: 311 10*3/uL (ref 150–400)
RBC: 3.95 MIL/uL (ref 3.87–5.11)
RDW: 13.7 % (ref 11.5–15.5)
WBC: 8.4 10*3/uL (ref 4.0–10.5)
nRBC: 0 % (ref 0.0–0.2)

## 2020-03-08 LAB — LIPASE, BLOOD: Lipase: 50 U/L (ref 11–51)

## 2020-03-08 MED ORDER — MAGNESIUM CITRATE PO SOLN
1.0000 | Freq: Once | ORAL | Status: AC
  Administered 2020-03-09: 1 via ORAL
  Filled 2020-03-08: qty 296

## 2020-03-08 MED ORDER — DOCUSATE SODIUM 50 MG/5ML PO LIQD
100.0000 mg | Freq: Once | ORAL | Status: AC
  Administered 2020-03-09: 100 mg via ORAL
  Filled 2020-03-08: qty 10

## 2020-03-08 MED ORDER — SORBITOL 70 % SOLN
960.0000 mL | TOPICAL_OIL | Freq: Once | ORAL | Status: DC
  Filled 2020-03-08: qty 473

## 2020-03-08 MED ORDER — IOHEXOL 9 MG/ML PO SOLN
500.0000 mL | Freq: Two times a day (BID) | ORAL | Status: DC | PRN
  Administered 2020-03-08: 18:00:00 500 mL via ORAL

## 2020-03-08 MED ORDER — LACTULOSE 10 GM/15ML PO SOLN
30.0000 g | Freq: Once | ORAL | Status: AC
  Administered 2020-03-09: 30 g via ORAL
  Filled 2020-03-08: qty 60

## 2020-03-08 MED ORDER — LACTULOSE 10 GM/15ML PO SOLN: 10.0000 g | Freq: Once | ORAL | Status: DC

## 2020-03-08 MED ORDER — ONDANSETRON HCL 4 MG/2ML IJ SOLN
4.0000 mg | Freq: Once | INTRAMUSCULAR | Status: AC
  Administered 2020-03-08: 18:00:00 4 mg via INTRAVENOUS
  Filled 2020-03-08: qty 2

## 2020-03-08 MED ORDER — IOHEXOL 300 MG/ML  SOLN
75.0000 mL | Freq: Once | INTRAMUSCULAR | Status: AC | PRN
  Administered 2020-03-08: 22:00:00 75 mL via INTRAVENOUS

## 2020-03-08 MED ORDER — SODIUM CHLORIDE 0.9 % IV BOLUS
1000.0000 mL | Freq: Once | INTRAVENOUS | Status: AC
  Administered 2020-03-08: 1000 mL via INTRAVENOUS

## 2020-03-08 NOTE — ED Triage Notes (Signed)
Pt arrives to ED c/o of "bowel blockage." states heartburn feelings but denies N&V&D. States BM 3 days ago. A&O, ambulatory.

## 2020-03-08 NOTE — ED Notes (Signed)
Was able to collect lav tube.  Still needs green

## 2020-03-08 NOTE — ED Notes (Signed)
CT called for pt having new IV, pt leaving for CT  Daughter at bedside

## 2020-03-08 NOTE — ED Notes (Signed)
Pt states hx of bowel obstruction since Nov. 2020. Pt sent from [facility] for further evaluation.

## 2020-03-08 NOTE — ED Provider Notes (Signed)
[facility] Emergency Department Provider Note  ____________________________________________  Time seen: Approximately 5:57 PM  I have reviewed the triage vital signs and the nursing notes.   HISTORY  Chief Complaint Abdominal Pain    HPI Latoya Freeman is a 79 y.o. female who presents the emergency department for evaluation of intestinal "blockage."  According to the patient, she had been having some abdominal pain, difficulties with bowel movements and had seen GI.  GI performed x-ray and informed the patient that she had a "blockage."  Patient is unsure what level this blockage occurs whether this is small bowel or colon.  Patient states that they had placed her on fluids, encouraged her to use MiraLAX and this was not alleviating her constipation.  GI called her today and advised her to present to the emergency department for further evaluation.  No URI symptoms.  No fevers or chills.  Patient states that the last bowel movement was 3 days ago.  No emesis.         Past Medical History:  Diagnosis Date  . Aneurysm ([place]) 1980  . Cancer ([place])   . GERD (gastroesophageal reflux disease)   . Hypertension 1980  . Hypothyroidism 1999  . Pneumonia   . Pulmonary embolism ([place])   . Shingles   . Wears dentures    full upper and lower    Patient Active Problem List   Diagnosis Date Noted  . Cyst of left ovary 04/04/2019  . Postmenopausal bleeding 04/04/2019  . Endometrial thickening on ultrasound 04/04/2019  . Pulmonary embolism ([place])   . GERD (gastroesophageal reflux disease) 08/07/2018  . Hypertension 08/07/2018  . Shingles 08/07/2018  . Thyroid disease 08/07/2018  . HCAP (healthcare-associated pneumonia) 07/27/2018    Past Surgical History:  Procedure Laterality Date  . BRAIN SURGERY     aneurysm  . BREAST CYST ASPIRATION Left 2005  . BREAST EXCISIONAL BIOPSY Left 2013   atypical ductal hyperplasia  . BREAST SURGERY  2013   LF Breast Wide EXC    . CHOLECYSTECTOMY  2004  . COLONOSCOPY    . COLONOSCOPY WITH PROPOFOL N/A 01/31/2018   Procedure: COLONOSCOPY WITH PROPOFOL;  Surgeon: Lollie Sails, MD;  Location: [facility] ENDOSCOPY;  Service: Endoscopy;  Laterality: N/A;  . DILATION AND CURETTAGE OF UTERUS N/A 05/03/2019   Procedure: DILATATION AND CURETTAGE;  Surgeon: Gae Dry, MD;  Location: [facility] ORS;  Service: Gynecology;  Laterality: N/A;  . ESOPHAGOGASTRODUODENOSCOPY N/A 01/31/2018   Procedure: ESOPHAGOGASTRODUODENOSCOPY (EGD);  Surgeon: Lollie Sails, MD;  Location: [facility] ENDOSCOPY;  Service: Endoscopy;  Laterality: N/A;  . SHOULDER ARTHROSCOPY Left 07/25/2018   Procedure: SHOULDER MINI OPEN ROTATOR CUFF REPAIR  BICEPS TENDOSIS ARTHROSCOPIC DISTAL CLAVICLE EXCISION  SUBACROMIAL DECOMP;  Surgeon: Leim Fabry, MD;  Location: [place];  Service: Orthopedics;  Laterality: Left;  [place] & NEPHEW HEAD COIL ANCHOR FOOTPRINT ANCHOR QFIX ANCHOR    Prior to Admission medications   Medication Sig Start Date End Date Taking? Authorizing Provider  amLODipine (NORVASC) 5 MG tablet Take 5 mg by mouth daily.  02/07/13   [provider]  apixaban (ELIQUIS) 5 MG TABS tablet Take 5 mg by mouth 2 (two) times daily.    [provider]  citalopram (CELEXA) 20 MG tablet Take 20 mg by mouth daily. 04/23/19   [provider]  Cyanocobalamin (VITAMIN B-12 PO) Take 1 tablet by mouth daily.     [provider]  hydrochlorothiazide (HYDRODIURIL) 25  MG tablet Take 25 mg by mouth daily.  01/25/13   [provider]  levothyroxine (SYNTHROID, LEVOTHROID) 75 MCG tablet Take 75 mcg by mouth daily.    [provider]  losartan (COZAAR) 50 MG tablet Take 50 mg by mouth daily.     [provider]  omeprazole (PRILOSEC) 20 MG capsule Take 20 mg by mouth daily.    [provider]  potassium chloride (K-DUR,KLOR-CON) 10 MEQ tablet Take 20 mEq by mouth daily.  07/12/18   [provider]    Allergies Bee venom, Shellfish allergy, Tamoxifen, and Penicillins  Family History  Problem Relation Age of Onset  . Breast cancer Neg Hx     Social History Social History   Tobacco Use  . Smoking status: Former Smoker    Packs/day: 1.50    Years: 20.00    Pack years: 30.00    Quit date: 1990    Years since quitting: 31.3  . Smokeless tobacco: Never Used  Substance Use Topics  . Alcohol use: No  . Drug use: No     Review of Systems  Constitutional: No fever/chills Eyes: No visual changes. No discharge ENT: No upper respiratory complaints. Cardiovascular: no chest pain. Respiratory: no cough. No SOB. Gastrointestinal: Sent by GI for evaluation for "blockage."  No abdominal pain.  No nausea, no vomiting.  No diarrhea.  No constipation. Genitourinary: Negative for dysuria. No hematuria Musculoskeletal: Negative for musculoskeletal pain. Skin: Negative for rash, abrasions, lacerations, ecchymosis. Neurological: Negative for headaches, focal weakness or numbness. 10-point ROS otherwise negative.  ____________________________________________   PHYSICAL EXAM:  VITAL SIGNS: ED Triage Vitals  Enc Vitals Group     BP 03/08/20 1341 (!) 132/54     Pulse Rate 03/08/20 1341 61     Resp 03/08/20 1341 18     Temp 03/08/20 1341 98.3 F (36.8 C)     Temp Source 03/08/20 1341 Oral     SpO2 03/08/20 1341 97 %     Weight 03/08/20 1343 202 lb (91.6 kg)     Height 03/08/20 1343 5' (1.524 m)     Head Circumference --      Peak Flow --      Pain Score 03/08/20 1343 7     Pain Loc --      Pain Edu? --      Excl. in [place]? --      Constitutional: Alert and oriented. Well appearing and in no acute distress. Eyes: Conjunctivae are normal. PERRL. EOMI. Head: Atraumatic. ENT:      Ears:       Nose: No congestion/rhinnorhea.      Mouth/Throat: Mucous membranes are moist.  Neck: No stridor.    Cardiovascular: Normal rate, regular rhythm.  Normal S1 and S2.  Good peripheral circulation. Respiratory: Normal respiratory effort without tachypnea or retractions. Lungs CTAB. Good air entry to the bases with no decreased or absent breath sounds. Gastrointestinal: Bowel sounds 4 quadrants but somewhat decreased in the left lower quadrant.. Soft and nontender to palpation. No guarding or rigidity. No palpable masses. No distention. No CVA tenderness. Musculoskeletal: Full range of motion to all extremities. No gross deformities appreciated. Neurologic:  Normal speech and language. No gross focal neurologic deficits are appreciated.  Skin:  Skin is warm, dry and intact. No rash noted. Psychiatric: Mood and affect are normal. Speech and behavior are normal. Patient exhibits appropriate insight and judgement.   ____________________________________________   LABS (all labs ordered are listed, but only  abnormal results are displayed)  Labs Reviewed  COMPREHENSIVE METABOLIC PANEL - Abnormal; Notable for the following components:      Result Value   Creatinine, Ser 1.54 (*)    GFR calc non Af Amer 32 (*)    GFR calc Af Amer 37 (*)    All other components within normal limits  CBC - Abnormal; Notable for the following components:   Hemoglobin 10.6 (*)    HCT 33.0 (*)    All other components within normal limits  URINALYSIS, COMPLETE (UACMP) WITH MICROSCOPIC - Abnormal; Notable for the following components:   Color, Urine STRAW (*)    APPearance CLEAR (*)    All other components within normal limits  LIPASE, BLOOD   ____________________________________________  EKG   ____________________________________________  RADIOLOGY   No results found.  ____________________________________________    PROCEDURES  Procedure(s) performed:    Procedures    Medications  iohexol (OMNIPAQUE) 9 MG/ML oral solution 500 mL (500 mLs Oral Contrast Given 03/08/20 1822)  sodium chloride 0.9 % bolus 1,000 mL (1,000 mLs Intravenous New  Bag/Given 03/08/20 1819)  ondansetron (ZOFRAN) injection 4 mg (4 mg Intravenous Given 03/08/20 1820)  iohexol (OMNIPAQUE) 300 MG/ML solution 75 mL (75 mLs Intravenous Contrast Given 03/08/20 1932)     ____________________________________________   INITIAL IMPRESSION / ASSESSMENT AND PLAN / ED COURSE  Pertinent labs & imaging results that were available during my care of the patient were reviewed by me and considered in my medical decision making (see chart for details).  Review of the [place] CSRS was performed in accordance of the [place] prior to dispensing any controlled drugs.           Patient presented to the emergency department complaining of "blockage".  Patient states that she has had constipation with no bowel movement x3 days.  This is abnormal for her.  She had previously seen GI, states that they had diagnosed her with a "blockage."  Patient is unsure of the location of this blockage.  She denies any emesis.  She states that initially GI wanted to use fluids, laxative to see if this would improve her symptoms.  Patient states that she has had no improvement in GI recommended presenting to the emergency department for evaluation.  At this time, labs have resulted but imaging is still pending.  Pending final diagnosis and disposition, patient care will be transferred to attending provider, Dr. Darl Householder. Dr. Darl Householder, has been participating in the patient's care and is aware of pending results.  Final diagnosis and disposition will be provided by the attending provider.    This chart was dictated using voice recognition software/Dragon. Despite best efforts to proofread, errors can occur which can change the meaning. Any change was purely unintentional.    Darletta Moll, PA-C 03/08/20 2038    Drenda Freeze, MD 03/09/20 1539

## 2020-03-08 NOTE — ED Notes (Signed)
EDP rectal examine; pt and daughter reports 5 months of varying degrees of constipation and various treatments  Pt moved to room 19 per EDP

## 2020-03-08 NOTE — ED Notes (Signed)
ED Provider at bedside.  IV fluids restarted to new right IV

## 2020-03-09 DIAGNOSIS — R1084 Generalized abdominal pain: Secondary | ICD-10-CM | POA: Diagnosis not present

## 2020-03-09 MED ORDER — ONDANSETRON 4 MG PO TBDP: 4.0000 mg | ORAL_TABLET | Freq: Three times a day (TID) | ORAL | 0 refills | Status: DC | PRN

## 2020-03-09 MED ORDER — ONDANSETRON HCL 4 MG/2ML IJ SOLN
4.0000 mg | Freq: Once | INTRAMUSCULAR | Status: AC
  Administered 2020-03-09: 02:00:00 4 mg via INTRAVENOUS
  Filled 2020-03-09: qty 2

## 2020-03-09 MED ORDER — LACTULOSE 10 GM/15ML PO SOLN: 20.0000 g | Freq: Every day | ORAL | 0 refills | Status: DC | PRN

## 2020-03-09 NOTE — ED Notes (Addendum)
Approx 1500 mls enema given, pt moved to toilet after left lying recumbent position  Family at bedside

## 2020-03-09 NOTE — ED Notes (Signed)
Nausea improved, pt resting, warm blanket given to daughter: reports pt had additional BM

## 2020-03-09 NOTE — ED Notes (Signed)
ED Provider at bedside.  EDP notified of pt's nausea

## 2020-03-09 NOTE — Discharge Instructions (Signed)
1.  You may take lactulose as needed for bowel movements. 2.  You may take Zofran as needed for nausea. 3.  I recommend the following over-the-counter medications for daily bowel regimen: MiraLAX Stool softener such as Colace Fiber Fluids 4.  Return to the ER for worsening symptoms, persistent vomiting, difficulty breathing or other concerns

## 2020-03-09 NOTE — ED Provider Notes (Signed)
-----------------------------------------   1:32 AM on 03/09/2020 -----------------------------------------  Patient having satisfactory large bowel movements.  Feeling nauseous; will administer IV Zofran.   ----------------------------------------- 3:07 AM on 03/09/2020 -----------------------------------------  Patient feeling much better and is eager for discharge home.  Will discharge with prescription for lactulose and Zofran to use as needed.  Strict return precautions given.  Patient and daughter verbalized understanding agree with plan of care.   Paulette Blanch, MD 03/09/20 [id number]

## 2020-03-09 NOTE — ED Notes (Signed)
Peripheral IV discontinued. Catheter intact. No signs of infiltration or redness. Gauze applied to IV site.   Discharge instructions reviewed with patient. Questions fielded by this RN. Patient verbalizes understanding of instructions. Patient discharged home in stable condition per sung. No acute distress noted at time of discharge.   Pt dressed in fresh underwear and blue paper scrub pants and wheeled to family vehicle, NAD noted

## 2020-03-13 DIAGNOSIS — E079 Disorder of thyroid, unspecified: Secondary | ICD-10-CM | POA: Diagnosis not present

## 2020-03-14 ENCOUNTER — Ambulatory Visit (INDEPENDENT_AMBULATORY_CARE_PROVIDER_SITE_OTHER): Payer: Medicare HMO | Admitting: Obstetrics & Gynecology

## 2020-03-14 ENCOUNTER — Encounter: Payer: Self-pay | Admitting: Obstetrics & Gynecology

## 2020-03-14 ENCOUNTER — Other Ambulatory Visit: Payer: Self-pay

## 2020-03-14 VITALS — BP 110/70 | Ht 60.0 in | Wt 202.0 lb

## 2020-03-14 DIAGNOSIS — N83202 Unspecified ovarian cyst, left side: Secondary | ICD-10-CM | POA: Diagnosis not present

## 2020-03-14 NOTE — Progress Notes (Signed)
HPI: Patient is a 79 y.o. G3P3 who LMP was No LMP recorded. Patient is postmenopausal., presents today for a problem visit.  She complains of recent findings of Left ovarion cyst by CT - Pelvis.   This was done due to other reasons in the ER. This is a known cyst she has had for many years.  Last [place] 03/2019, last CT 09/2019, last CA125 (normal) 03/2019.  Pt has had symptoms of none.  Denies pain or bloating.  Findings by CT: Reproductive: Anteverted uterus. Stable 2.9 by 1.6 cm cystic focus in the left adnexa, unchanged from comparison exam. No right adnexal lesion.  PMHx: She  has a past medical history of Aneurysm ([place]) (1980), Cancer ([place]), GERD (gastroesophageal reflux disease), Hypertension (1980), Hypothyroidism (1999), Pneumonia, Pulmonary embolism ([place]), Shingles, and Wears dentures. Also,  has a past surgical history that includes Breast surgery (2013); Cholecystectomy (2004); Breast excisional biopsy (Left, 2013); Breast cyst aspiration (Left, 2005); Colonoscopy; Esophagogastroduodenoscopy (N/A, 01/31/2018); Colonoscopy with propofol (N/A, 01/31/2018); Shoulder arthroscopy (Left, 07/25/2018); Brain surgery; and Dilation and curettage of uterus (N/A, 05/03/2019)., family history is not on file.,  reports that she quit smoking about 31 years ago. She has a 30.00 pack-year smoking history. She has never used smokeless tobacco. She reports that she does not drink alcohol or use drugs.  She has a current medication list which includes the following prescription(s): amlodipine, apixaban, citalopram, cyanocobalamin, hydrochlorothiazide, lactulose, levothyroxine, losartan, omeprazole, ondansetron, and potassium chloride. Also, is allergic to bee venom; shellfish allergy; tamoxifen; and penicillins.  Review of Systems  Constitutional: Positive for malaise/fatigue. Negative for chills and fever.  HENT: Negative for congestion, sinus pain and sore throat.   Eyes: Negative for blurred vision and pain.    Respiratory: Negative for cough and wheezing.   Cardiovascular: Negative for chest pain and leg swelling.  Gastrointestinal: Positive for constipation. Negative for abdominal pain, diarrhea, heartburn, nausea and vomiting.  Genitourinary: Negative for dysuria, frequency, hematuria and urgency.  Musculoskeletal: Positive for joint pain. Negative for back pain, myalgias and neck pain.  Skin: Positive for itching. Negative for rash.  Neurological: Negative for dizziness, tremors and weakness.  Endo/Heme/Allergies: Bruises/bleeds easily.  Psychiatric/Behavioral: Negative for depression. The patient is not nervous/anxious and does not have insomnia.     Objective: BP 110/70   Ht 5' (1.524 m)   Wt 202 lb (91.6 kg)   BMI 39.45 kg/m  Physical Exam Constitutional:      General: She is not in acute distress.    Appearance: She is well-developed.  Musculoskeletal:        General: Normal range of motion.  Neurological:     Mental Status: She is alert and oriented to person, place, and time.  Skin:    General: Skin is warm and dry.  Vitals reviewed.     ASSESSMENT/PLAN:    Problem List Items Addressed This Visit      Endocrine   Cyst of left ovary - Primary   Relevant Orders   US PELVIC COMPLETE WITH TRANSVAGINAL    Stable, known cyst of left ovary, prior testing neg/nornal for cancer Plan [place] f/u in 3 mos No sx's, monitor for symptoms Pt reassured and agrees with conservative plan Option for surgery and removal discussed again today  A total of 25 minutes were spent face-to-face with the patient as well as preparation, review, communication, and documentation during this encounter.   Barnett Applebaum, MD, [facility], [facility] 03/14/2020  8:34 AM

## 2020-03-20 DIAGNOSIS — Z86718 Personal history of other venous thrombosis and embolism: Secondary | ICD-10-CM | POA: Diagnosis not present

## 2020-03-20 DIAGNOSIS — R8290 Unspecified abnormal findings in urine: Secondary | ICD-10-CM | POA: Diagnosis not present

## 2020-03-20 DIAGNOSIS — Z7901 Long term (current) use of anticoagulants: Secondary | ICD-10-CM | POA: Diagnosis not present

## 2020-03-20 DIAGNOSIS — R1031 Right lower quadrant pain: Secondary | ICD-10-CM | POA: Diagnosis not present

## 2020-03-20 DIAGNOSIS — Z87891 Personal history of nicotine dependence: Secondary | ICD-10-CM | POA: Diagnosis not present

## 2020-03-25 DIAGNOSIS — M62830 Muscle spasm of back: Secondary | ICD-10-CM | POA: Diagnosis not present

## 2020-03-25 DIAGNOSIS — M9903 Segmental and somatic dysfunction of lumbar region: Secondary | ICD-10-CM | POA: Diagnosis not present

## 2020-03-25 DIAGNOSIS — M9902 Segmental and somatic dysfunction of thoracic region: Secondary | ICD-10-CM | POA: Diagnosis not present

## 2020-03-25 DIAGNOSIS — M5134 Other intervertebral disc degeneration, thoracic region: Secondary | ICD-10-CM | POA: Diagnosis not present

## 2020-03-31 DIAGNOSIS — E079 Disorder of thyroid, unspecified: Secondary | ICD-10-CM | POA: Diagnosis not present

## 2020-03-31 DIAGNOSIS — G8929 Other chronic pain: Secondary | ICD-10-CM | POA: Diagnosis not present

## 2020-03-31 DIAGNOSIS — K5909 Other constipation: Secondary | ICD-10-CM | POA: Diagnosis not present

## 2020-03-31 DIAGNOSIS — R1031 Right lower quadrant pain: Secondary | ICD-10-CM | POA: Diagnosis not present

## 2020-03-31 DIAGNOSIS — K2270 Barrett's esophagus without dysplasia: Secondary | ICD-10-CM | POA: Diagnosis not present

## 2020-03-31 DIAGNOSIS — E669 Obesity, unspecified: Secondary | ICD-10-CM | POA: Diagnosis not present

## 2020-04-22 DIAGNOSIS — M5134 Other intervertebral disc degeneration, thoracic region: Secondary | ICD-10-CM | POA: Diagnosis not present

## 2020-04-22 DIAGNOSIS — M9902 Segmental and somatic dysfunction of thoracic region: Secondary | ICD-10-CM | POA: Diagnosis not present

## 2020-04-22 DIAGNOSIS — M62830 Muscle spasm of back: Secondary | ICD-10-CM | POA: Diagnosis not present

## 2020-04-22 DIAGNOSIS — M9903 Segmental and somatic dysfunction of lumbar region: Secondary | ICD-10-CM | POA: Diagnosis not present

## 2020-04-23 DIAGNOSIS — N189 Chronic kidney disease, unspecified: Secondary | ICD-10-CM | POA: Insufficient documentation

## 2020-04-23 DIAGNOSIS — N2581 Secondary hyperparathyroidism of renal origin: Secondary | ICD-10-CM | POA: Insufficient documentation

## 2020-04-23 DIAGNOSIS — D631 Anemia in chronic kidney disease: Secondary | ICD-10-CM | POA: Insufficient documentation

## 2020-04-23 DIAGNOSIS — N1832 Chronic kidney disease, stage 3b: Secondary | ICD-10-CM | POA: Diagnosis not present

## 2020-04-23 DIAGNOSIS — R8290 Unspecified abnormal findings in urine: Secondary | ICD-10-CM | POA: Diagnosis not present

## 2020-04-23 DIAGNOSIS — N390 Urinary tract infection, site not specified: Secondary | ICD-10-CM | POA: Insufficient documentation

## 2020-04-23 DIAGNOSIS — I129 Hypertensive chronic kidney disease with stage 1 through stage 4 chronic kidney disease, or unspecified chronic kidney disease: Secondary | ICD-10-CM | POA: Diagnosis not present

## 2020-04-23 DIAGNOSIS — E876 Hypokalemia: Secondary | ICD-10-CM | POA: Diagnosis not present

## 2020-04-29 DIAGNOSIS — M25561 Pain in right knee: Secondary | ICD-10-CM | POA: Diagnosis not present

## 2020-04-29 DIAGNOSIS — G8929 Other chronic pain: Secondary | ICD-10-CM | POA: Diagnosis not present

## 2020-04-29 DIAGNOSIS — M170 Bilateral primary osteoarthritis of knee: Secondary | ICD-10-CM | POA: Diagnosis not present

## 2020-04-29 DIAGNOSIS — Z86711 Personal history of pulmonary embolism: Secondary | ICD-10-CM | POA: Insufficient documentation

## 2020-04-29 DIAGNOSIS — M25461 Effusion, right knee: Secondary | ICD-10-CM | POA: Diagnosis not present

## 2020-04-30 DIAGNOSIS — M170 Bilateral primary osteoarthritis of knee: Secondary | ICD-10-CM | POA: Diagnosis not present

## 2020-04-30 DIAGNOSIS — M25561 Pain in right knee: Secondary | ICD-10-CM | POA: Diagnosis not present

## 2020-04-30 DIAGNOSIS — M25461 Effusion, right knee: Secondary | ICD-10-CM | POA: Diagnosis not present

## 2020-04-30 DIAGNOSIS — G8929 Other chronic pain: Secondary | ICD-10-CM | POA: Diagnosis not present

## 2020-05-05 DIAGNOSIS — M9903 Segmental and somatic dysfunction of lumbar region: Secondary | ICD-10-CM | POA: Diagnosis not present

## 2020-05-05 DIAGNOSIS — M9902 Segmental and somatic dysfunction of thoracic region: Secondary | ICD-10-CM | POA: Diagnosis not present

## 2020-05-05 DIAGNOSIS — M5134 Other intervertebral disc degeneration, thoracic region: Secondary | ICD-10-CM | POA: Diagnosis not present

## 2020-05-05 DIAGNOSIS — M62830 Muscle spasm of back: Secondary | ICD-10-CM | POA: Diagnosis not present

## 2020-05-12 DIAGNOSIS — R1084 Generalized abdominal pain: Secondary | ICD-10-CM | POA: Diagnosis not present

## 2020-05-12 DIAGNOSIS — E039 Hypothyroidism, unspecified: Secondary | ICD-10-CM | POA: Diagnosis not present

## 2020-05-12 DIAGNOSIS — R195 Other fecal abnormalities: Secondary | ICD-10-CM | POA: Diagnosis not present

## 2020-05-12 DIAGNOSIS — R519 Headache, unspecified: Secondary | ICD-10-CM | POA: Diagnosis not present

## 2020-05-12 DIAGNOSIS — R400 Somnolence: Secondary | ICD-10-CM | POA: Diagnosis not present

## 2020-05-12 DIAGNOSIS — K5900 Constipation, unspecified: Secondary | ICD-10-CM | POA: Diagnosis not present

## 2020-05-12 DIAGNOSIS — D649 Anemia, unspecified: Secondary | ICD-10-CM | POA: Diagnosis not present

## 2020-05-12 DIAGNOSIS — E785 Hyperlipidemia, unspecified: Secondary | ICD-10-CM | POA: Diagnosis not present

## 2020-05-12 DIAGNOSIS — R5383 Other fatigue: Secondary | ICD-10-CM | POA: Diagnosis not present

## 2020-05-14 DIAGNOSIS — R195 Other fecal abnormalities: Secondary | ICD-10-CM | POA: Diagnosis not present

## 2020-05-20 DIAGNOSIS — M9902 Segmental and somatic dysfunction of thoracic region: Secondary | ICD-10-CM | POA: Diagnosis not present

## 2020-05-20 DIAGNOSIS — M62830 Muscle spasm of back: Secondary | ICD-10-CM | POA: Diagnosis not present

## 2020-05-20 DIAGNOSIS — M9903 Segmental and somatic dysfunction of lumbar region: Secondary | ICD-10-CM | POA: Diagnosis not present

## 2020-05-20 DIAGNOSIS — M5134 Other intervertebral disc degeneration, thoracic region: Secondary | ICD-10-CM | POA: Diagnosis not present

## 2020-06-03 DIAGNOSIS — M9902 Segmental and somatic dysfunction of thoracic region: Secondary | ICD-10-CM | POA: Diagnosis not present

## 2020-06-03 DIAGNOSIS — M5134 Other intervertebral disc degeneration, thoracic region: Secondary | ICD-10-CM | POA: Diagnosis not present

## 2020-06-03 DIAGNOSIS — M9903 Segmental and somatic dysfunction of lumbar region: Secondary | ICD-10-CM | POA: Diagnosis not present

## 2020-06-03 DIAGNOSIS — M62830 Muscle spasm of back: Secondary | ICD-10-CM | POA: Diagnosis not present

## 2020-06-08 ENCOUNTER — Emergency Department
Admission: EM | Admit: 2020-06-08 | Discharge: 2020-06-09 | Disposition: A | Discharge status: Home / Self Care | Payer: Medicare HMO | Attending: Emergency Medicine | Admitting: Emergency Medicine

## 2020-06-08 ENCOUNTER — Emergency Department: Payer: Medicare HMO

## 2020-06-08 ENCOUNTER — Other Ambulatory Visit: Payer: Self-pay

## 2020-06-08 DIAGNOSIS — Z79899 Other long term (current) drug therapy: Secondary | ICD-10-CM | POA: Diagnosis not present

## 2020-06-08 DIAGNOSIS — E039 Hypothyroidism, unspecified: Secondary | ICD-10-CM | POA: Diagnosis not present

## 2020-06-08 DIAGNOSIS — K219 Gastro-esophageal reflux disease without esophagitis: Secondary | ICD-10-CM | POA: Diagnosis not present

## 2020-06-08 DIAGNOSIS — Z87891 Personal history of nicotine dependence: Secondary | ICD-10-CM | POA: Diagnosis not present

## 2020-06-08 DIAGNOSIS — Z7901 Long term (current) use of anticoagulants: Secondary | ICD-10-CM | POA: Insufficient documentation

## 2020-06-08 DIAGNOSIS — K2950 Unspecified chronic gastritis without bleeding: Secondary | ICD-10-CM | POA: Diagnosis not present

## 2020-06-08 DIAGNOSIS — I10 Essential (primary) hypertension: Secondary | ICD-10-CM | POA: Insufficient documentation

## 2020-06-08 DIAGNOSIS — R0789 Other chest pain: Secondary | ICD-10-CM | POA: Diagnosis present

## 2020-06-08 DIAGNOSIS — R0602 Shortness of breath: Secondary | ICD-10-CM | POA: Diagnosis not present

## 2020-06-08 LAB — BASIC METABOLIC PANEL
Anion gap: 13 (ref 5–15)
BUN: 21 mg/dL (ref 8–23)
CO2: 25 mmol/L (ref 22–32)
Calcium: 10.4 mg/dL — ABNORMAL HIGH (ref 8.9–10.3)
Chloride: 101 mmol/L (ref 98–111)
Creatinine, Ser: 1.84 mg/dL — ABNORMAL HIGH (ref 0.44–1.00)
GFR calc Af Amer: 30 mL/min — ABNORMAL LOW (ref >60)
GFR calc non Af Amer: 26 mL/min — ABNORMAL LOW (ref >60)
Glucose, Bld: 75 mg/dL (ref 70–99)
Potassium: 3.5 mmol/L (ref 3.5–5.1)
Sodium: 139 mmol/L (ref 135–145)

## 2020-06-08 LAB — CBC
HCT: 36 % (ref 36.0–46.0)
Hemoglobin: 11.6 g/dL — ABNORMAL LOW (ref 12.0–15.0)
MCH: 26.6 pg (ref 26.0–34.0)
MCHC: 32.2 g/dL (ref 30.0–36.0)
MCV: 82.6 fL (ref 80.0–100.0)
Platelets: 313 10*3/uL (ref 150–400)
RBC: 4.36 MIL/uL (ref 3.87–5.11)
RDW: 16.3 % — ABNORMAL HIGH (ref 11.5–15.5)
WBC: 8.9 10*3/uL (ref 4.0–10.5)
nRBC: 0 % (ref 0.0–0.2)

## 2020-06-08 LAB — TROPONIN I (HIGH SENSITIVITY)
Troponin I (High Sensitivity): 6 ng/L (ref <18)
Troponin I (High Sensitivity): 5 ng/L (ref <18)

## 2020-06-08 MED ORDER — ALUM & MAG HYDROXIDE-SIMETH 200-200-20 MG/5ML PO SUSP
30.0000 mL | Freq: Once | ORAL | Status: AC
  Administered 2020-06-09: 30 mL via ORAL
  Filled 2020-06-08: qty 30

## 2020-06-08 MED ORDER — FAMOTIDINE IN NACL 20-0.9 MG/50ML-% IV SOLN: 20.0000 mg | Freq: Once | INTRAVENOUS | Status: DC

## 2020-06-08 MED ORDER — SUCRALFATE 1 G PO TABS
1.0000 g | ORAL_TABLET | Freq: Once | ORAL | Status: AC
  Administered 2020-06-09: 1 g via ORAL
  Filled 2020-06-08: qty 1

## 2020-06-08 MED ORDER — SODIUM CHLORIDE 0.9% FLUSH: 3.0000 mL | Freq: Once | INTRAVENOUS | Status: DC

## 2020-06-08 NOTE — ED Triage Notes (Addendum)
Pt comes via POV from home with c/o CP that started earlier this week. Pt states no SOB. Pt does state pain when she eats. Pt denies indigestion.  Pt states black stools that started before she began her iron pills.

## 2020-06-08 NOTE — ED Notes (Signed)
Family up to stat desk inquiring about wait time.  Update given, verbalized understanding.

## 2020-06-09 DIAGNOSIS — K219 Gastro-esophageal reflux disease without esophagitis: Secondary | ICD-10-CM | POA: Diagnosis not present

## 2020-06-09 LAB — HEPATIC FUNCTION PANEL
ALT: 18 U/L (ref 0–44)
AST: 19 U/L (ref 15–41)
Albumin: 4 g/dL (ref 3.5–5.0)
Alkaline Phosphatase: 51 U/L (ref 38–126)
Bilirubin, Direct: <0.1 mg/dL (ref 0.0–0.2)
Total Bilirubin: 0.6 mg/dL (ref 0.3–1.2)
Total Protein: 7.7 g/dL (ref 6.5–8.1)

## 2020-06-09 LAB — LIPASE, BLOOD: Lipase: 51 U/L (ref 11–51)

## 2020-06-09 MED ORDER — METOCLOPRAMIDE HCL 5 MG PO TABS
5.0000 mg | ORAL_TABLET | Freq: Three times a day (TID) | ORAL | 0 refills | Status: DC
Start: 2020-06-09 — End: 2020-07-07

## 2020-06-09 MED ORDER — SUCRALFATE 1 G PO TABS
1.0000 g | ORAL_TABLET | Freq: Four times a day (QID) | ORAL | 1 refills | Status: DC
Start: 2020-06-09 — End: 2020-07-29

## 2020-06-09 MED ORDER — FAMOTIDINE 20 MG PO TABS
40.0000 mg | ORAL_TABLET | Freq: Once | ORAL | Status: AC
  Administered 2020-06-09: 40 mg via ORAL
  Filled 2020-06-09: qty 2

## 2020-06-09 MED ORDER — FAMOTIDINE 20 MG PO TABS
20.0000 mg | ORAL_TABLET | Freq: Two times a day (BID) | ORAL | 0 refills | Status: DC
Start: 2020-06-09 — End: 2020-07-04

## 2020-06-09 NOTE — ED Notes (Signed)
No peripheral IV placed this visit.   Discharge instructions reviewed with patient. Questions fielded by this RN. Patient verbalizes understanding of instructions. Patient discharged home in stable condition per [facility] . No acute distress noted at time of discharge.   Pt wheeled to outside ED for family pick up

## 2020-06-09 NOTE — Discharge Instructions (Signed)
Continue taking your medications.  In addition to the omeprazole, please add on Carafate, Reglan, and Pepcid for the next week to help with your symptoms.  Please call [facility] to see if they can move up your scheduled appointment.

## 2020-06-09 NOTE — ED Notes (Signed)
Pt able to eat food without increase to pain per family request of ongoing PO trial, pt ambulatory to toilet att

## 2020-06-09 NOTE — ED Provider Notes (Signed)
[facility] Emergency Department Provider Note  ____________________________________________  Time seen: Approximately 12:15 AM  I have reviewed the triage vital signs and the nursing notes.   HISTORY  Chief Complaint Chest Pain    HPI Latoya Freeman is a 79 y.o. female with a history of GERD, hypertension, pulmonary embolism who comes ED complaining of left upper quadrant abdominal pain that has been going on for several years, worse in the past week.  Worse with eating.  Nonradiating, no alleviating factors.  Feels like heartburn.  She often takes 3 or 4 times after a meal.  Not exertional, not pleuritic.  No diaphoresis or vomiting, no palpitations or syncope, no fever.  She had EGD about 2 years ago which showed Barrett's esophagus and erosive gastritis.   She had a CT scan of the abdomen and pelvis in April 2021 which was unremarkable.     Past Medical History:  Diagnosis Date  . Aneurysm ([place]) 1980  . Cancer ([place])   . GERD (gastroesophageal reflux disease)   . Hypertension 1980  . Hypothyroidism 1999  . Pneumonia   . Pulmonary embolism ([place])   . Shingles   . Wears dentures    full upper and lower     Patient Active Problem List   Diagnosis Date Noted  . Cyst of left ovary 04/04/2019  . Postmenopausal bleeding 04/04/2019  . Endometrial thickening on ultrasound 04/04/2019  . Pulmonary embolism ([place])   . GERD (gastroesophageal reflux disease) 08/07/2018  . Hypertension 08/07/2018  . Shingles 08/07/2018  . Thyroid disease 08/07/2018  . HCAP (healthcare-associated pneumonia) 07/27/2018     Past Surgical History:  Procedure Laterality Date  . BRAIN SURGERY     aneurysm  . BREAST CYST ASPIRATION Left 2005  . BREAST EXCISIONAL BIOPSY Left 2013   atypical ductal hyperplasia  . BREAST SURGERY  2013   LF Breast Wide EXC   . CHOLECYSTECTOMY  2004  . COLONOSCOPY    . COLONOSCOPY WITH PROPOFOL N/A 01/31/2018   Procedure: COLONOSCOPY  WITH PROPOFOL;  Surgeon: Lollie Sails, MD;  Location[facility] ENDOSCOPY;  Service: Endoscopy;  Laterality: N/A;  . DILATION AND CURETTAGE OF UTERUS N/A 05/03/2019   Procedure: DILATATION AND CURETTAGE;  Surgeon: Gae Dry, MD;  Location: [facility] ORS;  Service: Gynecology;  Laterality: N/A;  . ESOPHAGOGASTRODUODENOSCOPY N/A 01/31/2018   Procedure: ESOPHAGOGASTRODUODENOSCOPY (EGD);  Surgeon: Lollie Sails, MD;  Location: [facility] ENDOSCOPY;  Service: Endoscopy;  Laterality: N/A;  . 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 Authorizing Provider  amLODipine (NORVASC) 5 MG tablet Take 5 mg by mouth daily.  02/07/13   [provider]  apixaban (ELIQUIS) 5 MG TABS tablet Take 5 mg by mouth 2 (two) times daily.    [provider]  citalopram (CELEXA) 20 MG tablet Take 20 mg by mouth daily. 04/23/19   [provider]  Cyanocobalamin (VITAMIN B-12 PO) Take 1 tablet by mouth daily.     [provider]  famotidine (PEPCID) 20 MG tablet Take 1 tablet (20 mg total) by mouth 2 (two) times daily. 06/09/20   Carrie Mew,  MD  hydrochlorothiazide (HYDRODIURIL) 25 MG tablet Take 25 mg by mouth daily.  01/25/13   [provider]  lactulose (CHRONULAC) 10 GM/15ML solution Take 30 mLs (20 g total) by mouth daily as needed for mild constipation. 03/09/20   Paulette Blanch, MD  levothyroxine (SYNTHROID, LEVOTHROID) 75 MCG tablet Take 75 mcg by mouth daily.    [provider]  losartan (COZAAR) 50 MG tablet Take 50 mg by mouth daily.     [provider]  metoCLOPramide  (REGLAN) 5 MG tablet Take 1 tablet (5 mg total) by mouth 3 (three) times daily for 10 days. 06/09/20 06/19/20  Carrie Mew, MD  omeprazole (PRILOSEC) 20 MG capsule Take 20 mg by mouth daily.    [provider]  ondansetron (ZOFRAN ODT) 4 MG disintegrating tablet Take 1 tablet (4 mg total) by mouth every 8 (eight) hours as needed for nausea or vomiting. 03/09/20   Paulette Blanch, MD  potassium chloride (K-DUR,KLOR-CON) 10 MEQ tablet Take 20 mEq by mouth daily. 07/12/18   [provider]  sucralfate (CARAFATE) 1 g tablet Take 1 tablet (1 g total) by mouth 4 (four) times daily. 06/09/20   Carrie Mew, MD     Allergies Bee venom, Shellfish allergy, Tamoxifen, and Penicillins   Family History  Problem Relation Age of Onset  . Breast cancer Neg Hx     Social History Social History   Tobacco Use  . Smoking status: Former Smoker    Packs/day: 1.50    Years: 20.00    Pack years: 30.00    Quit date: 1990    Years since quitting: 31.5  . Smokeless tobacco: Never Used  Vaping Use  . Vaping Use: Never used  Substance Use Topics  . Alcohol use: No  . Drug use: No    Review of Systems  Constitutional:   No fever or chills.  ENT:   No sore throat. No rhinorrhea. Cardiovascular:   No chest pain or syncope. Respiratory:   No dyspnea or cough. Gastrointestinal:   Positive upper abdominal pain as above without vomiting and diarrhea.  Musculoskeletal:   Negative for focal pain or swelling All other systems reviewed and are negative except as documented above in ROS and HPI.  ____________________________________________   PHYSICAL EXAM:  VITAL SIGNS: ED Triage Vitals  Enc Vitals Group     BP 06/08/20 1455 134/74     Pulse Rate 06/08/20 1455 66     Resp 06/08/20 1455 18     Temp 06/08/20 1455 98.9 F (37.2 C)     Temp Source 06/08/20 1727 Oral     SpO2 06/08/20 1455 100 %     Weight 06/08/20 1453 201 lb (91.2 kg)     Height 06/08/20 1453 5' (1.524 m)      Head Circumference --      Peak Flow --      Pain Score 06/08/20 1453 9     Pain Loc --      Pain Edu? --      Excl. in [place]? --     Vital signs reviewed, nursing assessments reviewed.   Constitutional:   Alert and oriented. Non-toxic appearance. Eyes:   Conjunctivae are normal. EOMI. PERRL. ENT      Head:   Normocephalic and atraumatic.      Nose:   Wearing a mask.      Mouth/Throat:   Wearing a mask.      Neck:   No meningismus.  Full ROM.  No subcu emphysema Hematological/Lymphatic/Immunilogical:   No cervical lymphadenopathy. Cardiovascular:   RRR. Symmetric bilateral radial and DP pulses.  No murmurs. Cap refill less than 2 seconds. Respiratory:   Normal respiratory effort without tachypnea/retractions. Breath sounds are clear and equal bilaterally. No wheezes/rales/rhonchi. Gastrointestinal:   Soft with mild left upper quadrant tenderness. Non distended. There is no CVA tenderness.  No rebound, rigidity, or guarding. Musculoskeletal:   Normal range of motion in all extremities. No joint effusions.  No lower extremity tenderness.  No edema. Neurologic:   Normal speech and language.  Motor grossly intact. No acute focal neurologic deficits are appreciated.  Skin:    Skin is warm, dry and intact. No rash noted.  No petechiae, purpura, or bullae.  ____________________________________________    LABS (pertinent positives/negatives) (all labs ordered are listed, but only abnormal results are displayed) Labs Reviewed  BASIC METABOLIC PANEL - Abnormal; Notable for the following components:      Result Value   Creatinine, Ser 1.84 (*)    Calcium 10.4 (*)    GFR calc non Af Amer 26 (*)    GFR calc Af Amer 30 (*)    All other components within normal limits  CBC - Abnormal; Notable for the following components:   Hemoglobin 11.6 (*)    RDW 16.3 (*)    All other components within normal limits  HEPATIC FUNCTION PANEL  LIPASE, BLOOD  TROPONIN I (HIGH SENSITIVITY)  TROPONIN I  (HIGH SENSITIVITY)   ____________________________________________   EKG  Interpreted by me Normal sinus rhythm rate of 63, normal axis and intervals.  Poor R wave progression.  Normal ST segments and T waves.  No ischemic changes.  Voltage criteria for LVH in the high lateral leads.  ____________________________________________    RADIOLOGY  DG Chest 2 View  Result Date: 06/08/2020 CLINICAL DATA:  c/o CP that started earlier this week. Pt states no SOB. Pt does state pain when she eats. Pt states now she has some pain in her back. EXAM: CHEST - 2 VIEW COMPARISON:  Chest radiograph 10/06/2019 FINDINGS: Stable cardiomediastinal contours with normal heart size. Aortic atherosclerosis. Small bandlike opacity at the right lung base similar to prior likely atelectasis or scarring. No new focal consolidation. No pneumothorax or pleural effusion. No acute bony abnormality. IMPRESSION: No active cardiopulmonary process. Minimal right basilar atelectasis/scarring similar to prior. Electronically Signed   By: Audie Pinto M.D.   On: 06/08/2020 15:37    ____________________________________________   PROCEDURES Procedures  ____________________________________________  DIFFERENTIAL DIAGNOSIS   GERD/gastritis, non-STEMI, pancreatitis, choledocholithiasis, anxiety  CLINICAL IMPRESSION / ASSESSMENT AND PLAN / ED COURSE  Medications ordered in the ED: Medications  sodium chloride flush (NS) 0.9 % injection 3 mL (has no administration in time range)  sucralfate (CARAFATE) tablet 1 g (1 g Oral Given 06/09/20 0009)  alum & mag hydroxide-simeth (MAALOX/MYLANTA) 200-200-20 MG/5ML suspension 30 mL (30 mLs Oral Given 06/09/20 0009)  famotidine (PEPCID) tablet 40 mg (40 mg Oral Given 06/09/20 0009)    Pertinent labs & imaging results that were available during my care of the patient were reviewed by me and considered in my medical decision making (see chart for details).  Latoya Freeman was  evaluated in Emergency Department on 06/09/2020 for the symptoms described in the history of present illness. She was evaluated in the context of the global COVID-19 pandemic, which necessitated consideration that the patient might be at risk for infection with the SARS-CoV-2 virus that causes COVID-19.  Institutional protocols and algorithms that pertain to the evaluation of patients at risk for COVID-19 are in a state of rapid change based on information released by regulatory bodies including the CDC and federal and state organizations. These policies and algorithms were followed during the patient's care in the ED.   Patient presents with acute on chronic left upper quadrant abdominal pain, consistent with her previous established diagnosis of gastritis based on EGD.  Also complains of chronic black stools which is attributable to iron supplementation.  Vital signs are normal, hemoglobin and labs are unremarkable.  Serial troponins are normal.  Will give GI regimen and add on Pepcid Reglan and Carafate to the omeprazole that she takes currently, have her follow-up with gastroenterology as currently referred by her primary care doctor.  She is previously had cholecystectomy, LFTs are normal today, doubt biliary obstruction.  Doubt SBO, diverticulitis, intra-abdominal abscess or perforation, mesenteric ischemia, AAA, dissection, ACS PE, carditis, esophageal rupture.        ____________________________________________   FINAL CLINICAL IMPRESSION(S) / ED DIAGNOSES    Final diagnoses:  Gastroesophageal reflux disease, unspecified whether esophagitis present  Chronic gastritis without bleeding, unspecified gastritis type     ED Discharge Orders         Ordered    famotidine (PEPCID) 20 MG tablet  2 times daily     Discontinue  Reprint     06/09/20 0015    sucralfate (CARAFATE) 1 g tablet  4 times daily     Discontinue  Reprint     06/09/20 0015    metoCLOPramide (REGLAN) 5 MG tablet  3  times daily     Discontinue  Reprint     06/09/20 0015          Portions of this note were generated with dragon dictation software. Dictation errors may occur despite best attempts at proofreading.   Carrie Mew, MD 06/09/20 0021

## 2020-06-16 ENCOUNTER — Other Ambulatory Visit: Payer: Self-pay

## 2020-06-16 ENCOUNTER — Ambulatory Visit (INDEPENDENT_AMBULATORY_CARE_PROVIDER_SITE_OTHER): Payer: Medicare HMO | Admitting: Obstetrics & Gynecology

## 2020-06-16 ENCOUNTER — Encounter: Payer: Self-pay | Admitting: Obstetrics & Gynecology

## 2020-06-16 ENCOUNTER — Ambulatory Visit (INDEPENDENT_AMBULATORY_CARE_PROVIDER_SITE_OTHER): Payer: Medicare HMO

## 2020-06-16 ENCOUNTER — Other Ambulatory Visit: Payer: Self-pay | Admitting: Obstetrics & Gynecology

## 2020-06-16 VITALS — BP 120/80 | Ht 60.0 in | Wt 200.0 lb

## 2020-06-16 DIAGNOSIS — N83202 Unspecified ovarian cyst, left side: Secondary | ICD-10-CM

## 2020-06-16 NOTE — Progress Notes (Signed)
HPI: Pt is a 79 yo WF who has a known left ovarian cyst.  Pt first hd a cyst while pregnant 51 years ago.  No interval sx's, but was noted again a few years ago of having a left ovarian cyst.  She has since had CT or [place] to follow up on this (or for other reasons, w same cyst noted).  Denies bloating, weight change, or pain.  Ultrasound demonstrates cyst seen on left ovary area 3 cm.  PMHx: She  has a past medical history of Aneurysm ([place]) (1980), Cancer ([place]), GERD (gastroesophageal reflux disease), Hypertension (1980), Hypothyroidism (1999), Pneumonia, Pulmonary embolism ([place]), Shingles, and Wears dentures. Also,  has a past surgical history that includes Breast surgery (2013); Cholecystectomy (2004); Breast excisional biopsy (Left, 2013); Breast cyst aspiration (Left, 2005); Colonoscopy; Esophagogastroduodenoscopy (N/A, 01/31/2018); Colonoscopy with propofol (N/A, 01/31/2018); Shoulder arthroscopy (Left, 07/25/2018); Brain surgery; and Dilation and curettage of uterus (N/A, 05/03/2019)., family history is not on file.,  reports that she quit smoking about 31 years ago. She has a 30.00 pack-year smoking history. She has never used smokeless tobacco. She reports that she does not drink alcohol and does not use drugs.  She has a current medication list which includes the following prescription(s): amlodipine, apixaban, citalopram, cyanocobalamin, famotidine, hydrochlorothiazide, lactulose, levothyroxine, losartan, metoclopramide, omeprazole, ondansetron, potassium chloride, and sucralfate. Also, is allergic to bee venom, shellfish allergy, tamoxifen, and penicillins.  Review of Systems  All other systems reviewed and are negative.   Objective: BP 120/80   Ht 5' (1.524 m)   Wt 200 lb (90.7 kg)   BMI 39.06 kg/m   Physical examination Constitutional NAD, Conversant  Skin No rashes, lesions or ulceration.   Extremities: Moves all appropriately.  Normal ROM for age. No lymphadenopathy.  Neuro:  Grossly intact  Psych: Oriented to PPT.  Normal mood. Normal affect.   US PELVIS TRANSVAGINAL NON-OB (TV ONLY)  Result Date: 06/16/2020 Patient Name: CHENOAH MCNALLY DOB: 12/23/1940 MRN: [id number] ULTRASOUND REPORT Location: [facility] Date of Service: 06/16/2020 Indications:  Left adnexal cyst Findings: The uterus is anteverted and measures 5.9 x 4.2 x 2.8 cm. Echo texture is homogenous without evidence of focal masses. The Endometrium measures 2.7 mm. Right Ovary measures 2.7 x 1.3 x 1.7 cm. It is normal in appearance. Left Ovary measures 1.8 x 1.2 x 1.0 cm. It is normal in appearance. There is a cyst with anechoic fluid adjacent to the left ovary. The cyst measures 31.3 x 20.3 x 21.3 mm. No blood flow is seen within this cyst. There is no free fluid in the cul de sac. Impression: 1. Normal appearing uterus and cervix. 2. Normal appearing ovaries. 3. There is a 3.1 cm cyst with anechoic fluid adjacent to the left ovary. Recommendations: 1.Clinical correlation with the patient's History and Physical Exam. 2. Stable as compared to last imaging this year and last year Gweneth Dimitri, RT Review of ULTRASOUND.    I have personally reviewed images and report of recent ultrasound done at [facility].    Plan of management to be discussed with patient. Barnett Applebaum, MD, [facility], [facility] 06/16/2020  11:03 AM   Assessment:  Cyst of left ovary - Plan: US PELVIC COMPLETE WITH TRANSVAGINAL  Plan to continue conservative non-surgical management and observation.  Repeat US next year unless sx's develop.  Options for surgery and excision discussed.  A total of 30 minutes were spent face-to-face with the patient as well as preparation, review, communication,  and documentation during this encounter.   Barnett Applebaum, MD, [facility], [facility] 06/16/2020  11:04 AM

## 2020-06-17 DIAGNOSIS — M9903 Segmental and somatic dysfunction of lumbar region: Secondary | ICD-10-CM | POA: Diagnosis not present

## 2020-06-17 DIAGNOSIS — M9902 Segmental and somatic dysfunction of thoracic region: Secondary | ICD-10-CM | POA: Diagnosis not present

## 2020-06-17 DIAGNOSIS — M62830 Muscle spasm of back: Secondary | ICD-10-CM | POA: Diagnosis not present

## 2020-06-17 DIAGNOSIS — M5134 Other intervertebral disc degeneration, thoracic region: Secondary | ICD-10-CM | POA: Diagnosis not present

## 2020-06-24 DIAGNOSIS — D649 Anemia, unspecified: Secondary | ICD-10-CM | POA: Diagnosis not present

## 2020-06-24 DIAGNOSIS — R1084 Generalized abdominal pain: Secondary | ICD-10-CM | POA: Diagnosis not present

## 2020-06-24 DIAGNOSIS — N183 Chronic kidney disease, stage 3 unspecified: Secondary | ICD-10-CM | POA: Diagnosis not present

## 2020-06-25 DIAGNOSIS — M1712 Unilateral primary osteoarthritis, left knee: Secondary | ICD-10-CM | POA: Diagnosis not present

## 2020-06-25 DIAGNOSIS — G8929 Other chronic pain: Secondary | ICD-10-CM | POA: Diagnosis not present

## 2020-06-25 DIAGNOSIS — M25562 Pain in left knee: Secondary | ICD-10-CM | POA: Diagnosis not present

## 2020-06-25 DIAGNOSIS — M25561 Pain in right knee: Secondary | ICD-10-CM | POA: Diagnosis not present

## 2020-06-25 DIAGNOSIS — M25461 Effusion, right knee: Secondary | ICD-10-CM | POA: Diagnosis not present

## 2020-06-25 DIAGNOSIS — M170 Bilateral primary osteoarthritis of knee: Secondary | ICD-10-CM | POA: Diagnosis not present

## 2020-06-25 DIAGNOSIS — M1711 Unilateral primary osteoarthritis, right knee: Secondary | ICD-10-CM | POA: Diagnosis not present

## 2020-07-02 DIAGNOSIS — L814 Other melanin hyperpigmentation: Secondary | ICD-10-CM | POA: Diagnosis not present

## 2020-07-02 DIAGNOSIS — D492 Neoplasm of unspecified behavior of bone, soft tissue, and skin: Secondary | ICD-10-CM | POA: Diagnosis not present

## 2020-07-02 DIAGNOSIS — B079 Viral wart, unspecified: Secondary | ICD-10-CM | POA: Diagnosis not present

## 2020-07-02 DIAGNOSIS — L578 Other skin changes due to chronic exposure to nonionizing radiation: Secondary | ICD-10-CM | POA: Diagnosis not present

## 2020-07-02 DIAGNOSIS — L570 Actinic keratosis: Secondary | ICD-10-CM | POA: Diagnosis not present

## 2020-07-04 ENCOUNTER — Other Ambulatory Visit: Payer: Self-pay

## 2020-07-07 ENCOUNTER — Encounter: Payer: Self-pay | Admitting: Gastroenterology

## 2020-07-07 ENCOUNTER — Ambulatory Visit (INDEPENDENT_AMBULATORY_CARE_PROVIDER_SITE_OTHER): Payer: Medicare HMO | Admitting: Gastroenterology

## 2020-07-07 ENCOUNTER — Other Ambulatory Visit: Payer: Self-pay

## 2020-07-07 VITALS — BP 131/72 | HR 73 | Temp 98.0°F | Ht 60.0 in | Wt 203.0 lb

## 2020-07-07 DIAGNOSIS — K5900 Constipation, unspecified: Secondary | ICD-10-CM | POA: Diagnosis not present

## 2020-07-07 DIAGNOSIS — R1084 Generalized abdominal pain: Secondary | ICD-10-CM

## 2020-07-07 MED ORDER — LINACLOTIDE 145 MCG PO CAPS: 145.0000 ug | ORAL_CAPSULE | Freq: Every day | ORAL | 1 refills | Status: DC

## 2020-07-07 MED ORDER — OMEPRAZOLE 40 MG PO CPDR: 40.0000 mg | DELAYED_RELEASE_CAPSULE | Freq: Two times a day (BID) | ORAL | 0 refills | Status: DC

## 2020-07-07 NOTE — Patient Instructions (Signed)
Please take 32 ounces of water one hour before your ultrasound and hold it.

## 2020-07-07 NOTE — Addendum Note (Signed)
Addended by: Wayna Chalet on: 07/07/2020 11:49 AM   Modules accepted: Orders

## 2020-07-07 NOTE — Progress Notes (Signed)
Latoya Freeman [address]  [place]  [place], [place] [address]  Main: [phone]  Fax: [phone]   Gastroenterology Consultation  Referring Provider:     [name], MD Primary Care Physician:  [name], MD Reason for Consultation: Abdominal pain        HPI:    Chief complaint: Abdominal pain  Latoya Freeman is a 79 y.o. y/o female referred for consultation & management  by Dr. [name], MD.  Patient previously seen by [facility] clinic GI, most recently in May 2021, due to chronic abdominal pain.  Presents with her daughter.  When inquired about her symptoms, she reports bilateral lower quadrant abdominal pain since November 2020.  States it worsens with meals, improves with certain stretches.  No weight loss.  States she thinks this "ulcer" pain.  However, when I discussed the stomach ulcers usually cause pain in the upper abdomen region, then she stated, that sometimes she does have pain in the bilateral upper quadrant region, which is a different location than she gave and her initial history above.  Review of her previous record, and Dr. Ricky Stabs note from May 2021 states that patient has history of Barrett's esophagus with surveillance due in March 2022.  They do report history of right lower quadrant abdominal pain chronic constipation.  "Summary of evaluation: - CSY: 01/2018 - pandiverticulosis, one 3 mm polyp at hepatic flexure with absent tissue on biopsy, and one 4 mm polyp at splenic flexure with path showing tubular adenoma - EGD: 01/2018 - short-segment Barrett's esophagus, erosive gastritis with minimal chronic gastritis, single gastric polyp with minimal chronic inflammation, normal duodenum "  Please see their detailed notes.  They recommended peppermint oil after last visit.  Patient reports small stool as well.  Was taking Colace but continued to have constipation has been using Dulcolax suppositories as needed.  Has tried MiraLAX in  the past with inconsistent results.  No blood in stool.  See a recent CT scan from April 2021 which did not show any acute abnormalities   Past Medical History:  Diagnosis Date  . Aneurysm ([place]) 1980  . Cancer ([place])   . GERD (gastroesophageal reflux disease)   . Hypertension 1980  . Hypothyroidism 1999  . Pneumonia   . Pulmonary embolism ([place])   . Shingles   . Wears dentures    full upper and lower    Past Surgical History:  Procedure Laterality Date  . BRAIN SURGERY     aneurysm  . BREAST CYST ASPIRATION Left 2005  . BREAST EXCISIONAL BIOPSY Left 2013   atypical ductal hyperplasia  . BREAST SURGERY  2013   LF Breast Wide EXC   . CHOLECYSTECTOMY  2004  . COLONOSCOPY    . COLONOSCOPY WITH PROPOFOL N/A 01/31/2018   Procedure: COLONOSCOPY WITH PROPOFOL;  Surgeon: Lollie Sails, MD;  Location[facility] ENDOSCOPY;  Service: Endoscopy;  Laterality: N/A;  . DILATION AND CURETTAGE OF UTERUS N/A 05/03/2019   Procedure: DILATATION AND CURETTAGE;  Surgeon: Gae Dry, MD;  Location: [facility] ORS;  Service: Gynecology;  Laterality: N/A;  . ESOPHAGOGASTRODUODENOSCOPY N/A 01/31/2018   Procedure: ESOPHAGOGASTRODUODENOSCOPY (EGD);  Surgeon: Lollie Sails, MD;  Location: [facility] ENDOSCOPY;  Service: Endoscopy;  Laterality: N/A;  . SHOULDER ARTHROSCOPY Left 07/25/2018   Procedure: SHOULDER MINI OPEN ROTATOR CUFF REPAIR  BICEPS TENDOSIS ARTHROSCOPIC DISTAL CLAVICLE EXCISION  SUBACROMIAL DECOMP;  Surgeon: Leim Fabry, MD;  Location: [place];  Service: Orthopedics;  Laterality: Left;  BEACH CHAIR WITH SPYDER SMITH & NEPHEW HEAD COIL ANCHOR FOOTPRINT ANCHOR QFIX ANCHOR    Prior to Admission medications   Medication Sig Start Date End Date Taking? Authorizing Provider  acetaminophen (TYLENOL) 500 MG tablet Take 1 tablet by mouth every 6 (six) hours as needed.   Yes [provider]  amLODipine (NORVASC) 5 MG tablet Take 5 mg by mouth daily.  02/07/13  Yes [provider]  apixaban (ELIQUIS) 5 MG TABS tablet Take 5 mg by mouth 2 (two) times daily.   Yes [provider]  citalopram (CELEXA) 20 MG tablet Take 20 mg by mouth daily. 04/23/19  Yes [provider]  hydrochlorothiazide (HYDRODIURIL) 25 MG tablet Take 25 mg by mouth daily.  01/25/13  Yes [provider]  lactulose (CHRONULAC) 10 GM/15ML solution Take 30 mLs (20 g total) by mouth daily as needed for mild constipation. 03/09/20  Yes Paulette Blanch, MD  levothyroxine (SYNTHROID, LEVOTHROID) 75 MCG tablet Take 75 mcg by mouth daily.   Yes [provider]  losartan (COZAAR) 50 MG tablet Take 50 mg by mouth daily.    Yes [provider]  omeprazole (PRILOSEC) 40 MG capsule Take 1 capsule by mouth daily. 01/25/20  Yes [provider]  potassium chloride (K-DUR,KLOR-CON) 10 MEQ tablet Take 20 mEq by mouth daily. 07/12/18  Yes [provider]  sucralfate (CARAFATE) 1 g tablet Take 1 tablet (1 g total) by mouth 4 (four) times daily. 06/09/20  Yes Carrie Mew, MD  linaclotide [facility]) 145 MCG CAPS capsule Take 1 capsule (145 mcg total) by mouth daily before breakfast. 07/07/20 08/06/20  Virgel Manifold, MD  ondansetron (ZOFRAN ODT) 4 MG disintegrating tablet Take 1 tablet (4 mg total) by mouth every 8 (eight) hours as needed for nausea or vomiting. Patient not taking: Reported on 07/07/2020 03/09/20   Paulette Blanch, MD    Family History  Problem Relation Age of Onset  . Breast cancer Neg Hx      Social History   Tobacco Use  . Smoking status: Former Smoker    Packs/day: 1.50    Years: 20.00    Pack years: 30.00    Quit date: 1990    Years since quitting: 31.6  . Smokeless tobacco: Never Used  Vaping Use  . Vaping Use: Never used  Substance Use Topics  . Alcohol use: No  . Drug use: No    Allergies as of 07/07/2020 - Review Complete 07/07/2020  Allergen Reaction Noted  . Bee venom Swelling 01/31/2018  . Shellfish allergy  Swelling 01/31/2018  . Tamoxifen Hives 07/20/2018  . Penicillins Hives, Rash, and Other (See Comments) 12/21/2012    Review of Systems:    All systems reviewed and negative except where noted in HPI.   Physical Exam:  BP 131/72   Pulse 73   Temp 98 F (36.7 C) (Oral)   Ht 5' (1.524 m)   Wt 203 lb (92.1 kg)   BMI 39.65 kg/m  No LMP recorded. Patient is postmenopausal. Psych:  Alert and cooperative. Normal mood and affect. General:   Alert,  Well-developed, well-nourished, pleasant and cooperative in NAD Head:  Normocephalic and atraumatic. Eyes:  Sclera clear, no icterus.   Conjunctiva pink. Ears:  Normal auditory acuity. Nose:  No deformity, discharge, or lesions. Mouth:  No deformity or lesions,oropharynx pink & moist. Neck:  Supple; no masses or thyromegaly. Abdomen:  Normal bowel sounds.  No bruits.  Soft, non-tender and non-distended without  masses, hepatosplenomegaly or hernias noted.  No guarding or rebound tenderness.    Msk:  Symmetrical without gross deformities. Good, equal movement & strength bilaterally. Pulses:  Normal pulses noted. Extremities:  No clubbing or edema.  No cyanosis. Neurologic:  Alert and oriented x3;  grossly normal neurologically. Skin:  Intact without significant lesions or rashes. No jaundice. Lymph Nodes:  No significant cervical adenopathy. Psych:  Alert and cooperative. Normal mood and affect.   Labs: CBC    Component Value Date/Time   WBC 8.9 06/08/2020 1456   RBC 4.36 06/08/2020 1456   HGB 11.6 (L) 06/08/2020 1456   HCT 36.0 06/08/2020 1456   PLT 313 06/08/2020 1456   MCV 82.6 06/08/2020 1456   MCH 26.6 06/08/2020 1456   MCHC 32.2 06/08/2020 1456   RDW 16.3 (H) 06/08/2020 1456   LYMPHSABS 2.6 07/27/2018 2018   MONOABS 0.9 07/27/2018 2018   EOSABS 0.0 07/27/2018 2018   BASOSABS 0.1 07/27/2018 2018   CMP     Component Value Date/Time   NA 139 06/08/2020 1456   K 3.5 06/08/2020 1456   K 3.0 (L) 02/14/2014 1447   CL 101  06/08/2020 1456   CO2 25 06/08/2020 1456   GLUCOSE 75 06/08/2020 1456   BUN 21 06/08/2020 1456   CREATININE 1.84 (H) 06/08/2020 1456   CALCIUM 10.4 (H) 06/08/2020 1456   PROT 7.7 06/08/2020 1728   ALBUMIN 4.0 06/08/2020 1728   AST 19 06/08/2020 1728   ALT 18 06/08/2020 1728   ALKPHOS 51 06/08/2020 1728   BILITOT 0.6 06/08/2020 1728   GFRNONAA 26 (L) 06/08/2020 1456   GFRAA 30 (L) 06/08/2020 1456    Imaging Studies: DG Chest 2 View  Result Date: 06/08/2020 CLINICAL DATA:  c/o CP that started earlier this week. Pt states no SOB. Pt does state pain when she eats. Pt states now she has some pain in her back. EXAM: CHEST - 2 VIEW COMPARISON:  Chest radiograph 10/06/2019 FINDINGS: Stable cardiomediastinal contours with normal heart size. Aortic atherosclerosis. Small bandlike opacity at the right lung base similar to prior likely atelectasis or scarring. No new focal consolidation. No pneumothorax or pleural effusion. No acute bony abnormality. IMPRESSION: No active cardiopulmonary process. Minimal right basilar atelectasis/scarring similar to prior. Electronically Signed   By: Audie Pinto M.D.   On: 06/08/2020 15:37   US PELVIS TRANSVAGINAL NON-OB (TV ONLY)  Result Date: 06/16/2020 Patient Name: AKAYLAH LALLEY DOB: Apr 01, 1941 MRN: [id number] ULTRASOUND REPORT Location: [facility] Date of Service: 06/16/2020 Indications:  Left adnexal cyst Findings: The uterus is anteverted and measures 5.9 x 4.2 x 2.8 cm. Echo texture is homogenous without evidence of focal masses. The Endometrium measures 2.7 mm. Right Ovary measures 2.7 x 1.3 x 1.7 cm. It is normal in appearance. Left Ovary measures 1.8 x 1.2 x 1.0 cm. It is normal in appearance. There is a cyst with anechoic fluid adjacent to the left ovary. The cyst measures 31.3 x 20.3 x 21.3 mm. No blood flow is seen within this cyst. There is no free fluid in the cul de sac. Impression: 1. Normal appearing uterus and cervix. 2. Normal appearing  ovaries. 3. There is a 3.1 cm cyst with anechoic fluid adjacent to the left ovary. Recommendations: 1.Clinical correlation with the patient's History and Physical Exam. 2. Stable as compared to last imaging this year and last year Gweneth Dimitri, RT Review of ULTRASOUND.    I have personally reviewed images and report of recent ultrasound  done at [facility].    Plan of management to be discussed with patient. Barnett Applebaum, MD, [facility], [facility] 06/16/2020  11:03 AM   Assessment and Plan:   NIGEL WESSMAN is a 79 y.o. y/o female has been referred for abdominal pain  Patient has chronic abdominal pain issues, with extensive work-up by [facility] GI, including EGDs, colonoscopies, recent CT scans , all being unrevealing  We did discuss with the patient and her daughter that her symptoms are likely combination of functional symptoms and chronic constipation  However, given her ongoing pain and some postprandial symptoms, they would like further evaluation  Repeat EGD colonoscopy not indicated due to absence of unlikely to show anything new, or change management  Obtain mesenteric Doppler given history of aortic atherosclerosis, and CAD to rule out mesenteric ischemia.  Increase omeprazole for 65month as symptoms may be to dyspepsia as well  (Risks of PPI use were discussed with patient including bone loss, C. Diff diarrhea, pneumonia, infections, CKD, electrolyte abnormalities.  Pt. Verbalizes understanding and chooses to continue the medication.)  Discontinue colace  and start Linzess for constipation  Dr Latoya Freeman  Speech recognition software was used to dictate the above note.

## 2020-07-09 ENCOUNTER — Other Ambulatory Visit: Payer: Self-pay

## 2020-07-09 ENCOUNTER — Ambulatory Visit
Admission: RE | Admit: 2020-07-09 | Discharge: 2020-07-09 | Disposition: A | Discharge status: Home / Self Care | Payer: Medicare HMO | Source: Ambulatory Visit | Attending: Obstetrics & Gynecology | Admitting: Obstetrics & Gynecology

## 2020-07-09 DIAGNOSIS — N83202 Unspecified ovarian cyst, left side: Secondary | ICD-10-CM

## 2020-07-14 ENCOUNTER — Ambulatory Visit
Admission: RE | Admit: 2020-07-14 | Discharge: 2020-07-14 | Disposition: A | Discharge status: Home / Self Care | Payer: Medicare HMO | Source: Ambulatory Visit | Attending: Gastroenterology | Admitting: Gastroenterology

## 2020-07-14 ENCOUNTER — Other Ambulatory Visit: Payer: Self-pay

## 2020-07-14 DIAGNOSIS — E039 Hypothyroidism, unspecified: Secondary | ICD-10-CM | POA: Diagnosis not present

## 2020-07-14 DIAGNOSIS — N2581 Secondary hyperparathyroidism of renal origin: Secondary | ICD-10-CM | POA: Diagnosis not present

## 2020-07-14 DIAGNOSIS — R1013 Epigastric pain: Secondary | ICD-10-CM | POA: Diagnosis not present

## 2020-07-14 DIAGNOSIS — N83292 Other ovarian cyst, left side: Secondary | ICD-10-CM | POA: Diagnosis not present

## 2020-07-14 DIAGNOSIS — E876 Hypokalemia: Secondary | ICD-10-CM | POA: Diagnosis not present

## 2020-07-14 DIAGNOSIS — Z79899 Other long term (current) drug therapy: Secondary | ICD-10-CM | POA: Diagnosis not present

## 2020-07-14 DIAGNOSIS — D631 Anemia in chronic kidney disease: Secondary | ICD-10-CM | POA: Diagnosis present

## 2020-07-14 DIAGNOSIS — K219 Gastro-esophageal reflux disease without esophagitis: Secondary | ICD-10-CM | POA: Diagnosis present

## 2020-07-14 DIAGNOSIS — I10 Essential (primary) hypertension: Secondary | ICD-10-CM | POA: Diagnosis not present

## 2020-07-14 DIAGNOSIS — Z87891 Personal history of nicotine dependence: Secondary | ICD-10-CM | POA: Diagnosis not present

## 2020-07-14 DIAGNOSIS — K581 Irritable bowel syndrome with constipation: Secondary | ICD-10-CM | POA: Diagnosis not present

## 2020-07-14 DIAGNOSIS — K7689 Other specified diseases of liver: Secondary | ICD-10-CM | POA: Diagnosis not present

## 2020-07-14 DIAGNOSIS — Z86711 Personal history of pulmonary embolism: Secondary | ICD-10-CM | POA: Diagnosis not present

## 2020-07-14 DIAGNOSIS — Z6839 Body mass index (BMI) 39.0-39.9, adult: Secondary | ICD-10-CM | POA: Diagnosis not present

## 2020-07-14 DIAGNOSIS — I129 Hypertensive chronic kidney disease with stage 1 through stage 4 chronic kidney disease, or unspecified chronic kidney disease: Secondary | ICD-10-CM | POA: Diagnosis not present

## 2020-07-14 DIAGNOSIS — F329 Major depressive disorder, single episode, unspecified: Secondary | ICD-10-CM | POA: Diagnosis present

## 2020-07-14 DIAGNOSIS — I700 Atherosclerosis of aorta: Secondary | ICD-10-CM | POA: Diagnosis not present

## 2020-07-14 DIAGNOSIS — Z91030 Bee allergy status: Secondary | ICD-10-CM | POA: Diagnosis not present

## 2020-07-14 DIAGNOSIS — Z79890 Hormone replacement therapy (postmenopausal): Secondary | ICD-10-CM | POA: Diagnosis not present

## 2020-07-14 DIAGNOSIS — K55059 Acute (reversible) ischemia of intestine, part and extent unspecified: Secondary | ICD-10-CM | POA: Diagnosis not present

## 2020-07-14 DIAGNOSIS — N179 Acute kidney failure, unspecified: Secondary | ICD-10-CM | POA: Diagnosis not present

## 2020-07-14 DIAGNOSIS — Z20822 Contact with and (suspected) exposure to covid-19: Secondary | ICD-10-CM | POA: Diagnosis not present

## 2020-07-14 DIAGNOSIS — I774 Celiac artery compression syndrome: Secondary | ICD-10-CM | POA: Diagnosis not present

## 2020-07-14 DIAGNOSIS — Z7901 Long term (current) use of anticoagulants: Secondary | ICD-10-CM | POA: Diagnosis not present

## 2020-07-14 DIAGNOSIS — N281 Cyst of kidney, acquired: Secondary | ICD-10-CM | POA: Diagnosis not present

## 2020-07-14 DIAGNOSIS — J9811 Atelectasis: Secondary | ICD-10-CM | POA: Diagnosis not present

## 2020-07-14 DIAGNOSIS — N1832 Chronic kidney disease, stage 3b: Secondary | ICD-10-CM | POA: Diagnosis present

## 2020-07-14 DIAGNOSIS — R1010 Upper abdominal pain, unspecified: Secondary | ICD-10-CM | POA: Diagnosis not present

## 2020-07-14 DIAGNOSIS — I708 Atherosclerosis of other arteries: Secondary | ICD-10-CM | POA: Diagnosis not present

## 2020-07-14 DIAGNOSIS — Z91013 Allergy to seafood: Secondary | ICD-10-CM | POA: Diagnosis not present

## 2020-07-14 DIAGNOSIS — K589 Irritable bowel syndrome without diarrhea: Secondary | ICD-10-CM | POA: Diagnosis present

## 2020-07-14 DIAGNOSIS — R1084 Generalized abdominal pain: Secondary | ICD-10-CM

## 2020-07-14 DIAGNOSIS — E669 Obesity, unspecified: Secondary | ICD-10-CM | POA: Diagnosis present

## 2020-07-14 DIAGNOSIS — R109 Unspecified abdominal pain: Secondary | ICD-10-CM | POA: Diagnosis not present

## 2020-07-15 ENCOUNTER — Telehealth: Payer: Self-pay

## 2020-07-15 DIAGNOSIS — R1084 Generalized abdominal pain: Secondary | ICD-10-CM

## 2020-07-15 NOTE — Telephone Encounter (Signed)
-----   Message from Virgel Manifold, MD sent at 07/14/2020 12:52 PM EDT ----- Herb Grays please let the patient know, her results did not show any concerning stenosis of her arteries.  However, the superior mesenteric artery was reported to have elevated velocity, although this may represent an artifact.  I recommend referral to Dr. Lucky Cowboy for his opinion in this regard.

## 2020-07-15 NOTE — Telephone Encounter (Signed)
Called patient and she did not answer. I was also not able to leave her a voicemail since it kept ringing and ringing. I then called her daughter-Mrs. Jarvis Morgan and explained the Ultrasound results. I also told her that I had sent the referral to Dr. Bunnie Domino office and that they would contact her mother to schedule her an appointment. Shelia understood and had no further questions.

## 2020-07-17 ENCOUNTER — Inpatient Hospital Stay
Admission: EM | Admit: 2020-07-17 | Discharge: 2020-07-20 | DRG: 391 | Disposition: A | Discharge status: Home / Self Care | Payer: Medicare HMO | Attending: Internal Medicine | Admitting: Internal Medicine

## 2020-07-17 ENCOUNTER — Other Ambulatory Visit: Payer: Self-pay

## 2020-07-17 ENCOUNTER — Telehealth: Payer: Self-pay

## 2020-07-17 ENCOUNTER — Emergency Department: Payer: Medicare HMO

## 2020-07-17 DIAGNOSIS — I129 Hypertensive chronic kidney disease with stage 1 through stage 4 chronic kidney disease, or unspecified chronic kidney disease: Secondary | ICD-10-CM | POA: Diagnosis present

## 2020-07-17 DIAGNOSIS — N2581 Secondary hyperparathyroidism of renal origin: Secondary | ICD-10-CM | POA: Diagnosis present

## 2020-07-17 DIAGNOSIS — Z79899 Other long term (current) drug therapy: Secondary | ICD-10-CM

## 2020-07-17 DIAGNOSIS — Z7901 Long term (current) use of anticoagulants: Secondary | ICD-10-CM | POA: Diagnosis not present

## 2020-07-17 DIAGNOSIS — F329 Major depressive disorder, single episode, unspecified: Secondary | ICD-10-CM | POA: Diagnosis present

## 2020-07-17 DIAGNOSIS — J9811 Atelectasis: Secondary | ICD-10-CM | POA: Diagnosis present

## 2020-07-17 DIAGNOSIS — I700 Atherosclerosis of aorta: Secondary | ICD-10-CM | POA: Diagnosis present

## 2020-07-17 DIAGNOSIS — I774 Celiac artery compression syndrome: Secondary | ICD-10-CM

## 2020-07-17 DIAGNOSIS — E669 Obesity, unspecified: Secondary | ICD-10-CM | POA: Diagnosis present

## 2020-07-17 DIAGNOSIS — N179 Acute kidney failure, unspecified: Secondary | ICD-10-CM | POA: Diagnosis present

## 2020-07-17 DIAGNOSIS — R1010 Upper abdominal pain, unspecified: Principal | ICD-10-CM

## 2020-07-17 DIAGNOSIS — Z87891 Personal history of nicotine dependence: Secondary | ICD-10-CM | POA: Diagnosis not present

## 2020-07-17 DIAGNOSIS — D631 Anemia in chronic kidney disease: Secondary | ICD-10-CM | POA: Diagnosis present

## 2020-07-17 DIAGNOSIS — E876 Hypokalemia: Secondary | ICD-10-CM | POA: Diagnosis present

## 2020-07-17 DIAGNOSIS — Z91030 Bee allergy status: Secondary | ICD-10-CM

## 2020-07-17 DIAGNOSIS — K581 Irritable bowel syndrome with constipation: Secondary | ICD-10-CM | POA: Diagnosis not present

## 2020-07-17 DIAGNOSIS — I708 Atherosclerosis of other arteries: Secondary | ICD-10-CM | POA: Diagnosis not present

## 2020-07-17 DIAGNOSIS — Z6839 Body mass index (BMI) 39.0-39.9, adult: Secondary | ICD-10-CM

## 2020-07-17 DIAGNOSIS — Z86711 Personal history of pulmonary embolism: Secondary | ICD-10-CM

## 2020-07-17 DIAGNOSIS — K55059 Acute (reversible) ischemia of intestine, part and extent unspecified: Secondary | ICD-10-CM | POA: Diagnosis present

## 2020-07-17 DIAGNOSIS — Z79890 Hormone replacement therapy (postmenopausal): Secondary | ICD-10-CM | POA: Diagnosis not present

## 2020-07-17 DIAGNOSIS — K219 Gastro-esophageal reflux disease without esophagitis: Secondary | ICD-10-CM | POA: Diagnosis present

## 2020-07-17 DIAGNOSIS — E039 Hypothyroidism, unspecified: Secondary | ICD-10-CM

## 2020-07-17 DIAGNOSIS — Z91013 Allergy to seafood: Secondary | ICD-10-CM

## 2020-07-17 DIAGNOSIS — Z20822 Contact with and (suspected) exposure to covid-19: Secondary | ICD-10-CM | POA: Diagnosis present

## 2020-07-17 DIAGNOSIS — K559 Vascular disorder of intestine, unspecified: Secondary | ICD-10-CM | POA: Diagnosis present

## 2020-07-17 DIAGNOSIS — I10 Essential (primary) hypertension: Secondary | ICD-10-CM | POA: Diagnosis not present

## 2020-07-17 DIAGNOSIS — N1832 Chronic kidney disease, stage 3b: Secondary | ICD-10-CM | POA: Diagnosis present

## 2020-07-17 DIAGNOSIS — K589 Irritable bowel syndrome without diarrhea: Secondary | ICD-10-CM | POA: Diagnosis present

## 2020-07-17 DIAGNOSIS — R109 Unspecified abdominal pain: Secondary | ICD-10-CM

## 2020-07-17 LAB — CBC WITH DIFFERENTIAL/PLATELET
Abs Immature Granulocytes: 0.02 10*3/uL (ref 0.00–0.07)
Basophils Absolute: 0.1 10*3/uL (ref 0.0–0.1)
Basophils Relative: 1 %
Eosinophils Absolute: 0.1 10*3/uL (ref 0.0–0.5)
Eosinophils Relative: 1 %
HCT: 33.1 % — ABNORMAL LOW (ref 36.0–46.0)
Hemoglobin: 10.9 g/dL — ABNORMAL LOW (ref 12.0–15.0)
Immature Granulocytes: 0 %
Lymphocytes Relative: 36 %
Lymphs Abs: 3.2 10*3/uL (ref 0.7–4.0)
MCH: 27.5 pg (ref 26.0–34.0)
MCHC: 32.9 g/dL (ref 30.0–36.0)
MCV: 83.6 fL (ref 80.0–100.0)
Monocytes Absolute: 0.6 10*3/uL (ref 0.1–1.0)
Monocytes Relative: 7 %
Neutro Abs: 5 10*3/uL (ref 1.7–7.7)
Neutrophils Relative %: 55 %
Platelets: 347 10*3/uL (ref 150–400)
RBC: 3.96 MIL/uL (ref 3.87–5.11)
RDW: 15.7 % — ABNORMAL HIGH (ref 11.5–15.5)
WBC: 9 10*3/uL (ref 4.0–10.5)
nRBC: 0 % (ref 0.0–0.2)

## 2020-07-17 LAB — URINALYSIS, COMPLETE (UACMP) WITH MICROSCOPIC
Bacteria, UA: NONE SEEN
Bilirubin Urine: NEGATIVE
Glucose, UA: NEGATIVE mg/dL
Hgb urine dipstick: NEGATIVE
Ketones, ur: NEGATIVE mg/dL
Nitrite: NEGATIVE
Protein, ur: NEGATIVE mg/dL
Specific Gravity, Urine: 1.005 (ref 1.005–1.030)
Squamous Epithelial / HPF: NONE SEEN (ref 0–5)
pH: 6 (ref 5.0–8.0)

## 2020-07-17 LAB — COMPREHENSIVE METABOLIC PANEL
ALT: 13 U/L (ref 0–44)
AST: 19 U/L (ref 15–41)
Albumin: 4 g/dL (ref 3.5–5.0)
Alkaline Phosphatase: 53 U/L (ref 38–126)
Anion gap: 7 (ref 5–15)
BUN: 18 mg/dL (ref 8–23)
CO2: 30 mmol/L (ref 22–32)
Calcium: 10.4 mg/dL — ABNORMAL HIGH (ref 8.9–10.3)
Chloride: 101 mmol/L (ref 98–111)
Creatinine, Ser: 1.54 mg/dL — ABNORMAL HIGH (ref 0.44–1.00)
GFR calc Af Amer: 37 mL/min — ABNORMAL LOW (ref >60)
GFR calc non Af Amer: 32 mL/min — ABNORMAL LOW (ref >60)
Glucose, Bld: 96 mg/dL (ref 70–99)
Potassium: 3.3 mmol/L — ABNORMAL LOW (ref 3.5–5.1)
Sodium: 138 mmol/L (ref 135–145)
Total Bilirubin: 0.7 mg/dL (ref 0.3–1.2)
Total Protein: 8 g/dL (ref 6.5–8.1)

## 2020-07-17 LAB — LACTIC ACID, PLASMA: Lactic Acid, Venous: 1.2 mmol/L (ref 0.5–1.9)

## 2020-07-17 LAB — LIPASE, BLOOD: Lipase: 57 U/L — ABNORMAL HIGH (ref 11–51)

## 2020-07-17 MED ORDER — IOHEXOL 350 MG/ML SOLN
75.0000 mL | Freq: Once | INTRAVENOUS | Status: AC | PRN
  Administered 2020-07-17: 75 mL via INTRAVENOUS

## 2020-07-17 MED ORDER — MORPHINE SULFATE (PF) 4 MG/ML IV SOLN
6.0000 mg | Freq: Once | INTRAVENOUS | Status: AC
  Administered 2020-07-17: 6 mg via INTRAVENOUS
  Filled 2020-07-17: qty 2

## 2020-07-17 MED ORDER — SODIUM CHLORIDE 0.9 % IV BOLUS
1000.0000 mL | Freq: Once | INTRAVENOUS | Status: AC
  Administered 2020-07-17: 1000 mL via INTRAVENOUS

## 2020-07-17 MED ORDER — ONDANSETRON HCL 4 MG/2ML IJ SOLN
4.0000 mg | Freq: Once | INTRAMUSCULAR | Status: AC
  Administered 2020-07-17: 4 mg via INTRAVENOUS
  Filled 2020-07-17: qty 2

## 2020-07-17 MED ORDER — MORPHINE SULFATE (PF) 4 MG/ML IV SOLN
4.0000 mg | Freq: Once | INTRAVENOUS | Status: AC
  Administered 2020-07-17: 4 mg via INTRAVENOUS
  Filled 2020-07-17: qty 1

## 2020-07-17 MED ORDER — SODIUM CHLORIDE 0.9 % IV SOLN: Freq: Once | INTRAVENOUS | Status: DC

## 2020-07-17 NOTE — ED Triage Notes (Signed)
Pt daughter reports Dr. Bunnie Domino office advised her to come to the ED and they would consult her here because she got an abnormal image in her superior mesenteric vein today.

## 2020-07-17 NOTE — Telephone Encounter (Signed)
Patient called stating that she woke up with severe abdominal pain and wanted to be seen at the office today because she did not wanted to go to the ED. I told her that Dr. Bonna Gains is on call this week and not seeing patients. I recommended for her to call her PCP or go to an urgent care if the pain was severe. She stated that she would call her PCP and see if they would see her but once again refused to go to the ED. Patient denied fever, chills, nausea, vomiting, diarrhea or constipation. I then sent a message to Dr. Bonna Gains letting her know about the patient. Waiting for an advise.

## 2020-07-17 NOTE — Telephone Encounter (Signed)
Dr. Bonna Gains recommended for her to go the ED if her abdominal pain was severe or the other option was for her to get a STAT CT Scan. However, when I was going to call patient, I saw that she went to the ED. I then informed Dr. Bonna Gains that the patient went to the ED right now.

## 2020-07-17 NOTE — ED Notes (Signed)
Patient transported to CT 

## 2020-07-17 NOTE — ED Notes (Addendum)
Pt placed on 2L [place] due to oxygen sats being 87-88% after morphine. Respirations even and unlabored. O2 sats 95% with 2L.

## 2020-07-17 NOTE — ED Triage Notes (Signed)
Pt state she has been having abdominal pain and problems with her bowels for a while- pt was seen at [facility] and had an [place] of her arteries in her abdomen done on Monday- pt states the results were called into Dr Lucky Cowboy, vascular, who told her to come here to be seen

## 2020-07-17 NOTE — ED Notes (Signed)
ED Provider at bedside. 

## 2020-07-17 NOTE — ED Notes (Signed)
Blue top sent with other blood

## 2020-07-17 NOTE — ED Provider Notes (Signed)
[facility] Emergency Department Provider Note  ____________________________________________   First MD Initiated Contact with Patient 07/17/20 2121     (approximate)  I have reviewed the triage vital signs and the nursing notes.   HISTORY  Chief Complaint Abdominal Pain    HPI Latoya Freeman is a 79 y.o. female  Here with severe epigastric abd pain. Pt has unfortunately had increasingly severe epigastric abd pain over the past year. She has had multiple studies for this w/o apparent source. She just established care with Dr. Enriqueta Shutter and had an u/s done last week, which showed possible increased flow in SMA. She  Has been referred to Dr. Lucky Cowboy. She states that today, she awoke with severe, 10/10, cramping, epigastric abd pain. This is somewhat different and more severe than her usual pain. She's had associated nausea nad has been unable to eat, as this makes the pain worse. No vomiting. No diarrhea. No fever, chills. No alleviating factors.         Past Medical History:  Diagnosis Date  . Aneurysm ([place]) 1980  . Cancer ([place])   . GERD (gastroesophageal reflux disease)   . Hypertension 1980  . Hypothyroidism 1999  . Pneumonia   . Pulmonary embolism ([place])   . Shingles   . Wears dentures    full upper and lower    Patient Active Problem List   Diagnosis Date Noted  . Mesenteric ischemia ([place]) 07/17/2020  . History of pulmonary embolism 04/29/2020  . Anemia in chronic kidney disease 04/23/2020  . Secondary hyperparathyroidism of renal origin ([place]) 04/23/2020  . Urinary tract infection 04/23/2020  . Benign breast lumps 02/23/2020  . Brain aneurysm 02/23/2020  . Hemorrhoids 02/23/2020  . Left wrist tendonitis 02/23/2020  . Menopausal symptoms 02/23/2020  . Renal mass 02/23/2020  . Hypercalcemia 12/18/2019  . Hypertensive chronic kidney disease with stage 1 through stage 4 chronic kidney disease, or unspecified chronic kidney disease 12/18/2019  .  Hypokalemia 12/18/2019  . Stage 3b chronic kidney disease 12/18/2019  . Cyst of left ovary 04/04/2019  . Postmenopausal bleeding 04/04/2019  . Endometrial thickening on ultrasound 04/04/2019  . Pulmonary embolism ([place])   . GERD (gastroesophageal reflux disease) 08/07/2018  . Hypertension 08/07/2018  . Shingles 08/07/2018  . Thyroid disease 08/07/2018  . HCAP (healthcare-associated pneumonia) 07/27/2018  . Obesity (BMI 30-39.9) 06/21/2018    Past Surgical History:  Procedure Laterality Date  . BRAIN SURGERY     aneurysm  . BREAST CYST ASPIRATION Left 2005  . BREAST EXCISIONAL BIOPSY Left 2013   atypical ductal hyperplasia  . BREAST SURGERY  2013   LF Breast Wide EXC   . CHOLECYSTECTOMY  2004  . COLONOSCOPY    . COLONOSCOPY WITH PROPOFOL N/A 01/31/2018   Procedure: COLONOSCOPY WITH PROPOFOL;  Surgeon: Lollie Sails, MD;  Location: [facility] ENDOSCOPY;  Service: Endoscopy;  Laterality: N/A;  . DILATION AND CURETTAGE OF UTERUS N/A 05/03/2019   Procedure: DILATATION AND CURETTAGE;  Surgeon: Gae Dry, MD;  Location: [facility] ORS;  Service: Gynecology;  Laterality: N/A;  . ESOPHAGOGASTRODUODENOSCOPY N/A 01/31/2018   Procedure: ESOPHAGOGASTRODUODENOSCOPY (EGD);  Surgeon: Lollie Sails, MD;  Location: [facility] ENDOSCOPY;  Service: Endoscopy;  Laterality: N/A;  . SHOULDER ARTHROSCOPY Left 07/25/2018   Procedure: SHOULDER MINI OPEN ROTATOR CUFF REPAIR  BICEPS TENDOSIS ARTHROSCOPIC DISTAL CLAVICLE EXCISION  SUBACROMIAL DECOMP;  Surgeon: Leim Fabry, MD;  Location: [place];  Service: Orthopedics;  Laterality: Left;  [place]  NEPHEW HEAD COIL ANCHOR FOOTPRINT ANCHOR QFIX ANCHOR    Prior to Admission medications   Medication Sig Start Date End Date Taking? Authorizing Provider  acetaminophen (TYLENOL) 500 MG tablet Take 1 tablet by mouth every 6 (six) hours as needed.   Yes [provider]  amLODipine (NORVASC) 5 MG tablet Take 5 mg by mouth  daily.  02/07/13  Yes [provider]  apixaban (ELIQUIS) 5 MG TABS tablet Take 5 mg by mouth 2 (two) times daily.   Yes [provider]  citalopram (CELEXA) 20 MG tablet Take 20 mg by mouth daily. 04/23/19  Yes [provider]  hydrochlorothiazide (HYDRODIURIL) 25 MG tablet Take 25 mg by mouth daily.  01/25/13  Yes [provider]  lactulose (CHRONULAC) 10 GM/15ML solution Take 30 mLs (20 g total) by mouth daily as needed for mild constipation. 03/09/20  Yes Paulette Blanch, MD  levothyroxine (SYNTHROID, LEVOTHROID) 75 MCG tablet Take 75 mcg by mouth daily.   Yes [provider]  linaclotide Rolan Lipa) 145 MCG CAPS capsule Take 1 capsule (145 mcg total) by mouth daily before breakfast. 07/07/20 08/06/20 Yes Virgel Manifold, MD  losartan (COZAAR) 50 MG tablet Take 50 mg by mouth daily.    Yes [provider]  omeprazole (PRILOSEC) 40 MG capsule Take 1 capsule (40 mg total) by mouth in the morning and at bedtime. 07/07/20  Yes Tahiliani, Margretta Sidle B, MD  potassium chloride (KLOR-CON) 10 MEQ tablet Take 10 mEq by mouth 2 (two) times daily. 06/17/20  Yes [provider]  sucralfate (CARAFATE) 1 g tablet Take 1 tablet (1 g total) by mouth 4 (four) times daily. 06/09/20  Yes Carrie Mew, MD  triamterene-hydrochlorothiazide (MAXZIDE-25) 37.5-25 MG tablet Take 1 tablet by mouth daily. 07/10/20  Yes [provider]    Allergies Bee venom, Shellfish allergy, Tamoxifen, and Penicillins  Family History  Problem Relation Age of Onset  . Breast cancer Neg Hx     Social History Social History   Tobacco Use  . Smoking status: Former Smoker    Packs/day: 1.50    Years: 20.00    Pack years: 30.00    Quit date: 1990    Years since quitting: 31.6  . Smokeless tobacco: Never Used  Vaping Use  . Vaping Use: Never used  Substance Use Topics  . Alcohol use: No  . Drug use: No    Review of Systems  Review of Systems  Constitutional:  Positive for fatigue. Negative for fever.  HENT: Negative for congestion and sore throat.   Eyes: Negative for visual disturbance.  Respiratory: Negative for cough and shortness of breath.   Cardiovascular: Negative for chest pain.  Gastrointestinal: Positive for abdominal pain and nausea. Negative for diarrhea and vomiting.  Genitourinary: Negative for flank pain.  Musculoskeletal: Negative for back pain and neck pain.  Skin: Negative for rash and wound.  Neurological: Positive for weakness.  All other systems reviewed and are negative.    ____________________________________________  PHYSICAL EXAM:      VITAL SIGNS: ED Triage Vitals  Enc Vitals Group     BP 07/17/20 1447 (!) 142/58     Pulse Rate 07/17/20 1447 65     Resp 07/17/20 1447 16     Temp 07/17/20 1447 98.4 F (36.9 C)     Temp Source 07/17/20 1447 Oral     SpO2 07/17/20 1447 96 %     Weight 07/17/20 1457 202 lb (91.6 kg)  Height 07/17/20 1457 5' (1.524 m)     Head Circumference --      Peak Flow --      Pain Score 07/17/20 1456 8     Pain Loc --      Pain Edu? --      Excl. in [place]? --      Physical Exam Vitals and nursing note reviewed.  Constitutional:      General: She is not in acute distress.    Appearance: She is well-developed.     Comments: Tearful, in pain  HENT:     Head: Normocephalic and atraumatic.  Eyes:     Conjunctiva/sclera: Conjunctivae normal.  Cardiovascular:     Rate and Rhythm: Normal rate and regular rhythm.     Heart sounds: Normal heart sounds. No murmur heard.  No friction rub.  Pulmonary:     Effort: Pulmonary effort is normal. No respiratory distress.     Breath sounds: Normal breath sounds. No wheezing or rales.  Abdominal:     General: There is no distension.     Palpations: Abdomen is soft.     Tenderness: There is abdominal tenderness in the epigastric area.  Musculoskeletal:     Cervical back: Neck supple.  Skin:    General: Skin is warm.     Capillary  Refill: Capillary refill takes less than 2 seconds.  Neurological:     Mental Status: She is alert and oriented to person, place, and time.     Motor: No abnormal muscle tone.       ____________________________________________   LABS (all labs ordered are listed, but only abnormal results are displayed)  Labs Reviewed  LIPASE, BLOOD - Abnormal; Notable for the following components:      Result Value   Lipase 57 (*)    All other components within normal limits  COMPREHENSIVE METABOLIC PANEL - Abnormal; Notable for the following components:   Potassium 3.3 (*)    Creatinine, Ser 1.54 (*)    Calcium 10.4 (*)    GFR calc non Af Amer 32 (*)    GFR calc Af Amer 37 (*)    All other components within normal limits  URINALYSIS, COMPLETE (UACMP) WITH MICROSCOPIC - Abnormal; Notable for the following components:   Color, Urine STRAW (*)    APPearance CLEAR (*)    Leukocytes,Ua TRACE (*)    All other components within normal limits  CBC WITH DIFFERENTIAL/PLATELET - Abnormal; Notable for the following components:   Hemoglobin 10.9 (*)    HCT 33.1 (*)    RDW 15.7 (*)    All other components within normal limits  SARS CORONAVIRUS 2 BY RT PCR (HOSPITAL ORDER, [facility])  LACTIC ACID, PLASMA  BASIC METABOLIC PANEL  CBC  TSH    ____________________________________________  EKG: Normal sinus rhythm, VR 68. PR 142, QRS 78, QTc 416. No acute St elevations, nonspecific ST and T wave abnormality ________________________________________  RADIOLOGY All imaging, including plain films, CT scans, and ultrasounds, independently reviewed by me, and interpretations confirmed via formal radiology reads.  ED MD interpretation:   CT Angio: >50% narrowing of celiac, no ischemic changes  Official radiology report(s): CT Angio Abd/Pel W and/or Wo Contrast  Result Date: 07/17/2020 CLINICAL DATA:  Abdominal pain EXAM: CTA ABDOMEN AND PELVIS WITHOUT AND WITH CONTRAST  TECHNIQUE: Multidetector CT imaging of the abdomen and pelvis was performed using the standard protocol during bolus administration of intravenous contrast. Multiplanar reconstructed images and  MIPs were obtained and reviewed to evaluate the vascular anatomy. CONTRAST:  30mL OMNIPAQUE IOHEXOL 350 MG/ML SOLN COMPARISON:  Ultrasound 07/14/2020.  CT 03/08/2020 FINDINGS: VASCULAR Aorta: Aortic atherosclerosis.  No aneurysm or dissection. Celiac: Mild-to-moderate stenosis just beyond the ostium, likely greater than 50%. SMA: Widely patent. Renals: Patent. Probable mild narrowing at the origin of the left renal artery. Right renal artery widely patent. IMA: Patent.  No visible stenosis. Inflow: Atherosclerosis. No aneurysm or significant stenosis. No dissection. Proximal Outflow: Atherosclerosis. No aneurysm, dissection or significant stenosis. Veins: No obvious venous abnormality within the limitations of this arterial phase study. Review of the MIP images confirms the above findings. NON-VASCULAR Lower chest: No acute abnormality. Coronary artery disease and aortic atherosclerosis. Hepatobiliary: Scattered small hypodensities in the liver compatible with cysts. Prior cholecystectomy. Pancreas: No focal abnormality or ductal dilatation. Spleen: No focal abnormality.  Normal size. Adrenals/Urinary Tract: Bilateral renal cysts. No suspicious renal or adrenal lesion. No stones or hydronephrosis. Urinary bladder unremarkable. Stomach/Bowel: Normal appendix. Stomach, large and small bowel grossly unremarkable. Lymphatic: No adenopathy Reproductive: Small left ovarian cyst measuring 2.5 cm, stable since prior study. Uterus and right adnexa unremarkable. Other: No free fluid or free air. Musculoskeletal: No acute bony abnormality. IMPRESSION: VASCULAR Aortoiliac atherosclerosis.  No aneurysm or dissection. Mild to moderate narrowing just beyond the origin of the celiac artery, likely 50% or greater. No other mesenteric  stenosis. Suspect mild stenosis at the origin of the left renal artery. NON-VASCULAR Hepatic and renal cysts. No bowel changes of ischemia or inflammation. No acute findings. Electronically Signed   By: Rolm Baptise M.D.   On: 07/17/2020 22:21    ____________________________________________  PROCEDURES   Procedure(s) performed (including Critical Care):  Procedures  ____________________________________________  INITIAL IMPRESSION / MDM / [place] / ED COURSE  As part of my medical decision making, I reviewed the following data within the [place] notes reviewed and incorporated, Old chart reviewed, Notes from prior ED visits, and [place] Controlled Substance Database       *TISHANA CLINKENBEARD was evaluated in Emergency Department on 07/18/2020 for the symptoms described in the history of present illness. She was evaluated in the context of the global COVID-19 pandemic, which necessitated consideration that the patient might be at risk for infection with the SARS-CoV-2 virus that causes COVID-19. Institutional protocols and algorithms that pertain to the evaluation of patients at risk for COVID-19 are in a state of rapid change based on information released by regulatory bodies including the CDC and federal and state organizations. These policies and algorithms were followed during the patient's care in the ED.  Some ED evaluations and interventions may be delayed as a result of limited staffing during the pandemic.*     Medical Decision Making:  79 yo F here with epigastric abdominal pain. Pt has had fairly extensive recent work-up with Dr. Enriqueta Shutter, and is being referred to Dr. Lucky Cowboy for possible vascular etiology. Labs today are overall reassuring - baseline WBC, Hgb, renal function, and LFTs. Lipase minimally elevated but doubt pancreatitis. Reviewed recent U/S - question of increased flow in SMA. Will check CT Angio for further assessment.  CT Angio shows  stenosis at celiac origin. Doubtful this is the primary cause of her pain. Discussed with Dr. Lucky Cowboy who reviewed, agrees. That being said, pt has ongoing, significant pain in ED despite multiple doses of analgesia. Will admit for pain control, hydration, and further evaluation.  ____________________________________________  FINAL CLINICAL IMPRESSION(S) /  ED DIAGNOSES  Final diagnoses:  Intractable abdominal pain     MEDICATIONS GIVEN DURING THIS VISIT:  Medications  0.9 %  sodium chloride infusion (has no administration in time range)  amLODipine (NORVASC) tablet 5 mg (has no administration in time range)  hydrochlorothiazide (HYDRODIURIL) tablet 25 mg (has no administration in time range)  losartan (COZAAR) tablet 50 mg (has no administration in time range)  citalopram (CELEXA) tablet 20 mg (has no administration in time range)  levothyroxine (SYNTHROID) tablet 75 mcg (has no administration in time range)  lactulose (CHRONULAC) 10 GM/15ML solution 20 g (has no administration in time range)  linaclotide (LINZESS) capsule 145 mcg (has no administration in time range)  pantoprazole (PROTONIX) EC tablet 40 mg (has no administration in time range)  sucralfate (CARAFATE) tablet 1 g (has no administration in time range)  apixaban (ELIQUIS) tablet 5 mg (has no administration in time range)  potassium chloride SA (KLOR-CON) CR tablet 20 mEq (has no administration in time range)  0.9 %  sodium chloride infusion (has no administration in time range)  acetaminophen (TYLENOL) tablet 650 mg (has no administration in time range)    Or  acetaminophen (TYLENOL) suppository 650 mg (has no administration in time range)  magnesium hydroxide (MILK OF MAGNESIA) suspension 30 mL (has no administration in time range)  ondansetron (ZOFRAN) tablet 4 mg (has no administration in time range)    Or  ondansetron (ZOFRAN) injection 4 mg (has no administration in time range)  morphine 2 MG/ML injection 2 mg (has no  administration in time range)  morphine 4 MG/ML injection 4 mg (4 mg Intravenous Given 07/17/20 2149)  ondansetron (ZOFRAN) injection 4 mg (4 mg Intravenous Given 07/17/20 2148)  iohexol (OMNIPAQUE) 350 MG/ML injection 75 mL (75 mLs Intravenous Contrast Given 07/17/20 2158)  morphine 4 MG/ML injection 6 mg (6 mg Intravenous Given 07/17/20 2239)  sodium chloride 0.9 % bolus 1,000 mL (1,000 mLs Intravenous New Bag/Given 07/17/20 2346)     ED Discharge Orders    None       Note:  This document was prepared using Dragon voice recognition software and may include unintentional dictation errors.   Duffy Bruce, MD 07/18/20 [phone]

## 2020-07-18 DIAGNOSIS — R109 Unspecified abdominal pain: Secondary | ICD-10-CM

## 2020-07-18 DIAGNOSIS — K581 Irritable bowel syndrome with constipation: Secondary | ICD-10-CM

## 2020-07-18 LAB — CBC
HCT: 31.1 % — ABNORMAL LOW (ref 36.0–46.0)
Hemoglobin: 10.1 g/dL — ABNORMAL LOW (ref 12.0–15.0)
MCH: 27.7 pg (ref 26.0–34.0)
MCHC: 32.5 g/dL (ref 30.0–36.0)
MCV: 85.4 fL (ref 80.0–100.0)
Platelets: 296 10*3/uL (ref 150–400)
RBC: 3.64 MIL/uL — ABNORMAL LOW (ref 3.87–5.11)
RDW: 15.7 % — ABNORMAL HIGH (ref 11.5–15.5)
WBC: 9.2 10*3/uL (ref 4.0–10.5)
nRBC: 0 % (ref 0.0–0.2)

## 2020-07-18 LAB — TSH: TSH: 5.675 u[IU]/mL — ABNORMAL HIGH (ref 0.350–4.500)

## 2020-07-18 LAB — BASIC METABOLIC PANEL
Anion gap: 12 (ref 5–15)
BUN: 17 mg/dL (ref 8–23)
CO2: 24 mmol/L (ref 22–32)
Calcium: 9.6 mg/dL (ref 8.9–10.3)
Chloride: 104 mmol/L (ref 98–111)
Creatinine, Ser: 1.58 mg/dL — ABNORMAL HIGH (ref 0.44–1.00)
GFR calc Af Amer: 36 mL/min — ABNORMAL LOW (ref >60)
GFR calc non Af Amer: 31 mL/min — ABNORMAL LOW (ref >60)
Glucose, Bld: 104 mg/dL — ABNORMAL HIGH (ref 70–99)
Potassium: 4 mmol/L (ref 3.5–5.1)
Sodium: 140 mmol/L (ref 135–145)

## 2020-07-18 LAB — SARS CORONAVIRUS 2 BY RT PCR (HOSPITAL ORDER, PERFORMED IN ~~LOC~~ HOSPITAL LAB): SARS Coronavirus 2: NEGATIVE

## 2020-07-18 MED ORDER — AMLODIPINE BESYLATE 5 MG PO TABS
5.0000 mg | ORAL_TABLET | Freq: Every day | ORAL | Status: DC
  Administered 2020-07-18 – 2020-07-20 (×2): 5 mg via ORAL
  Filled 2020-07-18 (×3): qty 1

## 2020-07-18 MED ORDER — ONDANSETRON HCL 4 MG PO TABS: 4.0000 mg | ORAL_TABLET | Freq: Four times a day (QID) | ORAL | Status: DC | PRN

## 2020-07-18 MED ORDER — CITALOPRAM HYDROBROMIDE 20 MG PO TABS
20.0000 mg | ORAL_TABLET | Freq: Every day | ORAL | Status: DC
  Administered 2020-07-18 – 2020-07-20 (×3): 20 mg via ORAL
  Filled 2020-07-18 (×3): qty 1

## 2020-07-18 MED ORDER — LOSARTAN POTASSIUM 50 MG PO TABS
50.0000 mg | ORAL_TABLET | Freq: Every day | ORAL | Status: DC
  Administered 2020-07-18 – 2020-07-20 (×2): 50 mg via ORAL
  Filled 2020-07-18 (×3): qty 1

## 2020-07-18 MED ORDER — MAGNESIUM HYDROXIDE 400 MG/5ML PO SUSP: 30.0000 mL | Freq: Every day | ORAL | Status: DC | PRN

## 2020-07-18 MED ORDER — LINACLOTIDE 145 MCG PO CAPS
145.0000 ug | ORAL_CAPSULE | Freq: Every day | ORAL | Status: DC
  Administered 2020-07-18 – 2020-07-20 (×3): 145 ug via ORAL
  Filled 2020-07-18 (×4): qty 1

## 2020-07-18 MED ORDER — ACETAMINOPHEN 650 MG RE SUPP: 650.0000 mg | Freq: Four times a day (QID) | RECTAL | Status: DC | PRN

## 2020-07-18 MED ORDER — ONDANSETRON HCL 4 MG/2ML IJ SOLN
4.0000 mg | Freq: Four times a day (QID) | INTRAMUSCULAR | Status: DC | PRN
  Administered 2020-07-19: 4 mg via INTRAVENOUS
  Filled 2020-07-18: qty 2

## 2020-07-18 MED ORDER — TRAZODONE HCL 50 MG PO TABS: 25.0000 mg | ORAL_TABLET | Freq: Every evening | ORAL | Status: DC | PRN

## 2020-07-18 MED ORDER — MORPHINE SULFATE (PF) 2 MG/ML IV SOLN
2.0000 mg | INTRAVENOUS | Status: DC | PRN
  Administered 2020-07-19 (×2): 2 mg via INTRAVENOUS
  Filled 2020-07-18 (×2): qty 1

## 2020-07-18 MED ORDER — SUCRALFATE 1 G PO TABS
1.0000 g | ORAL_TABLET | Freq: Four times a day (QID) | ORAL | Status: DC
  Administered 2020-07-18 – 2020-07-20 (×9): 1 g via ORAL
  Filled 2020-07-18 (×9): qty 1

## 2020-07-18 MED ORDER — ACETAMINOPHEN 325 MG PO TABS: 650.0000 mg | ORAL_TABLET | Freq: Four times a day (QID) | ORAL | Status: DC | PRN

## 2020-07-18 MED ORDER — APIXABAN 5 MG PO TABS
5.0000 mg | ORAL_TABLET | Freq: Two times a day (BID) | ORAL | Status: DC
  Administered 2020-07-18 – 2020-07-20 (×6): 5 mg via ORAL
  Filled 2020-07-18 (×6): qty 1

## 2020-07-18 MED ORDER — SODIUM CHLORIDE 0.9 % IV SOLN: INTRAVENOUS | Status: DC

## 2020-07-18 MED ORDER — LACTULOSE 10 GM/15ML PO SOLN: 20.0000 g | Freq: Every day | ORAL | Status: DC | PRN

## 2020-07-18 MED ORDER — HYDROCHLOROTHIAZIDE 25 MG PO TABS
25.0000 mg | ORAL_TABLET | Freq: Every day | ORAL | Status: DC
  Administered 2020-07-18: 25 mg via ORAL
  Filled 2020-07-18: qty 1

## 2020-07-18 MED ORDER — POTASSIUM CHLORIDE CRYS ER 20 MEQ PO TBCR
20.0000 meq | EXTENDED_RELEASE_TABLET | Freq: Every day | ORAL | Status: DC
  Administered 2020-07-18 – 2020-07-20 (×3): 20 meq via ORAL
  Filled 2020-07-18 (×3): qty 1

## 2020-07-18 MED ORDER — ONDANSETRON 4 MG PO TBDP: 4.0000 mg | ORAL_TABLET | Freq: Three times a day (TID) | ORAL | Status: DC | PRN

## 2020-07-18 MED ORDER — PANTOPRAZOLE SODIUM 40 MG PO TBEC
40.0000 mg | DELAYED_RELEASE_TABLET | Freq: Every day | ORAL | Status: DC
  Administered 2020-07-18 – 2020-07-20 (×3): 40 mg via ORAL
  Filled 2020-07-18 (×3): qty 1

## 2020-07-18 MED ORDER — LEVOTHYROXINE SODIUM 50 MCG PO TABS
75.0000 ug | ORAL_TABLET | Freq: Every day | ORAL | Status: DC
  Administered 2020-07-18 – 2020-07-20 (×3): 75 ug via ORAL
  Filled 2020-07-18 (×3): qty 2

## 2020-07-18 NOTE — ED Notes (Signed)
Patient daughter at bedside.

## 2020-07-18 NOTE — Treatment Plan (Addendum)
Pt to unit at this time via bed. Pt is AxOx4. Pt able to ambulate with contact guard to bathroom, voided, then changed into gown. Daughter at bedside. Pt states her pain is 5/10 in her abdomen, didn't want pain meds at this time. Pt VSS taken by RN. Tele called and placed on monitor X01. Tia at bedside, skin checked, no issues noted. Pt and daughter oriented to unit and how to call for help at this time, pt takes pills whole, carafate given. Weight obtained and charted. IV dressing was saturated and this writer changed dressing upon arrival on floor. NS started at 78ml/hour. This Probation officer completed admission.  Will continue to monitor .

## 2020-07-18 NOTE — Consult Note (Signed)
[facility] Vascular Consult Note  MRN : [id number]  ELAINAH RHYNE is a 79 y.o. (02/06/1941) female who presents with chief complaint of  Chief Complaint  Patient presents with  . Abdominal Pain   History of Present Illness:  Charlynn Salih  is a 79 year old female with a known history of hypertension, hypothyroidism, GERD and PE, who presented to the the emergency room with acute onset of upper abdominal pain with associated nausea without vomiting.    The patient endorses a history of  black stools for a year but denied any bright red bleeding per rectum.  Patient states her pain radiates from the right to left side of her abdomen.  Denies any postprandial pain or recent weight loss. No fever or chills.  No chest pain or dyspnea or palpitations.  No cough or wheezing or hemoptysis.    Even though triage notation mentions our office encouraged the patient to seek medical attention in the emergency department based on an ultrasound completed - we have actually never seen the patient / ordered any tests on her.  CTA Abdomen / Pelvis (07/17/20): 1) Aortoiliac atherosclerosis.  No aneurysm or dissection. 2) Mild to moderate narrowing just beyond the origin of the celiac artery, likely 50% or greater. No other mesenteric stenosis. 3)Suspect mild stenosis at the origin of the left renal artery.  Vascular surgery was consulted by Dr. Jimmye Norman for possible endovascular intervention.  Current Facility-Administered Medications  Medication Dose Route Frequency Provider Last Rate Last Admin  . 0.9 %  sodium chloride infusion   Intravenous Continuous Wyvonnia Dusky, MD      . acetaminophen (TYLENOL) tablet 650 mg  650 mg Oral Q6H PRN Mansy, Jan A, MD       Or  . acetaminophen (TYLENOL) suppository 650 mg  650 mg Rectal Q6H PRN Mansy, Jan A, MD      . amLODipine (NORVASC) tablet 5 mg  5 mg Oral Daily Mansy, Jan A, MD   5 mg at 07/18/20 1022  . apixaban (ELIQUIS) tablet  5 mg  5 mg Oral BID Mansy, Jan A, MD   5 mg at 07/18/20 1024  . citalopram (CELEXA) tablet 20 mg  20 mg Oral Daily Mansy, Jan A, MD   20 mg at 07/18/20 1023  . hydrochlorothiazide (HYDRODIURIL) tablet 25 mg  25 mg Oral Daily Mansy, Jan A, MD   25 mg at 07/18/20 1022  . lactulose (CHRONULAC) 10 GM/15ML solution 20 g  20 g Oral Daily PRN Mansy, Jan A, MD      . levothyroxine (SYNTHROID) tablet 75 mcg  75 mcg Oral Daily Mansy, Jan A, MD   75 mcg at 07/18/20 0645  . linaclotide (LINZESS) capsule 145 mcg  145 mcg Oral QAC breakfast Mansy, Arvella Merles, MD   145 mcg at 07/18/20 0829  . losartan (COZAAR) tablet 50 mg  50 mg Oral Daily Mansy, Jan A, MD   50 mg at 07/18/20 1022  . magnesium hydroxide (MILK OF MAGNESIA) suspension 30 mL  30 mL Oral Daily PRN Mansy, Jan A, MD      . morphine 2 MG/ML injection 2 mg  2 mg Intravenous Q4H PRN Mansy, Jan A, MD      . ondansetron [facility]) tablet 4 mg  4 mg Oral Q6H PRN Mansy, Jan A, MD       Or  . ondansetron [facility]) injection 4 mg  4 mg Intravenous Q6H PRN Mansy, Arvella Merles, MD      .  pantoprazole (PROTONIX) EC tablet 40 mg  40 mg Oral Daily Mansy, Jan A, MD   40 mg at 07/18/20 1023  . potassium chloride SA (KLOR-CON) CR tablet 20 mEq  20 mEq Oral Daily Mansy, Jan A, MD   20 mEq at 07/18/20 1024  . sucralfate (CARAFATE) tablet 1 g  1 g Oral QID Mansy, Jan A, MD   1 g at 07/18/20 1336   Current Outpatient Medications  Medication Sig Dispense Refill  . acetaminophen (TYLENOL) 500 MG tablet Take 1 tablet by mouth every 6 (six) hours as needed.    Marland Kitchen amLODipine (NORVASC) 5 MG tablet Take 5 mg by mouth daily.     Marland Kitchen apixaban (ELIQUIS) 5 MG TABS tablet Take 5 mg by mouth 2 (two) times daily.    . citalopram (CELEXA) 20 MG tablet Take 20 mg by mouth daily.    . hydrochlorothiazide (HYDRODIURIL) 25 MG tablet Take 25 mg by mouth daily.     Marland Kitchen lactulose (CHRONULAC) 10 GM/15ML solution Take 30 mLs (20 g total) by mouth daily as needed for mild constipation. 120 mL 0  .  levothyroxine (SYNTHROID, LEVOTHROID) 75 MCG tablet Take 75 mcg by mouth daily.    Marland Kitchen linaclotide (LINZESS) 145 MCG CAPS capsule Take 1 capsule (145 mcg total) by mouth daily before breakfast. 30 capsule 1  . losartan (COZAAR) 50 MG tablet Take 50 mg by mouth daily.     Marland Kitchen omeprazole (PRILOSEC) 40 MG capsule Take 1 capsule (40 mg total) by mouth in the morning and at bedtime. 60 capsule 0  . potassium chloride (KLOR-CON) 10 MEQ tablet Take 10 mEq by mouth 2 (two) times daily.    . sucralfate (CARAFATE) 1 g tablet Take 1 tablet (1 g total) by mouth 4 (four) times daily. 120 tablet 1  . triamterene-hydrochlorothiazide (MAXZIDE-25) 37.5-25 MG tablet Take 1 tablet by mouth daily.     Past Medical History:  Diagnosis Date  . Aneurysm ([place]) 1980  . Cancer ([place])   . GERD (gastroesophageal reflux disease)   . Hypertension 1980  . Hypothyroidism 1999  . Pneumonia   . Pulmonary embolism ([place])   . Shingles   . Wears dentures    full upper and lower   Past Surgical History:  Procedure Laterality Date  . BRAIN SURGERY     aneurysm  . BREAST CYST ASPIRATION Left 2005  . BREAST EXCISIONAL BIOPSY Left 2013   atypical ductal hyperplasia  . BREAST SURGERY  2013   LF Breast Wide EXC   . CHOLECYSTECTOMY  2004  . COLONOSCOPY    . COLONOSCOPY WITH PROPOFOL N/A 01/31/2018   Procedure: COLONOSCOPY WITH PROPOFOL;  Surgeon: Lollie Sails, MD;  Location: [facility] ENDOSCOPY;  Service: Endoscopy;  Laterality: N/A;  . DILATION AND CURETTAGE OF UTERUS N/A 05/03/2019   Procedure: DILATATION AND CURETTAGE;  Surgeon: Gae Dry, MD;  Location: [facility] ORS;  Service: Gynecology;  Laterality: N/A;  . ESOPHAGOGASTRODUODENOSCOPY N/A 01/31/2018   Procedure: ESOPHAGOGASTRODUODENOSCOPY (EGD);  Surgeon: Lollie Sails, MD;  Location: [facility] ENDOSCOPY;  Service: Endoscopy;  Laterality: N/A;  . 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 Smoking  status: Former Smoker    Packs/day: 1.50    Years: 20.00    Pack years: 30.00    Quit date: 1990    Years since quitting: 31.6  . Smokeless tobacco: Never Used  Vaping Use  . Vaping Use: Never used  Substance Use Topics  . Alcohol use: No  . Drug use: No   Family History Family History  Problem Relation Age of Onset  . Breast cancer Neg Hx   Denies family history of peripheral artery disease, venous disease or renal disease.  Allergies  Allergen Reactions  . Bee Venom Swelling  . Shellfish Allergy Swelling  . Tamoxifen Hives  . Penicillins Hives, Rash and Other (See Comments)    Has patient had a PCN reaction causing immediate rash, facial/tongue/throat swelling, SOB or lightheadedness with hypotension: Unknown Has patient had a PCN reaction causing severe rash involving mucus membranes or skin necrosis: Unknown Has patient had a PCN reaction that required hospitalization: Unknown Has patient had a PCN reaction occurring within the last 10 years: No If all of the above answers are "NO", then may proceed with Cephalosporin use.    REVIEW OF SYSTEMS (Negative unless checked)  Constitutional: [] Weight loss  [] Fever  [] Chills Cardiac: [] Chest pain   [] Chest pressure   [] Palpitations   [] Shortness of breath when laying flat   [] Shortness of breath at rest   [] Shortness of breath with exertion. Vascular:  [] Pain in legs with walking   [] Pain in legs at rest   [] Pain in legs when laying flat   [] Claudication   [] Pain in feet when walking  [] Pain in feet at rest  [] Pain in feet when laying flat   [] History of DVT   [] Phlebitis   [] Swelling in legs   [] Varicose veins   [] Non-healing ulcers Pulmonary:    [] Uses home oxygen   [] Productive cough   [] Hemoptysis   [] Wheeze  [] COPD   [] Asthma Neurologic:  [] Dizziness  [] Blackouts   [] Seizures   [] History of stroke   [] History of TIA  [] Aphasia   [] Temporary blindness   [] Dysphagia   [] Weakness or numbness in arms   [] Weakness or numbness in legs Musculoskeletal:  [] Arthritis   [] Joint swelling   [] Joint pain   [] Low back pain Hematologic:  [] Easy bruising  [] Easy bleeding   [] Hypercoagulable state   [] Anemic  [] Hepatitis Gastrointestinal:  [] Blood in stool   [] Vomiting blood  [] Gastroesophageal reflux/heartburn   [] Difficulty swallowing. Genitourinary:  [] Chronic kidney disease   [] Difficult urination  [] Frequent urination  [] Burning with urination   [] Blood in urine Skin:  [] Rashes   [] Ulcers   [] Wounds Psychological:  [] History of anxiety   []  History of major depression.  Positive for abdominal pain, dark stools and nausea vomiting.  Physical Examination  Vitals:   07/18/20 0900 07/18/20 1022 07/18/20 1100 07/18/20 1130  BP: 111/67 (!) 128/56 (!) 124/44 (!) 107/49  Pulse: 61  60 (!) 56  Resp: 10   15  Temp:      TempSrc:      SpO2: 96%  95% 93%  Weight:      Height:       Body mass index is 39.45 kg/m. Gen:  WD/WN, NAD Head: [place]/AT, No temporalis wasting. Prominent temp pulse not noted. Ear/Nose/Throat: Hearing grossly intact, nares w/o erythema or drainage, oropharynx w/o Erythema/Exudate Eyes: Sclera non-icteric, conjunctiva clear Neck: Trachea midline.  No JVD.  Pulmonary:  Good air movement, respirations not labored, equal bilaterally.  Cardiac: RRR, normal S1,  S2. Vascular:  Vessel Right Left  Radial Palpable Palpable  Ulnar Palpable Palpable  Brachial Palpable Palpable  Carotid Palpable, without bruit Palpable, without bruit  Aorta Not palpable N/A  Femoral Palpable Palpable  Popliteal Palpable Palpable  PT Palpable Palpable  DP Palpable Palpable   Gastrointestinal: soft, non-tender/non-distended. No guarding/reflex.   Musculoskeletal: M/S 5/5 throughout.  Extremities without ischemic changes.  No deformity or atrophy. No edema. Neurologic: Sensation grossly intact in extremities.  Symmetrical.  Speech is fluent. Motor exam as listed above. Psychiatric: Judgment intact, Mood & affect appropriate for pt's clinical situation. Dermatologic: No rashes or ulcers noted.  No cellulitis or open wounds. Lymph : No Cervical, Axillary, or Inguinal lymphadenopathy.  CBC Lab Results  Component Value Date   WBC 9.2 07/18/2020   HGB 10.1 (L) 07/18/2020   HCT 31.1 (L) 07/18/2020   MCV 85.4 07/18/2020   PLT 296 07/18/2020   BMET    Component Value Date/Time   NA 140 07/18/2020 0513   K 4.0 07/18/2020 0513   K 3.0 (L) 02/14/2014 1447   CL 104 07/18/2020 0513   CO2 24 07/18/2020 0513   GLUCOSE 104 (H) 07/18/2020 0513   BUN 17 07/18/2020 0513   CREATININE 1.58 (H) 07/18/2020 0513   CALCIUM 9.6 07/18/2020 0513   GFRNONAA 31 (L) 07/18/2020 0513   GFRAA 36 (L) 07/18/2020 0513   Estimated Creatinine Clearance: 29.6 mL/min (A) (by C-G formula based on SCr of 1.58 mg/dL (H)).  COAG Lab Results  Component Value Date   INR 1.0 04/30/2019   INR 1.02 07/27/2018   Radiology [place] MESENTERIC ARTERIES  Result Date: 07/14/2020 CLINICAL DATA:  79 year old female with pain after eating EXAM: [place] MESENTERIC ARTERIAL DOPPLER COMPARISON:  No prior duplex FINDINGS: Celiac axis: 181 cm/sec Celiac axis with inspiration: 181 cm/sec Celiac axis with expiration: 55 cm/sec Splenic artery: 104 cm/sec Hepatic artery: 122 cm/sec SMA: 109 cm/sec proximal, 409 centimeter/second mid segment. IMA: Not visualized Aorta: 46 cm/sec Aortic size: 2.2 cm proximally, 1.7 cm in the mid segment and 1.3 cm distally IMPRESSION: Directed duplex of the mesenteric arteries demonstrates no ostial stenosis of celiac artery or SMA. IMA not sampled. Elevated velocity in the mid segment of the SMA is recorded, though this may represent artifact. Signed, Dulcy Fanny.  Dellia Nims, RPVI [facility] Electronically Signed   By: Corrie Mckusick D.O.   On: 07/14/2020 11:25   CT Angio Abd/Pel W and/or Wo Contrast  Result Date: 07/17/2020 CLINICAL DATA:  Abdominal pain EXAM: CTA ABDOMEN AND PELVIS WITHOUT AND WITH CONTRAST TECHNIQUE: Multidetector CT imaging of the abdomen and pelvis was performed using the standard protocol during bolus administration of intravenous contrast. Multiplanar reconstructed images and MIPs were obtained and reviewed to evaluate the vascular anatomy. CONTRAST:  92mL OMNIPAQUE IOHEXOL 350 MG/ML SOLN COMPARISON:  Ultrasound 07/14/2020.  CT 03/08/2020 FINDINGS: VASCULAR Aorta: Aortic atherosclerosis.  No aneurysm or dissection. Celiac: Mild-to-moderate stenosis just beyond the ostium, likely greater than 50%. SMA: Widely patent. Renals: Patent. Probable mild narrowing at the origin of the left renal artery. Right renal artery widely patent. IMA: Patent.  No visible stenosis. Inflow: Atherosclerosis. No aneurysm or significant stenosis. No dissection. Proximal Outflow: Atherosclerosis. No aneurysm, dissection or significant stenosis. Veins: No obvious venous abnormality within the limitations of this arterial phase study. Review of the MIP images confirms the above findings. NON-VASCULAR Lower chest: No acute abnormality. Coronary artery disease and aortic atherosclerosis. Hepatobiliary: Scattered small hypodensities in the  liver compatible with cysts. Prior cholecystectomy. Pancreas: No focal abnormality or ductal dilatation. Spleen: No focal abnormality.  Normal size. Adrenals/Urinary Tract: Bilateral renal cysts. No suspicious renal or adrenal lesion. No stones or hydronephrosis. Urinary bladder unremarkable. Stomach/Bowel: Normal appendix. Stomach, large and small bowel grossly unremarkable. Lymphatic: No adenopathy Reproductive: Small left ovarian cyst measuring 2.5 cm, stable since prior study.  Uterus and right adnexa unremarkable. Other: No free fluid or free air. Musculoskeletal: No acute bony abnormality. IMPRESSION: VASCULAR Aortoiliac atherosclerosis.  No aneurysm or dissection. Mild to moderate narrowing just beyond the origin of the celiac artery, likely 50% or greater. No other mesenteric stenosis. Suspect mild stenosis at the origin of the left renal artery. NON-VASCULAR Hepatic and renal cysts. No bowel changes of ischemia or inflammation. No acute findings. Electronically Signed   By: Rolm Baptise M.D.   On: 07/17/2020 22:21   Assessment/Plan Matalyn Nawaz  is a 79 year old female with a known history of hypertension, hypothyroidism, GERD and PE, who presented to the the emergency room with acute onset of upper abdominal pain with associated nausea without vomiting.    1.  Abdominal pain: Patient presents to the [facility] emergency department with a chief complaint of abdominal pain and associated nausea and vomiting.  Patient denies any history of postprandial pain or recent weight loss.  She does note experiencing dark stools for approximately 1 year.  Reviewed CTA abdomen pelvis with Dr. Lucky Cowboy.  Minimal atherosclerotic disease noted to the celiac artery.  Patent SMA and IMA.  With minimal atherosclerotic disease seen on CTA highly doubt the patient's abdominal discomfort is from any mesenteric ischemia.  At this time, would not recommend any endovascular intervention.  2.  Chronic kidney disease: Encouraged hypertension control as this directly influences worsening kidney disease.  3.  Anemia of chronic disease: Asymptomatic at this time This is followed by the patient's primary care physician  Discussed with Dr. Mayme Genta, PA-C  07/18/2020 3:18 PM  This note was created with Dragon medical transcription system.  Any error is purely unintentional.

## 2020-07-18 NOTE — H&P (Signed)
[place]   PATIENT NAME: Latoya Freeman    MR#:  [id number]  DATE OF BIRTH:  1941/05/26  DATE OF ADMISSION:  07/17/2020  PRIMARY CARE PHYSICIAN: [name], MD   REQUESTING/REFERRING PHYSICIAN: Duffy Bruce, MD  CHIEF COMPLAINT:   Chief Complaint  Patient presents with  . Abdominal Pain    HISTORY OF PRESENT ILLNESS:  Latoya Freeman  is a 79 y.o. Caucasian female with a known history of hypertension, hypothyroidism, GERD and PE, who presented to the the emergency room with acute onset of upper abdominal pain with associated nausea without vomiting.  She has had black stools for a year but denied any bright red bleeding per rectum.  No fever or chills.  No chest pain or dyspnea or palpitations.  No cough or wheezing or hemoptysis.  No other bleeding diathesis.  Upon presentation to the emergency room, blood pressure was 142/58 with otherwise normal vital signs.  Labs revealed CMP with hypokalemia of 3.3 with a creatinine of 1.54 better than last month, lactic acid 1.2 and CBC showed anemia close to previous levels.  UA showed 6-10 WBCs and trace leukocytes.  Two-view chest x-ray showed no acute cardiopulmonary disease.  Showed minimal right basal atelectasis/scarring with no changes.EKG showed normal sinus rhythm with a rate of 68 with occasional PVCs with low voltage QRS. Abdominal and pelvic CTA revealed mild to moderate celiac stenosis just beyond the ostium, likely greater than 50% abdomen, hepatic and renal cysts with no bowel changes of ischemia or inflammation and no acute findings.  The patient was given 4 mg of IV morphine sulfate and later 6 mg IV, Zofran 4 mg IV, 1 L bolus of IV normal saline followed 100 mL/h.  She will be admitted to a medically monitored bed for further evaluation and management.    PAST MEDICAL HISTORY:   Past Medical History:  Diagnosis Date  . Aneurysm ([place]) 1980  . Cancer ([place])   . GERD (gastroesophageal reflux disease)   .  Hypertension 1980  . Hypothyroidism 1999  . Pneumonia   . Pulmonary embolism ([place])   . Shingles   . Wears dentures    full upper and lower    PAST SURGICAL HISTORY:   Past Surgical History:  Procedure Laterality Date  . BRAIN SURGERY     aneurysm  . BREAST CYST ASPIRATION Left 2005  . BREAST EXCISIONAL BIOPSY Left 2013   atypical ductal hyperplasia  . BREAST SURGERY  2013   LF Breast Wide EXC   . CHOLECYSTECTOMY  2004  . COLONOSCOPY    . COLONOSCOPY WITH PROPOFOL N/A 01/31/2018   Procedure: COLONOSCOPY WITH PROPOFOL;  Surgeon: Lollie Sails, MD;  Location: [facility] ENDOSCOPY;  Service: Endoscopy;  Laterality: N/A;  . DILATION AND CURETTAGE OF UTERUS N/A 05/03/2019   Procedure: DILATATION AND CURETTAGE;  Surgeon: Gae Dry, MD;  Location: [facility] ORS;  Service: Gynecology;  Laterality: N/A;  . ESOPHAGOGASTRODUODENOSCOPY N/A 01/31/2018   Procedure: ESOPHAGOGASTRODUODENOSCOPY (EGD);  Surgeon: Lollie Sails, MD;  Location: [facility] ENDOSCOPY;  Service: Endoscopy;  Laterality: N/A;  . SHOULDER ARTHROSCOPY Left 07/25/2018   Procedure: SHOULDER MINI OPEN ROTATOR CUFF REPAIR  BICEPS TENDOSIS ARTHROSCOPIC DISTAL CLAVICLE EXCISION  SUBACROMIAL DECOMP;  Surgeon: Leim Fabry, MD;  Location: [place];  Service: Orthopedics;  Laterality: Left;  BEACH CHAIR WITH SPYDER SMITH & NEPHEW HEAD COIL ANCHOR FOOTPRINT ANCHOR QFIX ANCHOR    SOCIAL HISTORY:   Social History   Tobacco Use  .  Smoking status: Former Smoker    Packs/day: 1.50    Years: 20.00    Pack years: 30.00    Quit date: 1990    Years since quitting: 31.6  . Smokeless tobacco: Never Used  Substance Use Topics  . Alcohol use: No    FAMILY HISTORY:   Family History  Problem Relation Age of Onset  . Breast cancer Neg Hx     DRUG ALLERGIES:   Allergies  Allergen Reactions  . Bee Venom Swelling  . Shellfish Allergy Swelling  . Tamoxifen Hives  . Penicillins Hives, Rash and Other (See Comments)    Has  patient had a PCN reaction causing immediate rash, facial/tongue/throat swelling, SOB or lightheadedness with hypotension: Unknown Has patient had a PCN reaction causing severe rash involving mucus membranes or skin necrosis: Unknown Has patient had a PCN reaction that required hospitalization: Unknown Has patient had a PCN reaction occurring within the last 10 years: No If all of the above answers are "NO", then may proceed with Cephalosporin use.     REVIEW OF SYSTEMS:   ROS As per history of present illness. All pertinent systems were reviewed above. Constitutional, HEENT, cardiovascular, respiratory, GI, GU, musculoskeletal, neuro, psychiatric, endocrine, integumentary and hematologic systems were reviewed and are otherwise negative/unremarkable except for positive findings mentioned above in the HPI.   MEDICATIONS AT HOME:   Prior to Admission medications   Medication Sig Start Date End Date Taking? Authorizing Provider  acetaminophen (TYLENOL) 500 MG tablet Take 1 tablet by mouth every 6 (six) hours as needed.    [provider]  amLODipine (NORVASC) 5 MG tablet Take 5 mg by mouth daily.  02/07/13   [provider]  apixaban (ELIQUIS) 5 MG TABS tablet Take 5 mg by mouth 2 (two) times daily.    [provider]  citalopram (CELEXA) 20 MG tablet Take 20 mg by mouth daily. 04/23/19   [provider]  hydrochlorothiazide (HYDRODIURIL) 25 MG tablet Take 25 mg by mouth daily.  01/25/13   [provider]  lactulose (CHRONULAC) 10 GM/15ML solution Take 30 mLs (20 g total) by mouth daily as needed for mild constipation. 03/09/20   Paulette Blanch, MD  levothyroxine (SYNTHROID, LEVOTHROID) 75 MCG tablet Take 75 mcg by mouth daily.    [provider]  linaclotide Rolan Lipa) 145 MCG CAPS capsule Take 1 capsule (145 mcg total) by mouth daily before breakfast. 07/07/20 08/06/20  Virgel Manifold, MD  losartan (COZAAR) 50 MG tablet Take 50 mg by mouth  daily.     [provider]  omeprazole (PRILOSEC) 40 MG capsule Take 1 capsule (40 mg total) by mouth in the morning and at bedtime. 07/07/20   Virgel Manifold, MD  ondansetron (ZOFRAN ODT) 4 MG disintegrating tablet Take 1 tablet (4 mg total) by mouth every 8 (eight) hours as needed for nausea or vomiting. Patient not taking: Reported on 07/07/2020 03/09/20   Paulette Blanch, MD  potassium chloride (K-DUR,KLOR-CON) 10 MEQ tablet Take 20 mEq by mouth daily. 07/12/18   [provider]  sucralfate (CARAFATE) 1 g tablet Take 1 tablet (1 g total) by mouth 4 (four) times daily. 06/09/20   Carrie Mew, MD      VITAL SIGNS:  Blood pressure (!) 112/45, pulse 65, temperature 98.4 F (36.9 C), temperature source Oral, resp. rate 16, height 5' (1.524 m), weight 91.6 kg, SpO2 95 %.  PHYSICAL EXAMINATION:  Physical Exam  GENERAL:  80 y.o.-year-old Caucasian female  patient lying in the bed with no acute distress.  EYES: Pupils equal, round, reactive to light and accommodation. No scleral icterus. Extraocular muscles intact.  HEENT: Head atraumatic, normocephalic. Oropharynx and nasopharynx clear.  NECK:  Supple, no jugular venous distention. No thyroid enlargement, no tenderness.  LUNGS: Normal breath sounds bilaterally, no wheezing, rales,rhonchi or crepitation. No use of accessory muscles of respiration.  CARDIOVASCULAR: Regular rate and rhythm, S1, S2 normal. No murmurs, rubs, or gallops.  ABDOMEN: Soft, nondistended, with epigastric, left upper quadrant and right upper quadrant mild tenderness without rebound tenderness guarding or rigidity.  Bowel sounds present. No organomegaly or mass.  EXTREMITIES: No pedal edema, cyanosis, or clubbing.  NEUROLOGIC: Cranial nerves II through XII are intact. Muscle strength 5/5 in all extremities. Sensation intact. Gait not checked.  PSYCHIATRIC: The patient is alert and oriented x 3.  Normal affect and good eye contact. SKIN: No obvious rash,  lesion, or ulcer.   LABORATORY PANEL:   CBC Recent Labs  Lab 07/17/20 1503  WBC 9.0  HGB 10.9*  HCT 33.1*  PLT 347   ------------------------------------------------------------------------------------------------------------------  Chemistries  Recent Labs  Lab 07/17/20 1503  NA 138  K 3.3*  CL 101  CO2 30  GLUCOSE 96  BUN 18  CREATININE 1.54*  CALCIUM 10.4*  AST 19  ALT 13  ALKPHOS 53  BILITOT 0.7   ------------------------------------------------------------------------------------------------------------------  Cardiac Enzymes No results for input(s): TROPONINI in the last 168 hours. ------------------------------------------------------------------------------------------------------------------  RADIOLOGY:  CT Angio Abd/Pel W and/or Wo Contrast  Result Date: 07/17/2020 CLINICAL DATA:  Abdominal pain EXAM: CTA ABDOMEN AND PELVIS WITHOUT AND WITH CONTRAST TECHNIQUE: Multidetector CT imaging of the abdomen and pelvis was performed using the standard protocol during bolus administration of intravenous contrast. Multiplanar reconstructed images and MIPs were obtained and reviewed to evaluate the vascular anatomy. CONTRAST:  10mL OMNIPAQUE IOHEXOL 350 MG/ML SOLN COMPARISON:  Ultrasound 07/14/2020.  CT 03/08/2020 FINDINGS: VASCULAR Aorta: Aortic atherosclerosis.  No aneurysm or dissection. Celiac: Mild-to-moderate stenosis just beyond the ostium, likely greater than 50%. SMA: Widely patent. Renals: Patent. Probable mild narrowing at the origin of the left renal artery. Right renal artery widely patent. IMA: Patent.  No visible stenosis. Inflow: Atherosclerosis. No aneurysm or significant stenosis. No dissection. Proximal Outflow: Atherosclerosis. No aneurysm, dissection or significant stenosis. Veins: No obvious venous abnormality within the limitations of this arterial phase study. Review of the MIP images confirms the above findings. NON-VASCULAR Lower chest: No acute  abnormality. Coronary artery disease and aortic atherosclerosis. Hepatobiliary: Scattered small hypodensities in the liver compatible with cysts. Prior cholecystectomy. Pancreas: No focal abnormality or ductal dilatation. Spleen: No focal abnormality.  Normal size. Adrenals/Urinary Tract: Bilateral renal cysts. No suspicious renal or adrenal lesion. No stones or hydronephrosis. Urinary bladder unremarkable. Stomach/Bowel: Normal appendix. Stomach, large and small bowel grossly unremarkable. Lymphatic: No adenopathy Reproductive: Small left ovarian cyst measuring 2.5 cm, stable since prior study. Uterus and right adnexa unremarkable. Other: No free fluid or free air. Musculoskeletal: No acute bony abnormality. IMPRESSION: VASCULAR Aortoiliac atherosclerosis.  No aneurysm or dissection. Mild to moderate narrowing just beyond the origin of the celiac artery, likely 50% or greater. No other mesenteric stenosis. Suspect mild stenosis at the origin of the left renal artery. NON-VASCULAR Hepatic and renal cysts. No bowel changes of ischemia or inflammation. No acute findings. Electronically Signed   By: Rolm Baptise M.D.   On: 07/17/2020 22:21      IMPRESSION AND PLAN:   1.  Abdominal pain questionably  secondary to mesenteric ischemia as evidenced by greater than 50% celiac artery stenosis though CTA showed no signs of bowel ischemia. -The patient will be admitted to a medically monitored bed. -Pain management will be provided. -Vascular surgery consult will be obtained. -Dr. Demetrius Revel was notified and is aware about the patient. -We will continue her Eliquis for now.  2.  Hypertension. -We will continue amlodipine and HCTZ.  3.  Hypothyroidism. -We will continue Synthroid and check TSH level.  4.  Irritable bowel syndrome. -We will continue Linzess.  5.  Depression. -We will continue Celexa.  6.  GERD. -We will continue Protonix and Carafate.  7.  DVT prophylaxis. -We will continue  Eliquis.   All the records are reviewed and case discussed with ED provider. The plan of care was discussed in details with the patient (and family). I answered all questions. The patient agreed to proceed with the above mentioned plan. Further management will depend upon hospital course.   CODE STATUS: Full code  Status is: Inpatient  Remains inpatient appropriate because:Ongoing active pain requiring inpatient pain management, Ongoing diagnostic testing needed not appropriate for outpatient work up, Unsafe d/c plan, IV treatments appropriate due to intensity of illness or inability to take PO and Inpatient level of care appropriate due to severity of illness   Dispo: The patient is from: Home              Anticipated d/c is to: Home              Anticipated d/c date is: 2 days              Patient currently is not medically stable to d/c.   TOTAL TIME TAKING CARE OF THIS PATIENT: 55 minutes.    Christel Mormon M.D on 07/18/2020 at 12:22 AM  [facility]   From 7 PM-7 AM, contact night-coverage [email or web address]  CC: Primary care physician; [name], MD   Note: This dictation was prepared with Dragon dictation along with smaller phrase technology. Any transcriptional typo errors that result from this process are unintentional..

## 2020-07-18 NOTE — Progress Notes (Signed)
PROGRESS NOTE    Latoya Freeman  BPZ:[id number] DOB: 07/25/1941 DOA: 07/17/2020 PCP: [name], MD    Assessment & Plan:   Active Problems:   Mesenteric ischemia (HCC)   Abdominal pain: etiology unclear, possible mesenteric ischemia as there is > 50% celiac artery stenosis though CTA showed no signs of bowel ischemia. Continue on eliquis. Vascular surg consulted and awaiting recs   AKI vs CKD: baseline Cr/GFR is unknown. Cr is trending up slightly from day prior. Continue on IVFs  HTN: continue on home dose of amlodipine, HCTZ, losartan   Hypothyroidism: continue on home dose of levothyroxine   IBS: will continue on home dose of linzess. Lactulose prn    Depression: continue on home dose of citalopram  GERD: will continue on PPI & carafate   DVT prophylaxis: eliquis Code Status: full  Family Communication: Disposition Plan: depends on PT/OT recs    Consultants:   Vascular surg   Procedures:    Antimicrobials:    Subjective: Pt c/o abd pain   Objective: Vitals:   07/18/20 0100 07/18/20 0130 07/18/20 0511 07/18/20 0715  BP: (!) 112/48 (!) 107/53 126/82 135/63  Pulse: 62 61 69 67  Resp:  16 13 13   Temp:      TempSrc:      SpO2: 94% 93% 95% 97%  Weight:      Height:       No intake or output data in the 24 hours ending 07/18/20 0748 Filed Weights   07/17/20 1457  Weight: 91.6 kg    Examination:  General exam: Appears calm and comfortable  Respiratory system: Clear to auscultation. Respiratory effort normal. Cardiovascular system: S1 & S2 +. No rubs, gallops or clicks.  Gastrointestinal system: Abdomen is obese, soft and nontender. Hypoactive bowel sounds heard. Central nervous system: Alert and oriented. Moves all 4 extremities  Psychiatry: Judgement and insight appear normal. Mood & affect appropriate.     Data Reviewed: I have personally reviewed following labs and imaging studies  CBC: Recent Labs  Lab 07/17/20 1503  07/18/20 0513  WBC 9.0 9.2  NEUTROABS 5.0  --   HGB 10.9* 10.1*  HCT 33.1* 31.1*  MCV 83.6 85.4  PLT 347 242   Basic Metabolic Panel: Recent Labs  Lab 07/17/20 1503 07/18/20 0513  NA 138 140  K 3.3* 4.0  CL 101 104  CO2 30 24  GLUCOSE 96 104*  BUN 18 17  CREATININE 1.54* 1.58*  CALCIUM 10.4* 9.6   GFR: Estimated Creatinine Clearance: 29.6 mL/min (A) (by C-G formula based on SCr of 1.58 mg/dL (H)). Liver Function Tests: Recent Labs  Lab 07/17/20 1503  AST 19  ALT 13  ALKPHOS 53  BILITOT 0.7  PROT 8.0  ALBUMIN 4.0   Recent Labs  Lab 07/17/20 1503  LIPASE 57*   No results for input(s): AMMONIA in the last 168 hours. Coagulation Profile: No results for input(s): INR, PROTIME in the last 168 hours. Cardiac Enzymes: No results for input(s): CKTOTAL, CKMB, CKMBINDEX, TROPONINI in the last 168 hours. BNP (last 3 results) No results for input(s): PROBNP in the last 8760 hours. HbA1C: No results for input(s): HGBA1C in the last 72 hours. CBG: No results for input(s): GLUCAP in the last 168 hours. Lipid Profile: No results for input(s): CHOL, HDL, LDLCALC, TRIG, CHOLHDL, LDLDIRECT in the last 72 hours. Thyroid Function Tests: Recent Labs    07/17/20 1511  TSH 5.675*   Anemia Panel: No results for input(s): VITAMINB12, FOLATE, FERRITIN,  TIBC, IRON, RETICCTPCT in the last 72 hours. Sepsis Labs: Recent Labs  Lab 07/17/20 2148  LATICACIDVEN 1.2    Recent Results (from the past 240 hour(s))  SARS Coronavirus 2 by RT PCR (hospital order, performed in [facility] hospital lab) Nasopharyngeal Nasopharyngeal Swab     Status: None   Collection Time: 07/17/20 11:45 PM   Specimen: Nasopharyngeal Swab  Result Value Ref Range Status   SARS Coronavirus 2 NEGATIVE NEGATIVE Final    Comment: (NOTE) SARS-CoV-2 target nucleic acids are NOT DETECTED.  The SARS-CoV-2 RNA is generally detectable in upper and lower respiratory specimens during the acute phase of  infection. The lowest concentration of SARS-CoV-2 viral copies this assay can detect is 250 copies / mL. A negative result does not preclude SARS-CoV-2 infection and should not be used as the sole basis for treatment or other patient management decisions.  A negative result may occur with improper specimen collection / handling, submission of specimen other than nasopharyngeal swab, presence of viral mutation(s) within the areas targeted by this assay, and inadequate number of viral copies (<250 copies / mL). A negative result must be combined with clinical observations, patient history, and epidemiological information.  Fact Sheet for Patients:   StrictlyIdeas.no  Fact Sheet for Healthcare Providers: BankingDealers.co.za  This test is not yet approved or  cleared by the [place] FDA and has been authorized for detection and/or diagnosis of SARS-CoV-2 by FDA under an Emergency Use Authorization (EUA).  This EUA will remain in effect (meaning this test can be used) for the duration of the COVID-19 declaration under Section 564(b)(1) of the Act, 21 U.S.C. section 360bbb-3(b)(1), unless the authorization is terminated or revoked sooner.  Performed at [facility], [address] [place], [place] [address]          Radiology Studies: CT Angio Abd/Pel W and/or Wo Contrast  Result Date: 07/17/2020 CLINICAL DATA:  Abdominal pain EXAM: CTA ABDOMEN AND PELVIS WITHOUT AND WITH CONTRAST TECHNIQUE: Multidetector CT imaging of the abdomen and pelvis was performed using the standard protocol during bolus administration of intravenous contrast. Multiplanar reconstructed images and MIPs were obtained and reviewed to evaluate the vascular anatomy. CONTRAST:  79mL OMNIPAQUE IOHEXOL 350 MG/ML SOLN COMPARISON:  Ultrasound 07/14/2020.  CT 03/08/2020 FINDINGS: VASCULAR Aorta: Aortic atherosclerosis.  No aneurysm or dissection. Celiac:  Mild-to-moderate stenosis just beyond the ostium, likely greater than 50%. SMA: Widely patent. Renals: Patent. Probable mild narrowing at the origin of the left renal artery. Right renal artery widely patent. IMA: Patent.  No visible stenosis. Inflow: Atherosclerosis. No aneurysm or significant stenosis. No dissection. Proximal Outflow: Atherosclerosis. No aneurysm, dissection or significant stenosis. Veins: No obvious venous abnormality within the limitations of this arterial phase study. Review of the MIP images confirms the above findings. NON-VASCULAR Lower chest: No acute abnormality. Coronary artery disease and aortic atherosclerosis. Hepatobiliary: Scattered small hypodensities in the liver compatible with cysts. Prior cholecystectomy. Pancreas: No focal abnormality or ductal dilatation. Spleen: No focal abnormality.  Normal size. Adrenals/Urinary Tract: Bilateral renal cysts. No suspicious renal or adrenal lesion. No stones or hydronephrosis. Urinary bladder unremarkable. Stomach/Bowel: Normal appendix. Stomach, large and small bowel grossly unremarkable. Lymphatic: No adenopathy Reproductive: Small left ovarian cyst measuring 2.5 cm, stable since prior study. Uterus and right adnexa unremarkable. Other: No free fluid or free air. Musculoskeletal: No acute bony abnormality. IMPRESSION: VASCULAR Aortoiliac atherosclerosis.  No aneurysm or dissection. Mild to moderate narrowing just beyond the origin of the celiac artery, likely 50%  or greater. No other mesenteric stenosis. Suspect mild stenosis at the origin of the left renal artery. NON-VASCULAR Hepatic and renal cysts. No bowel changes of ischemia or inflammation. No acute findings. Electronically Signed   By: Rolm Baptise M.D.   On: 07/17/2020 22:21        Scheduled Meds: . amLODipine  5 mg Oral Daily  . apixaban  5 mg Oral BID  . citalopram  20 mg Oral Daily  . hydrochlorothiazide  25 mg Oral Daily  . levothyroxine  75 mcg Oral Daily  .  linaclotide  145 mcg Oral QAC breakfast  . losartan  50 mg Oral Daily  . pantoprazole  40 mg Oral Daily  . potassium chloride  20 mEq Oral Daily  . sucralfate  1 g Oral QID   Continuous Infusions: . sodium chloride    . sodium chloride       LOS: 1 day    Time spent: 33 mins     Wyvonnia Dusky, MD [facility] Pager [phone]  If 7PM-7AM, please contact night-coverage [email or web address] 07/18/2020, 7:48 AM

## 2020-07-18 NOTE — ED Notes (Signed)
Attempted 3 computers to scan medication and all 3 unable to scan - IT notified and medication given without scanning

## 2020-07-18 NOTE — ED Notes (Signed)
Patient being transported to floor

## 2020-07-18 NOTE — ED Notes (Signed)
Discussed conflicting diet order with admitting provider  Pt I to be NPO until after vascular surgery consult

## 2020-07-18 NOTE — ED Notes (Signed)
Pt assisted to toilet to void 

## 2020-07-19 LAB — CBC
HCT: 29.1 % — ABNORMAL LOW (ref 36.0–46.0)
Hemoglobin: 9.3 g/dL — ABNORMAL LOW (ref 12.0–15.0)
MCH: 27.3 pg (ref 26.0–34.0)
MCHC: 32 g/dL (ref 30.0–36.0)
MCV: 85.3 fL (ref 80.0–100.0)
Platelets: 270 10*3/uL (ref 150–400)
RBC: 3.41 MIL/uL — ABNORMAL LOW (ref 3.87–5.11)
RDW: 15.8 % — ABNORMAL HIGH (ref 11.5–15.5)
WBC: 6.4 10*3/uL (ref 4.0–10.5)
nRBC: 0 % (ref 0.0–0.2)

## 2020-07-19 LAB — BASIC METABOLIC PANEL
Anion gap: 7 (ref 5–15)
BUN: 14 mg/dL (ref 8–23)
CO2: 28 mmol/L (ref 22–32)
Calcium: 9.1 mg/dL (ref 8.9–10.3)
Chloride: 106 mmol/L (ref 98–111)
Creatinine, Ser: 1.49 mg/dL — ABNORMAL HIGH (ref 0.44–1.00)
GFR calc Af Amer: 39 mL/min — ABNORMAL LOW (ref >60)
GFR calc non Af Amer: 33 mL/min — ABNORMAL LOW (ref >60)
Glucose, Bld: 97 mg/dL (ref 70–99)
Potassium: 3.1 mmol/L — ABNORMAL LOW (ref 3.5–5.1)
Sodium: 141 mmol/L (ref 135–145)

## 2020-07-19 MED ORDER — POTASSIUM CHLORIDE CRYS ER 20 MEQ PO TBCR
40.0000 meq | EXTENDED_RELEASE_TABLET | Freq: Once | ORAL | Status: AC
  Administered 2020-07-19: 40 meq via ORAL
  Filled 2020-07-19: qty 2

## 2020-07-19 MED ORDER — LACTULOSE 10 GM/15ML PO SOLN
30.0000 g | Freq: Three times a day (TID) | ORAL | Status: DC
  Administered 2020-07-19: 30 g via ORAL
  Filled 2020-07-19 (×3): qty 60

## 2020-07-19 MED ORDER — SODIUM CHLORIDE 0.9 % IV BOLUS
1000.0000 mL | Freq: Once | INTRAVENOUS | Status: AC
  Administered 2020-07-19: 1000 mL via INTRAVENOUS

## 2020-07-19 NOTE — Evaluation (Signed)
Occupational Therapy Evaluation Patient Details Name: Latoya Freeman MRN: [id number] DOB: September 13, 1941 Today's Date: 07/19/2020    History of Present Illness Per MD Note:Latoya Freeman  is a 79 y.o. Caucasian female with a known history of hypertension, hypothyroidism, GERD and PE, who presented to the the emergency room with acute onset of upper abdominal pain with associated nausea without vomiting.  She has had black stools for a year but denied any bright red bleeding per rectum.  No fever or chills.  No chest pain or dyspnea or palpitations.  No cough or wheezing or hemoptysis.  No other bleeding diathesis.   Clinical Impression   Patient seen for OT evaluation.  Patient demonstrates some generalized weakness however she is able to perform self care tasks with modified independence, all transfers and functional mobility with modified independence.  She does not require any further OT at this time and no follow up at discharge.  Please reconsult if any further needs arise.      Follow Up Recommendations  No OT follow up    Equipment Recommendations       Recommendations for Other Services       Precautions / Restrictions Precautions Precautions: None Restrictions Weight Bearing Restrictions: No      Mobility Bed Mobility Overal bed mobility: Independent                Transfers Overall transfer level: Independent Equipment used: None                  Balance Overall balance assessment: Modified Independent Sitting-balance support: No upper extremity supported;Feet supported Sitting balance-Leahy Scale: Normal       Standing balance-Leahy Scale: Normal                             ADL either performed or assessed with clinical judgement   ADL Overall ADL's : At baseline;Modified independent                                       General ADL Comments: Pt able to demonstrate independent bed mobility, transfers, grooming at the  sink, lower body dressing with modified independence.  She does have some generalized weakness.     Vision Baseline Vision/History: Wears glasses Wears Glasses: Reading only       Perception     Praxis      Pertinent Vitals/Pain Pain Assessment: No/denies pain Pain Score: 0-No pain Pain Location: back Pain Descriptors / Indicators: Aching Pain Intervention(s): Limited activity within patient's tolerance;Monitored during session     Hand Dominance Left   Extremity/Trunk Assessment Upper Extremity Assessment Upper Extremity Assessment: Generalized weakness   Lower Extremity Assessment Lower Extremity Assessment: Overall WFL for tasks assessed       Communication Communication Communication: No difficulties   Cognition Arousal/Alertness: Awake/alert Behavior During Therapy: WFL for tasks assessed/performed Overall Cognitive Status: Within Functional Limits for tasks assessed                                     General Comments       Exercises     Shoulder Instructions      Home Living Family/patient expects to be discharged to:: Private residence Living Arrangements: Non-relatives/Friends Available Help at Discharge: Friend(s) Type of  Home: House Home Access: Stairs to enter [employer] of Steps: 6 Entrance Stairs-Rails: Right Home Layout: One level     Bathroom Shower/Tub: Corporate investment banker: Handicapped height     Home Equipment: None          Prior Functioning/Environment Level of Independence: Independent                 OT Problem List: Decreased strength;Decreased activity tolerance      OT Treatment/Interventions:      OT Goals(Current goals can be found in the care plan section) Acute Rehab OT Goals Patient Stated Goal: to go home OT Goal Formulation: All assessment and education complete, DC therapy Potential to Achieve Goals: Good  OT Frequency:     Barriers to D/C:             Co-evaluation              AM-PAC OT "6 Clicks" Daily Activity     Outcome Measure Help from another person eating meals?: None Help from another person taking care of personal grooming?: None Help from another person toileting, which includes using toliet, bedpan, or urinal?: None Help from another person bathing (including washing, rinsing, drying)?: None Help from another person to put on and taking off regular upper body clothing?: None Help from another person to put on and taking off regular lower body clothing?: None 6 Click Score: 24   End of Session    Activity Tolerance: Patient tolerated treatment well Patient left: in bed;with call bell/phone within reach;with family/visitor present  OT Visit Diagnosis: Muscle weakness (generalized) (M62.81)                Time: 6256-3893 OT Time Calculation (min): 20 min Charges:  OT General Charges $OT Visit: 1 Visit OT Evaluation $OT Eval Low Complexity: 1 Low  Mckoy Bhakta T Quatavious Rossa, OTR/L, CLT   Abbygale Lapid 07/19/2020, 4:45 PM

## 2020-07-19 NOTE — Progress Notes (Signed)
Physical Therapy Evaluation Patient Details Name: Latoya Freeman MRN: [id number] DOB: Apr 21, 1941 Today's Date: 07/19/2020   History of Present Illness  Per MD Note:Latoya Freeman  is a 79 y.o. Caucasian female with a known history of hypertension, hypothyroidism, GERD and PE, who presented to the the emergency room with acute onset of upper abdominal pain with associated nausea without vomiting.  She has had black stools for a year but denied any bright red bleeding per rectum.  No fever or chills.  No chest pain or dyspnea or palpitations.  No cough or wheezing or hemoptysis.  No other bleeding diathesis.  Clinical Impression  Patient agrees to PT evaluation. She has WFL strength BLE. She reports back pain 8/10. She has normal balance sitting and standing. She is independent with bed mobility, MI with transfers and MI with gait in room 20 feet, limited by back pain. She has no skilled PT needs at this time and will be DC from PT.     Follow Up Recommendations No PT follow up    Equipment Recommendations  None recommended by PT    Recommendations for Other Services       Precautions / Restrictions Precautions Precautions: None Restrictions Weight Bearing Restrictions: No      Mobility  Bed Mobility Overal bed mobility: Independent                Transfers Overall transfer level: Independent Equipment used: None                Ambulation/Gait Ambulation/Gait assistance: Modified independent (Device/Increase time) Gait Distance (Feet): 20 Feet Assistive device: IV Pole Gait Pattern/deviations: Step-to pattern        Stairs            Wheelchair Mobility    Modified Rankin (Stroke Patients Only)       Balance Overall balance assessment: Modified Independent Sitting-balance support: No upper extremity supported;Feet supported Sitting balance-Leahy Scale: Normal       Standing balance-Leahy Scale: Normal                                Pertinent Vitals/Pain Pain Assessment: 0-10 Pain Score: 8  Pain Location: back Pain Descriptors / Indicators: Aching Pain Intervention(s): Limited activity within patient's tolerance;Monitored during session    Home Living Family/patient expects to be discharged to:: Private residence Living Arrangements: Non-relatives/Friends Available Help at Discharge: Friend(s) Type of Home: House Home Access: Stairs to enter Entrance Stairs-Rails: Right Entrance Stairs-Number of Steps: 6   Home Equipment: None      Prior Function Level of Independence: Independent               Hand Dominance        Extremity/Trunk Assessment        Lower Extremity Assessment Lower Extremity Assessment: Overall WFL for tasks assessed       Communication   Communication: No difficulties  Cognition Arousal/Alertness: Awake/alert Behavior During Therapy: WFL for tasks assessed/performed Overall Cognitive Status: Within Functional Limits for tasks assessed                                        General Comments      Exercises     Assessment/Plan    PT Assessment Patient needs continued PT services  PT Problem List Decreased strength;Decreased mobility  PT Treatment Interventions Gait training;Therapeutic activities;Therapeutic exercise    PT Goals (Current goals can be found in the Care Plan section)  Acute Rehab PT Goals Patient Stated Goal: to walk PT Goal Formulation: All assessment and education complete, DC therapy    Frequency Min 2X/week   Barriers to discharge        Co-evaluation               AM-PAC PT "6 Clicks" Mobility  Outcome Measure Help needed turning from your back to your side while in a flat bed without using bedrails?: None Help needed moving from lying on your back to sitting on the side of a flat bed without using bedrails?: None Help needed moving to and from a bed to a chair (including a wheelchair)?:  None Help needed standing up from a chair using your arms (e.g., wheelchair or bedside chair)?: None Help needed to walk in hospital room?: None Help needed climbing 3-5 steps with a railing? : A Little 6 Click Score: 23    End of Session Equipment Utilized During Treatment: Gait belt Activity Tolerance: Patient tolerated treatment well Patient left: in bed;with bed alarm set Nurse Communication: Mobility status PT Visit Diagnosis: Difficulty in walking, not elsewhere classified (R26.2)    Time: 2836-6294 PT Time Calculation (min) (ACUTE ONLY): 15 min   Charges:   PT Evaluation $PT Eval Low Complexity: 1 Low            [place], Sherryl Barters, PT DPT 07/19/2020, 4:03 PM

## 2020-07-19 NOTE — Progress Notes (Signed)
PROGRESS NOTE    Latoya Freeman  HER:[id number] DOB: [birth year] DOA: 07/17/2020 PCP: [name], MD    Assessment & Plan:   Active Problems:   Mesenteric ischemia (HCC)   Abdominal pain: etiology unclear. 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 Continue on eliquis. Vascular surg recs apprec  AKI vs CKD: baseline Cr/GFR is unknown. Cr is trending up slightly from day prior. Continue on IVFs  Hypokalemia: KCl repleted. Will continue to monitor   HTN: continue on home dose of amlodipine, HCTZ, losartan   Hypothyroidism: continue on home dose of levothyroxine   IBS: will continue on home dose of linzess. Continue on lactulose    Depression: continue on home dose of citalopram  GERD: will continue on PPI & carafate   DVT prophylaxis: eliquis Code Status: full  Family Communication: discussed pt's care w/ pt's daughter, Adela Lank, and answered her questions  Disposition Plan: depends on PT/OT recs    Consultants:   Vascular surg   Procedures:    Antimicrobials:    Subjective: Pt c/o intermittent abd pain  Objective: Vitals:   07/19/20 0013 07/19/20 0020 07/19/20 0440 07/19/20 0550  BP: (!) 104/46  (!) 107/50   Pulse: 72 79 72   Resp: 16  15   Temp: 98.7 F (37.1 C)  98.9 F (37.2 C)   TempSrc: Oral  Oral   SpO2: (!) 85% 94% (!) 86% 93%  Weight:      Height:        Intake/Output Summary (Last 24 hours) at 07/19/2020 0736 Last data filed at 07/19/2020 0118 Gross per 24 hour  Intake 1275.41 ml  Output 600 ml  Net 675.41 ml   Filed Weights   07/17/20 1457 07/18/20 1831  Weight: 91.6 kg 90.6 kg    Examination:  General exam: Appears calm and comfortable  Respiratory system: decreased breath sounds b/l Cardiovascular system: S1 & S2 +. No rubs, gallops or clicks.  Gastrointestinal system: Abdomen is obese, soft and nontender. Hypoactive bowel sounds heard. Central nervous system: Alert and oriented. Moves all 4  extremities  Psychiatry: Judgement and insight appear normal. Flat mood and affect.     Data Reviewed: I have personally reviewed following labs and imaging studies  CBC: Recent Labs  Lab 07/17/20 1503 07/18/20 0513 07/19/20 0543  WBC 9.0 9.2 6.4  NEUTROABS 5.0  --   --   HGB 10.9* 10.1* 9.3*  HCT 33.1* 31.1* 29.1*  MCV 83.6 85.4 85.3  PLT 347 296 856   Basic Metabolic Panel: Recent Labs  Lab 07/17/20 1503 07/18/20 0513 07/19/20 0543  NA 138 140 141  K 3.3* 4.0 3.1*  CL 101 104 106  CO2 30 24 28   GLUCOSE 96 104* 97  BUN 18 17 14   CREATININE 1.54* 1.58* 1.49*  CALCIUM 10.4* 9.6 9.1   GFR: Estimated Creatinine Clearance: 31.2 mL/min (A) (by C-G formula based on SCr of 1.49 mg/dL (H)). Liver Function Tests: Recent Labs  Lab 07/17/20 1503  AST 19  ALT 13  ALKPHOS 53  BILITOT 0.7  PROT 8.0  ALBUMIN 4.0   Recent Labs  Lab 07/17/20 1503  LIPASE 57*   No results for input(s): AMMONIA in the last 168 hours. Coagulation Profile: No results for input(s): INR, PROTIME in the last 168 hours. Cardiac Enzymes: No results for input(s): CKTOTAL, CKMB, CKMBINDEX, TROPONINI in the last 168 hours. BNP (last 3 results) No results for input(s): PROBNP in the  last 8760 hours. HbA1C: No results for input(s): HGBA1C in the last 72 hours. CBG: No results for input(s): GLUCAP in the last 168 hours. Lipid Profile: No results for input(s): CHOL, HDL, LDLCALC, TRIG, CHOLHDL, LDLDIRECT in the last 72 hours. Thyroid Function Tests: Recent Labs    07/17/20 1511  TSH 5.675*   Anemia Panel: No results for input(s): VITAMINB12, FOLATE, FERRITIN, TIBC, IRON, RETICCTPCT in the last 72 hours. Sepsis Labs: Recent Labs  Lab 07/17/20 2148  LATICACIDVEN 1.2    Recent Results (from the past 240 hour(s))  SARS Coronavirus 2 by RT PCR (hospital order, performed in [facility] hospital lab) Nasopharyngeal Nasopharyngeal Swab     Status: None   Collection Time: 07/17/20 11:45 PM    Specimen: Nasopharyngeal Swab  Result Value Ref Range Status   SARS Coronavirus 2 NEGATIVE NEGATIVE Final    Comment: (NOTE) SARS-CoV-2 target nucleic acids are NOT DETECTED.  The SARS-CoV-2 RNA is generally detectable in upper and lower respiratory specimens during the acute phase of infection. The lowest concentration of SARS-CoV-2 viral copies this assay can detect is 250 copies / mL. A negative result does not preclude SARS-CoV-2 infection and should not be used as the sole basis for treatment or other patient management decisions.  A negative result may occur with improper specimen collection / handling, submission of specimen other than nasopharyngeal swab, presence of viral mutation(s) within the areas targeted by this assay, and inadequate number of viral copies (<250 copies / mL). A negative result must be combined with clinical observations, patient history, and epidemiological information.  Fact Sheet for Patients:   StrictlyIdeas.no  Fact Sheet for Healthcare Providers: BankingDealers.co.za  This test is not yet approved or  cleared by the [place] FDA and has been authorized for detection and/or diagnosis of SARS-CoV-2 by FDA under an Emergency Use Authorization (EUA).  This EUA will remain in effect (meaning this test can be used) for the duration of the COVID-19 declaration under Section 564(b)(1) of the Act, 21 U.S.C. section 360bbb-3(b)(1), unless the authorization is terminated or revoked sooner.  Performed at [facility], [address] [place], [place] [address]          Radiology Studies: CT Angio Abd/Pel W and/or Wo Contrast  Result Date: 07/17/2020 CLINICAL DATA:  Abdominal pain EXAM: CTA ABDOMEN AND PELVIS WITHOUT AND WITH CONTRAST TECHNIQUE: Multidetector CT imaging of the abdomen and pelvis was performed using the standard protocol during bolus administration of intravenous contrast.  Multiplanar reconstructed images and MIPs were obtained and reviewed to evaluate the vascular anatomy. CONTRAST:  57mL OMNIPAQUE IOHEXOL 350 MG/ML SOLN COMPARISON:  Ultrasound 07/14/2020.  CT 03/08/2020 FINDINGS: VASCULAR Aorta: Aortic atherosclerosis.  No aneurysm or dissection. Celiac: Mild-to-moderate stenosis just beyond the ostium, likely greater than 50%. SMA: Widely patent. Renals: Patent. Probable mild narrowing at the origin of the left renal artery. Right renal artery widely patent. IMA: Patent.  No visible stenosis. Inflow: Atherosclerosis. No aneurysm or significant stenosis. No dissection. Proximal Outflow: Atherosclerosis. No aneurysm, dissection or significant stenosis. Veins: No obvious venous abnormality within the limitations of this arterial phase study. Review of the MIP images confirms the above findings. NON-VASCULAR Lower chest: No acute abnormality. Coronary artery disease and aortic atherosclerosis. Hepatobiliary: Scattered small hypodensities in the liver compatible with cysts. Prior cholecystectomy. Pancreas: No focal abnormality or ductal dilatation. Spleen: No focal abnormality.  Normal size. Adrenals/Urinary Tract: Bilateral renal cysts. No suspicious renal or adrenal lesion. No stones or hydronephrosis. Urinary bladder  unremarkable. Stomach/Bowel: Normal appendix. Stomach, large and small bowel grossly unremarkable. Lymphatic: No adenopathy Reproductive: Small left ovarian cyst measuring 2.5 cm, stable since prior study. Uterus and right adnexa unremarkable. Other: No free fluid or free air. Musculoskeletal: No acute bony abnormality. IMPRESSION: VASCULAR Aortoiliac atherosclerosis.  No aneurysm or dissection. Mild to moderate narrowing just beyond the origin of the celiac artery, likely 50% or greater. No other mesenteric stenosis. Suspect mild stenosis at the origin of the left renal artery. NON-VASCULAR Hepatic and renal cysts. No bowel changes of ischemia or inflammation. No acute  findings. Electronically Signed   By: Rolm Baptise M.D.   On: 07/17/2020 22:21        Scheduled Meds: . amLODipine  5 mg Oral Daily  . apixaban  5 mg Oral BID  . citalopram  20 mg Oral Daily  . levothyroxine  75 mcg Oral Daily  . linaclotide  145 mcg Oral QAC breakfast  . losartan  50 mg Oral Daily  . pantoprazole  40 mg Oral Daily  . potassium chloride  20 mEq Oral Daily  . potassium chloride  40 mEq Oral Once  . sucralfate  1 g Oral QID   Continuous Infusions: . sodium chloride 75 mL/hr at 07/19/20 0550     LOS: 2 days    Time spent: 33 mins     Wyvonnia Dusky, MD [facility] Pager [phone]  If 7PM-7AM, please contact night-coverage [email or web address] 07/19/2020, 7:36 AM

## 2020-07-20 DIAGNOSIS — I708 Atherosclerosis of other arteries: Secondary | ICD-10-CM

## 2020-07-20 LAB — CBC
HCT: 29 % — ABNORMAL LOW (ref 36.0–46.0)
Hemoglobin: 9.7 g/dL — ABNORMAL LOW (ref 12.0–15.0)
MCH: 27.8 pg (ref 26.0–34.0)
MCHC: 33.4 g/dL (ref 30.0–36.0)
MCV: 83.1 fL (ref 80.0–100.0)
Platelets: 268 10*3/uL (ref 150–400)
RBC: 3.49 MIL/uL — ABNORMAL LOW (ref 3.87–5.11)
RDW: 15.7 % — ABNORMAL HIGH (ref 11.5–15.5)
WBC: 7.4 10*3/uL (ref 4.0–10.5)
nRBC: 0 % (ref 0.0–0.2)

## 2020-07-20 LAB — BASIC METABOLIC PANEL
Anion gap: 8 (ref 5–15)
BUN: 11 mg/dL (ref 8–23)
CO2: 25 mmol/L (ref 22–32)
Calcium: 9.2 mg/dL (ref 8.9–10.3)
Chloride: 107 mmol/L (ref 98–111)
Creatinine, Ser: 1.24 mg/dL — ABNORMAL HIGH (ref 0.44–1.00)
GFR calc Af Amer: 48 mL/min — ABNORMAL LOW (ref >60)
GFR calc non Af Amer: 42 mL/min — ABNORMAL LOW (ref >60)
Glucose, Bld: 98 mg/dL (ref 70–99)
Potassium: 3 mmol/L — ABNORMAL LOW (ref 3.5–5.1)
Sodium: 140 mmol/L (ref 135–145)

## 2020-07-20 MED ORDER — TRAMADOL HCL 50 MG PO TABS: 50.0000 mg | ORAL_TABLET | Freq: Four times a day (QID) | ORAL | 0 refills | Status: AC | PRN

## 2020-07-20 MED ORDER — POTASSIUM CHLORIDE CRYS ER 20 MEQ PO TBCR: 40.0000 meq | EXTENDED_RELEASE_TABLET | Freq: Once | ORAL | Status: DC

## 2020-07-20 MED ORDER — POTASSIUM CHLORIDE CRYS ER 20 MEQ PO TBCR
40.0000 meq | EXTENDED_RELEASE_TABLET | Freq: Once | ORAL | Status: AC
  Administered 2020-07-20: 40 meq via ORAL
  Filled 2020-07-20: qty 2

## 2020-07-20 NOTE — Discharge Summary (Signed)
Physician Discharge Summary  Latoya Freeman PJA:[id number] DOB: 1940/12/29 DOA: 07/17/2020  PCP: [name], MD  Admit date: 07/17/2020 Discharge date: 07/20/2020  Admitted From: home Disposition: home  Recommendations for Outpatient Follow-up:  1. Follow up with PCP in 1-2 weeks 2. F/u GI in 1-2 days  Home Health: no Equipment/Devices:  Discharge Condition: stable  CODE STATUS: full Diet recommendation: Heart Healthy  Brief/Interim Summary: HPI was taken from Dr. Sidney Ace: Latoya Freeman  is a 79 y.o. Caucasian female with a known history of hypertension, hypothyroidism, GERD and PE, who presented to the the emergency room with acute onset of upper abdominal pain with associated nausea without vomiting.  She has had black stools for a year but denied any bright red bleeding per rectum.  No fever or chills.  No chest pain or dyspnea or palpitations.  No cough or wheezing or hemoptysis.  No other bleeding diathesis.  Upon presentation to the emergency room, blood pressure was 142/58 with otherwise normal vital signs.  Labs revealed CMP with hypokalemia of 3.3 with a creatinine of 1.54 better than last month, lactic acid 1.2 and CBC showed anemia close to previous levels.  UA showed 6-10 WBCs and trace leukocytes.  Two-view chest x-ray showed no acute cardiopulmonary disease.  Showed minimal right basal atelectasis/scarring with no changes.EKG showed normal sinus rhythm with a rate of 68 with occasional PVCs with low voltage QRS. Abdominal and pelvic CTA revealed mild to moderate celiac stenosis just beyond the ostium, likely greater than 50% abdomen, hepatic and renal cysts with no bowel changes of ischemia or inflammation and no acute findings.  The patient was given 4 mg of IV morphine sulfate and later 6 mg IV, Zofran 4 mg IV, 1 L bolus of IV normal saline followed 100 mL/h.  She will be admitted to a medically monitored bed for further evaluation and management.    Hospital Course from  Dr. Lenise Herald 8/27-8/29/21: Pt presented w/ abd pain of unknown etiology. Pt was found to have >50% celiac artery stenosis found on CTA for which vascular surgery did not believe was the etiology of her abd pain.Pt will need to f/u w/ GI as an outpatient to further evaluation and treatment. Pt and pt's daughter verbalized their understanding.     Discharge Diagnoses:  Active Problems:   Mesenteric ischemia (HCC)  Abdominal pain: etiology unclear. 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 Continue on eliquis. Vascular surg recs apprec  AKI vs CKD: baseline Cr/GFR is unknown. Cr is trending down from day prior   Hypokalemia: KCl repleted again. Will continue to monitor   HTN: continue on home dose of amlodipine, HCTZ, losartan   Hypothyroidism: continue on home dose of levothyroxine   IBS: will continue on home dose of linzess. Continue on lactulose    Depression: continue on home dose of citalopram  GERD: will continue on PPI & carafate  Discharge Instructions  Discharge Instructions    Diet - low sodium heart healthy   Complete by: As directed    Discharge instructions   Complete by: As directed    F/u PCP in 1 week. F/u w/ GI in 1-2 days. You can take tramadol every 6 hours as needed for pain but keep in mind that this medication can cause constipation, so make sure you take something like colace (which is over the counter) or laxative as needed.   Increase activity slowly   Complete by: As directed  Allergies as of 07/20/2020      Reactions   Bee Venom Swelling   Shellfish Allergy Swelling   Tamoxifen Hives   Penicillins Hives, Rash, Other (See Comments)   Has patient had a PCN reaction causing immediate rash, facial/tongue/throat swelling, SOB or lightheadedness with hypotension: Unknown Has patient had a PCN reaction causing severe rash involving mucus membranes or skin necrosis: Unknown Has patient had a PCN reaction that  required hospitalization: Unknown Has patient had a PCN reaction occurring within the last 10 years: No If all of the above answers are "NO", then may proceed with Cephalosporin use.      Medication List    STOP taking these medications   hydrochlorothiazide 25 MG tablet Commonly known as: HYDRODIURIL     TAKE these medications   acetaminophen 500 MG tablet Commonly known as: TYLENOL Take 1 tablet by mouth every 6 (six) hours as needed.   amLODipine 5 MG tablet Commonly known as: NORVASC Take 5 mg by mouth daily.   citalopram 20 MG tablet Commonly known as: CELEXA Take 20 mg by mouth daily.   Eliquis 5 MG Tabs tablet Generic drug: apixaban Take 5 mg by mouth 2 (two) times daily.   lactulose 10 GM/15ML solution Commonly known as: CHRONULAC Take 30 mLs (20 g total) by mouth daily as needed for mild constipation.   levothyroxine 75 MCG tablet Commonly known as: SYNTHROID Take 75 mcg by mouth daily.   linaclotide 145 MCG Caps capsule Commonly known as: Linzess Take 1 capsule (145 mcg total) by mouth daily before breakfast.   losartan 50 MG tablet Commonly known as: COZAAR Take 50 mg by mouth daily.   omeprazole 40 MG capsule Commonly known as: PRILOSEC Take 1 capsule (40 mg total) by mouth in the morning and at bedtime.   potassium chloride 10 MEQ tablet Commonly known as: KLOR-CON Take 10 mEq by mouth 2 (two) times daily.   sucralfate 1 g tablet Commonly known as: Carafate Take 1 tablet (1 g total) by mouth 4 (four) times daily.   traMADol 50 MG tablet Commonly known as: Ultram Take 1 tablet (50 mg total) by mouth every 6 (six) hours as needed for up to 3 days for moderate pain or severe pain.   triamterene-hydrochlorothiazide 37.5-25 MG tablet Commonly known as: MAXZIDE-25 Take 1 tablet by mouth daily.       Allergies  Allergen Reactions  . Bee Venom Swelling  . Shellfish Allergy Swelling  . Tamoxifen Hives  . Penicillins Hives, Rash and Other  (See Comments)    Has patient had a PCN reaction causing immediate rash, facial/tongue/throat swelling, SOB or lightheadedness with hypotension: Unknown Has patient had a PCN reaction causing severe rash involving mucus membranes or skin necrosis: Unknown Has patient had a PCN reaction that required hospitalization: Unknown Has patient had a PCN reaction occurring within the last 10 years: No If all of the above answers are "NO", then may proceed with Cephalosporin use.     Consultations:     Procedures/Studies: [place] MESENTERIC ARTERIES  Result Date: 07/14/2020 CLINICAL DATA:  79 year old female with pain after eating EXAM: [place] MESENTERIC ARTERIAL DOPPLER COMPARISON:  No prior duplex FINDINGS: Celiac axis: 181 cm/sec Celiac axis with inspiration: 181 cm/sec Celiac axis with expiration: 55 cm/sec Splenic artery: 104 cm/sec Hepatic artery: 122 cm/sec SMA: 109 cm/sec proximal, 409 centimeter/second mid segment. IMA: Not visualized Aorta: 46 cm/sec Aortic size: 2.2 cm proximally, 1.7 cm in the mid segment and 1.3 cm distally  IMPRESSION: Directed duplex of the mesenteric arteries demonstrates no ostial stenosis of celiac artery or SMA. IMA not sampled. Elevated velocity in the mid segment of the SMA is recorded, though this may represent artifact. Signed, Dulcy Fanny. Dellia Nims, RPVI [facility] Electronically Signed   By: Corrie Mckusick D.O.   On: 07/14/2020 11:25   CT Angio Abd/Pel W and/or Wo Contrast  Result Date: 07/17/2020 CLINICAL DATA:  Abdominal pain EXAM: CTA ABDOMEN AND PELVIS WITHOUT AND WITH CONTRAST TECHNIQUE: Multidetector CT imaging of the abdomen and pelvis was performed using the standard protocol during bolus administration of intravenous contrast. Multiplanar reconstructed images and MIPs were obtained and reviewed to evaluate the vascular anatomy. CONTRAST:  21mL OMNIPAQUE IOHEXOL 350 MG/ML SOLN COMPARISON:  Ultrasound  07/14/2020.  CT 03/08/2020 FINDINGS: VASCULAR Aorta: Aortic atherosclerosis.  No aneurysm or dissection. Celiac: Mild-to-moderate stenosis just beyond the ostium, likely greater than 50%. SMA: Widely patent. Renals: Patent. Probable mild narrowing at the origin of the left renal artery. Right renal artery widely patent. IMA: Patent.  No visible stenosis. Inflow: Atherosclerosis. No aneurysm or significant stenosis. No dissection. Proximal Outflow: Atherosclerosis. No aneurysm, dissection or significant stenosis. Veins: No obvious venous abnormality within the limitations of this arterial phase study. Review of the MIP images confirms the above findings. NON-VASCULAR Lower chest: No acute abnormality. Coronary artery disease and aortic atherosclerosis. Hepatobiliary: Scattered small hypodensities in the liver compatible with cysts. Prior cholecystectomy. Pancreas: No focal abnormality or ductal dilatation. Spleen: No focal abnormality.  Normal size. Adrenals/Urinary Tract: Bilateral renal cysts. No suspicious renal or adrenal lesion. No stones or hydronephrosis. Urinary bladder unremarkable. Stomach/Bowel: Normal appendix. Stomach, large and small bowel grossly unremarkable. Lymphatic: No adenopathy Reproductive: Small left ovarian cyst measuring 2.5 cm, stable since prior study. Uterus and right adnexa unremarkable. Other: No free fluid or free air. Musculoskeletal: No acute bony abnormality. IMPRESSION: VASCULAR Aortoiliac atherosclerosis.  No aneurysm or dissection. Mild to moderate narrowing just beyond the origin of the celiac artery, likely 50% or greater. No other mesenteric stenosis. Suspect mild stenosis at the origin of the left renal artery. NON-VASCULAR Hepatic and renal cysts. No bowel changes of ischemia or inflammation. No acute findings. Electronically Signed   By: Rolm Baptise M.D.   On: 07/17/2020 22:21      Subjective: Pt c/o intermittent abd pain    Discharge Exam: Vitals:   07/19/20  1949 07/20/20 0451  BP: 126/60 (!) 116/51  Pulse: 72 71  Resp: 18 18  Temp: 99.3 F (37.4 C) 98.2 F (36.8 C)  SpO2: 92% 97%   Vitals:   07/19/20 1151 07/19/20 1231 07/19/20 1949 07/20/20 0451  BP: (!) 131/54 131/62 126/60 (!) 116/51  Pulse: 71 69 72 71  Resp: 17  18 18   Temp: 99.2 F (37.3 C)  99.3 F (37.4 C) 98.2 F (36.8 C)  TempSrc: Oral  Oral Oral  SpO2: 94%  92% 97%  Weight:      Height:        General: Pt is alert, awake, not in acute distress Cardiovascular:S1/S2 +, no rubs, no gallops Respiratory: CTA bilaterally, no wheezing, no rhonchi Abdominal: Soft, NT, obese, bowel sounds + Extremities: no cyanosis    The results of significant diagnostics from this hospitalization (including imaging, microbiology, ancillary and laboratory) are listed below for reference.     Microbiology: Recent Results (from the past 240 hour(s))  SARS Coronavirus 2 by RT PCR (hospital order, performed in [facility]  hospital lab) Nasopharyngeal Nasopharyngeal Swab     Status: None   Collection Time: 07/17/20 11:45 PM   Specimen: Nasopharyngeal Swab  Result Value Ref Range Status   SARS Coronavirus 2 NEGATIVE NEGATIVE Final    Comment: (NOTE) SARS-CoV-2 target nucleic acids are NOT DETECTED.  The SARS-CoV-2 RNA is generally detectable in upper and lower respiratory specimens during the acute phase of infection. The lowest concentration of SARS-CoV-2 viral copies this assay can detect is 250 copies / mL. A negative result does not preclude SARS-CoV-2 infection and should not be used as the sole basis for treatment or other patient management decisions.  A negative result may occur with improper specimen collection / handling, submission of specimen other than nasopharyngeal swab, presence of viral mutation(s) within the areas targeted by this assay, and inadequate number of viral copies (<250 copies / mL). A negative result must be combined with clinical observations, patient  history, and epidemiological information.  Fact Sheet for Patients:   StrictlyIdeas.no  Fact Sheet for Healthcare Providers: BankingDealers.co.za  This test is not yet approved or  cleared by the [place] FDA and has been authorized for detection and/or diagnosis of SARS-CoV-2 by FDA under an Emergency Use Authorization (EUA).  This EUA will remain in effect (meaning this test can be used) for the duration of the COVID-19 declaration under Section 564(b)(1) of the Act, 21 U.S.C. section 360bbb-3(b)(1), unless the authorization is terminated or revoked sooner.  Performed at [facility], [address] [place], [place] [address]      Labs: BNP (last 3 results) No results for input(s): BNP in the last 8760 hours. Basic Metabolic Panel: Recent Labs  Lab 07/17/20 1503 07/18/20 0513 07/19/20 0543 07/20/20 0558  NA 138 140 141 140  K 3.3* 4.0 3.1* 3.0*  CL 101 104 106 107  CO2 30 24 28 25   GLUCOSE 96 104* 97 98  BUN 18 17 14 11   CREATININE 1.54* 1.58* 1.49* 1.24*  CALCIUM 10.4* 9.6 9.1 9.2   Liver Function Tests: Recent Labs  Lab 07/17/20 1503  AST 19  ALT 13  ALKPHOS 53  BILITOT 0.7  PROT 8.0  ALBUMIN 4.0   Recent Labs  Lab 07/17/20 1503  LIPASE 57*   No results for input(s): AMMONIA in the last 168 hours. CBC: Recent Labs  Lab 07/17/20 1503 07/18/20 0513 07/19/20 0543 07/20/20 0558  WBC 9.0 9.2 6.4 7.4  NEUTROABS 5.0  --   --   --   HGB 10.9* 10.1* 9.3* 9.7*  HCT 33.1* 31.1* 29.1* 29.0*  MCV 83.6 85.4 85.3 83.1  PLT 347 296 270 268   Cardiac Enzymes: No results for input(s): CKTOTAL, CKMB, CKMBINDEX, TROPONINI in the last 168 hours. BNP: Invalid input(s): POCBNP CBG: No results for input(s): GLUCAP in the last 168 hours. D-Dimer No results for input(s): DDIMER in the last 72 hours. Hgb A1c No results for input(s): HGBA1C in the last 72 hours. Lipid Profile No results for  input(s): CHOL, HDL, LDLCALC, TRIG, CHOLHDL, LDLDIRECT in the last 72 hours. Thyroid function studies Recent Labs    07/17/20 1511  TSH 5.675*   Anemia work up No results for input(s): VITAMINB12, FOLATE, FERRITIN, TIBC, IRON, RETICCTPCT in the last 72 hours. Urinalysis    Component Value Date/Time   COLORURINE STRAW (A) 07/17/2020 1511   APPEARANCEUR CLEAR (A) 07/17/2020 1511   LABSPEC 1.005 07/17/2020 1511   PHURINE 6.0 07/17/2020 1511   GLUCOSEU NEGATIVE 07/17/2020 1511   HGBUR  NEGATIVE 07/17/2020 [place] 07/17/2020 1511   BILIRUBINUR neg 03/20/2019 1148   [place] 07/17/2020 1511   PROTEINUR NEGATIVE 07/17/2020 1511   UROBILINOGEN negative (A) 03/20/2019 1148   NITRITE NEGATIVE 07/17/2020 1511   LEUKOCYTESUR TRACE (A) 07/17/2020 1511   Sepsis Labs Invalid input(s): PROCALCITONIN,  WBC,  LACTICIDVEN Microbiology Recent Results (from the past 240 hour(s))  SARS Coronavirus 2 by RT PCR (hospital order, performed in [place] hospital lab) Nasopharyngeal Nasopharyngeal Swab     Status: None   Collection Time: 07/17/20 11:45 PM   Specimen: Nasopharyngeal Swab  Result Value Ref Range Status   SARS Coronavirus 2 NEGATIVE NEGATIVE Final    Comment: (NOTE) SARS-CoV-2 target nucleic acids are NOT DETECTED.  The SARS-CoV-2 RNA is generally detectable in upper and lower respiratory specimens during the acute phase of infection. The lowest concentration of SARS-CoV-2 viral copies this assay can detect is 250 copies / mL. A negative result does not preclude SARS-CoV-2 infection and should not be used as the sole basis for treatment or other patient management decisions.  A negative result may occur with improper specimen collection / handling, submission of specimen other than nasopharyngeal swab, presence of viral mutation(s) within the areas targeted by this assay, and inadequate number of viral copies (<250 copies / mL). A negative result must  be combined with clinical observations, patient history, and epidemiological information.  Fact Sheet for Patients:   StrictlyIdeas.no  Fact Sheet for Healthcare Providers: BankingDealers.co.za  This test is not yet approved or  cleared by the [place] FDA and has been authorized for detection and/or diagnosis of SARS-CoV-2 by FDA under an Emergency Use Authorization (EUA).  This EUA will remain in effect (meaning this test can be used) for the duration of the COVID-19 declaration under Section 564(b)(1) of the Act, 21 U.S.C. section 360bbb-3(b)(1), unless the authorization is terminated or revoked sooner.  Performed at [facility], [address] [place], [place] [address]      Time coordinating discharge: Over 30 minutes  SIGNED:   Wyvonnia Dusky, MD  [facility] 07/20/2020, 12:07 PM Pager   If 7PM-7AM, please contact night-coverage [email or web address]

## 2020-07-21 DIAGNOSIS — M25461 Effusion, right knee: Secondary | ICD-10-CM | POA: Diagnosis not present

## 2020-07-21 DIAGNOSIS — M170 Bilateral primary osteoarthritis of knee: Secondary | ICD-10-CM | POA: Diagnosis not present

## 2020-07-21 DIAGNOSIS — G8929 Other chronic pain: Secondary | ICD-10-CM | POA: Diagnosis not present

## 2020-07-21 DIAGNOSIS — M25561 Pain in right knee: Secondary | ICD-10-CM | POA: Diagnosis not present

## 2020-07-21 DIAGNOSIS — M25562 Pain in left knee: Secondary | ICD-10-CM | POA: Diagnosis not present

## 2020-07-22 LAB — H. PYLORI ANTIGEN, STOOL: H. Pylori Stool Ag, Eia: NEGATIVE

## 2020-07-29 ENCOUNTER — Telehealth: Payer: Self-pay

## 2020-07-29 ENCOUNTER — Encounter: Payer: Self-pay | Admitting: Gastroenterology

## 2020-07-29 ENCOUNTER — Other Ambulatory Visit: Payer: Self-pay

## 2020-07-29 ENCOUNTER — Ambulatory Visit (INDEPENDENT_AMBULATORY_CARE_PROVIDER_SITE_OTHER): Payer: Medicare HMO | Admitting: Gastroenterology

## 2020-07-29 VITALS — BP 106/67 | HR 87 | Temp 98.3°F | Ht 60.0 in | Wt 201.2 lb

## 2020-07-29 DIAGNOSIS — D649 Anemia, unspecified: Secondary | ICD-10-CM | POA: Diagnosis not present

## 2020-07-29 DIAGNOSIS — M1712 Unilateral primary osteoarthritis, left knee: Secondary | ICD-10-CM | POA: Diagnosis not present

## 2020-07-29 DIAGNOSIS — M25461 Effusion, right knee: Secondary | ICD-10-CM | POA: Diagnosis not present

## 2020-07-29 DIAGNOSIS — R109 Unspecified abdominal pain: Secondary | ICD-10-CM

## 2020-07-29 DIAGNOSIS — M170 Bilateral primary osteoarthritis of knee: Secondary | ICD-10-CM | POA: Diagnosis not present

## 2020-07-29 DIAGNOSIS — G8929 Other chronic pain: Secondary | ICD-10-CM | POA: Diagnosis not present

## 2020-07-29 DIAGNOSIS — M1711 Unilateral primary osteoarthritis, right knee: Secondary | ICD-10-CM | POA: Diagnosis not present

## 2020-07-29 DIAGNOSIS — M25561 Pain in right knee: Secondary | ICD-10-CM | POA: Diagnosis not present

## 2020-07-29 DIAGNOSIS — M25562 Pain in left knee: Secondary | ICD-10-CM | POA: Diagnosis not present

## 2020-07-29 MED ORDER — NA SULFATE-K SULFATE-MG SULF 17.5-3.13-1.6 GM/177ML PO SOLN: ORAL | 0 refills | Status: DC

## 2020-07-29 NOTE — Telephone Encounter (Signed)
Blood thinner request was faxed to Dr. Barbarann Ehlers office. Awaiting on response.

## 2020-07-29 NOTE — Progress Notes (Signed)
Latoya Antigua, MD [address]  [place]  [place], [place] [address]  Main: [phone]  Fax: [phone]   Primary Care Physician: [name], MD   Chief Complaint  Patient presents with  . Abdominal Pain  . Constipation    HPI: Latoya Freeman is a 79 y.o. female here for follow-up of abdominal pain.  Since latest admission, patient went to the ER for abdominal pain and was admitted.  CTA showed greater than 50% celiac stenosis, patient was evaluated by vascular surgery who did not think this attributed to her abdominal pain did not recommend further intervention.  Patient continues to report bilateral lower quadrant and epigastric abdominal pain despite treatment with Linzess for constipation and having regular bowel movements.  Also on PPI and H. pylori stool antigen negative.  Previous history from previous visit: When inquired about her symptoms, she reports bilateral lower quadrant abdominal pain since November 2020.  States it worsens with meals, improves with certain stretches.  No weight loss.  States she thinks this "ulcer" pain.  However, when I discussed the stomach ulcers usually cause pain in the upper abdomen region, then she stated, that sometimes she does have pain in the bilateral upper quadrant region, which is a different location than she gave and her initial history above.  Review of her previous record, and Dr. Ricky Stabs note from May 2021 states that patient has history of Barrett's esophagus with surveillance due in March 2022.  They do report history of right lower quadrant abdominal pain chronic constipation.  "Summary of evaluation: - CSY: 01/2018 - pandiverticulosis, one 3 mm polyp at hepatic flexure with absent tissue on biopsy, and one 4 mm polyp at splenic flexure with path showing tubular adenoma - EGD: 01/2018 - short-segment Barrett's esophagus, erosive gastritis with minimal chronic gastritis, single gastric polyp with minimal chronic  inflammation, normal duodenum "  Please see their detailed notes.  They recommended peppermint oil after last visit.  Patient reports small stool as well.  Was taking Colace but continued to have constipation has been using Dulcolax suppositories as needed.  Has tried MiraLAX in the past with inconsistent results.  No blood in stool.  See a recent CT scan from April 2021 which did not show any acute abnormalities  Current Outpatient Medications  Medication Sig Dispense Refill  . amLODipine (NORVASC) 5 MG tablet Take 5 mg by mouth daily.     Marland Kitchen apixaban (ELIQUIS) 5 MG TABS tablet Take 5 mg by mouth 2 (two) times daily.    . citalopram (CELEXA) 20 MG tablet Take 20 mg by mouth daily.    Marland Kitchen levothyroxine (SYNTHROID, LEVOTHROID) 75 MCG tablet Take 75 mcg by mouth daily.    Marland Kitchen linaclotide (LINZESS) 145 MCG CAPS capsule Take 1 capsule (145 mcg total) by mouth daily before breakfast. 30 capsule 1  . losartan (COZAAR) 50 MG tablet Take 50 mg by mouth daily.     Marland Kitchen omeprazole (PRILOSEC) 20 MG capsule Take 20 mg by mouth daily.    . potassium chloride (KLOR-CON) 10 MEQ tablet Take 10 mEq by mouth 2 (two) times daily.    Marland Kitchen triamterene-hydrochlorothiazide (MAXZIDE-25) 37.5-25 MG tablet Take 1 tablet by mouth daily.    Marland Kitchen acetaminophen (TYLENOL) 500 MG tablet Take 1 tablet by mouth every 6 (six) hours as needed. (Patient not taking: Reported on 07/29/2020)     No current facility-administered medications for this visit.    Allergies as of 07/29/2020 - Review Complete 07/29/2020  Allergen Reaction Noted  . Bee venom Swelling 01/31/2018  . Shellfish allergy Swelling 01/31/2018  . Tamoxifen Hives 07/20/2018  . Penicillins Hives, Rash, and Other (See Comments) 12/21/2012    ROS:  General: Negative for anorexia, weight loss, fever, chills, fatigue, weakness. ENT: Negative for hoarseness, difficulty swallowing , nasal congestion. CV: Negative for chest pain, angina, palpitations, dyspnea on exertion,  peripheral edema.  Respiratory: Negative for dyspnea at rest, dyspnea on exertion, cough, sputum, wheezing.  GI: See history of present illness. GU:  Negative for dysuria, hematuria, urinary incontinence, urinary frequency, nocturnal urination.  Endo: Negative for unusual weight change.    Physical Examination:   BP 106/67   Pulse 87   Temp 98.3 F (36.8 C) (Oral)   Ht 5' (1.524 m)   Wt 201 lb 3.2 oz (91.3 kg)   BMI 39.29 kg/m   General: Well-nourished, well-developed in no acute distress.  Eyes: No icterus. Conjunctivae pink. Mouth: Oropharyngeal mucosa moist and pink , no lesions erythema or exudate. Neck: Supple, Trachea midline Abdomen: Bowel sounds are normal, nontender, nondistended, no hepatosplenomegaly or masses, no abdominal bruits or hernia , no rebound or guarding.   Extremities: No lower extremity edema. No clubbing or deformities. Neuro: Alert and oriented x 3.  Grossly intact. Skin: Warm and dry, no jaundice.   Psych: Alert and cooperative, normal mood and affect.   Labs: CMP     Component Value Date/Time   NA 140 07/20/2020 0558   K 3.0 (L) 07/20/2020 0558   K 3.0 (L) 02/14/2014 1447   CL 107 07/20/2020 0558   CO2 25 07/20/2020 0558   GLUCOSE 98 07/20/2020 0558   BUN 11 07/20/2020 0558   CREATININE 1.24 (H) 07/20/2020 0558   CALCIUM 9.2 07/20/2020 0558   PROT 8.0 07/17/2020 1503   ALBUMIN 4.0 07/17/2020 1503   AST 19 07/17/2020 1503   ALT 13 07/17/2020 1503   ALKPHOS 53 07/17/2020 1503   BILITOT 0.7 07/17/2020 1503   GFRNONAA 42 (L) 07/20/2020 0558   GFRAA 48 (L) 07/20/2020 0558   Lab Results  Component Value Date   WBC 7.4 07/20/2020   HGB 9.7 (L) 07/20/2020   HCT 29.0 (L) 07/20/2020   MCV 83.1 07/20/2020   PLT 268 07/20/2020    Imaging Studies: [place] MESENTERIC ARTERIES  Result Date: 07/14/2020 CLINICAL DATA:  79 year old female with pain after eating EXAM: [place] MESENTERIC ARTERIAL DOPPLER COMPARISON:  No prior duplex FINDINGS: Celiac axis:  181 cm/sec Celiac axis with inspiration: 181 cm/sec Celiac axis with expiration: 55 cm/sec Splenic artery: 104 cm/sec Hepatic artery: 122 cm/sec SMA: 109 cm/sec proximal, 409 centimeter/second mid segment. IMA: Not visualized Aorta: 46 cm/sec Aortic size: 2.2 cm proximally, 1.7 cm in the mid segment and 1.3 cm distally IMPRESSION: Directed duplex of the mesenteric arteries demonstrates no ostial stenosis of celiac artery or SMA. IMA not sampled. Elevated velocity in the mid segment of the SMA is recorded, though this may represent artifact. Signed, Dulcy Fanny. Dellia Nims, RPVI [facility] Electronically Signed   By: Corrie Mckusick D.O.   On: 07/14/2020 11:25   CT Angio Abd/Pel W and/or Wo Contrast  Result Date: 07/17/2020 CLINICAL DATA:  Abdominal pain EXAM: CTA ABDOMEN AND PELVIS WITHOUT AND WITH CONTRAST TECHNIQUE: Multidetector CT imaging of the abdomen and pelvis was performed using the standard protocol during bolus administration of intravenous contrast. Multiplanar reconstructed images and MIPs were obtained and reviewed to evaluate the vascular anatomy. CONTRAST:  62mL OMNIPAQUE IOHEXOL 350 MG/ML SOLN COMPARISON:  Ultrasound 07/14/2020.  CT 03/08/2020 FINDINGS: VASCULAR Aorta: Aortic atherosclerosis.  No aneurysm or dissection. Celiac: Mild-to-moderate stenosis just beyond the ostium, likely greater than 50%. SMA: Widely patent. Renals: Patent. Probable mild narrowing at the origin of the left renal artery. Right renal artery widely patent. IMA: Patent.  No visible stenosis. Inflow: Atherosclerosis. No aneurysm or significant stenosis. No dissection. Proximal Outflow: Atherosclerosis. No aneurysm, dissection or significant stenosis. Veins: No obvious venous abnormality within the limitations of this arterial phase study. Review of the MIP images confirms the above findings. NON-VASCULAR Lower chest: No acute abnormality. Coronary artery disease and  aortic atherosclerosis. Hepatobiliary: Scattered small hypodensities in the liver compatible with cysts. Prior cholecystectomy. Pancreas: No focal abnormality or ductal dilatation. Spleen: No focal abnormality.  Normal size. Adrenals/Urinary Tract: Bilateral renal cysts. No suspicious renal or adrenal lesion. No stones or hydronephrosis. Urinary bladder unremarkable. Stomach/Bowel: Normal appendix. Stomach, large and small bowel grossly unremarkable. Lymphatic: No adenopathy Reproductive: Small left ovarian cyst measuring 2.5 cm, stable since prior study. Uterus and right adnexa unremarkable. Other: No free fluid or free air. Musculoskeletal: No acute bony abnormality. IMPRESSION: VASCULAR Aortoiliac atherosclerosis.  No aneurysm or dissection. Mild to moderate narrowing just beyond the origin of the celiac artery, likely 50% or greater. No other mesenteric stenosis. Suspect mild stenosis at the origin of the left renal artery. NON-VASCULAR Hepatic and renal cysts. No bowel changes of ischemia or inflammation. No acute findings. Electronically Signed   By: Rolm Baptise M.D.   On: 07/17/2020 22:21    Assessment and Plan:   TIRSA GAIL is a 79 y.o. y/o female here for follow-up of abdominal pain  Due to ongoing abdominal pain, patient would like further evaluation  EGD and colonoscopy indicated at this time due to ongoing abdominal pain both in the bilateral upper quadrant region and epigastric area  Patient also has anemia, normocytic.  Iron saturation was low at 7 in June 2021.  Obtain ferritin and iron labs at this time  I have discussed alternative options, risks & benefits,  which include, but are not limited to, bleeding, infection, perforation,respiratory complication & drug reaction.  The patient agrees with this plan & written consent will be obtained.    Patient has an obese abdomen.  I have recommended that she start using an abdominal binder as some of her pain may be related to  musculoskeletal etiology, however given ongoing symptoms, further evaluation also recommended.   Dr Latoya Freeman

## 2020-07-30 ENCOUNTER — Telehealth: Payer: Self-pay

## 2020-07-30 DIAGNOSIS — D649 Anemia, unspecified: Secondary | ICD-10-CM

## 2020-07-30 LAB — FERRITIN: Ferritin: 12 ng/mL — ABNORMAL LOW (ref 15–150)

## 2020-07-30 LAB — IRON AND TIBC
Iron Saturation: 14 % — ABNORMAL LOW (ref 15–55)
Iron: 49 ug/dL (ref 27–139)
Total Iron Binding Capacity: 361 ug/dL (ref 250–450)
UIBC: 312 ug/dL (ref 118–369)

## 2020-07-30 NOTE — Telephone Encounter (Signed)
-----   Message from Virgel Manifold, MD sent at 07/30/2020  8:50 AM EDT ----- Herb Grays please let the patient know, her iron level is low. I recommend referral to Dr. Janese Banks for it. And we will be able to further evaluate amu causes for IDA during her EGD and colonoscopy

## 2020-07-30 NOTE — Telephone Encounter (Signed)
Called patient to let her know that she is iron deficient, therefore, she will be referred to Dr. Janese Banks to be seen. Patient had already been scheduled for a dual.

## 2020-07-31 NOTE — Telephone Encounter (Signed)
Dr. Kary Kos replied back to the blood thinner information sheet and he stated that the patient was able to stop her Eliquis 3 days prior to procedure and restart 1 day after procedure. Patient was informed and understood.

## 2020-08-04 ENCOUNTER — Inpatient Hospital Stay: Payer: Medicare HMO | Admitting: Oncology

## 2020-08-04 ENCOUNTER — Telehealth: Payer: Self-pay | Admitting: Oncology

## 2020-08-04 ENCOUNTER — Other Ambulatory Visit: Payer: Self-pay

## 2020-08-04 ENCOUNTER — Other Ambulatory Visit
Admission: RE | Admit: 2020-08-04 | Discharge: 2020-08-04 | Disposition: A | Discharge status: Home / Self Care | Payer: Medicare HMO | Source: Ambulatory Visit | Attending: Gastroenterology | Admitting: Gastroenterology

## 2020-08-04 DIAGNOSIS — Z01812 Encounter for preprocedural laboratory examination: Secondary | ICD-10-CM | POA: Diagnosis not present

## 2020-08-04 DIAGNOSIS — Z20822 Contact with and (suspected) exposure to covid-19: Secondary | ICD-10-CM | POA: Insufficient documentation

## 2020-08-04 NOTE — Telephone Encounter (Signed)
Thanks

## 2020-08-04 NOTE — Telephone Encounter (Signed)
Patient's daughter phoned on this date and stated that she wanted to reschedule patient's appt on this date as patient was not feeling well and would also have scopes on 08-06-20 and wanted to reschedule to after this. Appts rescheduled to 08-15-20.

## 2020-08-05 ENCOUNTER — Ambulatory Visit (INDEPENDENT_AMBULATORY_CARE_PROVIDER_SITE_OTHER): Payer: Medicare HMO | Admitting: Vascular Surgery

## 2020-08-05 ENCOUNTER — Encounter (INDEPENDENT_AMBULATORY_CARE_PROVIDER_SITE_OTHER): Payer: Self-pay | Admitting: Vascular Surgery

## 2020-08-05 VITALS — BP 135/75 | HR 73 | Resp 16 | Ht 60.0 in | Wt 196.6 lb

## 2020-08-05 DIAGNOSIS — I774 Celiac artery compression syndrome: Secondary | ICD-10-CM | POA: Diagnosis not present

## 2020-08-05 DIAGNOSIS — M1711 Unilateral primary osteoarthritis, right knee: Secondary | ICD-10-CM | POA: Diagnosis not present

## 2020-08-05 DIAGNOSIS — N1832 Chronic kidney disease, stage 3b: Secondary | ICD-10-CM | POA: Diagnosis not present

## 2020-08-05 DIAGNOSIS — R109 Unspecified abdominal pain: Secondary | ICD-10-CM | POA: Insufficient documentation

## 2020-08-05 DIAGNOSIS — M1712 Unilateral primary osteoarthritis, left knee: Secondary | ICD-10-CM | POA: Diagnosis not present

## 2020-08-05 DIAGNOSIS — R1084 Generalized abdominal pain: Secondary | ICD-10-CM

## 2020-08-05 DIAGNOSIS — M25461 Effusion, right knee: Secondary | ICD-10-CM | POA: Diagnosis not present

## 2020-08-05 DIAGNOSIS — M25561 Pain in right knee: Secondary | ICD-10-CM | POA: Diagnosis not present

## 2020-08-05 DIAGNOSIS — M25562 Pain in left knee: Secondary | ICD-10-CM | POA: Diagnosis not present

## 2020-08-05 DIAGNOSIS — M170 Bilateral primary osteoarthritis of knee: Secondary | ICD-10-CM | POA: Diagnosis not present

## 2020-08-05 DIAGNOSIS — I771 Stricture of artery: Secondary | ICD-10-CM | POA: Insufficient documentation

## 2020-08-05 DIAGNOSIS — I2699 Other pulmonary embolism without acute cor pulmonale: Secondary | ICD-10-CM | POA: Diagnosis not present

## 2020-08-05 DIAGNOSIS — G8929 Other chronic pain: Secondary | ICD-10-CM | POA: Diagnosis not present

## 2020-08-05 LAB — SARS CORONAVIRUS 2 (TAT 6-24 HRS): SARS Coronavirus 2: NEGATIVE

## 2020-08-05 NOTE — H&P (View-Only) (Signed)
Patient ID: Latoya Freeman, female   DOB: 1941/10/22, 79 y.o.   MRN: [id number]  Chief Complaint  Patient presents with  . New Patient (Initial Visit)    ref [place] abd pain    HPI Latoya Freeman is a 79 y.o. female.  I am asked to see the patient by Dr. 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  She was actually seen by our service in consultation about a month ago when she was in the hospital with severe abdominal pain.  This is not classic chronic mesenteric ischemia although pain does sometimes worsen with eating.  No unintentional weight loss.  No food fear.  The pain is sometimes epigastric, sometimes left lower quadrant, and sometimes right lower quadrant.  She has lots of digestive issues as well.  She is actually scheduled for an upper and lower endoscopy tomorrow.  I have independently reviewed her CT scan from about 3 weeks ago.  The celiac artery does have some degree of narrowing although it does not appear to be classic atherosclerotic.  It is also not a clear median arcuate ligament situation.  The superior mesenteric artery and IMA both appear to be patent without significant stenosis.     Past Medical History:  Diagnosis Date  . Aneurysm ([place]) 1980  . Cancer ([place])   . GERD (gastroesophageal reflux disease)   . Hypertension 1980  . Hypothyroidism 1999  . Pneumonia   . Pulmonary embolism ([place])   . Shingles   . Wears dentures    full upper and lower    Past Surgical History:  Procedure Laterality Date  . BRAIN SURGERY     aneurysm  . BREAST CYST ASPIRATION Left 2005  . BREAST EXCISIONAL BIOPSY Left 2013   atypical ductal hyperplasia  . BREAST SURGERY  2013   LF Breast Wide EXC   . CHOLECYSTECTOMY  2004  . COLONOSCOPY    . COLONOSCOPY WITH PROPOFOL N/A 01/31/2018   Procedure: COLONOSCOPY WITH PROPOFOL;  Surgeon: Lollie Sails, MD;  Location: [facility] ENDOSCOPY;  Service: Endoscopy;  Laterality: N/A;  . DILATION AND CURETTAGE OF UTERUS N/A  05/03/2019   Procedure: DILATATION AND CURETTAGE;  Surgeon: Gae Dry, MD;  Location: [facility] ORS;  Service: Gynecology;  Laterality: N/A;  . ESOPHAGOGASTRODUODENOSCOPY N/A 01/31/2018   Procedure: ESOPHAGOGASTRODUODENOSCOPY (EGD);  Surgeon: Lollie Sails, MD;  Location: [facility] ENDOSCOPY;  Service: Endoscopy;  Laterality: N/A;  . SHOULDER ARTHROSCOPY Left 07/25/2018   Procedure: SHOULDER MINI OPEN ROTATOR CUFF REPAIR  BICEPS TENDOSIS ARTHROSCOPIC DISTAL CLAVICLE EXCISION  SUBACROMIAL DECOMP;  Surgeon: Leim Fabry, MD;  Location: [place];  Service: Orthopedics;  Laterality: Left;  BEACH CHAIR WITH SPYDER SMITH & NEPHEW HEAD COIL ANCHOR FOOTPRINT ANCHOR QFIX ANCHOR     Family History  Problem Relation Age of Onset  . Breast cancer Neg Hx   No bleeding disorders, clotting disorders, aneurysms, or porphyria   Social History   Tobacco Use  . Smoking status: Former Smoker    Packs/day: 1.50    Years: 20.00    Pack years: 30.00    Quit date: 1990    Years since quitting: 31.7  . Smokeless tobacco: Never Used  Vaping Use  . Vaping Use: Never used  Substance Use Topics  . Alcohol use: No  . Drug use: No     Allergies  Allergen Reactions  . Bee Venom Swelling  . Shellfish Allergy Swelling  . Tamoxifen Hives  . Penicillins Hives, Rash  and Other (See Comments)    Has patient had a PCN reaction causing immediate rash, facial/tongue/throat swelling, SOB or lightheadedness with hypotension: Unknown Has patient had a PCN reaction causing severe rash involving mucus membranes or skin necrosis: Unknown Has patient had a PCN reaction that required hospitalization: Unknown Has patient had a PCN reaction occurring within the last 10 years: No If all of the above answers are "NO", then may proceed with Cephalosporin use.     Current Outpatient Medications  Medication Sig Dispense Refill  . amLODipine (NORVASC) 5 MG tablet Take 5 mg by mouth daily.     Marland Kitchen apixaban  (ELIQUIS) 5 MG TABS tablet Take 5 mg by mouth 2 (two) times daily.    . citalopram (CELEXA) 20 MG tablet Take 20 mg by mouth daily.    Marland Kitchen levothyroxine (SYNTHROID, LEVOTHROID) 75 MCG tablet Take 75 mcg by mouth daily.    Marland Kitchen linaclotide (LINZESS) 145 MCG CAPS capsule Take 1 capsule (145 mcg total) by mouth daily before breakfast. 30 capsule 1  . losartan (COZAAR) 50 MG tablet Take 50 mg by mouth daily.     . Na Sulfate-K Sulfate-Mg Sulf 17.5-3.13-1.6 GM/177ML SOLN At 5 PM the day before procedure take 1 bottle and 5 hours before procedure take 1 bottle. 354 mL 0  . omeprazole (PRILOSEC) 20 MG capsule Take 20 mg by mouth daily.    . potassium chloride (KLOR-CON) 10 MEQ tablet Take 10 mEq by mouth 2 (two) times daily.    Marland Kitchen triamterene-hydrochlorothiazide (MAXZIDE-25) 37.5-25 MG tablet Take 1 tablet by mouth daily.    Marland Kitchen acetaminophen (TYLENOL) 500 MG tablet Take 1 tablet by mouth every 6 (six) hours as needed. (Patient not taking: Reported on 07/29/2020)     No current facility-administered medications for this visit.      REVIEW OF SYSTEMS (Negative unless checked)  Constitutional: [] Weight loss  [] Fever  [] Chills Cardiac: [] Chest pain   [] Chest pressure   [] Palpitations   [] Shortness of breath when laying flat   [] Shortness of breath at rest   [] Shortness of breath with exertion. Vascular:  [] Pain in legs with walking   [] Pain in legs at rest   [] Pain in legs when laying flat   [] Claudication   [] Pain in feet when walking  [] Pain in feet at rest  [] Pain in feet when laying flat   [x] History of DVT   [] Phlebitis   [] Swelling in legs   [] Varicose veins   [] Non-healing ulcers Pulmonary:   [] Uses home oxygen   [] Productive cough   [] Hemoptysis   [] Wheeze  [] COPD   [] Asthma Neurologic:  [] Dizziness  [] Blackouts   [] Seizures   [] History of stroke   [] History of TIA  [] Aphasia   [] Temporary blindness   [] Dysphagia   [] Weakness or numbness in arms   [] Weakness or numbness in legs Musculoskeletal:   [x] Arthritis   [] Joint swelling   [] Joint pain   [] Low back pain Hematologic:  [] Easy bruising  [] Easy bleeding   [] Hypercoagulable state   [] Anemic  [] Hepatitis Gastrointestinal:  [] Blood in stool   [] Vomiting blood  [x] Gastroesophageal reflux/heartburn   [x] Abdominal pain Genitourinary:  [] Chronic kidney disease   [] Difficult urination  [] Frequent urination  [] Burning with urination   [] Hematuria Skin:  [] Rashes   [] Ulcers   [] Wounds Psychological:  [] History of anxiety   []  History of major depression.    Physical Exam BP 135/75 (BP Location: Left Arm)   Pulse 73   Resp 16   Ht 5' (1.524  m)   Wt 196 lb 9.6 oz (89.2 kg)   BMI 38.40 kg/m  Gen:  WD/WN, NAD Head: [place]/AT, No temporalis wasting. Ear/Nose/Throat: Hearing grossly intact, nares w/o erythema or drainage, oropharynx w/o Erythema/Exudate Eyes: Conjunctiva clear, sclera non-icteric  Neck: trachea midline.  No JVD.  Pulmonary:  Good air movement, respirations not labored, no use of accessory muscles  Cardiac: RRR, no JVD Vascular:  Vessel Right Left  Radial Palpable Palpable                                   Gastrointestinal:. No masses, surgical incisions, or scars.  Soft and nontender currently Musculoskeletal: M/S 5/5 throughout.  Extremities without ischemic changes.  No deformity or atrophy.  No edema. Neurologic: Sensation grossly intact in extremities.  Symmetrical.  Speech is fluent. Motor exam as listed above. Psychiatric: Judgment intact, Mood & affect appropriate for pt's clinical situation. Dermatologic: No rashes or ulcers noted.  No cellulitis or open wounds.    Radiology [place] MESENTERIC ARTERIES  Result Date: 07/14/2020 CLINICAL DATA:  79 year old female with pain after eating EXAM: [place] MESENTERIC ARTERIAL DOPPLER COMPARISON:  No prior duplex FINDINGS: Celiac axis: 181 cm/sec Celiac axis with inspiration: 181 cm/sec Celiac axis with expiration: 55 cm/sec Splenic artery: 104 cm/sec Hepatic artery: 122  cm/sec SMA: 109 cm/sec proximal, 409 centimeter/second mid segment. IMA: Not visualized Aorta: 46 cm/sec Aortic size: 2.2 cm proximally, 1.7 cm in the mid segment and 1.3 cm distally IMPRESSION: Directed duplex of the mesenteric arteries demonstrates no ostial stenosis of celiac artery or SMA. IMA not sampled. Elevated velocity in the mid segment of the SMA is recorded, though this may represent artifact. Signed, Dulcy Fanny. Dellia Nims, RPVI [facility] Radiology Electronically Signed   By: Corrie Mckusick D.O.   On: 07/14/2020 11:25   CT Angio Abd/Pel W and/or Wo Contrast  Result Date: 07/17/2020 CLINICAL DATA:  Abdominal pain EXAM: CTA ABDOMEN AND PELVIS WITHOUT AND WITH CONTRAST TECHNIQUE: Multidetector CT imaging of the abdomen and pelvis was performed using the standard protocol during bolus administration of intravenous contrast. Multiplanar reconstructed images and MIPs were obtained and reviewed to evaluate the vascular anatomy. CONTRAST:  54mL OMNIPAQUE IOHEXOL 350 MG/ML SOLN COMPARISON:  Ultrasound 07/14/2020.  CT 03/08/2020 FINDINGS: VASCULAR Aorta: Aortic atherosclerosis.  No aneurysm or dissection. Celiac: Mild-to-moderate stenosis just beyond the ostium, likely greater than 50%. SMA: Widely patent. Renals: Patent. Probable mild narrowing at the origin of the left renal artery. Right renal artery widely patent. IMA: Patent.  No visible stenosis. Inflow: Atherosclerosis. No aneurysm or significant stenosis. No dissection. Proximal Outflow: Atherosclerosis. No aneurysm, dissection or significant stenosis. Veins: No obvious venous abnormality within the limitations of this arterial phase study. Review of the MIP images confirms the above findings. NON-VASCULAR Lower chest: No acute abnormality. Coronary artery disease and aortic atherosclerosis. Hepatobiliary: Scattered small hypodensities in the liver compatible with cysts. Prior cholecystectomy. Pancreas:  No focal abnormality or ductal dilatation. Spleen: No focal abnormality.  Normal size. Adrenals/Urinary Tract: Bilateral renal cysts. No suspicious renal or adrenal lesion. No stones or hydronephrosis. Urinary bladder unremarkable. Stomach/Bowel: Normal appendix. Stomach, large and small bowel grossly unremarkable. Lymphatic: No adenopathy Reproductive: Small left ovarian cyst measuring 2.5 cm, stable since prior study. Uterus and right adnexa unremarkable. Other: No free fluid or free air. Musculoskeletal: No acute bony abnormality. IMPRESSION: VASCULAR Aortoiliac atherosclerosis.  No aneurysm or dissection.  Mild to moderate narrowing just beyond the origin of the celiac artery, likely 50% or greater. No other mesenteric stenosis. Suspect mild stenosis at the origin of the left renal artery. NON-VASCULAR Hepatic and renal cysts. No bowel changes of ischemia or inflammation. No acute findings. Electronically Signed   By: Rolm Baptise M.D.   On: 07/17/2020 22:21    Labs Recent Results (from the past 2160 hour(s))  Basic metabolic panel     Status: Abnormal   Collection Time: 06/08/20  2:56 PM  Result Value Ref Range   Sodium 139 135 - 145 mmol/L   Potassium 3.5 3.5 - 5.1 mmol/L   Chloride 101 98 - 111 mmol/L   CO2 25 22 - 32 mmol/L   Glucose, Bld 75 70 - 99 mg/dL    Comment: Glucose reference range applies only to samples taken after fasting for at least 8 hours.   BUN 21 8 - 23 mg/dL   Creatinine, Ser 1.84 (H) 0.44 - 1.00 mg/dL   Calcium 10.4 (H) 8.9 - 10.3 mg/dL   GFR calc non Af Amer 26 (L) >60 mL/min   GFR calc Af Amer 30 (L) >60 mL/min   Anion gap 13 5 - 15    Comment: Performed at [facility], [address] [place], [place] [address]  CBC     Status: Abnormal   Collection Time: 06/08/20  2:56 PM  Result Value Ref Range   WBC 8.9 4.0 - 10.5 K/uL   RBC 4.36 3.87 - 5.11 MIL/uL   Hemoglobin 11.6 (L) 12.0 - 15.0 g/dL   HCT 36.0 36 - 46 %   MCV 82.6 80.0 - 100.0 fL   MCH 26.6  26.0 - 34.0 pg   MCHC 32.2 30.0 - 36.0 g/dL   RDW 16.3 (H) 11.5 - 15.5 %   Platelets 313 150 - 400 K/uL   nRBC 0.0 0.0 - 0.2 %    Comment: Performed at [facility], [place], [place] [address]  Troponin I (High Sensitivity)     Status: None   Collection Time: 06/08/20  2:56 PM  Result Value Ref Range   Troponin I (High Sensitivity) 6 <18 ng/L    Comment: (NOTE) Elevated high sensitivity troponin I (hsTnI) values and significant  changes across serial measurements may suggest ACS but many other  chronic and acute conditions are known to elevate hsTnI results.  Refer to the "Links" section for chest pain algorithms and additional  guidance. Performed at [facility], [place], [place] [address]   Troponin I (High Sensitivity)     Status: None   Collection Time: 06/08/20  5:28 PM  Result Value Ref Range   Troponin I (High Sensitivity) 5 <18 ng/L    Comment: (NOTE) Elevated high sensitivity troponin I (hsTnI) values and significant  changes across serial measurements may suggest ACS but many other  chronic and acute conditions are known to elevate hsTnI results.  Refer to the "Links" section for chest pain algorithms and additional  guidance. Performed at [facility], [address] [place], [place] [address]   Hepatic function panel     Status: None   Collection Time: 06/08/20  5:28 PM  Result Value Ref Range   Total Protein 7.7 6.5 - 8.1 g/dL   Albumin 4.0 3.5 - 5.0 g/dL   AST 19 15 - 41 U/L   ALT 18 0 - 44 U/L   Alkaline Phosphatase 51 38 -  126 U/L   Total Bilirubin 0.6 0.3 - 1.2 mg/dL   Bilirubin, Direct <0.1 0.0 - 0.2 mg/dL   Indirect Bilirubin NOT CALCULATED 0.3 - 0.9 mg/dL    Comment: Performed at [facility], [address] [place], [place] [address]  Lipase, blood     Status: None   Collection Time: 06/08/20  5:28 PM  Result Value Ref Range   Lipase 51 11 - 51 U/L    Comment: Performed at  [facility], [address] [place], [place] [address]  Lipase, blood     Status: Abnormal   Collection Time: 07/17/20  3:03 PM  Result Value Ref Range   Lipase 57 (H) 11 - 51 U/L    Comment: Performed at [facility], [address] [place], [place] [address]  Comprehensive metabolic panel     Status: Abnormal   Collection Time: 07/17/20  3:03 PM  Result Value Ref Range   Sodium 138 135 - 145 mmol/L   Potassium 3.3 (L) 3.5 - 5.1 mmol/L   Chloride 101 98 - 111 mmol/L   CO2 30 22 - 32 mmol/L   Glucose, Bld 96 70 - 99 mg/dL    Comment: Glucose reference range applies only to samples taken after fasting for at least 8 hours.   BUN 18 8 - 23 mg/dL   Creatinine, Ser 1.54 (H) 0.44 - 1.00 mg/dL   Calcium 10.4 (H) 8.9 - 10.3 mg/dL   Total Protein 8.0 6.5 - 8.1 g/dL   Albumin 4.0 3.5 - 5.0 g/dL   AST 19 15 - 41 U/L   ALT 13 0 - 44 U/L   Alkaline Phosphatase 53 38 - 126 U/L   Total Bilirubin 0.7 0.3 - 1.2 mg/dL   GFR calc non Af Amer 32 (L) >60 mL/min   GFR calc Af Amer 37 (L) >60 mL/min   Anion gap 7 5 - 15    Comment: Performed at [facility], [address] [place], [place] [address]  CBC with Differential     Status: Abnormal   Collection Time: 07/17/20  3:03 PM  Result Value Ref Range   WBC 9.0 4.0 - 10.5 K/uL   RBC 3.96 3.87 - 5.11 MIL/uL   Hemoglobin 10.9 (L) 12.0 - 15.0 g/dL   HCT 33.1 (L) 36 - 46 %   MCV 83.6 80.0 - 100.0 fL   MCH 27.5 26.0 - 34.0 pg   MCHC 32.9 30.0 - 36.0 g/dL   RDW 15.7 (H) 11.5 - 15.5 %   Platelets 347 150 - 400 K/uL   nRBC 0.0 0.0 - 0.2 %   Neutrophils Relative % 55 %   Neutro Abs 5.0 1.7 - 7.7 K/uL   Lymphocytes Relative 36 %   Lymphs Abs 3.2 0.7 - 4.0 K/uL   Monocytes Relative 7 %   Monocytes Absolute 0.6 0 - 1 K/uL   Eosinophils Relative 1 %   Eosinophils Absolute 0.1 0 - 0 K/uL   Basophils Relative 1 %   Basophils Absolute 0.1 0 - 0 K/uL   Immature Granulocytes 0 %   Abs Immature Granulocytes 0.02 0.00 -  0.07 K/uL    Comment: Performed at [facility], [address] [place], [place] [address]  Urinalysis, Complete w Microscopic     Status: Abnormal   Collection Time: 07/17/20  3:11 PM  Result Value Ref Range   Color, Urine STRAW (A) YELLOW   APPearance CLEAR (A) CLEAR  Specific Gravity, Urine 1.005 1.005 - 1.030   pH 6.0 5.0 - 8.0   Glucose, UA NEGATIVE NEGATIVE mg/dL   Hgb urine dipstick NEGATIVE NEGATIVE   Bilirubin Urine NEGATIVE NEGATIVE   Ketones, ur NEGATIVE NEGATIVE mg/dL   Protein, ur NEGATIVE NEGATIVE mg/dL   Nitrite NEGATIVE NEGATIVE   Leukocytes,Ua TRACE (A) NEGATIVE   RBC / HPF 0-5 0 - 5 RBC/hpf   WBC, UA 6-10 0 - 5 WBC/hpf   Bacteria, UA NONE SEEN NONE SEEN   Squamous Epithelial / LPF NONE SEEN 0 - 5    Comment: Performed at [facility], [address] [place], [place] [address]  TSH     Status: Abnormal   Collection Time: 07/17/20  3:11 PM  Result Value Ref Range   TSH 5.675 (H) 0.350 - 4.500 uIU/mL    Comment: Performed by a 3rd Generation assay with a functional sensitivity of <=0.01 uIU/mL. Performed at [facility], [address] [place], [place] [address]   Lactic acid, plasma     Status: None   Collection Time: 07/17/20  9:48 PM  Result Value Ref Range   Lactic Acid, Venous 1.2 0.5 - 1.9 mmol/L    Comment: Performed at [facility], [address] [place], [place] [address]  SARS Coronavirus 2 by RT PCR (hospital order, performed in [facility] hospital lab) Nasopharyngeal Nasopharyngeal Swab     Status: None   Collection Time: 07/17/20 11:45 PM   Specimen: Nasopharyngeal Swab  Result Value Ref Range   SARS Coronavirus 2 NEGATIVE NEGATIVE    Comment: (NOTE) SARS-CoV-2 target nucleic acids are NOT DETECTED.  The SARS-CoV-2 RNA is generally detectable in upper and lower respiratory specimens during the acute phase of infection. The lowest concentration of SARS-CoV-2 viral copies this assay can detect is  250 copies / mL. A negative result does not preclude SARS-CoV-2 infection and should not be used as the sole basis for treatment or other patient management decisions.  A negative result may occur with improper specimen collection / handling, submission of specimen other than nasopharyngeal swab, presence of viral mutation(s) within the areas targeted by this assay, and inadequate number of viral copies (<250 copies / mL). A negative result must be combined with clinical observations, patient history, and epidemiological information.  Fact Sheet for Patients:   StrictlyIdeas.no  Fact Sheet for Healthcare Providers: BankingDealers.co.za  This test is not yet approved or  cleared by the [place] FDA and has been authorized for detection and/or diagnosis of SARS-CoV-2 by FDA under an Emergency Use Authorization (EUA).  This EUA will remain in effect (meaning this test can be used) for the duration of the COVID-19 declaration under Section 564(b)(1) of the Act, 21 U.S.C. section 360bbb-3(b)(1), unless the authorization is terminated or revoked sooner.  Performed at [facility], [address] [place], [place] [address]   Basic metabolic panel     Status: Abnormal   Collection Time: 07/18/20  5:13 AM  Result Value Ref Range   Sodium 140 135 - 145 mmol/L   Potassium 4.0 3.5 - 5.1 mmol/L   Chloride 104 98 - 111 mmol/L   CO2 24 22 - 32 mmol/L   Glucose, Bld 104 (H) 70 - 99 mg/dL    Comment: Glucose reference range applies only to samples taken after fasting for at least 8 hours.   BUN 17 8 - 23 mg/dL   Creatinine, Ser 1.58 (H) 0.44 - 1.00 mg/dL  Calcium 9.6 8.9 - 10.3 mg/dL   GFR calc non Af Amer 31 (L) >60 mL/min   GFR calc Af Amer 36 (L) >60 mL/min   Anion gap 12 5 - 15    Comment: Performed at [facility], [address] [place], [place] [address]  CBC     Status: Abnormal   Collection Time: 07/18/20   5:13 AM  Result Value Ref Range   WBC 9.2 4.0 - 10.5 K/uL   RBC 3.64 (L) 3.87 - 5.11 MIL/uL   Hemoglobin 10.1 (L) 12.0 - 15.0 g/dL   HCT 31.1 (L) 36 - 46 %   MCV 85.4 80.0 - 100.0 fL   MCH 27.7 26.0 - 34.0 pg   MCHC 32.5 30.0 - 36.0 g/dL   RDW 15.7 (H) 11.5 - 15.5 %   Platelets 296 150 - 400 K/uL   nRBC 0.0 0.0 - 0.2 %    Comment: Performed at [facility], [address] [place], [place] [address]  CBC     Status: Abnormal   Collection Time: 07/19/20  5:43 AM  Result Value Ref Range   WBC 6.4 4.0 - 10.5 K/uL   RBC 3.41 (L) 3.87 - 5.11 MIL/uL   Hemoglobin 9.3 (L) 12.0 - 15.0 g/dL   HCT 29.1 (L) 36 - 46 %   MCV 85.3 80.0 - 100.0 fL   MCH 27.3 26.0 - 34.0 pg   MCHC 32.0 30.0 - 36.0 g/dL   RDW 15.8 (H) 11.5 - 15.5 %   Platelets 270 150 - 400 K/uL   nRBC 0.0 0.0 - 0.2 %    Comment: Performed at [facility], [address], [place], [place] [address]  Basic metabolic panel     Status: Abnormal   Collection Time: 07/19/20  5:43 AM  Result Value Ref Range   Sodium 141 135 - 145 mmol/L   Potassium 3.1 (L) 3.5 - 5.1 mmol/L   Chloride 106 98 - 111 mmol/L   CO2 28 22 - 32 mmol/L   Glucose, Bld 97 70 - 99 mg/dL    Comment: Glucose reference range applies only to samples taken after fasting for at least 8 hours.   BUN 14 8 - 23 mg/dL   Creatinine, Ser 1.49 (H) 0.44 - 1.00 mg/dL   Calcium 9.1 8.9 - 10.3 mg/dL   GFR calc non Af Amer 33 (L) >60 mL/min   GFR calc Af Amer 39 (L) >60 mL/min   Anion gap 7 5 - 15    Comment: Performed at [facility], [address] [place], [place] [address]  H. pylori antigen, stool     Status: None   Collection Time: 07/19/20  8:54 PM  Result Value Ref Range   H. Pylori Stool Ag, Eia Negative Negative    Comment: (NOTE) Performed At: [facility] [place], [place] [id number] Rush Farmer MD [id number]   CBC     Status: Abnormal   Collection Time: 07/20/20  5:58 AM  Result Value Ref Range    WBC 7.4 4.0 - 10.5 K/uL   RBC 3.49 (L) 3.87 - 5.11 MIL/uL   Hemoglobin 9.7 (L) 12.0 - 15.0 g/dL   HCT 29.0 (L) 36 - 46 %   MCV 83.1 80.0 - 100.0 fL   MCH 27.8 26.0 - 34.0 pg   MCHC 33.4 30.0 - 36.0 g/dL   RDW 15.7 (H) 11.5 - 15.5 %   Platelets 268 150 - 400  K/uL   nRBC 0.0 0.0 - 0.2 %    Comment: Performed at [facility], [address] [place], [place] [address]  Basic metabolic panel     Status: Abnormal   Collection Time: 07/20/20  5:58 AM  Result Value Ref Range   Sodium 140 135 - 145 mmol/L   Potassium 3.0 (L) 3.5 - 5.1 mmol/L   Chloride 107 98 - 111 mmol/L   CO2 25 22 - 32 mmol/L   Glucose, Bld 98 70 - 99 mg/dL    Comment: Glucose reference range applies only to samples taken after fasting for at least 8 hours.   BUN 11 8 - 23 mg/dL   Creatinine, Ser 1.24 (H) 0.44 - 1.00 mg/dL   Calcium 9.2 8.9 - 10.3 mg/dL   GFR calc non Af Amer 42 (L) >60 mL/min   GFR calc Af Amer 48 (L) >60 mL/min   Anion gap 8 5 - 15    Comment: Performed at [facility], [address] [place], [place] [address]  Iron and TIBC     Status: Abnormal   Collection Time: 07/29/20  3:19 PM  Result Value Ref Range   Total Iron Binding Capacity 361 250 - 450 ug/dL   UIBC 312 118 - 369 ug/dL   Iron 49 27 - 139 ug/dL   Iron Saturation 14 (L) 15 - 55 %  Ferritin     Status: Abnormal   Collection Time: 07/29/20  3:19 PM  Result Value Ref Range   Ferritin 12 (L) 15.0 - 150.0 ng/mL  SARS CORONAVIRUS 2 (TAT 6-24 HRS) Nasopharyngeal Nasopharyngeal Swab     Status: None   Collection Time: 08/04/20  3:17 PM   Specimen: Nasopharyngeal Swab  Result Value Ref Range   SARS Coronavirus 2 NEGATIVE NEGATIVE    Comment: (NOTE) SARS-CoV-2 target nucleic acids are NOT DETECTED.  The SARS-CoV-2 RNA is generally detectable in upper and lower respiratory specimens during the acute phase of infection. Negative results do not preclude SARS-CoV-2 infection, do not rule out co-infections with other  pathogens, and should not be used as the sole basis for treatment or other patient management decisions. Negative results must be combined with clinical observations, patient history, and epidemiological information. The expected result is Negative.  Fact Sheet for Patients: SugarRoll.be  Fact Sheet for Healthcare Providers: https://www.woods-mathews.com/  This test is not yet approved or cleared by the [place] FDA and  has been authorized for detection and/or diagnosis of SARS-CoV-2 by FDA under an Emergency Use Authorization (EUA). This EUA will remain  in effect (meaning this test can be used) for the duration of the COVID-19 declaration under Se ction 564(b)(1) of the Act, 21 U.S.C. section 360bbb-3(b)(1), unless the authorization is terminated or revoked sooner.  Performed at [facility], [place] [address] [place], [place] [address]     Assessment/Plan:  Abdominal pain Symptoms are not classic for chronic mesenteric ischemia and with isolated celiac artery stenosis that would be an atypical presentation.  She is scheduled for endoscopy both upper and lower tomorrow.  If her work-up remains unrevealing, I am certainly happy to perform angiogram for further evaluation and potential treatment.  I discussed that that may or may not improve her abdominal pain.  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  The celiac artery does have some degree of narrowing although it does not appear to be classic atherosclerotic.  It is also not a clear median arcuate ligament situation.  The superior mesenteric artery and IMA both appear to be patent without significant stenosis.  We had a long discussion today.   Her symptoms are certainly not typical of chronic mesenteric ischemia, but it is certainly possible that the celiac artery stenosis is at least a contributing factor.  If the remainder of her GI work-up remains unrevealing, I am certainly happy to perform mesenteric angiography to evaluate for treatment of her celiac artery stenosis.  We did discuss that this may not improve her symptoms.      [name] 08/05/2020, 3:08 PM   This note was created with Dragon medical transcription system.  Any errors from dictation are unintentional.

## 2020-08-05 NOTE — Assessment & Plan Note (Signed)
Symptoms are not classic for chronic mesenteric ischemia and with isolated celiac artery stenosis that would be an atypical presentation.  She is scheduled for endoscopy both upper and lower tomorrow.  If her work-up remains unrevealing, I am certainly happy to perform angiogram for further evaluation and potential treatment.  I discussed that that may or may not improve her abdominal pain.

## 2020-08-05 NOTE — Progress Notes (Signed)
Patient ID: Latoya Freeman, female   DOB: [birth year], 79 y.o.   MRN: [id number]  Chief Complaint  Patient presents with  . New Patient (Initial Visit)    ref [place] abd pain    HPI Latoya Freeman is a 79 y.o. female.  I am asked to see the patient by Dr. 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  She was actually seen by our service in consultation about a month ago when she was in the hospital with severe abdominal pain.  This is not classic chronic mesenteric ischemia although pain does sometimes worsen with eating.  No unintentional weight loss.  No food fear.  The pain is sometimes epigastric, sometimes left lower quadrant, and sometimes right lower quadrant.  She has lots of digestive issues as well.  She is actually scheduled for an upper and lower endoscopy tomorrow.  I have independently reviewed her CT scan from about 3 weeks ago.  The celiac artery does have some degree of narrowing although it does not appear to be classic atherosclerotic.  It is also not a clear median arcuate ligament situation.  The superior mesenteric artery and IMA both appear to be patent without significant stenosis.     Past Medical History:  Diagnosis Date  . Aneurysm ([place]) 1980  . Cancer ([place])   . GERD (gastroesophageal reflux disease)   . Hypertension 1980  . Hypothyroidism 1999  . Pneumonia   . Pulmonary embolism ([place])   . Shingles   . Wears dentures    full upper and lower    Past Surgical History:  Procedure Laterality Date  . BRAIN SURGERY     aneurysm  . BREAST CYST ASPIRATION Left 2005  . BREAST EXCISIONAL BIOPSY Left 2013   atypical ductal hyperplasia  . BREAST SURGERY  2013   LF Breast Wide EXC   . CHOLECYSTECTOMY  2004  . COLONOSCOPY    . COLONOSCOPY WITH PROPOFOL N/A 01/31/2018   Procedure: COLONOSCOPY WITH PROPOFOL;  Surgeon: Lollie Sails, MD;  Location: [facility] ENDOSCOPY;  Service: Endoscopy;  Laterality: N/A;  . DILATION AND CURETTAGE OF UTERUS N/A  05/03/2019   Procedure: DILATATION AND CURETTAGE;  Surgeon: Gae Dry, MD;  Location: [facility] ORS;  Service: Gynecology;  Laterality: N/A;  . ESOPHAGOGASTRODUODENOSCOPY N/A 01/31/2018   Procedure: ESOPHAGOGASTRODUODENOSCOPY (EGD);  Surgeon: Lollie Sails, MD;  Location: [facility] ENDOSCOPY;  Service: Endoscopy;  Laterality: N/A;  . SHOULDER ARTHROSCOPY Left 07/25/2018   Procedure: SHOULDER MINI OPEN ROTATOR CUFF REPAIR  BICEPS TENDOSIS ARTHROSCOPIC DISTAL CLAVICLE EXCISION  SUBACROMIAL DECOMP;  Surgeon: Leim Fabry, MD;  Location: [facility];  Service: Orthopedics;  Laterality: Left;  BEACH CHAIR WITH SPYDER SMITH & NEPHEW HEAD COIL ANCHOR FOOTPRINT ANCHOR QFIX ANCHOR     Family History  Problem Relation Age of Onset  . Breast cancer Neg Hx   No bleeding disorders, clotting disorders, aneurysms, or porphyria   Social History   Tobacco Use  . Smoking status: Former Smoker    Packs/day: 1.50    Years: 20.00    Pack years: 30.00    Quit date: 1990    Years since quitting: 31.7  . Smokeless tobacco: Never Used  Vaping Use  . Vaping Use: Never used  Substance Use Topics  . Alcohol use: No  . Drug use: No     Allergies  Allergen Reactions  . Bee Venom Swelling  . Shellfish Allergy Swelling  . Tamoxifen Hives  . Penicillins Hives, Rash  and Other (See Comments)    Has patient had a PCN reaction causing immediate rash, facial/tongue/throat swelling, SOB or lightheadedness with hypotension: Unknown Has patient had a PCN reaction causing severe rash involving mucus membranes or skin necrosis: Unknown Has patient had a PCN reaction that required hospitalization: Unknown Has patient had a PCN reaction occurring within the last 10 years: No If all of the above answers are "NO", then may proceed with Cephalosporin use.     Current Outpatient Medications  Medication Sig Dispense Refill  . amLODipine (NORVASC) 5 MG tablet Take 5 mg by mouth daily.     Marland Kitchen apixaban  (ELIQUIS) 5 MG TABS tablet Take 5 mg by mouth 2 (two) times daily.    . citalopram (CELEXA) 20 MG tablet Take 20 mg by mouth daily.    Marland Kitchen levothyroxine (SYNTHROID, LEVOTHROID) 75 MCG tablet Take 75 mcg by mouth daily.    Marland Kitchen linaclotide (LINZESS) 145 MCG CAPS capsule Take 1 capsule (145 mcg total) by mouth daily before breakfast. 30 capsule 1  . losartan (COZAAR) 50 MG tablet Take 50 mg by mouth daily.     . Na Sulfate-K Sulfate-Mg Sulf 17.5-3.13-1.6 GM/177ML SOLN At 5 PM the day before procedure take 1 bottle and 5 hours before procedure take 1 bottle. 354 mL 0  . omeprazole (PRILOSEC) 20 MG capsule Take 20 mg by mouth daily.    . potassium chloride (KLOR-CON) 10 MEQ tablet Take 10 mEq by mouth 2 (two) times daily.    Marland Kitchen triamterene-hydrochlorothiazide (MAXZIDE-25) 37.5-25 MG tablet Take 1 tablet by mouth daily.    Marland Kitchen acetaminophen (TYLENOL) 500 MG tablet Take 1 tablet by mouth every 6 (six) hours as needed. (Patient not taking: Reported on 07/29/2020)     No current facility-administered medications for this visit.      REVIEW OF SYSTEMS (Negative unless checked)  Constitutional: [] Weight loss  [] Fever  [] Chills Cardiac: [] Chest pain   [] Chest pressure   [] Palpitations   [] Shortness of breath when laying flat   [] Shortness of breath at rest   [] Shortness of breath with exertion. Vascular:  [] Pain in legs with walking   [] Pain in legs at rest   [] Pain in legs when laying flat   [] Claudication   [] Pain in feet when walking  [] Pain in feet at rest  [] Pain in feet when laying flat   [x] History of DVT   [] Phlebitis   [] Swelling in legs   [] Varicose veins   [] Non-healing ulcers Pulmonary:   [] Uses home oxygen   [] Productive cough   [] Hemoptysis   [] Wheeze  [] COPD   [] Asthma Neurologic:  [] Dizziness  [] Blackouts   [] Seizures   [] History of stroke   [] History of TIA  [] Aphasia   [] Temporary blindness   [] Dysphagia   [] Weakness or numbness in arms   [] Weakness or numbness in legs Musculoskeletal:   [x] Arthritis   [] Joint swelling   [] Joint pain   [] Low back pain Hematologic:  [] Easy bruising  [] Easy bleeding   [] Hypercoagulable state   [] Anemic  [] Hepatitis Gastrointestinal:  [] Blood in stool   [] Vomiting blood  [x] Gastroesophageal reflux/heartburn   [x] Abdominal pain Genitourinary:  [] Chronic kidney disease   [] Difficult urination  [] Frequent urination  [] Burning with urination   [] Hematuria Skin:  [] Rashes   [] Ulcers   [] Wounds Psychological:  [] History of anxiety   []  History of major depression.    Physical Exam BP 135/75 (BP Location: Left Arm)   Pulse 73   Resp 16   Ht 5' (1.524  m)   Wt 196 lb 9.6 oz (89.2 kg)   BMI 38.40 kg/m  Gen:  WD/WN, NAD Head: [place]/AT, No temporalis wasting. Ear/Nose/Throat: Hearing grossly intact, nares w/o erythema or drainage, oropharynx w/o Erythema/Exudate Eyes: Conjunctiva clear, sclera non-icteric  Neck: trachea midline.  No JVD.  Pulmonary:  Good air movement, respirations not labored, no use of accessory muscles  Cardiac: RRR, no JVD Vascular:  Vessel Right Left  Radial Palpable Palpable                                   Gastrointestinal:. No masses, surgical incisions, or scars.  Soft and nontender currently Musculoskeletal: M/S 5/5 throughout.  Extremities without ischemic changes.  No deformity or atrophy.  No edema. Neurologic: Sensation grossly intact in extremities.  Symmetrical.  Speech is fluent. Motor exam as listed above. Psychiatric: Judgment intact, Mood & affect appropriate for pt's clinical situation. Dermatologic: No rashes or ulcers noted.  No cellulitis or open wounds.    Radiology [place] MESENTERIC ARTERIES  Result Date: 07/14/2020 CLINICAL DATA:  79 year old female with pain after eating EXAM: [place] MESENTERIC ARTERIAL DOPPLER COMPARISON:  No prior duplex FINDINGS: Celiac axis: 181 cm/sec Celiac axis with inspiration: 181 cm/sec Celiac axis with expiration: 55 cm/sec Splenic artery: 104 cm/sec Hepatic artery: 122  cm/sec SMA: 109 cm/sec proximal, 409 centimeter/second mid segment. IMA: Not visualized Aorta: 46 cm/sec Aortic size: 2.2 cm proximally, 1.7 cm in the mid segment and 1.3 cm distally IMPRESSION: Directed duplex of the mesenteric arteries demonstrates no ostial stenosis of celiac artery or SMA. IMA not sampled. Elevated velocity in the mid segment of the SMA is recorded, though this may represent artifact. Signed, Dulcy Fanny. Dellia Nims, RPVI [facility] Radiology Electronically Signed   By: Corrie Mckusick D.O.   On: 07/14/2020 11:25   CT Angio Abd/Pel W and/or Wo Contrast  Result Date: 07/17/2020 CLINICAL DATA:  Abdominal pain EXAM: CTA ABDOMEN AND PELVIS WITHOUT AND WITH CONTRAST TECHNIQUE: Multidetector CT imaging of the abdomen and pelvis was performed using the standard protocol during bolus administration of intravenous contrast. Multiplanar reconstructed images and MIPs were obtained and reviewed to evaluate the vascular anatomy. CONTRAST:  70mL OMNIPAQUE IOHEXOL 350 MG/ML SOLN COMPARISON:  Ultrasound 07/14/2020.  CT 03/08/2020 FINDINGS: VASCULAR Aorta: Aortic atherosclerosis.  No aneurysm or dissection. Celiac: Mild-to-moderate stenosis just beyond the ostium, likely greater than 50%. SMA: Widely patent. Renals: Patent. Probable mild narrowing at the origin of the left renal artery. Right renal artery widely patent. IMA: Patent.  No visible stenosis. Inflow: Atherosclerosis. No aneurysm or significant stenosis. No dissection. Proximal Outflow: Atherosclerosis. No aneurysm, dissection or significant stenosis. Veins: No obvious venous abnormality within the limitations of this arterial phase study. Review of the MIP images confirms the above findings. NON-VASCULAR Lower chest: No acute abnormality. Coronary artery disease and aortic atherosclerosis. Hepatobiliary: Scattered small hypodensities in the liver compatible with cysts. Prior cholecystectomy. Pancreas:  No focal abnormality or ductal dilatation. Spleen: No focal abnormality.  Normal size. Adrenals/Urinary Tract: Bilateral renal cysts. No suspicious renal or adrenal lesion. No stones or hydronephrosis. Urinary bladder unremarkable. Stomach/Bowel: Normal appendix. Stomach, large and small bowel grossly unremarkable. Lymphatic: No adenopathy Reproductive: Small left ovarian cyst measuring 2.5 cm, stable since prior study. Uterus and right adnexa unremarkable. Other: No free fluid or free air. Musculoskeletal: No acute bony abnormality. IMPRESSION: VASCULAR Aortoiliac atherosclerosis.  No aneurysm or dissection.  Mild to moderate narrowing just beyond the origin of the celiac artery, likely 50% or greater. No other mesenteric stenosis. Suspect mild stenosis at the origin of the left renal artery. NON-VASCULAR Hepatic and renal cysts. No bowel changes of ischemia or inflammation. No acute findings. Electronically Signed   By: Rolm Baptise M.D.   On: 07/17/2020 22:21    Labs Recent Results (from the past 2160 hour(s))  Basic metabolic panel     Status: Abnormal   Collection Time: 06/08/20  2:56 PM  Result Value Ref [place]   Sodium 139 135 - 145 mmol/L   Potassium 3.5 3.5 - 5.1 mmol/L   Chloride 101 98 - 111 mmol/L   CO2 25 22 - 32 mmol/L   Glucose, Bld 75 70 - 99 mg/dL    Comment: Glucose reference range applies only to samples taken after fasting for at least 8 hours.   BUN 21 8 - 23 mg/dL   Creatinine, Ser 1.84 (H) 0.44 - 1.00 mg/dL   Calcium 10.4 (H) 8.9 - 10.3 mg/dL   GFR calc non Af Amer 26 (L) >60 mL/min   GFR calc Af Amer 30 (L) >60 mL/min   Anion gap 13 5 - 15    Comment: Performed at [facility], [address] [place], [place] [address]  CBC     Status: Abnormal   Collection Time: 06/08/20  2:56 PM  Result Value Ref [place]   WBC 8.9 4.0 - 10.5 K/uL   RBC 4.36 3.87 - 5.11 MIL/uL   Hemoglobin 11.6 (L) 12.0 - 15.0 g/dL   HCT 36.0 36 - 46 %   MCV 82.6 80.0 - 100.0 fL   MCH 26.6  26.0 - 34.0 pg   MCHC 32.2 30.0 - 36.0 g/dL   RDW 16.3 (H) 11.5 - 15.5 %   Platelets 313 150 - 400 K/uL   nRBC 0.0 0.0 - 0.2 %    Comment: Performed at [facility], [place], [place] [address]  Troponin I (High Sensitivity)     Status: None   Collection Time: 06/08/20  2:56 PM  Result Value Ref [place]   Troponin I (High Sensitivity) 6 <18 ng/L    Comment: (NOTE) Elevated high sensitivity troponin I (hsTnI) values and significant  changes across serial measurements may suggest ACS but many other  chronic and acute conditions are known to elevate hsTnI results.  Refer to the "Links" section for chest pain algorithms and additional  guidance. Performed at [facility], [place], [place] [address]   Troponin I (High Sensitivity)     Status: None   Collection Time: 06/08/20  5:28 PM  Result Value Ref [place]   Troponin I (High Sensitivity) 5 <18 ng/L    Comment: (NOTE) Elevated high sensitivity troponin I (hsTnI) values and significant  changes across serial measurements may suggest ACS but many other  chronic and acute conditions are known to elevate hsTnI results.  Refer to the "Links" section for chest pain algorithms and additional  guidance. Performed at [facility], [address] [place], [place] [address]   Hepatic function panel     Status: None   Collection Time: 06/08/20  5:28 PM  Result Value Ref [place]   Total Protein 7.7 6.5 - 8.1 g/dL   Albumin 4.0 3.5 - 5.0 g/dL   AST 19 15 - 41 U/L   ALT 18 0 - 44 U/L   Alkaline Phosphatase 51 38 -  126 U/L   Total Bilirubin 0.6 0.3 - 1.2 mg/dL   Bilirubin, Direct <0.1 0.0 - 0.2 mg/dL   Indirect Bilirubin NOT CALCULATED 0.3 - 0.9 mg/dL    Comment: Performed at [facility], [address] [place], [place] [address]  Lipase, blood     Status: None   Collection Time: 06/08/20  5:28 PM  Result Value Ref [place]   Lipase 51 11 - 51 U/L    Comment: Performed at  [facility], [address] [place], [place] [address]  Lipase, blood     Status: Abnormal   Collection Time: 07/17/20  3:03 PM  Result Value Ref [place]   Lipase 57 (H) 11 - 51 U/L    Comment: Performed at [facility], [address] [place], [place] [address]  Comprehensive metabolic panel     Status: Abnormal   Collection Time: 07/17/20  3:03 PM  Result Value Ref [place]   Sodium 138 135 - 145 mmol/L   Potassium 3.3 (L) 3.5 - 5.1 mmol/L   Chloride 101 98 - 111 mmol/L   CO2 30 22 - 32 mmol/L   Glucose, Bld 96 70 - 99 mg/dL    Comment: Glucose reference range applies only to samples taken after fasting for at least 8 hours.   BUN 18 8 - 23 mg/dL   Creatinine, Ser 1.54 (H) 0.44 - 1.00 mg/dL   Calcium 10.4 (H) 8.9 - 10.3 mg/dL   Total Protein 8.0 6.5 - 8.1 g/dL   Albumin 4.0 3.5 - 5.0 g/dL   AST 19 15 - 41 U/L   ALT 13 0 - 44 U/L   Alkaline Phosphatase 53 38 - 126 U/L   Total Bilirubin 0.7 0.3 - 1.2 mg/dL   GFR calc non Af Amer 32 (L) >60 mL/min   GFR calc Af Amer 37 (L) >60 mL/min   Anion gap 7 5 - 15    Comment: Performed at [facility], [address] [place], [place] [address]  CBC with Differential     Status: Abnormal   Collection Time: 07/17/20  3:03 PM  Result Value Ref [place]   WBC 9.0 4.0 - 10.5 K/uL   RBC 3.96 3.87 - 5.11 MIL/uL   Hemoglobin 10.9 (L) 12.0 - 15.0 g/dL   HCT 33.1 (L) 36 - 46 %   MCV 83.6 80.0 - 100.0 fL   MCH 27.5 26.0 - 34.0 pg   MCHC 32.9 30.0 - 36.0 g/dL   RDW 15.7 (H) 11.5 - 15.5 %   Platelets 347 150 - 400 K/uL   nRBC 0.0 0.0 - 0.2 %   Neutrophils Relative % 55 %   Neutro Abs 5.0 1.7 - 7.7 K/uL   Lymphocytes Relative 36 %   Lymphs Abs 3.2 0.7 - 4.0 K/uL   Monocytes Relative 7 %   Monocytes Absolute 0.6 0 - 1 K/uL   Eosinophils Relative 1 %   Eosinophils Absolute 0.1 0 - 0 K/uL   Basophils Relative 1 %   Basophils Absolute 0.1 0 - 0 K/uL   Immature Granulocytes 0 %   Abs Immature Granulocytes 0.02 0.00 -  0.07 K/uL    Comment: Performed at [facility], [address] [place], [place] [address]  Urinalysis, Complete w Microscopic     Status: Abnormal   Collection Time: 07/17/20  3:11 PM  Result Value Ref [place]   Color, Urine STRAW (A) YELLOW   APPearance CLEAR (A) CLEAR  Specific Gravity, Urine 1.005 1.005 - 1.030   pH 6.0 5.0 - 8.0   Glucose, UA NEGATIVE NEGATIVE mg/dL   Hgb urine dipstick NEGATIVE NEGATIVE   Bilirubin Urine NEGATIVE NEGATIVE   Ketones, ur NEGATIVE NEGATIVE mg/dL   Protein, ur NEGATIVE NEGATIVE mg/dL   Nitrite NEGATIVE NEGATIVE   Leukocytes,Ua TRACE (A) NEGATIVE   RBC / HPF 0-5 0 - 5 RBC/hpf   WBC, UA 6-10 0 - 5 WBC/hpf   Bacteria, UA NONE SEEN NONE SEEN   Squamous Epithelial / LPF NONE SEEN 0 - 5    Comment: Performed at [facility], [address] [place], [place] [address]  TSH     Status: Abnormal   Collection Time: 07/17/20  3:11 PM  Result Value Ref [place]   TSH 5.675 (H) 0.350 - 4.500 uIU/mL    Comment: Performed by a 3rd Generation assay with a functional sensitivity of <=0.01 uIU/mL. Performed at [facility], [address] [place], [place] [address]   Lactic acid, plasma     Status: None   Collection Time: 07/17/20  9:48 PM  Result Value Ref [place]   Lactic Acid, Venous 1.2 0.5 - 1.9 mmol/L    Comment: Performed at [facility], [address] [place], [place] [address]  SARS Coronavirus 2 by RT PCR (hospital order, performed in [facility] hospital lab) Nasopharyngeal Nasopharyngeal Swab     Status: None   Collection Time: 07/17/20 11:45 PM   Specimen: Nasopharyngeal Swab  Result Value Ref [place]   SARS Coronavirus 2 NEGATIVE NEGATIVE    Comment: (NOTE) SARS-CoV-2 target nucleic acids are NOT DETECTED.  The SARS-CoV-2 RNA is generally detectable in upper and lower respiratory specimens during the acute phase of infection. The lowest concentration of SARS-CoV-2 viral copies this assay can detect is  250 copies / mL. A negative result does not preclude SARS-CoV-2 infection and should not be used as the sole basis for treatment or other patient management decisions.  A negative result may occur with improper specimen collection / handling, submission of specimen other than nasopharyngeal swab, presence of viral mutation(s) within the areas targeted by this assay, and inadequate number of viral copies (<250 copies / mL). A negative result must be combined with clinical observations, patient history, and epidemiological information.  Fact Sheet for Patients:   StrictlyIdeas.no  Fact Sheet for Healthcare Providers: BankingDealers.co.za  This test is not yet approved or  cleared by the [place] FDA and has been authorized for detection and/or diagnosis of SARS-CoV-2 by FDA under an Emergency Use Authorization (EUA).  This EUA will remain in effect (meaning this test can be used) for the duration of the COVID-19 declaration under Section 564(b)(1) of the Act, 21 U.S.C. section 360bbb-3(b)(1), unless the authorization is terminated or revoked sooner.  Performed at [facility], [address] [place], [place] [address]   Basic metabolic panel     Status: Abnormal   Collection Time: 07/18/20  5:13 AM  Result Value Ref [place]   Sodium 140 135 - 145 mmol/L   Potassium 4.0 3.5 - 5.1 mmol/L   Chloride 104 98 - 111 mmol/L   CO2 24 22 - 32 mmol/L   Glucose, Bld 104 (H) 70 - 99 mg/dL    Comment: Glucose reference range applies only to samples taken after fasting for at least 8 hours.   BUN 17 8 - 23 mg/dL   Creatinine, Ser 1.58 (H) 0.44 - 1.00 mg/dL  Calcium 9.6 8.9 - 10.3 mg/dL   GFR calc non Af Amer 31 (L) >60 mL/min   GFR calc Af Amer 36 (L) >60 mL/min   Anion gap 12 5 - 15    Comment: Performed at [facility], [address] [place], [place] [address]  CBC     Status: Abnormal   Collection Time: 07/18/20   5:13 AM  Result Value Ref [place]   WBC 9.2 4.0 - 10.5 K/uL   RBC 3.64 (L) 3.87 - 5.11 MIL/uL   Hemoglobin 10.1 (L) 12.0 - 15.0 g/dL   HCT 31.1 (L) 36 - 46 %   MCV 85.4 80.0 - 100.0 fL   MCH 27.7 26.0 - 34.0 pg   MCHC 32.5 30.0 - 36.0 g/dL   RDW 15.7 (H) 11.5 - 15.5 %   Platelets 296 150 - 400 K/uL   nRBC 0.0 0.0 - 0.2 %    Comment: Performed at [facility], [address] [place], [place] [address]  CBC     Status: Abnormal   Collection Time: 07/19/20  5:43 AM  Result Value Ref [place]   WBC 6.4 4.0 - 10.5 K/uL   RBC 3.41 (L) 3.87 - 5.11 MIL/uL   Hemoglobin 9.3 (L) 12.0 - 15.0 g/dL   HCT 29.1 (L) 36 - 46 %   MCV 85.3 80.0 - 100.0 fL   MCH 27.3 26.0 - 34.0 pg   MCHC 32.0 30.0 - 36.0 g/dL   RDW 15.8 (H) 11.5 - 15.5 %   Platelets 270 150 - 400 K/uL   nRBC 0.0 0.0 - 0.2 %    Comment: Performed at [facility], [address] [place], [place] [address]  Basic metabolic panel     Status: Abnormal   Collection Time: 07/19/20  5:43 AM  Result Value Ref Range   Sodium 141 135 - 145 mmol/L   Potassium 3.1 (L) 3.5 - 5.1 mmol/L   Chloride 106 98 - 111 mmol/L   CO2 28 22 - 32 mmol/L   Glucose, Bld 97 70 - 99 mg/dL    Comment: Glucose reference range applies only to samples taken after fasting for at least 8 hours.   BUN 14 8 - 23 mg/dL   Creatinine, Ser 1.49 (H) 0.44 - 1.00 mg/dL   Calcium 9.1 8.9 - 10.3 mg/dL   GFR calc non Af Amer 33 (L) >60 mL/min   GFR calc Af Amer 39 (L) >60 mL/min   Anion gap 7 5 - 15    Comment: Performed at [facility], [address] [place], [place] [address]  H. pylori antigen, stool     Status: None   Collection Time: 07/19/20  8:54 PM  Result Value Ref [place]   H. Pylori Stool Ag, Eia Negative Negative    Comment: (NOTE) Performed At: [facility] [place], [place] [id number] Rush Farmer MD [id number]   CBC     Status: Abnormal   Collection Time: 07/20/20  5:58 AM  Result Value Ref Range    WBC 7.4 4.0 - 10.5 K/uL   RBC 3.49 (L) 3.87 - 5.11 MIL/uL   Hemoglobin 9.7 (L) 12.0 - 15.0 g/dL   HCT 29.0 (L) 36 - 46 %   MCV 83.1 80.0 - 100.0 fL   MCH 27.8 26.0 - 34.0 pg   MCHC 33.4 30.0 - 36.0 g/dL   RDW 15.7 (H) 11.5 - 15.5 %   Platelets 268 150 - 400  K/uL   nRBC 0.0 0.0 - 0.2 %    Comment: Performed at [facility], [address] [place], [place] [address]  Basic metabolic panel     Status: Abnormal   Collection Time: 07/20/20  5:58 AM  Result Value Ref [place]   Sodium 140 135 - 145 mmol/L   Potassium 3.0 (L) 3.5 - 5.1 mmol/L   Chloride 107 98 - 111 mmol/L   CO2 25 22 - 32 mmol/L   Glucose, Bld 98 70 - 99 mg/dL    Comment: Glucose reference range applies only to samples taken after fasting for at least 8 hours.   BUN 11 8 - 23 mg/dL   Creatinine, Ser 1.24 (H) 0.44 - 1.00 mg/dL   Calcium 9.2 8.9 - 10.3 mg/dL   GFR calc non Af Amer 42 (L) >60 mL/min   GFR calc Af Amer 48 (L) >60 mL/min   Anion gap 8 5 - 15    Comment: Performed at [facility], [address] [place], [place] [address]  Iron and TIBC     Status: Abnormal   Collection Time: 07/29/20  3:19 PM  Result Value Ref Range   Total Iron Binding Capacity 361 250 - 450 ug/dL   UIBC 312 118 - 369 ug/dL   Iron 49 27 - 139 ug/dL   Iron Saturation 14 (L) 15 - 55 %  Ferritin     Status: Abnormal   Collection Time: 07/29/20  3:19 PM  Result Value Ref Range   Ferritin 12 (L) 15.0 - 150.0 ng/mL  SARS CORONAVIRUS 2 (TAT 6-24 HRS) Nasopharyngeal Nasopharyngeal Swab     Status: None   Collection Time: 08/04/20  3:17 PM   Specimen: Nasopharyngeal Swab  Result Value Ref Range   SARS Coronavirus 2 NEGATIVE NEGATIVE    Comment: (NOTE) SARS-CoV-2 target nucleic acids are NOT DETECTED.  The SARS-CoV-2 RNA is generally detectable in upper and lower respiratory specimens during the acute phase of infection. Negative results do not preclude SARS-CoV-2 infection, do not rule out co-infections with other  pathogens, and should not be used as the sole basis for treatment or other patient management decisions. Negative results must be combined with clinical observations, patient history, and epidemiological information. The expected result is Negative.  Fact Sheet for Patients: SugarRoll.be  Fact Sheet for Healthcare Providers: https://www.woods-mathews.com/  This test is not yet approved or cleared by the [place] FDA and  has been authorized for detection and/or diagnosis of SARS-CoV-2 by FDA under an Emergency Use Authorization (EUA). This EUA will remain  in effect (meaning this test can be used) for the duration of the COVID-19 declaration under Se ction 564(b)(1) of the Act, 21 U.S.C. section 360bbb-3(b)(1), unless the authorization is terminated or revoked sooner.  Performed at [facility], [place] [address] [place], [place] [id number]     Assessment/Plan:  Abdominal pain Symptoms are not classic for chronic mesenteric ischemia and with isolated celiac artery stenosis that would be an atypical presentation.  She is scheduled for endoscopy both upper and lower tomorrow.  If her work-up remains unrevealing, I am certainly happy to perform angiogram for further evaluation and potential treatment.  I discussed that that may or may not improve her abdominal pain.  Stage 3b chronic kidney disease If she does require angiography, hydration will be of importance as well as limiting contrast  Pulmonary embolism ([place]) Remains on anticoagulation  Celiac artery stenosis ([place])  I have independently reviewed  her CT scan from about 3 weeks ago.  The celiac artery does have some degree of narrowing although it does not appear to be classic atherosclerotic.  It is also not a clear median arcuate ligament situation.  The superior mesenteric artery and IMA both appear to be patent without significant stenosis.  We had a long discussion today.   Her symptoms are certainly not typical of chronic mesenteric ischemia, but it is certainly possible that the celiac artery stenosis is at least a contributing factor.  If the remainder of her GI work-up remains unrevealing, I am certainly happy to perform mesenteric angiography to evaluate for treatment of her celiac artery stenosis.  We did discuss that this may not improve her symptoms.      [name] 08/05/2020, 3:08 PM   This note was created with Dragon medical transcription system.  Any errors from dictation are unintentional.

## 2020-08-05 NOTE — Assessment & Plan Note (Signed)
If she does require angiography, hydration will be of importance as well as limiting contrast

## 2020-08-05 NOTE — Assessment & Plan Note (Signed)
I have independently reviewed her CT scan from about 3 weeks ago.  The celiac artery does have some degree of narrowing although it does not appear to be classic atherosclerotic.  It is also not a clear median arcuate ligament situation.  The superior mesenteric artery and IMA both appear to be patent without significant stenosis.  We had a long discussion today.  Her symptoms are certainly not typical of chronic mesenteric ischemia, but it is certainly possible that the celiac artery stenosis is at least a contributing factor.  If the remainder of her GI work-up remains unrevealing, I am certainly happy to perform mesenteric angiography to evaluate for treatment of her celiac artery stenosis.  We did discuss that this may not improve her symptoms.

## 2020-08-05 NOTE — Assessment & Plan Note (Signed)
Remains on anticoagulation

## 2020-08-06 ENCOUNTER — Ambulatory Visit
Admission: RE | Admit: 2020-08-06 | Discharge: 2020-08-06 | Disposition: A | Discharge status: Home / Self Care | Payer: Medicare HMO | Source: Ambulatory Visit | Attending: Gastroenterology | Admitting: Gastroenterology

## 2020-08-06 ENCOUNTER — Encounter: Payer: Self-pay | Admitting: Gastroenterology

## 2020-08-06 ENCOUNTER — Ambulatory Visit: Payer: Medicare HMO | Admitting: Anesthesiology

## 2020-08-06 ENCOUNTER — Encounter
Admission: RE | Disposition: A | Discharge status: Home / Self Care | Payer: Self-pay | Source: Ambulatory Visit | Attending: Gastroenterology

## 2020-08-06 ENCOUNTER — Other Ambulatory Visit: Payer: Self-pay

## 2020-08-06 DIAGNOSIS — D631 Anemia in chronic kidney disease: Secondary | ICD-10-CM | POA: Diagnosis not present

## 2020-08-06 DIAGNOSIS — D120 Benign neoplasm of cecum: Secondary | ICD-10-CM | POA: Diagnosis not present

## 2020-08-06 DIAGNOSIS — K228 Other specified diseases of esophagus: Secondary | ICD-10-CM

## 2020-08-06 DIAGNOSIS — Z888 Allergy status to other drugs, medicaments and biological substances status: Secondary | ICD-10-CM | POA: Insufficient documentation

## 2020-08-06 DIAGNOSIS — R109 Unspecified abdominal pain: Secondary | ICD-10-CM | POA: Diagnosis not present

## 2020-08-06 DIAGNOSIS — I10 Essential (primary) hypertension: Secondary | ICD-10-CM | POA: Diagnosis not present

## 2020-08-06 DIAGNOSIS — D509 Iron deficiency anemia, unspecified: Secondary | ICD-10-CM

## 2020-08-06 DIAGNOSIS — E039 Hypothyroidism, unspecified: Secondary | ICD-10-CM | POA: Insufficient documentation

## 2020-08-06 DIAGNOSIS — K2950 Unspecified chronic gastritis without bleeding: Secondary | ICD-10-CM | POA: Insufficient documentation

## 2020-08-06 DIAGNOSIS — K635 Polyp of colon: Secondary | ICD-10-CM

## 2020-08-06 DIAGNOSIS — Z79899 Other long term (current) drug therapy: Secondary | ICD-10-CM | POA: Diagnosis not present

## 2020-08-06 DIAGNOSIS — K2100 Gastro-esophageal reflux disease with esophagitis, without bleeding: Secondary | ICD-10-CM | POA: Diagnosis not present

## 2020-08-06 DIAGNOSIS — D123 Benign neoplasm of transverse colon: Secondary | ICD-10-CM | POA: Diagnosis not present

## 2020-08-06 DIAGNOSIS — Z86711 Personal history of pulmonary embolism: Secondary | ICD-10-CM | POA: Diagnosis not present

## 2020-08-06 DIAGNOSIS — Z79890 Hormone replacement therapy (postmenopausal): Secondary | ICD-10-CM | POA: Insufficient documentation

## 2020-08-06 DIAGNOSIS — K644 Residual hemorrhoidal skin tags: Secondary | ICD-10-CM | POA: Diagnosis not present

## 2020-08-06 DIAGNOSIS — Z880 Allergy status to penicillin: Secondary | ICD-10-CM | POA: Insufficient documentation

## 2020-08-06 DIAGNOSIS — Z87891 Personal history of nicotine dependence: Secondary | ICD-10-CM | POA: Diagnosis not present

## 2020-08-06 DIAGNOSIS — N1832 Chronic kidney disease, stage 3b: Secondary | ICD-10-CM | POA: Diagnosis not present

## 2020-08-06 DIAGNOSIS — K219 Gastro-esophageal reflux disease without esophagitis: Secondary | ICD-10-CM | POA: Diagnosis not present

## 2020-08-06 DIAGNOSIS — K2289 Other specified disease of esophagus: Secondary | ICD-10-CM

## 2020-08-06 DIAGNOSIS — I129 Hypertensive chronic kidney disease with stage 1 through stage 4 chronic kidney disease, or unspecified chronic kidney disease: Secondary | ICD-10-CM | POA: Diagnosis not present

## 2020-08-06 DIAGNOSIS — K6389 Other specified diseases of intestine: Secondary | ICD-10-CM | POA: Diagnosis not present

## 2020-08-06 HISTORY — PX: ESOPHAGOGASTRODUODENOSCOPY (EGD) WITH PROPOFOL: SHX5813

## 2020-08-06 HISTORY — PX: COLONOSCOPY WITH PROPOFOL: SHX5780

## 2020-08-06 SURGERY — COLONOSCOPY WITH PROPOFOL
Anesthesia: General

## 2020-08-06 MED ORDER — PROPOFOL 500 MG/50ML IV EMUL
INTRAVENOUS | Status: DC | PRN
  Administered 2020-08-06: 75 ug/kg/min via INTRAVENOUS

## 2020-08-06 MED ORDER — ONDANSETRON HCL 4 MG/2ML IJ SOLN
INTRAMUSCULAR | Status: DC | PRN
  Administered 2020-08-06: 4 mg via INTRAVENOUS

## 2020-08-06 MED ORDER — FENTANYL CITRATE (PF) 100 MCG/2ML IJ SOLN
INTRAMUSCULAR | Status: AC
  Filled 2020-08-06: qty 2

## 2020-08-06 MED ORDER — SODIUM CHLORIDE 0.9 % IV SOLN: INTRAVENOUS | Status: DC

## 2020-08-06 MED ORDER — PROPOFOL 500 MG/50ML IV EMUL
INTRAVENOUS | Status: AC
  Filled 2020-08-06: qty 50

## 2020-08-06 MED ORDER — PROPOFOL 10 MG/ML IV BOLUS
INTRAVENOUS | Status: DC | PRN
  Administered 2020-08-06: 40 mg via INTRAVENOUS

## 2020-08-06 MED ORDER — MIDAZOLAM HCL 2 MG/2ML IJ SOLN
INTRAMUSCULAR | Status: AC
  Filled 2020-08-06: qty 2

## 2020-08-06 MED ORDER — FENTANYL CITRATE (PF) 100 MCG/2ML IJ SOLN
INTRAMUSCULAR | Status: DC | PRN
Start: 2020-08-06 — End: 2020-08-06
  Administered 2020-08-06 (×4): 25 ug via INTRAVENOUS

## 2020-08-06 MED ORDER — MIDAZOLAM HCL 2 MG/2ML IJ SOLN
INTRAMUSCULAR | Status: DC | PRN
  Administered 2020-08-06 (×2): 1 mg via INTRAVENOUS

## 2020-08-06 NOTE — Anesthesia Preprocedure Evaluation (Signed)
Anesthesia Evaluation  Patient identified by MRN, date of birth, ID band Patient awake    Reviewed: Allergy & Precautions, H&P , NPO status , Patient's Chart, lab work & pertinent test results, reviewed documented beta blocker date and time   Airway Mallampati: II   Neck ROM: full    Dental  (+) Poor Dentition   Pulmonary pneumonia, resolved, former smoker,    Pulmonary exam normal        Cardiovascular Exercise Tolerance: Poor hypertension, On Medications negative cardio ROS Normal cardiovascular exam Rhythm:regular Rate:Normal     Neuro/Psych negative neurological ROS  negative psych ROS   GI/Hepatic Neg liver ROS, GERD  Medicated,  Endo/Other  Hypothyroidism   Renal/GU Renal disease  negative genitourinary   Musculoskeletal   Abdominal   Peds  Hematology  (+) Blood dyscrasia, anemia ,   Anesthesia Other Findings Past Medical History: 1980: Aneurysm ([place]) No date: Cancer ([place]) No date: GERD (gastroesophageal reflux disease) 1980: Hypertension 1999: Hypothyroidism No date: Pneumonia No date: Pulmonary embolism ([facility]) No date: Shingles No date: Wears dentures     Comment:  full upper and lower Past Surgical History: No date: BRAIN SURGERY     Comment:  aneurysm 2005: BREAST CYST ASPIRATION; Left 2013: BREAST EXCISIONAL BIOPSY; Left     Comment:  atypical ductal hyperplasia 2013: BREAST SURGERY     Comment:  LF Breast Wide EXC  2004: CHOLECYSTECTOMY No date: COLONOSCOPY 01/31/2018: COLONOSCOPY WITH PROPOFOL; N/A     Comment:  Procedure: COLONOSCOPY WITH PROPOFOL;  Surgeon:               Lollie Sails, MD;  Location: [facility] ENDOSCOPY;                Service: Endoscopy;  Laterality: N/A; 05/03/2019: DILATION AND CURETTAGE OF UTERUS; N/A     Comment:  Procedure: DILATATION AND CURETTAGE;  Surgeon: Gae Dry, MD;  Location: [facility] ORS;  Service: Gynecology;               Laterality:  N/A; 01/31/2018: ESOPHAGOGASTRODUODENOSCOPY; N/A     Comment:  Procedure: ESOPHAGOGASTRODUODENOSCOPY (EGD);  Surgeon:               Lollie Sails, MD;  Location: [facility] ENDOSCOPY;                Service: Endoscopy;  Laterality: N/A; 07/25/2018: SHOULDER ARTHROSCOPY; Left     Comment:  Procedure: SHOULDER MINI OPEN ROTATOR CUFF REPAIR                BICEPS TENDOSIS ARTHROSCOPIC DISTAL CLAVICLE EXCISION                SUBACROMIAL DECOMP;  Surgeon: Leim Fabry, MD;                Location: [place];  Service: Orthopedics;                Laterality: Left;  BEACH CHAIR WITH SPYDER SMITH &               NEPHEW HEAD COIL ANCHOR FOOTPRINT ANCHOR QFIX ANCHOR BMI    Body Mass Index: 37.89 kg/m     Reproductive/Obstetrics negative OB ROS                             Anesthesia Physical Anesthesia Plan  ASA: III  Anesthesia Plan: General   Post-op Pain Management:    Induction:   PONV Risk Score and Plan:   Airway Management Planned:   Additional Equipment:   Intra-op Plan:   Post-operative Plan:   Informed Consent: I have reviewed the patients History and Physical, chart, labs and discussed the procedure including the risks, benefits and alternatives for the proposed anesthesia with the patient or authorized representative who has indicated his/her understanding and acceptance.     Dental Advisory Given  Plan Discussed with: CRNA  Anesthesia Plan Comments:         Anesthesia Quick Evaluation

## 2020-08-06 NOTE — H&P (Signed)
Vonda Antigua, MD [address] [place], [place], [place], [id number], [place], [place], [place], [address] Phone: [phone]  Fax: [phone]  Primary Care Physician:  [name], MD   Pre-Procedure History & Physical: HPI:  Latoya Freeman is a 79 y.o. female is here for a colonoscopy and EGD.   Past Medical History:  Diagnosis Date  . Aneurysm ([place]) 1980  . Cancer ([place])   . GERD (gastroesophageal reflux disease)   . Hypertension 1980  . Hypothyroidism 1999  . Pneumonia   . Pulmonary embolism ([place])   . Shingles   . Wears dentures    full upper and lower    Past Surgical History:  Procedure Laterality Date  . BRAIN SURGERY     aneurysm  . BREAST CYST ASPIRATION Left 2005  . BREAST EXCISIONAL BIOPSY Left 2013   atypical ductal hyperplasia  . BREAST SURGERY  2013   LF Breast Wide EXC   . CHOLECYSTECTOMY  2004  . COLONOSCOPY    . COLONOSCOPY WITH PROPOFOL N/A 01/31/2018   Procedure: COLONOSCOPY WITH PROPOFOL;  Surgeon: Lollie Sails, MD;  Location: [facility] ENDOSCOPY;  Service: Endoscopy;  Laterality: N/A;  . DILATION AND CURETTAGE OF UTERUS N/A 05/03/2019   Procedure: DILATATION AND CURETTAGE;  Surgeon: Gae Dry, MD;  Location: [facility] ORS;  Service: Gynecology;  Laterality: N/A;  . ESOPHAGOGASTRODUODENOSCOPY N/A 01/31/2018   Procedure: ESOPHAGOGASTRODUODENOSCOPY (EGD);  Surgeon: Lollie Sails, MD;  Location: [facility] ENDOSCOPY;  Service: Endoscopy;  Laterality: N/A;  . SHOULDER ARTHROSCOPY Left 07/25/2018   Procedure: SHOULDER MINI OPEN ROTATOR CUFF REPAIR  BICEPS TENDOSIS ARTHROSCOPIC DISTAL CLAVICLE EXCISION  SUBACROMIAL DECOMP;  Surgeon: Leim Fabry, MD;  Location: [place];  Service: Orthopedics;  Laterality: Left;  [place] & NEPHEW HEAD COIL ANCHOR FOOTPRINT ANCHOR QFIX ANCHOR    Prior to Admission medications   Medication Sig Start Date End Date Taking? Authorizing Provider  amLODipine (NORVASC)  5 MG tablet Take 5 mg by mouth daily.  02/07/13  Yes [provider]  citalopram (CELEXA) 20 MG tablet Take 20 mg by mouth daily. 04/23/19  Yes [provider]  levothyroxine (SYNTHROID, LEVOTHROID) 75 MCG tablet Take 75 mcg by mouth daily.   Yes [provider]  losartan (COZAAR) 50 MG tablet Take 50 mg by mouth daily.    Yes [provider]  omeprazole (PRILOSEC) 20 MG capsule Take 20 mg by mouth daily. 07/10/20  Yes [provider]  triamterene-hydrochlorothiazide (MAXZIDE-25) 37.5-25 MG tablet Take 1 tablet by mouth daily. 07/10/20  Yes [provider]  acetaminophen (TYLENOL) 500 MG tablet Take 1 tablet by mouth every 6 (six) hours as needed. Patient not taking: Reported on 07/29/2020    [provider]  apixaban (ELIQUIS) 5 MG TABS tablet Take 5 mg by mouth 2 (two) times daily.    [provider]  linaclotide Rolan Lipa) 145 MCG CAPS capsule Take 1 capsule (145 mcg total) by mouth daily before breakfast. 07/07/20 08/06/20  Virgel Manifold, MD  Na Sulfate-K Sulfate-Mg Sulf 17.5-3.13-1.6 GM/177ML SOLN At 5 PM the day before procedure take 1 bottle and 5 hours before procedure take 1 bottle. 07/29/20   Virgel Manifold, MD  potassium chloride (KLOR-CON) 10 MEQ tablet Take 10 mEq by mouth 2 (two) times daily. 06/17/20   [provider]    Allergies as of 07/30/2020 - Review Complete 07/29/2020  Allergen Reaction Noted  . Bee venom Swelling 01/31/2018  .  Shellfish allergy Swelling 01/31/2018  . Tamoxifen Hives 07/20/2018  . Penicillins Hives, Rash, and Other (See Comments) 12/21/2012    Family History  Problem Relation Age of Onset  . Breast cancer Neg Hx     Social History   Socioeconomic History  . Marital status: Divorced    Spouse name: Not on file  . Number of children: Not on file  . Years of education: Not on file  . Highest education level: Not on file  Occupational History  . Not on file    Tobacco Use  . Smoking status: Former Smoker    Packs/day: 1.50    Years: 20.00    Pack years: 30.00    Quit date: 1990    Years since quitting: 31.7  . Smokeless tobacco: Never Used  Vaping Use  . Vaping Use: Never used  Substance and Sexual Activity  . Alcohol use: No  . Drug use: No  . Sexual activity: Not on file  Other Topics Concern  . Not on file  Social History Narrative  . Not on file   Social Determinants of Health   Financial Resource Strain:   . Difficulty of Paying Living Expenses: Not on file  Food Insecurity:   . Worried About Charity fundraiser in the Last Year: Not on file  . Ran Out of Food in the Last Year: Not on file  Transportation Needs:   . Lack of Transportation (Medical): Not on file  . Lack of Transportation (Non-Medical): Not on file  Physical Activity:   . Days of Exercise per Week: Not on file  . Minutes of Exercise per Session: Not on file  Stress:   . Feeling of Stress : Not on file  Social Connections:   . Frequency of Communication with Friends and Family: Not on file  . Frequency of Social Gatherings with Friends and Family: Not on file  . Attends Religious Services: Not on file  . Active Member of Clubs or Organizations: Not on file  . Attends Archivist Meetings: Not on file  . Marital Status: Not on file  Intimate Partner Violence:   . Fear of Current or Ex-Partner: Not on file  . Emotionally Abused: Not on file  . Physically Abused: Not on file  . Sexually Abused: Not on file    Review of Systems: See HPI, otherwise negative ROS  Physical Exam: BP (!) 170/81   Pulse 81   Temp 98.4 F (36.9 C) (Temporal)   Resp 20   Ht 5' (1.524 m)   Wt 88 kg   SpO2 95%   BMI 37.89 kg/m  General:   Alert,  pleasant and cooperative in NAD Head:  Normocephalic and atraumatic. Neck:  Supple; no masses or thyromegaly. Lungs:  Clear throughout to auscultation, normal respiratory effort.    Heart:  +S1, +S2, Regular rate  and rhythm, No edema. Abdomen:  Soft, nontender and nondistended. Normal bowel sounds, without guarding, and without rebound.   Neurologic:  Alert and  oriented x4;  grossly normal neurologically.  Impression/Plan: Latoya Freeman is here for a colonoscopy and EGD for iron def anemia and abdominal pain  Risks, benefits, limitations, and alternatives regarding the procedures have been reviewed with the patient.  Questions have been answered.  All parties agreeable.   Virgel Manifold, MD  08/06/2020, 10:51 AM

## 2020-08-06 NOTE — OR Nursing (Signed)
Pt left her instructions on the bed under laundry.  Instructions mailed to pt.

## 2020-08-06 NOTE — Op Note (Signed)
[facility] Gastroenterology Patient Name: Latoya Freeman Procedure Date: 08/06/2020 10:47 AM MRN: [id number] Account #: [id number] Date of Birth: 1941-05-23 Admit Type: Outpatient Age: 79 Room: [facility] ENDO ROOM 3 Gender: Female Note Status: Finalized Procedure:             Colonoscopy Indications:           Iron deficiency anemia Providers:             Lavaya Defreitas B. Bonna Gains MD, MD Referring MD:          Irven Easterly. Kary Kos, MD (Referring MD) Medicines:             Monitored Anesthesia Care Complications:         No immediate complications. Procedure:             Pre-Anesthesia Assessment:                        - ASA Grade Assessment: II - A patient with mild                         systemic disease.                        - Prior to the procedure, a History and Physical was                         performed, and patient medications, allergies and                         sensitivities were reviewed. The patient's tolerance                         of previous anesthesia was reviewed.                        - The risks and benefits of the procedure and the                         sedation options and risks were discussed with the                         patient. All questions were answered and informed                         consent was obtained.                        - Patient identification and proposed procedure were                         verified prior to the procedure by the physician, the                         nurse, the anesthesiologist, the anesthetist and the                         technician. The procedure was verified in the                         procedure room.  After obtaining informed consent, the colonoscope was                         passed under direct vision. Throughout the procedure,                         the patient's blood pressure, pulse, and oxygen                         saturations were monitored continuously. The                          Colonoscope was introduced through the anus and                         advanced to the the cecum, identified by appendiceal                         orifice and ileocecal valve. The colonoscopy was                         performed with ease. The patient tolerated the                         procedure well. The quality of the bowel preparation                         was good. Findings:      The perianal and digital rectal examinations were normal.      Two flat and sessile polyps were found in the transverse colon and       cecum. The polyps were 4 to 6 mm in size. These polyps were removed with       a cold snare. Resection and retrieval were complete.      The exam was otherwise without abnormality.      The rectum, sigmoid colon, descending colon, transverse colon, ascending       colon and cecum appeared normal.      The retroflexed view of the distal rectum and anal verge was normal and       showed no anal or rectal abnormalities. Impression:            - Two 4 to 6 mm polyps in the transverse colon and in                         the cecum, removed with a cold snare. Resected and                         retrieved.                        - The examination was otherwise normal.                        - The rectum, sigmoid colon, descending colon,                         transverse colon, ascending colon and cecum are normal.                        -  The distal rectum and anal verge are normal on                         retroflexion view. Recommendation:        - Discharge patient to home (with escort).                        - Advance diet as tolerated.                        - Continue present medications.                        - Await pathology results.                        - Repeat colonoscopy date to be determined after                         pending pathology results are reviewed.                        - The findings and recommendations were  discussed with                         the patient.                        - The findings and recommendations were discussed with                         the patient's family.                        - Return to primary care physician as previously                         scheduled. Procedure Code(s):     --- Professional ---                        [phone], Colonoscopy, flexible; with removal of                         tumor(s), polyp(s), or other lesion(s) by snare                         technique Diagnosis Code(s):     --- Professional ---                        K63.5, Polyp of colon                        D50.9, Iron deficiency anemia, unspecified CPT copyright 2019 American Medical Association. All rights reserved. The codes documented in this report are preliminary and upon coder review may  be revised to meet current compliance requirements.  Vonda Antigua, MD Margretta Sidle B. Bonna Gains MD, MD 08/06/2020 11:43:25 AM This report has been signed electronically. Number of Addenda: 0 Note Initiated On: 08/06/2020 10:47 AM Scope Withdrawal Time: 0 hours 14 minutes 16 seconds  Total Procedure Duration: 0 hours 22 minutes 8 seconds  Estimated Blood Loss:  Estimated blood loss: none.      [facility]

## 2020-08-06 NOTE — Transfer of Care (Signed)
Immediate Anesthesia Transfer of Care Note  Patient: Latoya Freeman  Procedure(s) Performed: COLONOSCOPY WITH PROPOFOL (N/A ) ESOPHAGOGASTRODUODENOSCOPY (EGD) WITH PROPOFOL (N/A )  Patient Location: PACU  Anesthesia Type:General  Level of Consciousness: sedated  Airway & Oxygen Therapy: Patient Spontanous Breathing and Patient connected to face mask  Post-op Assessment: Report given to RN, Post -op Vital signs reviewed and stable and Patient moving all extremities  Post vital signs: Reviewed and stable  Last Vitals:  Vitals Value Taken Time  BP 123/68 08/06/20 1146  Temp    Pulse 64 08/06/20 1147  Resp 8 08/06/20 1147  SpO2 94 % 08/06/20 1147  Vitals shown include unvalidated device data.  Last Pain:  Vitals:   08/06/20 1003  TempSrc: Temporal  PainSc: 0-No pain         Complications: No complications documented.

## 2020-08-06 NOTE — Op Note (Signed)
[facility] Gastroenterology Patient Name: Latoya Freeman Procedure Date: 08/06/2020 10:48 AM MRN: [id number] Account #: [id number] Date of Birth: 08/28/1941 Admit Type: Outpatient Age: 79 Room: [facility] ENDO ROOM 3 Gender: Female Note Status: Finalized Procedure:             Upper GI endoscopy Indications:           Abdominal pain, Iron deficiency anemia Providers:             Calum Cormier B. Bonna Gains MD, MD Referring MD:          Irven Easterly. Kary Kos, MD (Referring MD) Medicines:             Monitored Anesthesia Care Complications:         No immediate complications. Procedure:             Pre-Anesthesia Assessment:                        - Prior to the procedure, a History and Physical was                         performed, and patient medications, allergies and                         sensitivities were reviewed. The patient's tolerance                         of previous anesthesia was reviewed.                        - The risks and benefits of the procedure and the                         sedation options and risks were discussed with the                         patient. All questions were answered and informed                         consent was obtained.                        - Patient identification and proposed procedure were                         verified prior to the procedure by the physician, the                         nurse, the anesthesiologist, the anesthetist and the                         technician. The procedure was verified in the                         procedure room.                        - ASA Grade Assessment: II - A patient with mild  systemic disease.                        After obtaining informed consent, the endoscope was                         passed under direct vision. Throughout the procedure,                         the patient's blood pressure, pulse, and oxygen                         saturations were  monitored continuously. The Endoscope                         was introduced through the mouth, and advanced to the                         third part of duodenum. The upper GI endoscopy was                         accomplished with ease. The patient tolerated the                         procedure well. Findings:      Scattered islands of salmon-colored mucosa were present. Mucosa was       biopsied with a cold forceps for histology. One specimen bottle was sent       to pathology. Mucosa was biopsied with a cold forceps for histology in a       targeted manner and in 4 quadrants.      The exam of the esophagus was otherwise normal.      The entire examined stomach was normal. Biopsies were obtained in the       gastric body, at the incisura and in the gastric antrum with cold       forceps for histology. Biopsies were taken with a cold forceps for       Helicobacter pylori testing.      The duodenal bulb, second portion of the duodenum and examined duodenum       were normal. Biopsies for histology were taken with a cold forceps for       evaluation of celiac disease. Impression:            - Salmon-colored mucosa suggestive of short-segment                         Barrett's esophagus. Biopsied.                        - Normal stomach. Biopsied.                        - Normal duodenal bulb, second portion of the duodenum                         and examined duodenum. Biopsied.                        - Biopsies were obtained in the gastric body, at the  incisura and in the gastric antrum. Recommendation:        - Await pathology results.                        - Discharge patient to home (with escort).                        - Advance diet as tolerated.                        - Continue present medications.                        - Patient has a contact number available for                         emergencies. The signs and symptoms of potential                          delayed complications were discussed with the patient.                         Return to normal activities tomorrow. Written                         discharge instructions were provided to the patient.                        - Discharge patient to home (with escort).                        - The findings and recommendations were discussed with                         the patient.                        - The findings and recommendations were discussed with                         the patient's family. Procedure Code(s):     --- Professional ---                        [phone], Esophagogastroduodenoscopy, flexible,                         transoral; with biopsy, single or multiple Diagnosis Code(s):     --- Professional ---                        K22.8, Other specified diseases of esophagus                        R10.9, Unspecified abdominal pain                        D50.9, Iron deficiency anemia, unspecified CPT copyright 2019 American Medical Association. All rights reserved. The codes documented in this report are preliminary and upon coder review may  be revised to meet current compliance requirements.  Vonda Antigua, MD Margretta Sidle B. Bonna Gains MD, MD  08/06/2020 11:15:04 AM This report has been signed electronically. Number of Addenda: 0 Note Initiated On: 08/06/2020 10:48 AM Estimated Blood Loss:  Estimated blood loss: none.      [facility]

## 2020-08-07 LAB — SURGICAL PATHOLOGY

## 2020-08-07 NOTE — Anesthesia Postprocedure Evaluation (Signed)
Anesthesia Post Note  Patient: Latoya Freeman  Procedure(s) Performed: COLONOSCOPY WITH PROPOFOL (N/A ) ESOPHAGOGASTRODUODENOSCOPY (EGD) WITH PROPOFOL (N/A )  Patient location during evaluation: Endoscopy Anesthesia Type: General Level of consciousness: awake and alert and oriented Pain management: pain level controlled Vital Signs Assessment: post-procedure vital signs reviewed and stable Respiratory status: spontaneous breathing Cardiovascular status: blood pressure returned to baseline Anesthetic complications: no   No complications documented.   Last Vitals:  Vitals:   08/06/20 1205 08/06/20 1215  BP: 133/74 133/69  Pulse: 69 63  Resp: 12 12  Temp:    SpO2: (!) 89% 92%    Last Pain:  Vitals:   08/06/20 1215  TempSrc:   PainSc: 0-No pain                 Clements Toro

## 2020-08-08 ENCOUNTER — Telehealth (INDEPENDENT_AMBULATORY_CARE_PROVIDER_SITE_OTHER): Payer: Self-pay

## 2020-08-08 NOTE — Telephone Encounter (Signed)
Spoke with the patient's daughter Adela Lank and the patient is scheduled with Dr. Lucky Cowboy for a mesenteric angio on 08/14/20 with a 9:30 am arrival time to the [facility]. Covid testing on 08/12/20 between 8-1 pm at the [facility] [place]. Pre-procedure instructions were discussed and will be mailed.

## 2020-08-12 ENCOUNTER — Other Ambulatory Visit: Payer: Self-pay

## 2020-08-12 ENCOUNTER — Other Ambulatory Visit
Admission: RE | Admit: 2020-08-12 | Discharge: 2020-08-12 | Disposition: A | Discharge status: Home / Self Care | Payer: Medicare HMO | Source: Ambulatory Visit | Attending: Vascular Surgery | Admitting: Vascular Surgery

## 2020-08-12 DIAGNOSIS — Z20822 Contact with and (suspected) exposure to covid-19: Secondary | ICD-10-CM | POA: Diagnosis not present

## 2020-08-12 DIAGNOSIS — Z01812 Encounter for preprocedural laboratory examination: Secondary | ICD-10-CM | POA: Diagnosis not present

## 2020-08-13 ENCOUNTER — Encounter: Payer: Self-pay | Admitting: Gastroenterology

## 2020-08-13 ENCOUNTER — Other Ambulatory Visit (INDEPENDENT_AMBULATORY_CARE_PROVIDER_SITE_OTHER): Payer: Self-pay | Admitting: Nurse Practitioner

## 2020-08-13 LAB — SARS CORONAVIRUS 2 (TAT 6-24 HRS): SARS Coronavirus 2: NEGATIVE

## 2020-08-14 ENCOUNTER — Encounter
Admission: RE | Disposition: A | Discharge status: Home / Self Care | Payer: Self-pay | Source: Ambulatory Visit | Attending: Vascular Surgery

## 2020-08-14 ENCOUNTER — Encounter: Payer: Self-pay | Admitting: Vascular Surgery

## 2020-08-14 ENCOUNTER — Other Ambulatory Visit: Payer: Self-pay

## 2020-08-14 ENCOUNTER — Ambulatory Visit
Admission: RE | Admit: 2020-08-14 | Discharge: 2020-08-14 | Disposition: A | Discharge status: Home / Self Care | Payer: Medicare HMO | Source: Ambulatory Visit | Attending: Vascular Surgery | Admitting: Vascular Surgery

## 2020-08-14 DIAGNOSIS — Z79890 Hormone replacement therapy (postmenopausal): Secondary | ICD-10-CM | POA: Diagnosis not present

## 2020-08-14 DIAGNOSIS — K219 Gastro-esophageal reflux disease without esophagitis: Secondary | ICD-10-CM | POA: Diagnosis not present

## 2020-08-14 DIAGNOSIS — N183 Chronic kidney disease, stage 3 unspecified: Secondary | ICD-10-CM | POA: Diagnosis not present

## 2020-08-14 DIAGNOSIS — Z8619 Personal history of other infectious and parasitic diseases: Secondary | ICD-10-CM | POA: Insufficient documentation

## 2020-08-14 DIAGNOSIS — Z87891 Personal history of nicotine dependence: Secondary | ICD-10-CM | POA: Insufficient documentation

## 2020-08-14 DIAGNOSIS — K551 Chronic vascular disorders of intestine: Secondary | ICD-10-CM | POA: Insufficient documentation

## 2020-08-14 DIAGNOSIS — Z79899 Other long term (current) drug therapy: Secondary | ICD-10-CM | POA: Diagnosis not present

## 2020-08-14 DIAGNOSIS — I129 Hypertensive chronic kidney disease with stage 1 through stage 4 chronic kidney disease, or unspecified chronic kidney disease: Secondary | ICD-10-CM | POA: Diagnosis not present

## 2020-08-14 DIAGNOSIS — I2699 Other pulmonary embolism without acute cor pulmonale: Secondary | ICD-10-CM | POA: Diagnosis not present

## 2020-08-14 DIAGNOSIS — Z7901 Long term (current) use of anticoagulants: Secondary | ICD-10-CM | POA: Insufficient documentation

## 2020-08-14 DIAGNOSIS — Z880 Allergy status to penicillin: Secondary | ICD-10-CM | POA: Insufficient documentation

## 2020-08-14 DIAGNOSIS — I774 Celiac artery compression syndrome: Secondary | ICD-10-CM | POA: Diagnosis not present

## 2020-08-14 DIAGNOSIS — K559 Vascular disorder of intestine, unspecified: Secondary | ICD-10-CM

## 2020-08-14 DIAGNOSIS — I728 Aneurysm of other specified arteries: Secondary | ICD-10-CM | POA: Diagnosis not present

## 2020-08-14 DIAGNOSIS — Z8669 Personal history of other diseases of the nervous system and sense organs: Secondary | ICD-10-CM | POA: Insufficient documentation

## 2020-08-14 DIAGNOSIS — E039 Hypothyroidism, unspecified: Secondary | ICD-10-CM | POA: Diagnosis not present

## 2020-08-14 DIAGNOSIS — K55059 Acute (reversible) ischemia of intestine, part and extent unspecified: Secondary | ICD-10-CM | POA: Diagnosis not present

## 2020-08-14 DIAGNOSIS — Z888 Allergy status to other drugs, medicaments and biological substances status: Secondary | ICD-10-CM | POA: Diagnosis not present

## 2020-08-14 HISTORY — PX: VISCERAL ANGIOGRAPHY: CATH118276

## 2020-08-14 SURGERY — VISCERAL ANGIOGRAPHY
Anesthesia: Moderate Sedation

## 2020-08-14 MED ORDER — MIDAZOLAM HCL 2 MG/ML PO SYRP: 8.0000 mg | ORAL_SOLUTION | Freq: Once | ORAL | Status: DC | PRN

## 2020-08-14 MED ORDER — METHYLPREDNISOLONE SODIUM SUCC 125 MG IJ SOLR: 125.0000 mg | Freq: Once | INTRAMUSCULAR | Status: AC | PRN

## 2020-08-14 MED ORDER — FENTANYL CITRATE (PF) 100 MCG/2ML IJ SOLN
INTRAMUSCULAR | Status: DC | PRN
Start: 2020-08-14 — End: 2020-08-14
  Administered 2020-08-14: 50 ug via INTRAVENOUS

## 2020-08-14 MED ORDER — FAMOTIDINE 20 MG PO TABS
ORAL_TABLET | ORAL | Status: AC
  Administered 2020-08-14: 40 mg via ORAL
  Filled 2020-08-14: qty 2

## 2020-08-14 MED ORDER — MIDAZOLAM HCL 5 MG/5ML IJ SOLN
INTRAMUSCULAR | Status: AC
  Filled 2020-08-14: qty 5

## 2020-08-14 MED ORDER — DIPHENHYDRAMINE HCL 50 MG/ML IJ SOLN: 50.0000 mg | Freq: Once | INTRAMUSCULAR | Status: AC | PRN

## 2020-08-14 MED ORDER — MIDAZOLAM HCL 2 MG/2ML IJ SOLN
INTRAMUSCULAR | Status: DC | PRN
  Administered 2020-08-14: 2 mg via INTRAVENOUS

## 2020-08-14 MED ORDER — HYDROMORPHONE HCL 1 MG/ML IJ SOLN: 1.0000 mg | Freq: Once | INTRAMUSCULAR | Status: DC | PRN

## 2020-08-14 MED ORDER — CLINDAMYCIN PHOSPHATE 300 MG/50ML IV SOLN
INTRAVENOUS | Status: AC
  Administered 2020-08-14: 300 mg via INTRAVENOUS
  Filled 2020-08-14: qty 50

## 2020-08-14 MED ORDER — HEPARIN SODIUM (PORCINE) 1000 UNIT/ML IJ SOLN
INTRAMUSCULAR | Status: AC
  Filled 2020-08-14: qty 1

## 2020-08-14 MED ORDER — HEPARIN SODIUM (PORCINE) 1000 UNIT/ML IJ SOLN
INTRAMUSCULAR | Status: DC | PRN
  Administered 2020-08-14: 4000 [IU] via INTRAVENOUS

## 2020-08-14 MED ORDER — ONDANSETRON HCL 4 MG/2ML IJ SOLN: 4.0000 mg | Freq: Four times a day (QID) | INTRAMUSCULAR | Status: DC | PRN

## 2020-08-14 MED ORDER — CLINDAMYCIN PHOSPHATE 300 MG/50ML IV SOLN: 300.0000 mg | Freq: Once | INTRAVENOUS | Status: AC

## 2020-08-14 MED ORDER — DIPHENHYDRAMINE HCL 50 MG/ML IJ SOLN
INTRAMUSCULAR | Status: AC
  Administered 2020-08-14: 50 mg via INTRAVENOUS
  Filled 2020-08-14: qty 1

## 2020-08-14 MED ORDER — FAMOTIDINE 20 MG PO TABS: 40.0000 mg | ORAL_TABLET | Freq: Once | ORAL | Status: AC | PRN

## 2020-08-14 MED ORDER — IODIXANOL 320 MG/ML IV SOLN
INTRAVENOUS | Status: DC | PRN
  Administered 2020-08-14: 65 mL

## 2020-08-14 MED ORDER — SODIUM CHLORIDE 0.9 % IV SOLN: INTRAVENOUS | Status: DC

## 2020-08-14 MED ORDER — METHYLPREDNISOLONE SODIUM SUCC 125 MG IJ SOLR
INTRAMUSCULAR | Status: AC
  Administered 2020-08-14: 125 mg via INTRAVENOUS
  Filled 2020-08-14: qty 2

## 2020-08-14 MED ORDER — FENTANYL CITRATE (PF) 100 MCG/2ML IJ SOLN
INTRAMUSCULAR | Status: DC
Start: 2020-08-14 — End: 2020-08-14
  Filled 2020-08-14: qty 2

## 2020-08-14 SURGICAL SUPPLY — 16 items
BALLN STERLING OTW 5X60X135 (BALLOONS) ×2
BALLOON STERLING OTW 5X60X135 (BALLOONS) ×1 IMPLANT
CATH ANGIO 5F PIGTAIL 65CM (CATHETERS) ×2 IMPLANT
CATH BEACON 5 .035 65 RIM TIP (CATHETERS) ×2 IMPLANT
CATH VS15FR (CATHETERS) ×2 IMPLANT
DEVICE STARCLOSE SE CLOSURE (Vascular Products) ×2 IMPLANT
DEVICE TORQUE .025-.038 (MISCELLANEOUS) ×2 IMPLANT
GLIDEWIRE STIFF .35X180X3 HYDR (WIRE) ×4 IMPLANT
GUIDEWIRE PFTE-COATED .018X300 (WIRE) ×2 IMPLANT
KIT ENCORE 26 ADVANTAGE (KITS) ×2 IMPLANT
PACK ANGIOGRAPHY (CUSTOM PROCEDURE TRAY) ×2 IMPLANT
SHEATH BRITE TIP 5FRX11 (SHEATH) ×2 IMPLANT
SYR MEDRAD MARK 7 150ML (SYRINGE) ×2 IMPLANT
TUBING CONTRAST HIGH PRESS 72 (TUBING) ×4 IMPLANT
WIRE J 3MM .035X145CM (WIRE) ×2 IMPLANT
WIRE MAGIC TOR.035 180C (WIRE) ×2 IMPLANT

## 2020-08-14 NOTE — Discharge Instructions (Signed)

## 2020-08-14 NOTE — Op Note (Signed)
[facility]  Percutaneous Study/Intervention Procedural Note   Date: 08/14/2020  Surgeon(s): [name], MD  Assistants: none  Pre-operative Diagnosis: 1.  Chronic mesenteric ischemia 2.  Celiac stenosis   Post-operative diagnosis:  Same although the celiac stenosis appeared to be from extrinsic compression  Procedure(s) Performed:             1.  Ultrasound guidance for vascular access right femoral artery             2.  Catheter placement into SMA and celiac artery from right femoral approach             3.  Aortogram and selective angiogram of the SMA and celiac artery             4.  PTA of the celiac artery with 5 mm diameter angioplasty balloon             5.  StarClose closure device right femoral artery  Contrast: 65  Fluoro time: 9.4 minutes  EBL: 5 cc  Anesthesia: Approximately 45 minutes of Moderate conscious sedation using 2 mg of Versed and 50 mcg of Fentanyl              Indications:  Patient is a 79 y.o. female who has symptoms consistent with mesenteric ischemia. The patient has a CT scan showing celiac artery stenosis. The patient is brought in for angiography for further evaluation and potential treatment. Risks and benefits are discussed and informed consent is obtained  Procedure:  The patient was identified and appropriate procedural time out was performed.  The patient was then placed supine on the table and prepped and draped in the usual sterile fashion. Moderate conscious sedation was administered during a face to face encounter with the patient throughout the procedure with my supervision of the RN administering medicines and monitoring the patient's vital signs, pulse oximetry, telemetry and mental status throughout from the start of the procedure until the patient was taken to the recovery room.  Ultrasound was used to evaluate the right common femoral artery.  It was patent .  A digital ultrasound image was acquired.  A Seldinger  needle was used to access the right common femoral artery under direct ultrasound guidance and a permanent image was performed.  A 0.035 J wire was advanced without resistance and a 5Fr sheath was placed.  Pigtail catheter was placed into the aorta and an AP aortogram was performed. This demonstrated normal renal arteries, normal aorta and iliac arteries without significant stenosis although some mild iliac disease was seen. We transitioned to the lateral projection to image the celiac and SMA. The lateral image demonstrated what appeared to be SMA with brisk flow and stenosis of the celiac artery.  The patient was given 4000 units of IV heparin. We upsized to a 6 Fr sheath.  A V S1 catheter was used to selectively cannulate the SMA.  Selective imaging of the SMA showed this to be widely patent with no atherosclerosis or narrowing in all.  I then used a V S1 catheter to selectively cannulate the celiac artery.  This demonstrated a stenosis about 1-1/2 to 2 cm beyond the origin prior to the trifurcation of the celiac artery that had the appearance of extrinsic compression from above.  This was creating a greater than 80% stenosis.  But did not appear to be a lot of atherosclerosis, but I felt it would be reasonable to cross the lesion potentially balloon and see  if this had any improvement that would indicate native disease and not extrinsic compression.  I used a 0.018 advantage wire and parked this well out to the celiac branches and remove the diagnostic catheter.  A 5 mm diameter angioplasty balloon was then inflated in the proximal celiac artery.  This immediately inflated without much waist and was taken to 10 atm for 1 minute.  Completion imaging showed no improvement at all with angioplasty with an identical picture consistent with extrinsic compression.  I did not feel placing a stent in this location was in her best interest, and would consider referral for median arcuate ligament decompression at this  point.  I elected to terminate the procedure. The diagnostic catheter was removed. StarClose closure device was deployed in usual fashion with excellent hemostatic result. The patient was taken to the recovery room in stable condition having tolerated the procedure well.     Findings: Extrinsic compression creating greater than 80% stenosis of the celiac artery.  SMA widely patent.  Disposition: Patient was taken to the recovery room in stable condition having tolerated the procedure well.  Complications:  None  [name] 08/14/2020 2:24 PM   This note was created with Dragon Medical transcription system. Any errors in dictation are purely unintentional.

## 2020-08-14 NOTE — Interval H&P Note (Signed)
History and Physical Interval Note:  08/14/2020 10:35 AM  Latoya Freeman  has presented today for surgery, with the diagnosis of Mesenteric Angio   Mesenteric ischemia Covid  Sept 21.  The various methods of treatment have been discussed with the patient and family. After consideration of risks, benefits and other options for treatment, the patient has consented to  Procedure(s): VISCERAL ANGIOGRAPHY (N/A) as a surgical intervention.  The patient's history has been reviewed, patient examined, no change in status, stable for surgery.  I have reviewed the patient's chart and labs.  Questions were answered to the patient's satisfaction.     [name]

## 2020-08-15 ENCOUNTER — Inpatient Hospital Stay: Payer: Medicare HMO | Admitting: Oncology

## 2020-08-20 ENCOUNTER — Telehealth (INDEPENDENT_AMBULATORY_CARE_PROVIDER_SITE_OTHER): Payer: Self-pay

## 2020-08-20 NOTE — Telephone Encounter (Signed)
I   would discuss with Dr. Delana Meyer, as there is not a note to indicate what was discussed

## 2020-08-20 NOTE — Telephone Encounter (Signed)
Pt called and left a VM on the nurses line wanting to know about a referral to a provider in [place] per Dr. Delana Meyer . Please advise.

## 2020-08-21 ENCOUNTER — Other Ambulatory Visit: Payer: Self-pay

## 2020-08-21 DIAGNOSIS — I2782 Chronic pulmonary embolism: Secondary | ICD-10-CM

## 2020-08-21 NOTE — Telephone Encounter (Signed)
I spoke with Dr. Delana Meyer and he said that she is a pt of [facility] I will have to ask Dr. Lucky Cowboy tomorrow 10/1.

## 2020-08-22 ENCOUNTER — Encounter: Payer: Self-pay | Admitting: Oncology

## 2020-08-22 ENCOUNTER — Other Ambulatory Visit: Payer: Self-pay

## 2020-08-22 ENCOUNTER — Inpatient Hospital Stay: Payer: Medicare HMO | Attending: Oncology | Admitting: Oncology

## 2020-08-22 ENCOUNTER — Inpatient Hospital Stay: Payer: Medicare HMO

## 2020-08-22 VITALS — BP 112/56 | HR 78 | Temp 98.7°F | Resp 18 | Ht 60.0 in | Wt 192.6 lb

## 2020-08-22 DIAGNOSIS — D509 Iron deficiency anemia, unspecified: Secondary | ICD-10-CM | POA: Diagnosis not present

## 2020-08-22 DIAGNOSIS — Z7901 Long term (current) use of anticoagulants: Secondary | ICD-10-CM | POA: Insufficient documentation

## 2020-08-22 DIAGNOSIS — E039 Hypothyroidism, unspecified: Secondary | ICD-10-CM | POA: Insufficient documentation

## 2020-08-22 DIAGNOSIS — I2782 Chronic pulmonary embolism: Secondary | ICD-10-CM | POA: Diagnosis not present

## 2020-08-22 DIAGNOSIS — Z87892 Personal history of anaphylaxis: Secondary | ICD-10-CM | POA: Insufficient documentation

## 2020-08-22 LAB — RETICULOCYTES
Immature Retic Fract: 17.7 % — ABNORMAL HIGH (ref 2.3–15.9)
RBC.: 4.33 MIL/uL (ref 3.87–5.11)
Retic Count, Absolute: 78.8 10*3/uL (ref 19.0–186.0)
Retic Ct Pct: 1.8 % (ref 0.4–3.1)

## 2020-08-22 LAB — TSH: TSH: 2.192 u[IU]/mL (ref 0.350–4.500)

## 2020-08-22 LAB — CBC WITH DIFFERENTIAL/PLATELET
Abs Immature Granulocytes: 0.08 10*3/uL — ABNORMAL HIGH (ref 0.00–0.07)
Basophils Absolute: 0.1 10*3/uL (ref 0.0–0.1)
Basophils Relative: 1 %
Eosinophils Absolute: 0 10*3/uL (ref 0.0–0.5)
Eosinophils Relative: 1 %
HCT: 34.9 % — ABNORMAL LOW (ref 36.0–46.0)
Hemoglobin: 11.9 g/dL — ABNORMAL LOW (ref 12.0–15.0)
Immature Granulocytes: 1 %
Lymphocytes Relative: 29 %
Lymphs Abs: 2.5 10*3/uL (ref 0.7–4.0)
MCH: 27.5 pg (ref 26.0–34.0)
MCHC: 34.1 g/dL (ref 30.0–36.0)
MCV: 80.6 fL (ref 80.0–100.0)
Monocytes Absolute: 0.9 10*3/uL (ref 0.1–1.0)
Monocytes Relative: 10 %
Neutro Abs: 5.1 10*3/uL (ref 1.7–7.7)
Neutrophils Relative %: 58 %
Platelets: 298 10*3/uL (ref 150–400)
RBC: 4.33 MIL/uL (ref 3.87–5.11)
RDW: 14.4 % (ref 11.5–15.5)
WBC: 8.6 10*3/uL (ref 4.0–10.5)
nRBC: 0 % (ref 0.0–0.2)

## 2020-08-22 LAB — VITAMIN B12: Vitamin B-12: 571 pg/mL (ref 180–914)

## 2020-08-22 LAB — COMPREHENSIVE METABOLIC PANEL
ALT: 19 U/L (ref 0–44)
AST: 20 U/L (ref 15–41)
Albumin: 4.1 g/dL (ref 3.5–5.0)
Alkaline Phosphatase: 52 U/L (ref 38–126)
Anion gap: 12 (ref 5–15)
BUN: 17 mg/dL (ref 8–23)
CO2: 25 mmol/L (ref 22–32)
Calcium: 10.2 mg/dL (ref 8.9–10.3)
Chloride: 101 mmol/L (ref 98–111)
Creatinine, Ser: 1.5 mg/dL — ABNORMAL HIGH (ref 0.44–1.00)
GFR calc Af Amer: 38 mL/min — ABNORMAL LOW (ref >60)
GFR calc non Af Amer: 33 mL/min — ABNORMAL LOW (ref >60)
Glucose, Bld: 103 mg/dL — ABNORMAL HIGH (ref 70–99)
Potassium: 3.4 mmol/L — ABNORMAL LOW (ref 3.5–5.1)
Sodium: 138 mmol/L (ref 135–145)
Total Bilirubin: 0.6 mg/dL (ref 0.3–1.2)
Total Protein: 7.9 g/dL (ref 6.5–8.1)

## 2020-08-22 LAB — FERRITIN: Ferritin: 11 ng/mL (ref 11–307)

## 2020-08-22 LAB — IRON AND TIBC
Iron: 129 ug/dL (ref 28–170)
Saturation Ratios: 31 % (ref 10.4–31.8)
TIBC: 414 ug/dL (ref 250–450)
UIBC: 285 ug/dL

## 2020-08-22 LAB — FOLATE: Folate: 17.7 ng/mL (ref >5.9)

## 2020-08-22 NOTE — Progress Notes (Signed)
The patient c/o upper abdomen pain ( pain level 8)

## 2020-08-23 LAB — HAPTOGLOBIN: Haptoglobin: 225 mg/dL (ref 42–346)

## 2020-08-25 ENCOUNTER — Telehealth: Payer: Medicare HMO | Admitting: Gastroenterology

## 2020-08-25 DIAGNOSIS — I774 Celiac artery compression syndrome: Secondary | ICD-10-CM | POA: Diagnosis not present

## 2020-08-25 LAB — MULTIPLE MYELOMA PANEL, SERUM
Albumin SerPl Elph-Mcnc: 3.8 g/dL (ref 2.9–4.4)
Albumin/Glob SerPl: 1.1 (ref 0.7–1.7)
Alpha 1: 0.2 g/dL (ref 0.0–0.4)
Alpha2 Glob SerPl Elph-Mcnc: 0.9 g/dL (ref 0.4–1.0)
B-Globulin SerPl Elph-Mcnc: 1.3 g/dL (ref 0.7–1.3)
Gamma Glob SerPl Elph-Mcnc: 1.3 g/dL (ref 0.4–1.8)
Globulin, Total: 3.7 g/dL (ref 2.2–3.9)
IgA: 443 mg/dL — ABNORMAL HIGH (ref 64–422)
IgG (Immunoglobin G), Serum: 1198 mg/dL (ref 586–1602)
IgM (Immunoglobulin M), Srm: 104 mg/dL (ref 26–217)
Total Protein ELP: 7.5 g/dL (ref 6.0–8.5)

## 2020-08-26 ENCOUNTER — Inpatient Hospital Stay (HOSPITAL_BASED_OUTPATIENT_CLINIC_OR_DEPARTMENT_OTHER): Payer: Medicare HMO | Admitting: Oncology

## 2020-08-26 DIAGNOSIS — Z7901 Long term (current) use of anticoagulants: Secondary | ICD-10-CM

## 2020-08-26 DIAGNOSIS — D509 Iron deficiency anemia, unspecified: Secondary | ICD-10-CM

## 2020-08-26 DIAGNOSIS — I2690 Septic pulmonary embolism without acute cor pulmonale: Secondary | ICD-10-CM

## 2020-08-26 DIAGNOSIS — I2782 Chronic pulmonary embolism: Secondary | ICD-10-CM | POA: Diagnosis not present

## 2020-08-26 MED ORDER — APIXABAN 5 MG PO TABS
5.0000 mg | ORAL_TABLET | Freq: Two times a day (BID) | ORAL | 3 refills | Status: DC
Start: 2020-08-26 — End: 2020-12-03

## 2020-08-27 DIAGNOSIS — I129 Hypertensive chronic kidney disease with stage 1 through stage 4 chronic kidney disease, or unspecified chronic kidney disease: Secondary | ICD-10-CM | POA: Diagnosis not present

## 2020-08-27 DIAGNOSIS — D631 Anemia in chronic kidney disease: Secondary | ICD-10-CM | POA: Diagnosis not present

## 2020-08-27 DIAGNOSIS — E039 Hypothyroidism, unspecified: Secondary | ICD-10-CM | POA: Diagnosis not present

## 2020-08-27 DIAGNOSIS — I10 Essential (primary) hypertension: Secondary | ICD-10-CM | POA: Diagnosis not present

## 2020-08-27 DIAGNOSIS — Z01818 Encounter for other preprocedural examination: Secondary | ICD-10-CM | POA: Diagnosis not present

## 2020-08-27 DIAGNOSIS — I2699 Other pulmonary embolism without acute cor pulmonale: Secondary | ICD-10-CM | POA: Diagnosis not present

## 2020-08-27 DIAGNOSIS — N2581 Secondary hyperparathyroidism of renal origin: Secondary | ICD-10-CM | POA: Diagnosis not present

## 2020-08-27 DIAGNOSIS — E876 Hypokalemia: Secondary | ICD-10-CM | POA: Diagnosis not present

## 2020-08-27 DIAGNOSIS — N183 Chronic kidney disease, stage 3 unspecified: Secondary | ICD-10-CM | POA: Diagnosis not present

## 2020-08-27 NOTE — Progress Notes (Signed)
Hematology/Oncology Consult note [facility]  [phone] Fax:([phone]  Patient Care Team: [name], MD as PCP - General (Family Medicine) Sindy Guadeloupe, MD as Consulting Physician (Oncology)   Name of the patient: Latoya Freeman  [id number]  Jan 08, 1941   Date of visit: 08/27/20  Diagnosis-history of unprovoked pulmonary embolism on Eliquis   Chief complaint/ Reason for visit-patient will be referred for anemia  Heme/Onc history: Patient is a 79 year old female who was seen by me in June 2020.  She was found to have unprovoked PE in December 2019 and has been on Eliquis since then.  She has now been referred to me for anemia.  Most recent CBC from 07/20/2020 showed H&H of 9.7/29 with an MCV of 83 ferritin levels were low at 12 and iron saturation low at 14%.  Patient has been following up with Dr. 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  She is going to be evaluated by vascular surgery at [facility] for possible intervention of this ligament  Interval history-patient continues to have on and off abdominal pain especially on eating.  She feels what she eats pretty much comes out through her bowel movements.  Reports ongoing weight loss  ECOG PS- 1 Pain scale- 5   Review of systems- Review of Systems  Constitutional: Positive for malaise/fatigue. Negative for chills, fever and weight loss.  HENT: Negative for congestion, ear discharge and nosebleeds.   Eyes: Negative for blurred vision.  Respiratory: Negative for cough, hemoptysis, sputum production, shortness of breath and wheezing.   Cardiovascular: Negative for chest pain, palpitations, orthopnea and claudication.    Gastrointestinal: Positive for abdominal pain. Negative for blood in stool, constipation, diarrhea, heartburn, melena, nausea and vomiting.  Genitourinary: Negative for dysuria, flank pain, frequency, hematuria and urgency.  Musculoskeletal: Negative for back pain, joint pain and myalgias.  Skin: Negative for rash.  Neurological: Negative for dizziness, tingling, focal weakness, seizures, weakness and headaches.  Endo/Heme/Allergies: Does not bruise/bleed easily.  Psychiatric/Behavioral: Negative for depression and suicidal ideas. The patient does not have insomnia.       Allergies  Allergen Reactions  . Bee Venom Swelling  . Shellfish Allergy Swelling  . Tamoxifen Hives  . Penicillins Hives, Rash and Other (See Comments)    Has patient had a PCN reaction causing immediate rash, facial/tongue/throat swelling, SOB or lightheadedness with hypotension: Unknown Has patient had a PCN reaction causing severe rash involving mucus membranes or skin necrosis: Unknown Has patient had a PCN reaction that required hospitalization: Unknown Has patient had a PCN reaction occurring within the last 10 years: No If all of the above answers are "NO", then may proceed with Cephalosporin use.      Past Medical History:  Diagnosis Date  . Aneurysm ([place]) 1980  . Cancer ([place])   . GERD (gastroesophageal reflux disease)   . Hypertension 1980  . Hypothyroidism 1999  . Pneumonia   . Pulmonary embolism ([place])   . Shingles   . Wears dentures    full upper and lower     Past Surgical History:  Procedure Laterality Date  . BRAIN SURGERY     aneurysm  . BREAST CYST ASPIRATION Left 2005  . BREAST EXCISIONAL BIOPSY Left 2013   atypical ductal hyperplasia  .  BREAST SURGERY  2013   LF Breast Wide EXC   . CHOLECYSTECTOMY  2004  . COLONOSCOPY    . COLONOSCOPY WITH PROPOFOL N/A 01/31/2018   Procedure: COLONOSCOPY WITH PROPOFOL;  Surgeon: Lollie Sails, MD;  Location[facility] ENDOSCOPY;  Service:  Endoscopy;  Laterality: N/A;  . COLONOSCOPY WITH PROPOFOL N/A 08/06/2020   Procedure: COLONOSCOPY WITH PROPOFOL;  Surgeon: Virgel Manifold, MD;  Location: [facility] ENDOSCOPY;  Service: Endoscopy;  Laterality: N/A;  . DILATION AND CURETTAGE OF UTERUS N/A 05/03/2019   Procedure: DILATATION AND CURETTAGE;  Surgeon: Gae Dry, MD;  Location: [facility] ORS;  Service: Gynecology;  Laterality: N/A;  . ESOPHAGOGASTRODUODENOSCOPY N/A 01/31/2018   Procedure: ESOPHAGOGASTRODUODENOSCOPY (EGD);  Surgeon: Lollie Sails, MD;  Location: [facility] ENDOSCOPY;  Service: Endoscopy;  Laterality: N/A;  . ESOPHAGOGASTRODUODENOSCOPY (EGD) WITH PROPOFOL N/A 08/06/2020   Procedure: ESOPHAGOGASTRODUODENOSCOPY (EGD) WITH PROPOFOL;  Surgeon: Virgel Manifold, MD;  Location: [facility] ENDOSCOPY;  Service: Endoscopy;  Laterality: N/A;  . SHOULDER ARTHROSCOPY Left 07/25/2018   Procedure: SHOULDER MINI OPEN ROTATOR CUFF REPAIR  BICEPS TENDOSIS ARTHROSCOPIC DISTAL CLAVICLE EXCISION  SUBACROMIAL DECOMP;  Surgeon: Leim Fabry, MD;  Location: [facility];  Service: Orthopedics;  Laterality: Left;  Astronomer WITH SPYDER SMITH & NEPHEW HEAD COIL ANCHOR FOOTPRINT ANCHOR QFIX ANCHOR  . VISCERAL ANGIOGRAPHY N/A 08/14/2020   Procedure: VISCERAL ANGIOGRAPHY;  Surgeon: Algernon Huxley, MD;  Location: [place] [facility];  Service: Cardiovascular;  Laterality: N/A;    Social History   Socioeconomic History  . Marital status: Divorced    Spouse name: Not on file  . Number of children: Not on file  . Years of education: Not on file  . Highest education level: Not on file  Occupational History  . Not on file  Tobacco Use  . Smoking status: Former Smoker    Packs/day: 1.50    Years: 20.00    Pack years: 30.00    Quit date: 1990    Years since quitting: 31.7  . Smokeless tobacco: Never Used  Vaping Use  . Vaping Use: Never used  Substance and Sexual Activity  . Alcohol use: No  . Drug use: No  . Sexual activity: Not on  file  Other Topics Concern  . Not on file  Social History Narrative  . Not on file   Social Determinants of Health   Financial Resource Strain:   . Difficulty of Paying Living Expenses: Not on file  Food Insecurity:   . Worried About Charity fundraiser in the Last Year: Not on file  . Ran Out of Food in the Last Year: Not on file  Transportation Needs:   . Lack of Transportation (Medical): Not on file  . Lack of Transportation (Non-Medical): Not on file  Physical Activity:   . Days of Exercise per Week: Not on file  . Minutes of Exercise per Session: Not on file  Stress:   . Feeling of Stress : Not on file  Social Connections:   . Frequency of Communication with Friends and Family: Not on file  . Frequency of Social Gatherings with Friends and Family: Not on file  . Attends Religious Services: Not on file  . Active Member of Clubs or Organizations: Not on file  . Attends Archivist Meetings: Not on file  . Marital Status: Not on file  Intimate Partner Violence:   . Fear of Current or Ex-Partner: Not on file  . Emotionally Abused: Not on file  .  Physically Abused: Not on file  . Sexually Abused: Not on file    Family History  Problem Relation Age of Onset  . Breast cancer Neg Hx      Current Outpatient Medications:  .  acetaminophen (TYLENOL) 500 MG tablet, Take 1 tablet by mouth every 6 (six) hours as needed. , Disp: , Rfl:  .  amLODipine (NORVASC) 5 MG tablet, Take 5 mg by mouth daily. , Disp: , Rfl:  .  citalopram (CELEXA) 20 MG tablet, Take 20 mg by mouth daily., Disp: , Rfl:  .  levothyroxine (SYNTHROID, LEVOTHROID) 75 MCG tablet, Take 75 mcg by mouth daily., Disp: , Rfl:  .  losartan (COZAAR) 50 MG tablet, Take 50 mg by mouth daily. , Disp: , Rfl:  .  omeprazole (PRILOSEC) 20 MG capsule, Take 20 mg by mouth daily., Disp: , Rfl:  .  potassium chloride (KLOR-CON) 10 MEQ tablet, Take 10 mEq by mouth 2 (two) times daily., Disp: , Rfl:  .   triamterene-hydrochlorothiazide (MAXZIDE-25) 37.5-25 MG tablet, Take 1 tablet by mouth daily., Disp: , Rfl:  .  apixaban (ELIQUIS) 5 MG TABS tablet, Take 1 tablet (5 mg total) by mouth 2 (two) times daily., Disp: 60 tablet, Rfl: 3 .  linaclotide (LINZESS) 145 MCG CAPS capsule, Take 1 capsule (145 mcg total) by mouth daily before breakfast. (Patient not taking: Reported on 08/22/2020), Disp: 30 capsule, Rfl: 1 .  Na Sulfate-K Sulfate-Mg Sulf 17.5-3.13-1.6 GM/177ML SOLN, At 5 PM the day before procedure take 1 bottle and 5 hours before procedure take 1 bottle. (Patient not taking: Reported on 08/14/2020), Disp: 354 mL, Rfl: 0  Physical exam:  Vitals:   08/22/20 1129  BP: (!) 112/56  Pulse: 78  Resp: 18  Temp: 98.7 F (37.1 C)  TempSrc: Tympanic  SpO2: 97%  Weight: 192 lb 9.6 oz (87.4 kg)  Height: 5' (1.524 m)   Physical Exam Constitutional:      General: She is not in acute distress. Cardiovascular:     Rate and Rhythm: Normal rate and regular rhythm.     Heart sounds: Normal heart sounds.  Pulmonary:     Effort: Pulmonary effort is normal.     Breath sounds: Normal breath sounds.  Abdominal:     General: Bowel sounds are normal.     Palpations: Abdomen is soft.  Skin:    General: Skin is warm and dry.  Neurological:     Mental Status: She is alert and oriented to person, place, and time.      CMP Latest Ref Rng & Units 08/22/2020  Glucose 70 - 99 mg/dL 103(H)  BUN 8 - 23 mg/dL 17  Creatinine 0.44 - 1.00 mg/dL 1.50(H)  Sodium 135 - 145 mmol/L 138  Potassium 3.5 - 5.1 mmol/L 3.4(L)  Chloride 98 - 111 mmol/L 101  CO2 22 - 32 mmol/L 25  Calcium 8.9 - 10.3 mg/dL 10.2  Total Protein 6.5 - 8.1 g/dL 7.9  Total Bilirubin 0.3 - 1.2 mg/dL 0.6  Alkaline Phos 38 - 126 U/L 52  AST 15 - 41 U/L 20  ALT 0 - 44 U/L 19   CBC Latest Ref Rng & Units 08/22/2020  WBC 4.0 - 10.5 K/uL 8.6  Hemoglobin 12.0 - 15.0 g/dL 11.9(L)  Hematocrit 36 - 46 % 34.9(L)  Platelets 150 - 400 K/uL 298     No images are attached to the encounter.  PERIPHERAL VASCULAR CATHETERIZATION  Result Date: 08/14/2020 See op note  Assessment and plan- Patient is a 79 y.o. female with prior history of unprovoked PE on Eliquis now referred for anemia  Patient presently denies any symptoms ofGI bleeding denies any dark melanotic stools.  She recently underwent EGD and colonoscopy with Dr. Bonna Gains.  She was found to have polyps in the transverse colon and cecum but no evidence of bleeding at this time I would recommend that patient can continue Eliquis.  I will be doing a complete anemia work-up today including a CBC with differential, ferritin and iron studies, B12 and folate, TSH, haptoglobin, multiple myeloma panel video visit with me in 1 week's time  Visit Diagnosis 1. Other chronic pulmonary embolism without acute cor pulmonale (HCC)   2. Iron deficiency anemia, unspecified iron deficiency anemia type      Dr. Randa Evens, MD, MPH [facility] at [facility] [phone] 08/27/2020 9:12 PM

## 2020-08-28 ENCOUNTER — Telehealth: Payer: Medicare HMO | Admitting: Oncology

## 2020-08-29 NOTE — Progress Notes (Signed)
I connected with Latoya Freeman on 08/29/20 at  2:30 PM EDT by video enabled telemedicine visit and verified that I am speaking with the correct person using two identifiers.   I discussed the limitations, risks, security and privacy concerns of performing an evaluation and management service by telemedicine and the availability of in-person appointments. I also discussed with the patient that there may be a patient responsible charge related to this service. The patient expressed understanding and agreed to proceed.  Other persons participating in the visit and their role in the encounter:  none  Patient's location:  home Provider's location:  work  Risk analyst Complaint: Discuss results of blood work  History of present illness: Patient is a 79 year old female who was seen by me in June 2020.  She was found to have unprovoked PE in December 2019 and has been on Eliquis since then.  She has now been referred to me for anemia.  Most recent CBC from 07/20/2020 showed H&H of 9.7/29 with an MCV of 83 ferritin levels were low at 12 and iron saturation low at 14%.  Patient has been following up with Dr. Lucky Cowboy for evaluation of celiac artery stenosis for symptoms of chronic mesenteric ischemia she underwent aortogram and selective angiogram of SMA and celiac artery which showed extrinsic compression creating more than 80% stenosis of the celiac artery possibly secondary to internal arcuate ligament.  She is going to be evaluated by vascular surgery at [facility] for possible intervention of this ligament  Results of blood work from 08/22/2020 showed H&H of 11.9/34.9 with a normal white count and platelet count.  B12 and folate were normal.  Ferritin level was low at 11.  Iron studies were normal.  TSH, haptoglobin, myeloma panel were unremarkable.  Interval history : Patient reports fatigue and ongoing abdominal pain as well asdiarrhea   Review of Systems  Constitutional: Positive for malaise/fatigue. Negative  for chills, fever and weight loss.  HENT: Negative for congestion, ear discharge and nosebleeds.   Eyes: Negative for blurred vision.  Respiratory: Negative for cough, hemoptysis, sputum production, shortness of breath and wheezing.   Cardiovascular: Negative for chest pain, palpitations, orthopnea and claudication.  Gastrointestinal: Positive for abdominal pain and diarrhea. Negative for blood in stool, constipation, heartburn, melena, nausea and vomiting.  Genitourinary: Negative for dysuria, flank pain, frequency, hematuria and urgency.  Musculoskeletal: Negative for back pain, joint pain and myalgias.  Skin: Negative for rash.  Neurological: Negative for dizziness, tingling, focal weakness, seizures, weakness and headaches.  Endo/Heme/Allergies: Does not bruise/bleed easily.  Psychiatric/Behavioral: Negative for depression and suicidal ideas. The patient does not have insomnia.     Allergies  Allergen Reactions  . Bee Venom Swelling  . Shellfish Allergy Swelling  . Tamoxifen Hives  . Penicillins Hives, Rash and Other (See Comments)    Has patient had a PCN reaction causing immediate rash, facial/tongue/throat swelling, SOB or lightheadedness with hypotension: Unknown Has patient had a PCN reaction causing severe rash involving mucus membranes or skin necrosis: Unknown Has patient had a PCN reaction that required hospitalization: Unknown Has patient had a PCN reaction occurring within the last 10 years: No If all of the above answers are "NO", then may proceed with Cephalosporin use.     Past Medical History:  Diagnosis Date  . Aneurysm ([place]) 1980  . Cancer ([place])   . GERD (gastroesophageal reflux disease)   . Hypertension 1980  . Hypothyroidism 1999  . Pneumonia   . Pulmonary embolism ([place])   .  Shingles   . Wears dentures    full upper and lower    Past Surgical History:  Procedure Laterality Date  . BRAIN SURGERY     aneurysm  . BREAST CYST ASPIRATION Left 2005  .  BREAST EXCISIONAL BIOPSY Left 2013   atypical ductal hyperplasia  . BREAST SURGERY  2013   LF Breast Wide EXC   . CHOLECYSTECTOMY  2004  . COLONOSCOPY    . COLONOSCOPY WITH PROPOFOL N/A 01/31/2018   Procedure: COLONOSCOPY WITH PROPOFOL;  Surgeon: Lollie Sails, MD;  Location: [facility] ENDOSCOPY;  Service: Endoscopy;  Laterality: N/A;  . COLONOSCOPY WITH PROPOFOL N/A 08/06/2020   Procedure: COLONOSCOPY WITH PROPOFOL;  Surgeon: Virgel Manifold, MD;  Location: [facility] ENDOSCOPY;  Service: Endoscopy;  Laterality: N/A;  . DILATION AND CURETTAGE OF UTERUS N/A 05/03/2019   Procedure: DILATATION AND CURETTAGE;  Surgeon: Gae Dry, MD;  Location: [facility] ORS;  Service: Gynecology;  Laterality: N/A;  . ESOPHAGOGASTRODUODENOSCOPY N/A 01/31/2018   Procedure: ESOPHAGOGASTRODUODENOSCOPY (EGD);  Surgeon: Lollie Sails, MD;  Location: [facility] ENDOSCOPY;  Service: Endoscopy;  Laterality: N/A;  . ESOPHAGOGASTRODUODENOSCOPY (EGD) WITH PROPOFOL N/A 08/06/2020   Procedure: ESOPHAGOGASTRODUODENOSCOPY (EGD) WITH PROPOFOL;  Surgeon: Virgel Manifold, MD;  Location: [facility] ENDOSCOPY;  Service: Endoscopy;  Laterality: N/A;  . SHOULDER ARTHROSCOPY Left 07/25/2018   Procedure: SHOULDER MINI OPEN ROTATOR CUFF REPAIR  BICEPS TENDOSIS ARTHROSCOPIC DISTAL CLAVICLE EXCISION  SUBACROMIAL DECOMP;  Surgeon: Leim Fabry, MD;  Location: [place];  Service: Orthopedics;  Laterality: Left;  Astronomer WITH SPYDER SMITH & NEPHEW HEAD COIL ANCHOR FOOTPRINT ANCHOR QFIX ANCHOR  . VISCERAL ANGIOGRAPHY N/A 08/14/2020   Procedure: VISCERAL ANGIOGRAPHY;  Surgeon: Algernon Huxley, MD;  Location: [facility];  Service: Cardiovascular;  Laterality: N/A;    Social History   Socioeconomic History  . Marital status: Divorced    Spouse name: Not on file  . Number of children: Not on file  . Years of education: Not on file  . Highest education level: Not on file  Occupational History  . Not on file  Tobacco Use  .  Smoking status: Former Smoker    Packs/day: 1.50    Years: 20.00    Pack years: 30.00    Quit date: 1990    Years since quitting: 31.7  . Smokeless tobacco: Never Used  Vaping Use  . Vaping Use: Never used  Substance and Sexual Activity  . Alcohol use: No  . Drug use: No  . Sexual activity: Not on file  Other Topics Concern  . Not on file  Social History Narrative  . Not on file   Social Determinants of Health   Financial Resource Strain:   . Difficulty of Paying Living Expenses: Not on file  Food Insecurity:   . Worried About Charity fundraiser in the Last Year: Not on file  . Ran Out of Food in the Last Year: Not on file  Transportation Needs:   . Lack of Transportation (Medical): Not on file  . Lack of Transportation (Non-Medical): Not on file  Physical Activity:   . Days of Exercise per Week: Not on file  . Minutes of Exercise per Session: Not on file  Stress:   . Feeling of Stress : Not on file  Social Connections:   . Frequency of Communication with Friends and Family: Not on file  . Frequency of Social Gatherings with Friends and Family: Not on file  . Attends Religious Services:  Not on file  . Active Member of Clubs or Organizations: Not on file  . Attends Archivist Meetings: Not on file  . Marital Status: Not on file  Intimate Partner Violence:   . Fear of Current or Ex-Partner: Not on file  . Emotionally Abused: Not on file  . Physically Abused: Not on file  . Sexually Abused: Not on file    Family History  Problem Relation Age of Onset  . Breast cancer Neg Hx      Current Outpatient Medications:  .  acetaminophen (TYLENOL) 500 MG tablet, Take 1 tablet by mouth every 6 (six) hours as needed. , Disp: , Rfl:  .  amLODipine (NORVASC) 5 MG tablet, Take 5 mg by mouth daily. , Disp: , Rfl:  .  apixaban (ELIQUIS) 5 MG TABS tablet, Take 1 tablet (5 mg total) by mouth 2 (two) times daily., Disp: 60 tablet, Rfl: 3 .  citalopram (CELEXA) 20 MG  tablet, Take 20 mg by mouth daily., Disp: , Rfl:  .  levothyroxine (SYNTHROID, LEVOTHROID) 75 MCG tablet, Take 75 mcg by mouth daily., Disp: , Rfl:  .  linaclotide (LINZESS) 145 MCG CAPS capsule, Take 1 capsule (145 mcg total) by mouth daily before breakfast. (Patient not taking: Reported on 08/22/2020), Disp: 30 capsule, Rfl: 1 .  losartan (COZAAR) 50 MG tablet, Take 50 mg by mouth daily. , Disp: , Rfl:  .  Na Sulfate-K Sulfate-Mg Sulf 17.5-3.13-1.6 GM/177ML SOLN, At 5 PM the day before procedure take 1 bottle and 5 hours before procedure take 1 bottle. (Patient not taking: Reported on 08/14/2020), Disp: 354 mL, Rfl: 0 .  omeprazole (PRILOSEC) 20 MG capsule, Take 20 mg by mouth daily., Disp: , Rfl:  .  potassium chloride (KLOR-CON) 10 MEQ tablet, Take 10 mEq by mouth 2 (two) times daily., Disp: , Rfl:  .  triamterene-hydrochlorothiazide (MAXZIDE-25) 37.5-25 MG tablet, Take 1 tablet by mouth daily., Disp: , Rfl:   PERIPHERAL VASCULAR CATHETERIZATION  Result Date: 08/14/2020 See op note   No images are attached to the encounter.   CMP Latest Ref Rng & Units 08/22/2020  Glucose 70 - 99 mg/dL 103(H)  BUN 8 - 23 mg/dL 17  Creatinine 0.44 - 1.00 mg/dL 1.50(H)  Sodium 135 - 145 mmol/L 138  Potassium 3.5 - 5.1 mmol/L 3.4(L)  Chloride 98 - 111 mmol/L 101  CO2 22 - 32 mmol/L 25  Calcium 8.9 - 10.3 mg/dL 10.2  Total Protein 6.5 - 8.1 g/dL 7.9  Total Bilirubin 0.3 - 1.2 mg/dL 0.6  Alkaline Phos 38 - 126 U/L 52  AST 15 - 41 U/L 20  ALT 0 - 44 U/L 19   CBC Latest Ref Rng & Units 08/22/2020  WBC 4.0 - 10.5 K/uL 8.6  Hemoglobin 12.0 - 15.0 g/dL 11.9(L)  Hematocrit 36 - 46 % 34.9(L)  Platelets 150 - 400 K/uL 298     Observation/objective: Appears in no acute distress over video visit today.  Breathing is nonlabored  Assessment and plan: Patient is a 79 year old female with prior history of unprovoked PE on Eliquis now referred for anemia  Anemia: Results are blood work consistent with iron  deficiency.  She has possible upcoming surgery for internal arcuate ligament repair and she would like to proceed with IV iron.  Insurance is okay with Feraheme and I will therefore give her 2 doses of Feraheme 510 mg IV weekly. repeat CBC ferritin and iron studies in 2 months and I will see her  thereafter  With regards to her PE: She recently underwent EGD and colonoscopy with Dr. Bonna Gains which was unremarkable and did not show any bleeding.  I have asked her to keep taking her Eliquis for now.  She can hold her Eliquis 3 days prior to any invasive procedures  Follow-up instructions: As above  I discussed the assessment and treatment plan with the patient. The patient was provided an opportunity to ask questions and all were answered. The patient agreed with the plan and demonstrated an understanding of the instructions.   The patient was advised to call back or seek an in-person evaluation if the symptoms worsen or if the condition fails to improve as anticipated.    Visit Diagnosis: 1. Iron deficiency anemia, unspecified iron deficiency anemia type   2. Current use of long term anticoagulation   3. Chronic septic pulmonary embolism without acute cor pulmonale (HCC)     Dr. Randa Evens, MD, MPH [place] [facility] Tel- [phone] 08/29/2020 1:00 PM

## 2020-09-01 ENCOUNTER — Other Ambulatory Visit: Payer: Self-pay

## 2020-09-01 ENCOUNTER — Inpatient Hospital Stay: Payer: Medicare HMO

## 2020-09-01 VITALS — BP 104/68 | HR 80 | Temp 98.4°F

## 2020-09-01 DIAGNOSIS — Z01818 Encounter for other preprocedural examination: Secondary | ICD-10-CM | POA: Diagnosis not present

## 2020-09-01 DIAGNOSIS — E039 Hypothyroidism, unspecified: Secondary | ICD-10-CM | POA: Diagnosis not present

## 2020-09-01 DIAGNOSIS — D649 Anemia, unspecified: Secondary | ICD-10-CM | POA: Diagnosis not present

## 2020-09-01 DIAGNOSIS — E669 Obesity, unspecified: Secondary | ICD-10-CM | POA: Diagnosis not present

## 2020-09-01 DIAGNOSIS — Z86711 Personal history of pulmonary embolism: Secondary | ICD-10-CM | POA: Diagnosis not present

## 2020-09-01 DIAGNOSIS — K219 Gastro-esophageal reflux disease without esophagitis: Secondary | ICD-10-CM | POA: Diagnosis not present

## 2020-09-01 DIAGNOSIS — I10 Essential (primary) hypertension: Secondary | ICD-10-CM | POA: Diagnosis not present

## 2020-09-01 DIAGNOSIS — D508 Other iron deficiency anemias: Secondary | ICD-10-CM

## 2020-09-01 DIAGNOSIS — I2782 Chronic pulmonary embolism: Secondary | ICD-10-CM | POA: Diagnosis not present

## 2020-09-01 DIAGNOSIS — D509 Iron deficiency anemia, unspecified: Secondary | ICD-10-CM | POA: Diagnosis not present

## 2020-09-01 DIAGNOSIS — Z7901 Long term (current) use of anticoagulants: Secondary | ICD-10-CM | POA: Diagnosis not present

## 2020-09-01 DIAGNOSIS — N1832 Chronic kidney disease, stage 3b: Secondary | ICD-10-CM | POA: Diagnosis not present

## 2020-09-01 DIAGNOSIS — I774 Celiac artery compression syndrome: Secondary | ICD-10-CM | POA: Diagnosis not present

## 2020-09-01 DIAGNOSIS — Z87892 Personal history of anaphylaxis: Secondary | ICD-10-CM | POA: Diagnosis not present

## 2020-09-01 DIAGNOSIS — E079 Disorder of thyroid, unspecified: Secondary | ICD-10-CM | POA: Diagnosis not present

## 2020-09-01 MED ORDER — SODIUM CHLORIDE 0.9 % IV SOLN
Freq: Once | INTRAVENOUS | Status: AC
  Filled 2020-09-01: qty 250

## 2020-09-01 MED ORDER — SODIUM CHLORIDE 0.9 % IV SOLN
510.0000 mg | Freq: Once | INTRAVENOUS | Status: AC
  Administered 2020-09-01: 510 mg via INTRAVENOUS
  Filled 2020-09-01: qty 510

## 2020-09-03 ENCOUNTER — Other Ambulatory Visit (HOSPITAL_COMMUNITY): Payer: Self-pay | Admitting: Internal Medicine

## 2020-09-03 ENCOUNTER — Other Ambulatory Visit: Payer: Self-pay | Admitting: Internal Medicine

## 2020-09-03 DIAGNOSIS — I2699 Other pulmonary embolism without acute cor pulmonale: Secondary | ICD-10-CM

## 2020-09-03 DIAGNOSIS — Z01818 Encounter for other preprocedural examination: Secondary | ICD-10-CM

## 2020-09-04 ENCOUNTER — Other Ambulatory Visit: Payer: Self-pay | Admitting: Internal Medicine

## 2020-09-04 DIAGNOSIS — I2699 Other pulmonary embolism without acute cor pulmonale: Secondary | ICD-10-CM

## 2020-09-05 ENCOUNTER — Ambulatory Visit
Admission: RE | Admit: 2020-09-05 | Discharge: 2020-09-05 | Disposition: A | Discharge status: Home / Self Care | Payer: Medicare HMO | Source: Ambulatory Visit | Attending: Internal Medicine | Admitting: Internal Medicine

## 2020-09-05 ENCOUNTER — Other Ambulatory Visit: Payer: Self-pay

## 2020-09-05 DIAGNOSIS — I2699 Other pulmonary embolism without acute cor pulmonale: Secondary | ICD-10-CM

## 2020-09-05 DIAGNOSIS — I2609 Other pulmonary embolism with acute cor pulmonale: Secondary | ICD-10-CM | POA: Diagnosis not present

## 2020-09-05 DIAGNOSIS — I10 Essential (primary) hypertension: Secondary | ICD-10-CM | POA: Insufficient documentation

## 2020-09-05 LAB — ECHOCARDIOGRAM COMPLETE
AR max vel: 1.85 cm2
AV Area VTI: 1.73 cm2
AV Area mean vel: 1.74 cm2
AV Mean grad: 4 mmHg
AV Peak grad: 7.6 mmHg
Ao pk vel: 1.38 m/s
Area-P 1/2: 2.42 cm2
S' Lateral: 2.83 cm

## 2020-09-05 NOTE — Progress Notes (Signed)
*  PRELIMINARY RESULTS* Echocardiogram 2D Echocardiogram has been performed.  Latoya Freeman 09/05/2020, 11:51 AM

## 2020-09-08 ENCOUNTER — Inpatient Hospital Stay: Payer: Medicare HMO

## 2020-09-08 ENCOUNTER — Other Ambulatory Visit: Payer: Self-pay

## 2020-09-08 VITALS — BP 129/86 | HR 60 | Temp 99.0°F | Resp 18

## 2020-09-08 DIAGNOSIS — Z87892 Personal history of anaphylaxis: Secondary | ICD-10-CM | POA: Diagnosis not present

## 2020-09-08 DIAGNOSIS — D508 Other iron deficiency anemias: Secondary | ICD-10-CM

## 2020-09-08 DIAGNOSIS — I2782 Chronic pulmonary embolism: Secondary | ICD-10-CM | POA: Diagnosis not present

## 2020-09-08 DIAGNOSIS — Z7901 Long term (current) use of anticoagulants: Secondary | ICD-10-CM | POA: Diagnosis not present

## 2020-09-08 DIAGNOSIS — D509 Iron deficiency anemia, unspecified: Secondary | ICD-10-CM | POA: Diagnosis not present

## 2020-09-08 DIAGNOSIS — E039 Hypothyroidism, unspecified: Secondary | ICD-10-CM | POA: Diagnosis not present

## 2020-09-08 MED ORDER — SODIUM CHLORIDE 0.9 % IV SOLN
Freq: Once | INTRAVENOUS | Status: AC
  Filled 2020-09-08: qty 250

## 2020-09-08 MED ORDER — SODIUM CHLORIDE 0.9 % IV SOLN
510.0000 mg | Freq: Once | INTRAVENOUS | Status: AC
  Administered 2020-09-08: 510 mg via INTRAVENOUS
  Filled 2020-09-08: qty 510

## 2020-09-08 NOTE — Progress Notes (Signed)
Patient tolerated Feraheme infusion well. Vitals stable. All questions answered.

## 2020-09-10 ENCOUNTER — Ambulatory Visit
Admission: RE | Admit: 2020-09-10 | Discharge: 2020-09-10 | Disposition: A | Discharge status: Home / Self Care | Payer: Medicare HMO | Source: Ambulatory Visit | Attending: Internal Medicine | Admitting: Internal Medicine

## 2020-09-10 ENCOUNTER — Other Ambulatory Visit: Payer: Self-pay

## 2020-09-10 DIAGNOSIS — I2699 Other pulmonary embolism without acute cor pulmonale: Secondary | ICD-10-CM | POA: Diagnosis not present

## 2020-09-10 DIAGNOSIS — I2609 Other pulmonary embolism with acute cor pulmonale: Secondary | ICD-10-CM | POA: Diagnosis not present

## 2020-09-10 DIAGNOSIS — Z01818 Encounter for other preprocedural examination: Secondary | ICD-10-CM | POA: Diagnosis not present

## 2020-09-10 DIAGNOSIS — Z01810 Encounter for preprocedural cardiovascular examination: Secondary | ICD-10-CM | POA: Diagnosis not present

## 2020-09-10 LAB — NM MYOCAR MULTI W/SPECT W/WALL MOTION / EF
Estimated workload: 1 METS
Exercise duration (min): 1 min
Exercise duration (sec): 10 s
LV dias vol: 45 mL (ref 46–106)
LV sys vol: 11 mL
MPHR: 142 {beats}/min
Peak HR: 84 {beats}/min
Percent HR: 59 %
Rest HR: 59 {beats}/min
SDS: 5
SRS: 0
SSS: 6
TID: 0.96

## 2020-09-10 MED ORDER — TECHNETIUM TC 99M TETROFOSMIN IV KIT
10.0000 | PACK | Freq: Once | INTRAVENOUS | Status: AC | PRN
  Administered 2020-09-10: 10.261 via INTRAVENOUS

## 2020-09-10 MED ORDER — TECHNETIUM TC 99M TETROFOSMIN IV KIT
29.4840 | PACK | Freq: Once | INTRAVENOUS | Status: AC | PRN
  Administered 2020-09-10: 29.484 via INTRAVENOUS

## 2020-09-10 MED ORDER — REGADENOSON 0.4 MG/5ML IV SOLN
0.4000 mg | Freq: Once | INTRAVENOUS | Status: AC
  Administered 2020-09-10: 0.4 mg via INTRAVENOUS

## 2020-09-22 ENCOUNTER — Telehealth (INDEPENDENT_AMBULATORY_CARE_PROVIDER_SITE_OTHER): Payer: Medicare HMO | Admitting: Gastroenterology

## 2020-09-22 ENCOUNTER — Encounter: Payer: Self-pay | Admitting: Gastroenterology

## 2020-09-22 ENCOUNTER — Other Ambulatory Visit: Payer: Self-pay

## 2020-09-22 DIAGNOSIS — R109 Unspecified abdominal pain: Secondary | ICD-10-CM

## 2020-09-22 DIAGNOSIS — D509 Iron deficiency anemia, unspecified: Secondary | ICD-10-CM

## 2020-09-22 NOTE — Progress Notes (Signed)
Vonda Antigua, MD [address]  [place]  [place], [place] [address]  Main: [phone]  Fax: [phone]   Primary Care Physician: [name], MD  Virtual Visit via Telephone Note  I connected with patient on 09/22/20 at  9:50 AM EDT by telephone and verified that I am speaking with the correct person using two identifiers.   I discussed the limitations, risks, security and privacy concerns of performing an evaluation and management service by telephone and the availability of in person appointments. I also discussed with the patient that there may be a patient responsible charge related to this service. The patient expressed understanding and agreed to proceed.  Location of Patient: Home Location of Provider: Home Persons involved: Patient and provider only during the visit (nursing staff and front desk staff was involved in communicating with the patient prior to the appointment, reviewing medications and checking them in)   History of Present Illness: Chief Complaint  Patient presents with  . Anemia    Patient continues to get blood infusions.  . Abdominal Pain    Patient continues to have abdominal pain.      HPI: JOELIE SCHOU is a 79 y.o. female here for follow-up abdominal pain.  Patient was referred to Dr. Lucky Cowboy after last visit and was found to have celiac artery stenosis from extrinsic compression.  As per patient she has been referred to another physician in [place] for treatment of this and this has been approved by her insurance and that is what she is waiting on now.  She is also receiving iron supplementation by hematology with improvement in hemoglobin.  Denies any blood in stool, nausea or vomiting, weight loss, dysphagia, or loss of appetite.  Current Outpatient Medications  Medication Sig Dispense Refill  . acetaminophen (TYLENOL) 500 MG tablet Take 1 tablet by mouth every 6 (six) hours as needed.     Marland Kitchen amLODipine (NORVASC) 5 MG tablet Take  5 mg by mouth daily.     Marland Kitchen apixaban (ELIQUIS) 5 MG TABS tablet Take 1 tablet (5 mg total) by mouth 2 (two) times daily. 60 tablet 3  . citalopram (CELEXA) 20 MG tablet Take 20 mg by mouth daily.    Marland Kitchen levothyroxine (SYNTHROID, LEVOTHROID) 75 MCG tablet Take 75 mcg by mouth daily.    Marland Kitchen losartan (COZAAR) 50 MG tablet Take 50 mg by mouth daily.     . Na Sulfate-K Sulfate-Mg Sulf 17.5-3.13-1.6 GM/177ML SOLN At 5 PM the day before procedure take 1 bottle and 5 hours before procedure take 1 bottle. 354 mL 0  . omeprazole (PRILOSEC) 20 MG capsule Take 20 mg by mouth daily.    . potassium chloride (KLOR-CON) 10 MEQ tablet Take 10 mEq by mouth 2 (two) times daily.    Marland Kitchen triamterene-hydrochlorothiazide (MAXZIDE-25) 37.5-25 MG tablet Take 1 tablet by mouth daily.    Marland Kitchen linaclotide (LINZESS) 145 MCG CAPS capsule Take 1 capsule (145 mcg total) by mouth daily before breakfast. (Patient not taking: Reported on 09/22/2020) 30 capsule 1   No current facility-administered medications for this visit.    Allergies as of 09/22/2020 - Review Complete 09/22/2020  Allergen Reaction Noted  . Bee venom Swelling 01/31/2018  . Shellfish allergy Swelling 01/31/2018  . Tamoxifen Hives 07/20/2018  . Penicillins Hives, Rash, and Other (See Comments) 12/21/2012    Review of Systems:    All systems reviewed and negative except where noted in HPI.   Observations/Objective:  Labs: CMP  Component Value Date/Time   NA 138 08/22/2020 1210   K 3.4 (L) 08/22/2020 1210   K 3.0 (L) 02/14/2014 1447   CL 101 08/22/2020 1210   CO2 25 08/22/2020 1210   GLUCOSE 103 (H) 08/22/2020 1210   BUN 17 08/22/2020 1210   CREATININE 1.50 (H) 08/22/2020 1210   CALCIUM 10.2 08/22/2020 1210   PROT 7.9 08/22/2020 1210   ALBUMIN 4.1 08/22/2020 1210   AST 20 08/22/2020 1210   ALT 19 08/22/2020 1210   ALKPHOS 52 08/22/2020 1210   BILITOT 0.6 08/22/2020 1210   GFRNONAA 33 (L) 08/22/2020 1210   GFRAA 38 (L) 08/22/2020 1210   Lab  Results  Component Value Date   WBC 8.6 08/22/2020   HGB 11.9 (L) 08/22/2020   HCT 34.9 (L) 08/22/2020   MCV 80.6 08/22/2020   PLT 298 08/22/2020    Imaging Studies: NM Myocar Multi W/Spect W/Wall Motion / EF  Result Date: 09/10/2020  Blood pressure demonstrated a normal response to exercise.  There was no ST segment deviation noted during stress.  The study is normal.  This is a low risk study.  The left ventricular ejection fraction is hyperdynamic (>65%).    ECHOCARDIOGRAM COMPLETE  Result Date: 09/05/2020    ECHOCARDIOGRAM REPORT   Patient Name:   FEROL LAICHE Date of Exam: 09/05/2020 Medical Rec #:  [id number]       Height:       60.0 in Accession #:    [id number]      Weight:       192.6 lb Date of Birth:  1941/06/30        BSA:          1.837 m Patient Age:    28 years        BP:           104/68 mmHg Patient Gender: F               HR:           65 bpm. Exam Location:  [facility] Procedure: 2D Echo, Color Doppler and Cardiac Doppler Indications:     I26.99 Pulmonary Embolus  History:         Patient has no prior history of Echocardiogram examinations.                  Risk Factors:Hypertension.  Sonographer:     Charmayne Sheer RDCS (AE) Referring Phys:  [id number] [facility] D CALLWOOD Diagnosing Phys: Bartholome Bill MD IMPRESSIONS  1. Left ventricular ejection fraction, by estimation, is 60 to 65%. The left ventricle has normal function. The left ventricle has no regional wall motion abnormalities. There is mild left ventricular hypertrophy. Left ventricular diastolic parameters are consistent with Grade I diastolic dysfunction (impaired relaxation).  2. Right ventricular systolic function is normal. The right ventricular size is mildly enlarged.  3. The mitral valve is grossly normal. Trivial mitral valve regurgitation.  4. The aortic valve is grossly normal. Aortic valve regurgitation is not visualized. FINDINGS  Left Ventricle: Left ventricular ejection fraction, by estimation, is 60 to 65%. The left  ventricle has normal function. The left ventricle has no regional wall motion abnormalities. The left ventricular internal cavity size was normal in size. There is  mild left ventricular hypertrophy. Left ventricular diastolic parameters are consistent with Grade I diastolic dysfunction (impaired relaxation). Right Ventricle: The right ventricular size is mildly enlarged. No increase in right ventricular wall thickness. Right ventricular systolic function is  normal. Left Atrium: Left atrial size was normal in size. Right Atrium: Right atrial size was normal in size. Pericardium: There is no evidence of pericardial effusion. Mitral Valve: The mitral valve is grossly normal. Trivial mitral valve regurgitation. MV peak gradient, 3.3 mmHg. The mean mitral valve gradient is 1.0 mmHg. Tricuspid Valve: The tricuspid valve is grossly normal. Tricuspid valve regurgitation is trivial. Aortic Valve: The aortic valve is grossly normal. Aortic valve regurgitation is not visualized. Aortic valve mean gradient measures 4.0 mmHg. Aortic valve peak gradient measures 7.6 mmHg. Aortic valve area, by VTI measures 1.73 cm. Pulmonic Valve: The pulmonic valve was not well visualized. Pulmonic valve regurgitation is trivial. Aorta: The aortic root is normal in size and structure. IAS/Shunts: The interatrial septum was not assessed.  LEFT VENTRICLE PLAX 2D LVIDd:         4.50 cm  Diastology LVIDs:         2.83 cm  LV e' medial:    7.83 cm/s LV PW:         0.89 cm  LV E/e' medial:  6.4 LV IVS:        0.87 cm  LV e' lateral:   8.59 cm/s LVOT diam:     2.00 cm  LV E/e' lateral: 5.9 LV SV:         39 LV SV Index:   21 LVOT Area:     3.14 cm  RIGHT VENTRICLE RV Basal diam:  2.40 cm LEFT ATRIUM             Index       RIGHT ATRIUM           Index LA diam:        3.10 cm 1.69 cm/m  RA Area:     11.90 cm LA Vol (A2C):   23.3 ml 12.68 ml/m RA Volume:   26.40 ml  14.37 ml/m LA Vol (A4C):   21.5 ml 11.70 ml/m LA Biplane Vol: 22.9 ml 12.47 ml/m   AORTIC VALVE                   PULMONIC VALVE AV Area (Vmax):    1.85 cm    PV Vmax:       0.99 m/s AV Area (Vmean):   1.74 cm    PV Vmean:      67.600 cm/s AV Area (VTI):     1.73 cm    PV VTI:        0.175 m AV Vmax:           138.00 cm/s PV Peak grad:  3.9 mmHg AV Vmean:          93.900 cm/s PV Mean grad:  2.0 mmHg AV VTI:            0.227 m AV Peak Grad:      7.6 mmHg AV Mean Grad:      4.0 mmHg LVOT Vmax:         81.30 cm/s LVOT Vmean:        51.900 cm/s LVOT VTI:          0.125 m LVOT/AV VTI ratio: 0.55  AORTA Ao Root diam: 3.00 cm MITRAL VALVE MV Area (PHT): 2.42 cm    SHUNTS MV Peak grad:  3.3 mmHg    Systemic VTI:  0.12 m MV Mean grad:  1.0 mmHg    Systemic Diam: 2.00 cm MV Vmax:       0.90 m/s  MV Vmean:      43.5 cm/s MV Decel Time: 314 msec MV E velocity: 50.40 cm/s MV A velocity: 90.20 cm/s MV E/A ratio:  0.56 Bartholome Bill MD Electronically signed by Bartholome Bill MD Signature Date/Time: 09/05/2020/1:02:50 PM    Final     Assessment and Plan:   HAELYN FORGEY is a 79 y.o. y/o female here for follow-up abdominal pain  Assessment and Plan: Continue work-up with vascular surgery for extrinsic compression of celiac artery noted on angiography  Continue iron replacement by hematology  Patient will need small bowel capsule study in the future, however, will let her complete her vascular work-up and treatment prior to this.  This is not urgent at this time given improvement in hemoglobin with iron replacement, and no signs of active bleeding at this time  Continue close follow-up with PCP, hematology and vascular surgery Follow-up with [place] in a few months  Follow Up Instructions:    I discussed the assessment and treatment plan with the patient. The patient was provided an opportunity to ask questions and all were answered. The patient agreed with the plan and demonstrated an understanding of the instructions.   The patient was advised to call back or seek an in-person evaluation if  the symptoms worsen or if the condition fails to improve as anticipated.  I provided 15 minutes of non-face-to-face time during this encounter. Additional time was spent in reviewing patient's chart, placing orders etc.   Virgel Manifold, MD  Speech recognition software was used to dictate this note.

## 2020-09-25 DIAGNOSIS — I774 Celiac artery compression syndrome: Secondary | ICD-10-CM | POA: Diagnosis not present

## 2020-09-25 DIAGNOSIS — K551 Chronic vascular disorders of intestine: Secondary | ICD-10-CM | POA: Diagnosis not present

## 2020-09-29 DIAGNOSIS — I774 Celiac artery compression syndrome: Secondary | ICD-10-CM | POA: Diagnosis not present

## 2020-09-29 DIAGNOSIS — K551 Chronic vascular disorders of intestine: Secondary | ICD-10-CM | POA: Diagnosis not present

## 2020-10-10 ENCOUNTER — Other Ambulatory Visit: Payer: Medicare HMO

## 2020-10-10 DIAGNOSIS — I10 Essential (primary) hypertension: Secondary | ICD-10-CM | POA: Diagnosis not present

## 2020-10-10 DIAGNOSIS — K551 Chronic vascular disorders of intestine: Secondary | ICD-10-CM | POA: Diagnosis not present

## 2020-10-10 DIAGNOSIS — I774 Celiac artery compression syndrome: Secondary | ICD-10-CM | POA: Diagnosis not present

## 2020-10-13 DIAGNOSIS — Z8249 Family history of ischemic heart disease and other diseases of the circulatory system: Secondary | ICD-10-CM | POA: Diagnosis not present

## 2020-10-13 DIAGNOSIS — Z0389 Encounter for observation for other suspected diseases and conditions ruled out: Secondary | ICD-10-CM | POA: Diagnosis not present

## 2020-10-13 DIAGNOSIS — R062 Wheezing: Secondary | ICD-10-CM | POA: Diagnosis not present

## 2020-10-13 DIAGNOSIS — E039 Hypothyroidism, unspecified: Secondary | ICD-10-CM | POA: Diagnosis not present

## 2020-10-13 DIAGNOSIS — K9189 Other postprocedural complications and disorders of digestive system: Secondary | ICD-10-CM | POA: Diagnosis not present

## 2020-10-13 DIAGNOSIS — I728 Aneurysm of other specified arteries: Secondary | ICD-10-CM | POA: Diagnosis not present

## 2020-10-13 DIAGNOSIS — I774 Celiac artery compression syndrome: Secondary | ICD-10-CM | POA: Diagnosis not present

## 2020-10-13 DIAGNOSIS — R1013 Epigastric pain: Secondary | ICD-10-CM | POA: Diagnosis not present

## 2020-10-13 DIAGNOSIS — Z6838 Body mass index (BMI) 38.0-38.9, adult: Secondary | ICD-10-CM | POA: Diagnosis not present

## 2020-10-13 DIAGNOSIS — R0689 Other abnormalities of breathing: Secondary | ICD-10-CM | POA: Diagnosis not present

## 2020-10-13 DIAGNOSIS — R933 Abnormal findings on diagnostic imaging of other parts of digestive tract: Secondary | ICD-10-CM | POA: Diagnosis not present

## 2020-10-13 DIAGNOSIS — N183 Chronic kidney disease, stage 3 unspecified: Secondary | ICD-10-CM | POA: Diagnosis not present

## 2020-10-13 DIAGNOSIS — K567 Ileus, unspecified: Secondary | ICD-10-CM | POA: Diagnosis not present

## 2020-10-13 DIAGNOSIS — K2950 Unspecified chronic gastritis without bleeding: Secondary | ICD-10-CM | POA: Diagnosis not present

## 2020-10-13 DIAGNOSIS — Z9889 Other specified postprocedural states: Secondary | ICD-10-CM | POA: Diagnosis not present

## 2020-10-13 DIAGNOSIS — R140 Abdominal distension (gaseous): Secondary | ICD-10-CM | POA: Diagnosis not present

## 2020-10-13 DIAGNOSIS — I129 Hypertensive chronic kidney disease with stage 1 through stage 4 chronic kidney disease, or unspecified chronic kidney disease: Secondary | ICD-10-CM | POA: Diagnosis not present

## 2020-10-13 DIAGNOSIS — I071 Rheumatic tricuspid insufficiency: Secondary | ICD-10-CM | POA: Diagnosis not present

## 2020-10-13 DIAGNOSIS — J90 Pleural effusion, not elsewhere classified: Secondary | ICD-10-CM | POA: Diagnosis not present

## 2020-10-13 DIAGNOSIS — Z86711 Personal history of pulmonary embolism: Secondary | ICD-10-CM | POA: Diagnosis not present

## 2020-10-13 DIAGNOSIS — K551 Chronic vascular disorders of intestine: Secondary | ICD-10-CM | POA: Diagnosis not present

## 2020-10-15 DIAGNOSIS — Z0389 Encounter for observation for other suspected diseases and conditions ruled out: Secondary | ICD-10-CM | POA: Diagnosis not present

## 2020-10-15 DIAGNOSIS — Z9889 Other specified postprocedural states: Secondary | ICD-10-CM | POA: Diagnosis not present

## 2020-10-16 DIAGNOSIS — K9189 Other postprocedural complications and disorders of digestive system: Secondary | ICD-10-CM | POA: Diagnosis not present

## 2020-10-16 DIAGNOSIS — I728 Aneurysm of other specified arteries: Secondary | ICD-10-CM | POA: Diagnosis not present

## 2020-10-16 DIAGNOSIS — J90 Pleural effusion, not elsewhere classified: Secondary | ICD-10-CM | POA: Diagnosis not present

## 2020-10-16 DIAGNOSIS — R062 Wheezing: Secondary | ICD-10-CM | POA: Diagnosis not present

## 2020-10-16 DIAGNOSIS — R0689 Other abnormalities of breathing: Secondary | ICD-10-CM | POA: Diagnosis not present

## 2020-10-16 DIAGNOSIS — I774 Celiac artery compression syndrome: Secondary | ICD-10-CM | POA: Diagnosis not present

## 2020-10-16 DIAGNOSIS — Z86711 Personal history of pulmonary embolism: Secondary | ICD-10-CM | POA: Diagnosis not present

## 2020-10-17 DIAGNOSIS — I774 Celiac artery compression syndrome: Secondary | ICD-10-CM | POA: Diagnosis not present

## 2020-10-17 DIAGNOSIS — R0689 Other abnormalities of breathing: Secondary | ICD-10-CM | POA: Diagnosis not present

## 2020-10-17 DIAGNOSIS — Z86711 Personal history of pulmonary embolism: Secondary | ICD-10-CM | POA: Diagnosis not present

## 2020-10-17 DIAGNOSIS — K9189 Other postprocedural complications and disorders of digestive system: Secondary | ICD-10-CM | POA: Diagnosis not present

## 2020-10-17 DIAGNOSIS — I728 Aneurysm of other specified arteries: Secondary | ICD-10-CM | POA: Diagnosis not present

## 2020-10-18 DIAGNOSIS — R140 Abdominal distension (gaseous): Secondary | ICD-10-CM | POA: Diagnosis not present

## 2020-10-18 DIAGNOSIS — K9189 Other postprocedural complications and disorders of digestive system: Secondary | ICD-10-CM | POA: Diagnosis not present

## 2020-10-18 DIAGNOSIS — I728 Aneurysm of other specified arteries: Secondary | ICD-10-CM | POA: Diagnosis not present

## 2020-10-18 DIAGNOSIS — K2950 Unspecified chronic gastritis without bleeding: Secondary | ICD-10-CM | POA: Diagnosis not present

## 2020-10-18 DIAGNOSIS — R0689 Other abnormalities of breathing: Secondary | ICD-10-CM | POA: Diagnosis not present

## 2020-10-18 DIAGNOSIS — I774 Celiac artery compression syndrome: Secondary | ICD-10-CM | POA: Diagnosis not present

## 2020-10-18 DIAGNOSIS — Z86711 Personal history of pulmonary embolism: Secondary | ICD-10-CM | POA: Diagnosis not present

## 2020-10-18 DIAGNOSIS — R933 Abnormal findings on diagnostic imaging of other parts of digestive tract: Secondary | ICD-10-CM | POA: Diagnosis not present

## 2020-10-19 DIAGNOSIS — K9189 Other postprocedural complications and disorders of digestive system: Secondary | ICD-10-CM | POA: Diagnosis not present

## 2020-10-19 DIAGNOSIS — R0689 Other abnormalities of breathing: Secondary | ICD-10-CM | POA: Diagnosis not present

## 2020-10-19 DIAGNOSIS — Z86711 Personal history of pulmonary embolism: Secondary | ICD-10-CM | POA: Diagnosis not present

## 2020-10-19 DIAGNOSIS — I774 Celiac artery compression syndrome: Secondary | ICD-10-CM | POA: Diagnosis not present

## 2020-10-19 DIAGNOSIS — I728 Aneurysm of other specified arteries: Secondary | ICD-10-CM | POA: Diagnosis not present

## 2020-10-20 DIAGNOSIS — Z86711 Personal history of pulmonary embolism: Secondary | ICD-10-CM | POA: Diagnosis not present

## 2020-10-20 DIAGNOSIS — K9189 Other postprocedural complications and disorders of digestive system: Secondary | ICD-10-CM | POA: Diagnosis not present

## 2020-10-20 DIAGNOSIS — I728 Aneurysm of other specified arteries: Secondary | ICD-10-CM | POA: Diagnosis not present

## 2020-10-20 DIAGNOSIS — I774 Celiac artery compression syndrome: Secondary | ICD-10-CM | POA: Diagnosis not present

## 2020-10-21 DIAGNOSIS — I774 Celiac artery compression syndrome: Secondary | ICD-10-CM | POA: Diagnosis not present

## 2020-10-23 ENCOUNTER — Telehealth (INDEPENDENT_AMBULATORY_CARE_PROVIDER_SITE_OTHER): Payer: Self-pay

## 2020-10-23 DIAGNOSIS — E039 Hypothyroidism, unspecified: Secondary | ICD-10-CM | POA: Diagnosis not present

## 2020-10-23 DIAGNOSIS — R32 Unspecified urinary incontinence: Secondary | ICD-10-CM | POA: Diagnosis not present

## 2020-10-23 DIAGNOSIS — Z48812 Encounter for surgical aftercare following surgery on the circulatory system: Secondary | ICD-10-CM | POA: Diagnosis not present

## 2020-10-23 DIAGNOSIS — K219 Gastro-esophageal reflux disease without esophagitis: Secondary | ICD-10-CM | POA: Diagnosis not present

## 2020-10-23 DIAGNOSIS — I774 Celiac artery compression syndrome: Secondary | ICD-10-CM | POA: Diagnosis not present

## 2020-10-23 DIAGNOSIS — N183 Chronic kidney disease, stage 3 unspecified: Secondary | ICD-10-CM | POA: Diagnosis not present

## 2020-10-23 DIAGNOSIS — I1310 Hypertensive heart and chronic kidney disease without heart failure, with stage 1 through stage 4 chronic kidney disease, or unspecified chronic kidney disease: Secondary | ICD-10-CM | POA: Diagnosis not present

## 2020-10-23 DIAGNOSIS — Z95828 Presence of other vascular implants and grafts: Secondary | ICD-10-CM | POA: Diagnosis not present

## 2020-10-23 DIAGNOSIS — I083 Combined rheumatic disorders of mitral, aortic and tricuspid valves: Secondary | ICD-10-CM | POA: Diagnosis not present

## 2020-10-23 NOTE — Telephone Encounter (Signed)
Latoya Freeman from [facility] called and said that she got a referral from [facility] and wanted to know would the provider here sign the orders please advise.

## 2020-10-23 NOTE — Telephone Encounter (Signed)
A referral for home health services from [facility]? No, we can't sign off on it because we aren't the ones that sent the orders

## 2020-10-23 NOTE — Telephone Encounter (Signed)
I called and left a VM for Colletta [place] making her aware of the NP's instructions.

## 2020-10-24 DIAGNOSIS — N183 Chronic kidney disease, stage 3 unspecified: Secondary | ICD-10-CM | POA: Diagnosis not present

## 2020-10-24 DIAGNOSIS — I774 Celiac artery compression syndrome: Secondary | ICD-10-CM | POA: Diagnosis not present

## 2020-10-24 DIAGNOSIS — I083 Combined rheumatic disorders of mitral, aortic and tricuspid valves: Secondary | ICD-10-CM | POA: Diagnosis not present

## 2020-10-24 DIAGNOSIS — Z48812 Encounter for surgical aftercare following surgery on the circulatory system: Secondary | ICD-10-CM | POA: Diagnosis not present

## 2020-10-24 DIAGNOSIS — R32 Unspecified urinary incontinence: Secondary | ICD-10-CM | POA: Diagnosis not present

## 2020-10-24 DIAGNOSIS — K219 Gastro-esophageal reflux disease without esophagitis: Secondary | ICD-10-CM | POA: Diagnosis not present

## 2020-10-24 DIAGNOSIS — I1310 Hypertensive heart and chronic kidney disease without heart failure, with stage 1 through stage 4 chronic kidney disease, or unspecified chronic kidney disease: Secondary | ICD-10-CM | POA: Diagnosis not present

## 2020-10-24 DIAGNOSIS — Z95828 Presence of other vascular implants and grafts: Secondary | ICD-10-CM | POA: Diagnosis not present

## 2020-10-24 DIAGNOSIS — E039 Hypothyroidism, unspecified: Secondary | ICD-10-CM | POA: Diagnosis not present

## 2020-10-27 DIAGNOSIS — R32 Unspecified urinary incontinence: Secondary | ICD-10-CM | POA: Diagnosis not present

## 2020-10-27 DIAGNOSIS — I774 Celiac artery compression syndrome: Secondary | ICD-10-CM | POA: Diagnosis not present

## 2020-10-27 DIAGNOSIS — N183 Chronic kidney disease, stage 3 unspecified: Secondary | ICD-10-CM | POA: Diagnosis not present

## 2020-10-27 DIAGNOSIS — I1310 Hypertensive heart and chronic kidney disease without heart failure, with stage 1 through stage 4 chronic kidney disease, or unspecified chronic kidney disease: Secondary | ICD-10-CM | POA: Diagnosis not present

## 2020-10-27 DIAGNOSIS — I083 Combined rheumatic disorders of mitral, aortic and tricuspid valves: Secondary | ICD-10-CM | POA: Diagnosis not present

## 2020-10-27 DIAGNOSIS — K219 Gastro-esophageal reflux disease without esophagitis: Secondary | ICD-10-CM | POA: Diagnosis not present

## 2020-10-27 DIAGNOSIS — E039 Hypothyroidism, unspecified: Secondary | ICD-10-CM | POA: Diagnosis not present

## 2020-10-27 DIAGNOSIS — Z95828 Presence of other vascular implants and grafts: Secondary | ICD-10-CM | POA: Diagnosis not present

## 2020-10-27 DIAGNOSIS — Z48812 Encounter for surgical aftercare following surgery on the circulatory system: Secondary | ICD-10-CM | POA: Diagnosis not present

## 2020-10-28 ENCOUNTER — Inpatient Hospital Stay: Payer: Medicare HMO | Attending: Oncology

## 2020-10-28 DIAGNOSIS — I728 Aneurysm of other specified arteries: Secondary | ICD-10-CM | POA: Diagnosis not present

## 2020-10-28 DIAGNOSIS — I083 Combined rheumatic disorders of mitral, aortic and tricuspid valves: Secondary | ICD-10-CM | POA: Diagnosis not present

## 2020-10-28 DIAGNOSIS — Z87891 Personal history of nicotine dependence: Secondary | ICD-10-CM | POA: Diagnosis not present

## 2020-10-28 DIAGNOSIS — K219 Gastro-esophageal reflux disease without esophagitis: Secondary | ICD-10-CM | POA: Diagnosis not present

## 2020-10-28 DIAGNOSIS — I10 Essential (primary) hypertension: Secondary | ICD-10-CM | POA: Diagnosis not present

## 2020-10-28 DIAGNOSIS — Z48812 Encounter for surgical aftercare following surgery on the circulatory system: Secondary | ICD-10-CM | POA: Diagnosis not present

## 2020-10-28 DIAGNOSIS — I774 Celiac artery compression syndrome: Secondary | ICD-10-CM | POA: Diagnosis not present

## 2020-10-28 DIAGNOSIS — D509 Iron deficiency anemia, unspecified: Secondary | ICD-10-CM | POA: Insufficient documentation

## 2020-10-28 DIAGNOSIS — E039 Hypothyroidism, unspecified: Secondary | ICD-10-CM | POA: Diagnosis not present

## 2020-10-28 DIAGNOSIS — I1310 Hypertensive heart and chronic kidney disease without heart failure, with stage 1 through stage 4 chronic kidney disease, or unspecified chronic kidney disease: Secondary | ICD-10-CM | POA: Diagnosis not present

## 2020-10-28 DIAGNOSIS — I2782 Chronic pulmonary embolism: Secondary | ICD-10-CM | POA: Diagnosis not present

## 2020-10-28 DIAGNOSIS — Z7901 Long term (current) use of anticoagulants: Secondary | ICD-10-CM | POA: Diagnosis not present

## 2020-10-28 DIAGNOSIS — N183 Chronic kidney disease, stage 3 unspecified: Secondary | ICD-10-CM | POA: Diagnosis not present

## 2020-10-28 LAB — CBC WITH DIFFERENTIAL/PLATELET
Abs Immature Granulocytes: 0.06 10*3/uL (ref 0.00–0.07)
Basophils Absolute: 0.1 10*3/uL (ref 0.0–0.1)
Basophils Relative: 1 %
Eosinophils Absolute: 0.1 10*3/uL (ref 0.0–0.5)
Eosinophils Relative: 2 %
HCT: 40.2 % (ref 36.0–46.0)
Hemoglobin: 13.9 g/dL (ref 12.0–15.0)
Immature Granulocytes: 1 %
Lymphocytes Relative: 23 %
Lymphs Abs: 2 10*3/uL (ref 0.7–4.0)
MCH: 30.3 pg (ref 26.0–34.0)
MCHC: 34.6 g/dL (ref 30.0–36.0)
MCV: 87.8 fL (ref 80.0–100.0)
Monocytes Absolute: 0.8 10*3/uL (ref 0.1–1.0)
Monocytes Relative: 9 %
Neutro Abs: 5.6 10*3/uL (ref 1.7–7.7)
Neutrophils Relative %: 64 %
Platelets: 479 10*3/uL — ABNORMAL HIGH (ref 150–400)
RBC: 4.58 MIL/uL (ref 3.87–5.11)
RDW: 15.5 % (ref 11.5–15.5)
WBC: 8.6 10*3/uL (ref 4.0–10.5)
nRBC: 0 % (ref 0.0–0.2)

## 2020-10-28 LAB — IRON AND TIBC
Iron: 101 ug/dL (ref 28–170)
Saturation Ratios: 28 % (ref 10.4–31.8)
TIBC: 356 ug/dL (ref 250–450)
UIBC: 255 ug/dL

## 2020-10-28 LAB — FERRITIN: Ferritin: 187 ng/mL (ref 11–307)

## 2020-10-29 DIAGNOSIS — I083 Combined rheumatic disorders of mitral, aortic and tricuspid valves: Secondary | ICD-10-CM | POA: Diagnosis not present

## 2020-10-29 DIAGNOSIS — Z48812 Encounter for surgical aftercare following surgery on the circulatory system: Secondary | ICD-10-CM | POA: Diagnosis not present

## 2020-10-29 DIAGNOSIS — I1310 Hypertensive heart and chronic kidney disease without heart failure, with stage 1 through stage 4 chronic kidney disease, or unspecified chronic kidney disease: Secondary | ICD-10-CM | POA: Diagnosis not present

## 2020-10-29 DIAGNOSIS — Z95828 Presence of other vascular implants and grafts: Secondary | ICD-10-CM | POA: Diagnosis not present

## 2020-10-29 DIAGNOSIS — R32 Unspecified urinary incontinence: Secondary | ICD-10-CM | POA: Diagnosis not present

## 2020-10-29 DIAGNOSIS — E039 Hypothyroidism, unspecified: Secondary | ICD-10-CM | POA: Diagnosis not present

## 2020-10-29 DIAGNOSIS — K219 Gastro-esophageal reflux disease without esophagitis: Secondary | ICD-10-CM | POA: Diagnosis not present

## 2020-10-29 DIAGNOSIS — N183 Chronic kidney disease, stage 3 unspecified: Secondary | ICD-10-CM | POA: Diagnosis not present

## 2020-10-29 DIAGNOSIS — I774 Celiac artery compression syndrome: Secondary | ICD-10-CM | POA: Diagnosis not present

## 2020-10-30 ENCOUNTER — Other Ambulatory Visit: Payer: Self-pay

## 2020-10-30 ENCOUNTER — Telehealth: Payer: Self-pay | Admitting: *Deleted

## 2020-10-30 ENCOUNTER — Inpatient Hospital Stay: Payer: Medicare HMO | Admitting: Oncology

## 2020-10-30 DIAGNOSIS — R32 Unspecified urinary incontinence: Secondary | ICD-10-CM | POA: Diagnosis not present

## 2020-10-30 DIAGNOSIS — Z48812 Encounter for surgical aftercare following surgery on the circulatory system: Secondary | ICD-10-CM | POA: Diagnosis not present

## 2020-10-30 DIAGNOSIS — I774 Celiac artery compression syndrome: Secondary | ICD-10-CM | POA: Diagnosis not present

## 2020-10-30 DIAGNOSIS — I083 Combined rheumatic disorders of mitral, aortic and tricuspid valves: Secondary | ICD-10-CM | POA: Diagnosis not present

## 2020-10-30 DIAGNOSIS — E039 Hypothyroidism, unspecified: Secondary | ICD-10-CM | POA: Diagnosis not present

## 2020-10-30 DIAGNOSIS — N183 Chronic kidney disease, stage 3 unspecified: Secondary | ICD-10-CM | POA: Diagnosis not present

## 2020-10-30 DIAGNOSIS — I1310 Hypertensive heart and chronic kidney disease without heart failure, with stage 1 through stage 4 chronic kidney disease, or unspecified chronic kidney disease: Secondary | ICD-10-CM | POA: Diagnosis not present

## 2020-10-30 DIAGNOSIS — Z95828 Presence of other vascular implants and grafts: Secondary | ICD-10-CM | POA: Diagnosis not present

## 2020-10-30 DIAGNOSIS — K219 Gastro-esophageal reflux disease without esophagitis: Secondary | ICD-10-CM | POA: Diagnosis not present

## 2020-10-30 NOTE — Telephone Encounter (Signed)
Called the home phone for pt and husband answered and said that she is recovering at her daughter's house from a big surgery in [place]. She is suppose to go to md in [place] tomorrow to get stitches out.  I told the husband that I will call her daughter and see what they want to do with the video visit today. I called Shelia and she wanted to know her labs. The labs were good. hgb normal, ferritin normal. I spoke to Janese Banks and she said since her labs were so good we would be able to move her out 3 months with labs and see md 1 day after labs through video. Called daughter and let her know and shelia is ok with this and she sees the appts on  My chart

## 2020-10-31 DIAGNOSIS — I774 Celiac artery compression syndrome: Secondary | ICD-10-CM | POA: Diagnosis not present

## 2020-10-31 DIAGNOSIS — K551 Chronic vascular disorders of intestine: Secondary | ICD-10-CM | POA: Diagnosis not present

## 2020-11-04 DIAGNOSIS — N183 Chronic kidney disease, stage 3 unspecified: Secondary | ICD-10-CM | POA: Diagnosis not present

## 2020-11-04 DIAGNOSIS — Z48812 Encounter for surgical aftercare following surgery on the circulatory system: Secondary | ICD-10-CM | POA: Diagnosis not present

## 2020-11-04 DIAGNOSIS — I1310 Hypertensive heart and chronic kidney disease without heart failure, with stage 1 through stage 4 chronic kidney disease, or unspecified chronic kidney disease: Secondary | ICD-10-CM | POA: Diagnosis not present

## 2020-11-04 DIAGNOSIS — I774 Celiac artery compression syndrome: Secondary | ICD-10-CM | POA: Diagnosis not present

## 2020-11-04 DIAGNOSIS — E039 Hypothyroidism, unspecified: Secondary | ICD-10-CM | POA: Diagnosis not present

## 2020-11-04 DIAGNOSIS — Z95828 Presence of other vascular implants and grafts: Secondary | ICD-10-CM | POA: Diagnosis not present

## 2020-11-04 DIAGNOSIS — R32 Unspecified urinary incontinence: Secondary | ICD-10-CM | POA: Diagnosis not present

## 2020-11-04 DIAGNOSIS — I083 Combined rheumatic disorders of mitral, aortic and tricuspid valves: Secondary | ICD-10-CM | POA: Diagnosis not present

## 2020-11-04 DIAGNOSIS — K219 Gastro-esophageal reflux disease without esophagitis: Secondary | ICD-10-CM | POA: Diagnosis not present

## 2020-11-05 DIAGNOSIS — Z48812 Encounter for surgical aftercare following surgery on the circulatory system: Secondary | ICD-10-CM | POA: Diagnosis not present

## 2020-11-05 DIAGNOSIS — E039 Hypothyroidism, unspecified: Secondary | ICD-10-CM | POA: Diagnosis not present

## 2020-11-05 DIAGNOSIS — R32 Unspecified urinary incontinence: Secondary | ICD-10-CM | POA: Diagnosis not present

## 2020-11-05 DIAGNOSIS — K219 Gastro-esophageal reflux disease without esophagitis: Secondary | ICD-10-CM | POA: Diagnosis not present

## 2020-11-05 DIAGNOSIS — Z95828 Presence of other vascular implants and grafts: Secondary | ICD-10-CM | POA: Diagnosis not present

## 2020-11-05 DIAGNOSIS — I083 Combined rheumatic disorders of mitral, aortic and tricuspid valves: Secondary | ICD-10-CM | POA: Diagnosis not present

## 2020-11-05 DIAGNOSIS — N183 Chronic kidney disease, stage 3 unspecified: Secondary | ICD-10-CM | POA: Diagnosis not present

## 2020-11-05 DIAGNOSIS — I774 Celiac artery compression syndrome: Secondary | ICD-10-CM | POA: Diagnosis not present

## 2020-11-05 DIAGNOSIS — I1310 Hypertensive heart and chronic kidney disease without heart failure, with stage 1 through stage 4 chronic kidney disease, or unspecified chronic kidney disease: Secondary | ICD-10-CM | POA: Diagnosis not present

## 2020-11-06 DIAGNOSIS — I1310 Hypertensive heart and chronic kidney disease without heart failure, with stage 1 through stage 4 chronic kidney disease, or unspecified chronic kidney disease: Secondary | ICD-10-CM | POA: Diagnosis not present

## 2020-11-06 DIAGNOSIS — R32 Unspecified urinary incontinence: Secondary | ICD-10-CM | POA: Diagnosis not present

## 2020-11-06 DIAGNOSIS — K219 Gastro-esophageal reflux disease without esophagitis: Secondary | ICD-10-CM | POA: Diagnosis not present

## 2020-11-06 DIAGNOSIS — Z95828 Presence of other vascular implants and grafts: Secondary | ICD-10-CM | POA: Diagnosis not present

## 2020-11-06 DIAGNOSIS — Z48812 Encounter for surgical aftercare following surgery on the circulatory system: Secondary | ICD-10-CM | POA: Diagnosis not present

## 2020-11-06 DIAGNOSIS — I774 Celiac artery compression syndrome: Secondary | ICD-10-CM | POA: Diagnosis not present

## 2020-11-06 DIAGNOSIS — N183 Chronic kidney disease, stage 3 unspecified: Secondary | ICD-10-CM | POA: Diagnosis not present

## 2020-11-06 DIAGNOSIS — I083 Combined rheumatic disorders of mitral, aortic and tricuspid valves: Secondary | ICD-10-CM | POA: Diagnosis not present

## 2020-11-06 DIAGNOSIS — E039 Hypothyroidism, unspecified: Secondary | ICD-10-CM | POA: Diagnosis not present

## 2020-11-10 DIAGNOSIS — Z95828 Presence of other vascular implants and grafts: Secondary | ICD-10-CM | POA: Diagnosis not present

## 2020-11-10 DIAGNOSIS — E039 Hypothyroidism, unspecified: Secondary | ICD-10-CM | POA: Diagnosis not present

## 2020-11-10 DIAGNOSIS — N183 Chronic kidney disease, stage 3 unspecified: Secondary | ICD-10-CM | POA: Diagnosis not present

## 2020-11-10 DIAGNOSIS — I083 Combined rheumatic disorders of mitral, aortic and tricuspid valves: Secondary | ICD-10-CM | POA: Diagnosis not present

## 2020-11-10 DIAGNOSIS — Z48812 Encounter for surgical aftercare following surgery on the circulatory system: Secondary | ICD-10-CM | POA: Diagnosis not present

## 2020-11-10 DIAGNOSIS — I774 Celiac artery compression syndrome: Secondary | ICD-10-CM | POA: Diagnosis not present

## 2020-11-10 DIAGNOSIS — R32 Unspecified urinary incontinence: Secondary | ICD-10-CM | POA: Diagnosis not present

## 2020-11-10 DIAGNOSIS — K219 Gastro-esophageal reflux disease without esophagitis: Secondary | ICD-10-CM | POA: Diagnosis not present

## 2020-11-10 DIAGNOSIS — I1310 Hypertensive heart and chronic kidney disease without heart failure, with stage 1 through stage 4 chronic kidney disease, or unspecified chronic kidney disease: Secondary | ICD-10-CM | POA: Diagnosis not present

## 2020-11-11 DIAGNOSIS — K219 Gastro-esophageal reflux disease without esophagitis: Secondary | ICD-10-CM | POA: Diagnosis not present

## 2020-11-11 DIAGNOSIS — Z48812 Encounter for surgical aftercare following surgery on the circulatory system: Secondary | ICD-10-CM | POA: Diagnosis not present

## 2020-11-11 DIAGNOSIS — Z95828 Presence of other vascular implants and grafts: Secondary | ICD-10-CM | POA: Diagnosis not present

## 2020-11-11 DIAGNOSIS — I083 Combined rheumatic disorders of mitral, aortic and tricuspid valves: Secondary | ICD-10-CM | POA: Diagnosis not present

## 2020-11-11 DIAGNOSIS — I774 Celiac artery compression syndrome: Secondary | ICD-10-CM | POA: Diagnosis not present

## 2020-11-11 DIAGNOSIS — I1310 Hypertensive heart and chronic kidney disease without heart failure, with stage 1 through stage 4 chronic kidney disease, or unspecified chronic kidney disease: Secondary | ICD-10-CM | POA: Diagnosis not present

## 2020-11-11 DIAGNOSIS — E039 Hypothyroidism, unspecified: Secondary | ICD-10-CM | POA: Diagnosis not present

## 2020-11-11 DIAGNOSIS — R32 Unspecified urinary incontinence: Secondary | ICD-10-CM | POA: Diagnosis not present

## 2020-11-11 DIAGNOSIS — N183 Chronic kidney disease, stage 3 unspecified: Secondary | ICD-10-CM | POA: Diagnosis not present

## 2020-11-12 DIAGNOSIS — N183 Chronic kidney disease, stage 3 unspecified: Secondary | ICD-10-CM | POA: Diagnosis not present

## 2020-11-12 DIAGNOSIS — R32 Unspecified urinary incontinence: Secondary | ICD-10-CM | POA: Diagnosis not present

## 2020-11-12 DIAGNOSIS — Z95828 Presence of other vascular implants and grafts: Secondary | ICD-10-CM | POA: Diagnosis not present

## 2020-11-12 DIAGNOSIS — E039 Hypothyroidism, unspecified: Secondary | ICD-10-CM | POA: Diagnosis not present

## 2020-11-12 DIAGNOSIS — I083 Combined rheumatic disorders of mitral, aortic and tricuspid valves: Secondary | ICD-10-CM | POA: Diagnosis not present

## 2020-11-12 DIAGNOSIS — I774 Celiac artery compression syndrome: Secondary | ICD-10-CM | POA: Diagnosis not present

## 2020-11-12 DIAGNOSIS — Z48812 Encounter for surgical aftercare following surgery on the circulatory system: Secondary | ICD-10-CM | POA: Diagnosis not present

## 2020-11-12 DIAGNOSIS — I1310 Hypertensive heart and chronic kidney disease without heart failure, with stage 1 through stage 4 chronic kidney disease, or unspecified chronic kidney disease: Secondary | ICD-10-CM | POA: Diagnosis not present

## 2020-11-12 DIAGNOSIS — K219 Gastro-esophageal reflux disease without esophagitis: Secondary | ICD-10-CM | POA: Diagnosis not present

## 2020-11-13 DIAGNOSIS — Z95828 Presence of other vascular implants and grafts: Secondary | ICD-10-CM | POA: Diagnosis not present

## 2020-11-13 DIAGNOSIS — I774 Celiac artery compression syndrome: Secondary | ICD-10-CM | POA: Diagnosis not present

## 2020-11-13 DIAGNOSIS — E039 Hypothyroidism, unspecified: Secondary | ICD-10-CM | POA: Diagnosis not present

## 2020-11-13 DIAGNOSIS — Z48812 Encounter for surgical aftercare following surgery on the circulatory system: Secondary | ICD-10-CM | POA: Diagnosis not present

## 2020-11-13 DIAGNOSIS — I1310 Hypertensive heart and chronic kidney disease without heart failure, with stage 1 through stage 4 chronic kidney disease, or unspecified chronic kidney disease: Secondary | ICD-10-CM | POA: Diagnosis not present

## 2020-11-13 DIAGNOSIS — I083 Combined rheumatic disorders of mitral, aortic and tricuspid valves: Secondary | ICD-10-CM | POA: Diagnosis not present

## 2020-11-13 DIAGNOSIS — K219 Gastro-esophageal reflux disease without esophagitis: Secondary | ICD-10-CM | POA: Diagnosis not present

## 2020-11-13 DIAGNOSIS — N183 Chronic kidney disease, stage 3 unspecified: Secondary | ICD-10-CM | POA: Diagnosis not present

## 2020-11-13 DIAGNOSIS — R32 Unspecified urinary incontinence: Secondary | ICD-10-CM | POA: Diagnosis not present

## 2020-11-19 DIAGNOSIS — I1310 Hypertensive heart and chronic kidney disease without heart failure, with stage 1 through stage 4 chronic kidney disease, or unspecified chronic kidney disease: Secondary | ICD-10-CM | POA: Diagnosis not present

## 2020-11-19 DIAGNOSIS — N183 Chronic kidney disease, stage 3 unspecified: Secondary | ICD-10-CM | POA: Diagnosis not present

## 2020-11-19 DIAGNOSIS — I083 Combined rheumatic disorders of mitral, aortic and tricuspid valves: Secondary | ICD-10-CM | POA: Diagnosis not present

## 2020-11-19 DIAGNOSIS — Z48812 Encounter for surgical aftercare following surgery on the circulatory system: Secondary | ICD-10-CM | POA: Diagnosis not present

## 2020-11-19 DIAGNOSIS — K219 Gastro-esophageal reflux disease without esophagitis: Secondary | ICD-10-CM | POA: Diagnosis not present

## 2020-11-19 DIAGNOSIS — Z95828 Presence of other vascular implants and grafts: Secondary | ICD-10-CM | POA: Diagnosis not present

## 2020-11-19 DIAGNOSIS — I774 Celiac artery compression syndrome: Secondary | ICD-10-CM | POA: Diagnosis not present

## 2020-11-19 DIAGNOSIS — R32 Unspecified urinary incontinence: Secondary | ICD-10-CM | POA: Diagnosis not present

## 2020-11-19 DIAGNOSIS — E039 Hypothyroidism, unspecified: Secondary | ICD-10-CM | POA: Diagnosis not present

## 2020-12-03 ENCOUNTER — Other Ambulatory Visit: Payer: Self-pay | Admitting: Oncology

## 2020-12-03 DIAGNOSIS — G8929 Other chronic pain: Secondary | ICD-10-CM | POA: Diagnosis not present

## 2020-12-03 DIAGNOSIS — M25461 Effusion, right knee: Secondary | ICD-10-CM | POA: Diagnosis not present

## 2020-12-03 DIAGNOSIS — M1711 Unilateral primary osteoarthritis, right knee: Secondary | ICD-10-CM | POA: Diagnosis not present

## 2020-12-03 DIAGNOSIS — M170 Bilateral primary osteoarthritis of knee: Secondary | ICD-10-CM | POA: Diagnosis not present

## 2020-12-03 DIAGNOSIS — M25561 Pain in right knee: Secondary | ICD-10-CM | POA: Diagnosis not present

## 2020-12-03 DIAGNOSIS — M25562 Pain in left knee: Secondary | ICD-10-CM | POA: Diagnosis not present

## 2020-12-03 DIAGNOSIS — M1712 Unilateral primary osteoarthritis, left knee: Secondary | ICD-10-CM | POA: Diagnosis not present

## 2020-12-17 ENCOUNTER — Other Ambulatory Visit: Payer: Self-pay | Admitting: Family Medicine

## 2020-12-17 ENCOUNTER — Ambulatory Visit
Admission: RE | Admit: 2020-12-17 | Discharge: 2020-12-17 | Disposition: A | Discharge status: Home / Self Care | Payer: Medicare HMO | Source: Ambulatory Visit | Attending: Family Medicine | Admitting: Family Medicine

## 2020-12-17 ENCOUNTER — Other Ambulatory Visit: Payer: Self-pay

## 2020-12-17 DIAGNOSIS — M79652 Pain in left thigh: Secondary | ICD-10-CM

## 2020-12-17 DIAGNOSIS — M79605 Pain in left leg: Secondary | ICD-10-CM | POA: Diagnosis not present

## 2020-12-17 DIAGNOSIS — M79662 Pain in left lower leg: Secondary | ICD-10-CM | POA: Diagnosis not present

## 2021-01-09 DIAGNOSIS — M5134 Other intervertebral disc degeneration, thoracic region: Secondary | ICD-10-CM | POA: Diagnosis not present

## 2021-01-09 DIAGNOSIS — M62830 Muscle spasm of back: Secondary | ICD-10-CM | POA: Diagnosis not present

## 2021-01-09 DIAGNOSIS — M9903 Segmental and somatic dysfunction of lumbar region: Secondary | ICD-10-CM | POA: Diagnosis not present

## 2021-01-09 DIAGNOSIS — M9902 Segmental and somatic dysfunction of thoracic region: Secondary | ICD-10-CM | POA: Diagnosis not present

## 2021-01-12 DIAGNOSIS — M9902 Segmental and somatic dysfunction of thoracic region: Secondary | ICD-10-CM | POA: Diagnosis not present

## 2021-01-12 DIAGNOSIS — M62830 Muscle spasm of back: Secondary | ICD-10-CM | POA: Diagnosis not present

## 2021-01-12 DIAGNOSIS — M5134 Other intervertebral disc degeneration, thoracic region: Secondary | ICD-10-CM | POA: Diagnosis not present

## 2021-01-12 DIAGNOSIS — M9903 Segmental and somatic dysfunction of lumbar region: Secondary | ICD-10-CM | POA: Diagnosis not present

## 2021-01-14 DIAGNOSIS — M62830 Muscle spasm of back: Secondary | ICD-10-CM | POA: Diagnosis not present

## 2021-01-14 DIAGNOSIS — M9903 Segmental and somatic dysfunction of lumbar region: Secondary | ICD-10-CM | POA: Diagnosis not present

## 2021-01-14 DIAGNOSIS — M9902 Segmental and somatic dysfunction of thoracic region: Secondary | ICD-10-CM | POA: Diagnosis not present

## 2021-01-14 DIAGNOSIS — M5134 Other intervertebral disc degeneration, thoracic region: Secondary | ICD-10-CM | POA: Diagnosis not present

## 2021-01-16 DIAGNOSIS — M5134 Other intervertebral disc degeneration, thoracic region: Secondary | ICD-10-CM | POA: Diagnosis not present

## 2021-01-16 DIAGNOSIS — M62830 Muscle spasm of back: Secondary | ICD-10-CM | POA: Diagnosis not present

## 2021-01-16 DIAGNOSIS — M9903 Segmental and somatic dysfunction of lumbar region: Secondary | ICD-10-CM | POA: Diagnosis not present

## 2021-01-16 DIAGNOSIS — M9902 Segmental and somatic dysfunction of thoracic region: Secondary | ICD-10-CM | POA: Diagnosis not present

## 2021-01-19 DIAGNOSIS — M9902 Segmental and somatic dysfunction of thoracic region: Secondary | ICD-10-CM | POA: Diagnosis not present

## 2021-01-19 DIAGNOSIS — M62830 Muscle spasm of back: Secondary | ICD-10-CM | POA: Diagnosis not present

## 2021-01-19 DIAGNOSIS — M5134 Other intervertebral disc degeneration, thoracic region: Secondary | ICD-10-CM | POA: Diagnosis not present

## 2021-01-19 DIAGNOSIS — M9903 Segmental and somatic dysfunction of lumbar region: Secondary | ICD-10-CM | POA: Diagnosis not present

## 2021-01-22 DIAGNOSIS — M62830 Muscle spasm of back: Secondary | ICD-10-CM | POA: Diagnosis not present

## 2021-01-22 DIAGNOSIS — M9903 Segmental and somatic dysfunction of lumbar region: Secondary | ICD-10-CM | POA: Diagnosis not present

## 2021-01-22 DIAGNOSIS — M5134 Other intervertebral disc degeneration, thoracic region: Secondary | ICD-10-CM | POA: Diagnosis not present

## 2021-01-22 DIAGNOSIS — M9902 Segmental and somatic dysfunction of thoracic region: Secondary | ICD-10-CM | POA: Diagnosis not present

## 2021-01-26 DIAGNOSIS — M9902 Segmental and somatic dysfunction of thoracic region: Secondary | ICD-10-CM | POA: Diagnosis not present

## 2021-01-26 DIAGNOSIS — M9903 Segmental and somatic dysfunction of lumbar region: Secondary | ICD-10-CM | POA: Diagnosis not present

## 2021-01-26 DIAGNOSIS — M62830 Muscle spasm of back: Secondary | ICD-10-CM | POA: Diagnosis not present

## 2021-01-26 DIAGNOSIS — M5134 Other intervertebral disc degeneration, thoracic region: Secondary | ICD-10-CM | POA: Diagnosis not present

## 2021-01-28 DIAGNOSIS — I774 Celiac artery compression syndrome: Secondary | ICD-10-CM | POA: Diagnosis not present

## 2021-01-28 DIAGNOSIS — K551 Chronic vascular disorders of intestine: Secondary | ICD-10-CM | POA: Diagnosis not present

## 2021-01-29 DIAGNOSIS — M5134 Other intervertebral disc degeneration, thoracic region: Secondary | ICD-10-CM | POA: Diagnosis not present

## 2021-01-29 DIAGNOSIS — M9902 Segmental and somatic dysfunction of thoracic region: Secondary | ICD-10-CM | POA: Diagnosis not present

## 2021-01-29 DIAGNOSIS — M9903 Segmental and somatic dysfunction of lumbar region: Secondary | ICD-10-CM | POA: Diagnosis not present

## 2021-01-29 DIAGNOSIS — M62830 Muscle spasm of back: Secondary | ICD-10-CM | POA: Diagnosis not present

## 2021-01-30 ENCOUNTER — Other Ambulatory Visit: Payer: Self-pay | Admitting: *Deleted

## 2021-01-30 ENCOUNTER — Inpatient Hospital Stay: Payer: Medicare HMO | Attending: Oncology

## 2021-01-30 ENCOUNTER — Other Ambulatory Visit: Payer: Self-pay

## 2021-01-30 DIAGNOSIS — D508 Other iron deficiency anemias: Secondary | ICD-10-CM | POA: Diagnosis not present

## 2021-01-30 LAB — CBC
HCT: 34.7 % — ABNORMAL LOW (ref 36.0–46.0)
Hemoglobin: 11.7 g/dL — ABNORMAL LOW (ref 12.0–15.0)
MCH: 30.2 pg (ref 26.0–34.0)
MCHC: 33.7 g/dL (ref 30.0–36.0)
MCV: 89.4 fL (ref 80.0–100.0)
Platelets: 260 10*3/uL (ref 150–400)
RBC: 3.88 MIL/uL (ref 3.87–5.11)
RDW: 12.5 % (ref 11.5–15.5)
WBC: 6.8 10*3/uL (ref 4.0–10.5)
nRBC: 0 % (ref 0.0–0.2)

## 2021-01-30 LAB — IRON AND TIBC
Iron: 55 ug/dL (ref 28–170)
Saturation Ratios: 15 % (ref 10.4–31.8)
TIBC: 368 ug/dL (ref 250–450)
UIBC: 313 ug/dL

## 2021-01-30 LAB — FERRITIN: Ferritin: 11 ng/mL (ref 11–307)

## 2021-02-02 ENCOUNTER — Telehealth: Payer: Self-pay | Admitting: Oncology

## 2021-02-02 ENCOUNTER — Inpatient Hospital Stay: Payer: Medicare HMO | Admitting: Oncology

## 2021-02-02 DIAGNOSIS — M62830 Muscle spasm of back: Secondary | ICD-10-CM | POA: Diagnosis not present

## 2021-02-02 DIAGNOSIS — M9902 Segmental and somatic dysfunction of thoracic region: Secondary | ICD-10-CM | POA: Diagnosis not present

## 2021-02-02 DIAGNOSIS — M9903 Segmental and somatic dysfunction of lumbar region: Secondary | ICD-10-CM | POA: Diagnosis not present

## 2021-02-02 DIAGNOSIS — M5134 Other intervertebral disc degeneration, thoracic region: Secondary | ICD-10-CM | POA: Diagnosis not present

## 2021-02-02 NOTE — Telephone Encounter (Signed)
Spoke with pt's daughter Freda Munro) about missed appointment today (3/14). Per MD patient can r/s on 3/16 at 12pm-virtual appt. Daughter was agreeable to this and mentioned that she would call back if there were any conflicts. Attempt made to reach pt at home but there was no answer/no option to leave a VM.

## 2021-02-04 ENCOUNTER — Inpatient Hospital Stay: Payer: Medicare HMO | Admitting: Oncology

## 2021-02-05 ENCOUNTER — Telehealth: Payer: Self-pay | Admitting: Oncology

## 2021-02-05 DIAGNOSIS — M9903 Segmental and somatic dysfunction of lumbar region: Secondary | ICD-10-CM | POA: Diagnosis not present

## 2021-02-05 DIAGNOSIS — M62830 Muscle spasm of back: Secondary | ICD-10-CM | POA: Diagnosis not present

## 2021-02-05 DIAGNOSIS — M9902 Segmental and somatic dysfunction of thoracic region: Secondary | ICD-10-CM | POA: Diagnosis not present

## 2021-02-05 DIAGNOSIS — M5134 Other intervertebral disc degeneration, thoracic region: Secondary | ICD-10-CM | POA: Diagnosis not present

## 2021-02-05 NOTE — Telephone Encounter (Signed)
Left VM with pt and requested a call back to r/s missed virtual appt with Dr. Janese Banks.

## 2021-02-09 DIAGNOSIS — M9902 Segmental and somatic dysfunction of thoracic region: Secondary | ICD-10-CM | POA: Diagnosis not present

## 2021-02-09 DIAGNOSIS — M5134 Other intervertebral disc degeneration, thoracic region: Secondary | ICD-10-CM | POA: Diagnosis not present

## 2021-02-09 DIAGNOSIS — M9903 Segmental and somatic dysfunction of lumbar region: Secondary | ICD-10-CM | POA: Diagnosis not present

## 2021-02-09 DIAGNOSIS — M62830 Muscle spasm of back: Secondary | ICD-10-CM | POA: Diagnosis not present

## 2021-02-12 ENCOUNTER — Other Ambulatory Visit: Payer: Self-pay

## 2021-02-12 ENCOUNTER — Ambulatory Visit
Admission: RE | Admit: 2021-02-12 | Discharge: 2021-02-12 | Disposition: A | Discharge status: Home / Self Care | Payer: Medicare HMO | Source: Ambulatory Visit | Attending: Sports Medicine | Admitting: Sports Medicine

## 2021-02-12 VITALS — BP 118/65 | HR 60 | Temp 98.5°F | Resp 18 | Ht 60.0 in | Wt 174.0 lb

## 2021-02-12 DIAGNOSIS — R200 Anesthesia of skin: Secondary | ICD-10-CM | POA: Diagnosis not present

## 2021-02-12 DIAGNOSIS — R1032 Left lower quadrant pain: Secondary | ICD-10-CM | POA: Diagnosis not present

## 2021-02-12 DIAGNOSIS — G8929 Other chronic pain: Secondary | ICD-10-CM | POA: Diagnosis not present

## 2021-02-12 DIAGNOSIS — M5416 Radiculopathy, lumbar region: Secondary | ICD-10-CM | POA: Diagnosis not present

## 2021-02-12 DIAGNOSIS — R202 Paresthesia of skin: Secondary | ICD-10-CM

## 2021-02-12 DIAGNOSIS — M25552 Pain in left hip: Secondary | ICD-10-CM | POA: Diagnosis not present

## 2021-02-12 DIAGNOSIS — M5442 Lumbago with sciatica, left side: Secondary | ICD-10-CM | POA: Diagnosis not present

## 2021-02-12 MED ORDER — TRAMADOL HCL 50 MG PO TABS: 50.0000 mg | ORAL_TABLET | Freq: Four times a day (QID) | ORAL | 0 refills | Status: DC | PRN

## 2021-02-12 NOTE — Discharge Instructions (Signed)
Your examination is consistent with 2 issues. The first is chronic left-sided low back buttock and leg pain.  You have numbness and tingling going down your leg that I believe is coming from your back.  It is nerve root irritation, probably spinal stenosis. The second issue is left groin and hip pain.  You probably have either some form of pain from compensation or you may have some arthritis in your left hip. I would have like to put you on prednisone, but I am not comfortable because you are on Plavix and Eliquis.  Just use Tylenol.  I added in some tramadol to see if that helps.  You have a follow-up appointment with orthopedics next week.  I would call them and see if you could be seen earlier and when you see them you can discuss whether the tramadol is helping. As I said without trauma doing x-rays today would not be beneficial.  They are going to do their own films when you see orthopedics. Educational handouts were provided.  If your symptoms worsen in any way before you see orthopedics please go to the emergency room for higher level of care and potential scanning.  I hope you begin to feeling better, Dr. Drema Dallas

## 2021-02-12 NOTE — ED Triage Notes (Signed)
Patient c/o left hip that started 3 months ago. She has seen the chiropractor for this but states he has not been able to help her this time. She is having to use her walker over the last 2 weeks because the pain is so bad.

## 2021-02-13 ENCOUNTER — Inpatient Hospital Stay: Payer: Medicare HMO | Admitting: Oncology

## 2021-02-17 DIAGNOSIS — M5416 Radiculopathy, lumbar region: Secondary | ICD-10-CM | POA: Diagnosis not present

## 2021-02-17 DIAGNOSIS — M545 Low back pain, unspecified: Secondary | ICD-10-CM | POA: Diagnosis not present

## 2021-02-17 NOTE — ED Provider Notes (Signed)
[facility]    CSN: [id number] Arrival date & time: 02/12/21  1402      History   Chief Complaint Chief Complaint  Patient presents with  . Hip Pain    HPI Latoya Freeman is a 80 y.o. female.   Patient is a very pleasant 80 year old female who presents with her daughter for evaluation of the above issue.  She reports having some issues with the left side of her low back and left SI area that is chronic for the past 9 years.  She sees a Restaurant manager, fast food on a regular basis.  She says the chiropractor did help her initially but for the last 3 months her symptoms have progressed to the point where it is not really helping.  She now reports having numbness and tingling that goes from the left side of her low back into the buttock and down her left leg.  Seems to concentrate in the upper thigh region anteriorly but can go down her entire leg and involve her great toe.  Symptoms of gotten so bad that she is now using a walker for the past 2 weeks.  Had to sit on her right side because she cannot sit on her left side.  She is having difficulty sleeping.  No incontinence of bowel or bladder.  She has been using Tylenol as she cannot use anti-inflammatories because she is on Plavix and Eliquis.  She also reports that she is on prednisone.  She goes to [facility] for ongoing medical care.  She denies any chest pain or shortness of breath.  No red flag signs or symptoms elicited on history.     Past Medical History:  Diagnosis Date  . Aneurysm ([place]) 1980  . Cancer ([place])   . GERD (gastroesophageal reflux disease)   . Hypertension 1980  . Hypothyroidism 1999  . Pneumonia   . Pulmonary embolism ([place])   . Shingles   . Wears dentures    full upper and lower    Patient Active Problem List   Diagnosis Date Noted  . Iron deficiency anemia   . Polyp of colon   . Columnar-lined esophagus   . Abdominal pain 08/05/2020  . Celiac artery stenosis ([place]) 08/05/2020  . Mesenteric  ischemia ([place]) 07/17/2020  . History of pulmonary embolism 04/29/2020  . Anemia in chronic kidney disease 04/23/2020  . Secondary hyperparathyroidism of renal origin ([place]) 04/23/2020  . Urinary tract infection 04/23/2020  . Benign breast lumps 02/23/2020  . Brain aneurysm 02/23/2020  . Hemorrhoids 02/23/2020  . Left wrist tendonitis 02/23/2020  . Menopausal symptoms 02/23/2020  . Renal mass 02/23/2020  . Hypercalcemia 12/18/2019  . Hypertensive chronic kidney disease with stage 1 through stage 4 chronic kidney disease, or unspecified chronic kidney disease 12/18/2019  . Hypokalemia 12/18/2019  . Stage 3b chronic kidney disease ([place]) 12/18/2019  . Cyst of left ovary 04/04/2019  . Postmenopausal bleeding 04/04/2019  . Endometrial thickening on ultrasound 04/04/2019  . Pulmonary embolism ([place])   . GERD (gastroesophageal reflux disease) 08/07/2018  . Hypertension 08/07/2018  . Shingles 08/07/2018  . Thyroid disease 08/07/2018  . HCAP (healthcare-associated pneumonia) 07/27/2018  . Obesity (BMI 30-39.9) 06/21/2018    Past Surgical History:  Procedure Laterality Date  . BRAIN SURGERY     aneurysm  . BREAST CYST ASPIRATION Left 2005  . BREAST EXCISIONAL BIOPSY Left 2013   atypical ductal hyperplasia  . BREAST SURGERY  2013   LF Breast Wide EXC   . CHOLECYSTECTOMY  2004  . COLONOSCOPY    . COLONOSCOPY WITH PROPOFOL N/A 01/31/2018   Procedure: COLONOSCOPY WITH PROPOFOL;  Surgeon: Lollie Sails, MD;  Location[facility] ENDOSCOPY;  Service: Endoscopy;  Laterality: N/A;  . COLONOSCOPY WITH PROPOFOL N/A 08/06/2020   Procedure: COLONOSCOPY WITH PROPOFOL;  Surgeon: Virgel Manifold, MD;  Location: [facility] ENDOSCOPY;  Service: Endoscopy;  Laterality: N/A;  . DILATION AND CURETTAGE OF UTERUS N/A 05/03/2019   Procedure: DILATATION AND CURETTAGE;  Surgeon: Gae Dry, MD;  Location: [facility] ORS;  Service: Gynecology;  Laterality: N/A;  . ESOPHAGOGASTRODUODENOSCOPY N/A 01/31/2018    Procedure: ESOPHAGOGASTRODUODENOSCOPY (EGD);  Surgeon: Lollie Sails, MD;  Location: [facility] ENDOSCOPY;  Service: Endoscopy;  Laterality: N/A;  . ESOPHAGOGASTRODUODENOSCOPY (EGD) WITH PROPOFOL N/A 08/06/2020   Procedure: ESOPHAGOGASTRODUODENOSCOPY (EGD) WITH PROPOFOL;  Surgeon: Virgel Manifold, MD;  Location: [facility] ENDOSCOPY;  Service: Endoscopy;  Laterality: N/A;  . SHOULDER ARTHROSCOPY Left 07/25/2018   Procedure: SHOULDER MINI OPEN ROTATOR CUFF REPAIR  BICEPS TENDOSIS ARTHROSCOPIC DISTAL CLAVICLE EXCISION  SUBACROMIAL DECOMP;  Surgeon: Leim Fabry, MD;  Location: [place];  Service: Orthopedics;  Laterality: Left;  Astronomer WITH SPYDER SMITH & NEPHEW HEAD COIL ANCHOR FOOTPRINT ANCHOR QFIX ANCHOR  . VISCERAL ANGIOGRAPHY N/A 08/14/2020   Procedure: VISCERAL ANGIOGRAPHY;  Surgeon: Algernon Huxley, MD;  Location: [place];  Service: Cardiovascular;  Laterality: N/A;    OB History    Gravida  3   Para      Term      Preterm      AB      Living  3     SAB      IAB      Ectopic      Multiple      Live Births               Home Medications    Prior to Admission medications   Medication Sig Start Date End Date Taking? Authorizing Provider  acetaminophen (TYLENOL) 500 MG tablet Take 1 tablet by mouth every 6 (six) hours as needed.    Yes [provider]  amLODipine (NORVASC) 5 MG tablet Take 5 mg by mouth daily.  02/07/13  Yes [provider]  citalopram (CELEXA) 20 MG tablet Take 20 mg by mouth daily. 04/23/19  Yes [provider]  ELIQUIS 5 MG TABS tablet TAKE 1 TABLET TWICE DAILY 12/03/20  Yes Sindy Guadeloupe, MD  levothyroxine (SYNTHROID, LEVOTHROID) 75 MCG tablet Take 75 mcg by mouth daily.   Yes [provider]  losartan (COZAAR) 50 MG tablet Take 50 mg by mouth daily.    Yes [provider]  omeprazole (PRILOSEC) 20 MG capsule Take 20 mg by mouth daily. 07/10/20  Yes [provider]   traMADol (ULTRAM) 50 MG tablet Take 1 tablet (50 mg total) by mouth every 6 (six) hours as needed. 02/12/21  Yes Verda Cumins, MD  triamterene-hydrochlorothiazide (MAXZIDE-25) 37.5-25 MG tablet Take 1 tablet by mouth daily. 07/10/20  Yes [provider]  linaclotide Rolan Lipa) 145 MCG CAPS capsule Take 1 capsule (145 mcg total) by mouth daily before breakfast. Patient not taking: Reported on 09/22/2020 07/07/20 09/22/20  Virgel Manifold, MD  Na Sulfate-K Sulfate-Mg Sulf 17.5-3.13-1.6 GM/177ML SOLN At 5 PM the day before procedure take 1 bottle and 5 hours before procedure take 1 bottle. 07/29/20   Virgel Manifold, MD  potassium chloride (KLOR-CON) 10 MEQ tablet Take 10 mEq by mouth 2 (two)  times daily. 06/17/20   [provider]    Family History Family History  Problem Relation Age of Onset  . Breast cancer Neg Hx     Social History Social History   Tobacco Use  . Smoking status: Former Smoker    Packs/day: 1.50    Years: 20.00    Pack years: 30.00    Quit date: 1990    Years since quitting: 32.2  . Smokeless tobacco: Never Used  Vaping Use  . Vaping Use: Never used  Substance Use Topics  . Alcohol use: No  . Drug use: No     Allergies   Bee venom, Shellfish allergy, Tamoxifen, and Penicillins   Review of Systems Review of Systems  Constitutional: Positive for activity change. Negative for appetite change, chills, diaphoresis, fatigue and fever.  HENT: Negative.   Eyes: Negative.   Respiratory: Negative.   Cardiovascular: Negative.   Gastrointestinal: Negative.   Genitourinary: Negative.   Musculoskeletal: Positive for back pain and gait problem. Negative for arthralgias, joint swelling, myalgias, neck pain and neck stiffness.  Skin: Negative for color change, pallor, rash and wound.  Neurological: Positive for numbness. Negative for dizziness, seizures, syncope, weakness, light-headedness and headaches.  All other systems reviewed and are  negative.    Physical Exam Triage Vital Signs ED Triage Vitals  Enc Vitals Group     BP 02/12/21 1421 118/65     Pulse Rate 02/12/21 1421 60     Resp 02/12/21 1421 18     Temp 02/12/21 1421 98.5 F (36.9 C)     Temp Source 02/12/21 1421 Oral     SpO2 02/12/21 1421 98 %     Weight 02/12/21 1418 174 lb (78.9 kg)     Height 02/12/21 1418 5' (1.524 m)     Head Circumference --      Peak Flow --      Pain Score 02/12/21 1418 10     Pain Loc --      Pain Edu? --      Excl. in [place]? --    No data found.  Updated Vital Signs BP 118/65 (BP Location: Right Arm)   Pulse 60   Temp 98.5 F (36.9 C) (Oral)   Resp 18   Ht 5' (1.524 m)   Wt 78.9 kg   SpO2 98%   BMI 33.98 kg/m   Visual Acuity Right Eye Distance:   Left Eye Distance:   Bilateral Distance:    Right Eye Near:   Left Eye Near:    Bilateral Near:     Physical Exam Vitals reviewed.  Constitutional:      General: She is not in acute distress.    Appearance: She is not ill-appearing, toxic-appearing or diaphoretic.     Comments: Uncomfortable appearing.  Sitting in the wheelchair.  Having difficulty transferring from the wheelchair to the examination table.  HENT:     Head: Normocephalic and atraumatic.  Cardiovascular:     Rate and Rhythm: Normal rate and regular rhythm.     Pulses: Normal pulses.     Heart sounds: Normal heart sounds. No murmur heard. No friction rub. No gallop.   Pulmonary:     Effort: Pulmonary effort is normal.     Breath sounds: Normal breath sounds.  Musculoskeletal:        General: No swelling, deformity or signs of injury.     Right lower leg: No edema.     Left lower leg: No edema.  Comments: Musculoskeletal examination: Very limited examination due to pain.  Unable to assess while the patient is standing.  Laying her on the exam table she has pain that radiates into the left groin with internal rotation.  With external rotation she has pain that goes into the buttock area.  She  does have a positive straight leg raise while sitting on the exam table.  She does have weakness in all planes but most pronounced with hip flexion which causes pain.  She has 3+ DTRs bilateral lower extremities at the Achilles tendon and patellar tendon.  There is no clonus noted.  Skin:    Capillary Refill: Capillary refill takes less than 2 seconds.  Neurological:     General: No focal deficit present.     Mental Status: She is alert and oriented to person, place, and time.     Cranial Nerves: No cranial nerve deficit.      UC Treatments / Results  Labs (all labs ordered are listed, but only abnormal results are displayed) Labs Reviewed - No data to display  EKG   Radiology No results found.  Procedures Procedures (including critical care time)  Medications Ordered in UC Medications - No data to display  Initial Impression / Assessment and Plan / UC Course  I have reviewed the triage vital signs and the nursing notes.  Pertinent labs & imaging results that were available during my care of the patient were reviewed by me and considered in my medical decision making (see chart for details).  Clinical impression: 1.  Left-sided low back pain with lumbar radiculitis. 2.  Left groin pain potential degenerative hip disease. 3.  Chronic left SI joint dysfunction  Treatment plan: 1.  The findings and treatment plan were discussed in detail with the patient and her daughter.  All parties were in agreement voiced verbal understanding. 2.  I did want to put her on prednisone but she says that she is already on it even though I cannot find it on her med list.  She will contact us to confirm that. 3.  She cannot go on an anti-inflammatory due to the Eliquis and Plavix.  She can use Tylenol. 4.  We will do a trial of tramadol as I do not want to put her on a narcotic per se. 5.  Educational handouts provided. 6.  You do have an appointment with orthopedics next week.  I would call  them today and see if they can move that up.  They can better assess whether the tramadol is helping a. 7.  In addition you may benefit from Neurontin at nighttime.  I will leave it to orthopedics to decide that. 8.  Did consider doing x-rays but given that there is no trauma and orthopedics would like to do their own films I will hold on that if there is no clinical utility for that today. 9.  You may benefit from physiatry in the future for some injections in the spine and maybe even into your hip intra-articularly. 10.  Advised if symptoms were to worsen in any way that she should go to the emergency room for higher level of care and potential scanning.      Final Clinical Impressions(s) / UC Diagnoses   Final diagnoses:  Left hip pain  Chronic left-sided low back pain with left-sided sciatica  Lumbar radiculitis  Numbness and tingling of left lower extremity  Left groin pain     Discharge Instructions     Your  examination is consistent with 2 issues. The first is chronic left-sided low back buttock and leg pain.  You have numbness and tingling going down your leg that I believe is coming from your back.  It is nerve root irritation, probably spinal stenosis. The second issue is left groin and hip pain.  You probably have either some form of pain from compensation or you may have some arthritis in your left hip. I would have like to put you on prednisone, but I am not comfortable because you are on Plavix and Eliquis.  Just use Tylenol.  I added in some tramadol to see if that helps.  You have a follow-up appointment with orthopedics next week.  I would call them and see if you could be seen earlier and when you see them you can discuss whether the tramadol is helping. As I said without trauma doing x-rays today would not be beneficial.  They are going to do their own films when you see orthopedics. Educational handouts were provided.  If your symptoms worsen in any way before you see  orthopedics please go to the emergency room for higher level of care and potential scanning.  I hope you begin to feeling better, Dr. Drema Dallas   ED Prescriptions    Medication Sig Dispense Auth. Provider   traMADol (ULTRAM) 50 MG tablet Take 1 tablet (50 mg total) by mouth every 6 (six) hours as needed. 30 tablet Verda Cumins, MD     I have reviewed the PDMP during this encounter.   Verda Cumins, MD 02/17/21 [phone]

## 2021-02-27 ENCOUNTER — Encounter: Payer: Self-pay | Admitting: Oncology

## 2021-02-27 ENCOUNTER — Inpatient Hospital Stay: Payer: Medicare HMO | Attending: Oncology | Admitting: Oncology

## 2021-02-27 VITALS — BP 130/70 | HR 58 | Temp 98.3°F | Resp 16 | Ht 60.0 in | Wt 184.0 lb

## 2021-02-27 DIAGNOSIS — D509 Iron deficiency anemia, unspecified: Secondary | ICD-10-CM

## 2021-02-27 DIAGNOSIS — D508 Other iron deficiency anemias: Secondary | ICD-10-CM | POA: Diagnosis not present

## 2021-02-27 DIAGNOSIS — Z86711 Personal history of pulmonary embolism: Secondary | ICD-10-CM

## 2021-02-27 NOTE — Progress Notes (Signed)
Hematology/Oncology Consult note [facility]  [phone] [phone]  Patient Care Team: [name], MD as PCP - General (Family Medicine) Sindy Guadeloupe, MD as Consulting Physician (Oncology)   Name of the patient: Latoya Freeman  [id number]  07-12-1941   Date of visit: 02/27/21  Diagnosis- 1.  History of unprovoked PE on Eliquis 2.  Anemia likely due to iron deficiency  Chief complaint/ Reason for visit-routine follow-up of being anemia  Heme/Onc history: Patient is a 80 year old female who was seen by me in June 2020.  She was found to have unprovoked PE in December 2019 and has been on Eliquis since then.  She has now been referred to me for anemia.  Most recent CBC from 07/20/2020 showed H&H of 9.7/29 with an MCV of 83 ferritin levels were low at 12 and iron saturation low at 14%.  Patient has been following up with Dr. 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  She was evaluated by vascular surgery at [facility] for possible intervention of this ligament and subsequently underwent surgery with resolution of symptoms   Interval history-patient currently reports ongoing fatigue.  She is on Plavix and Eliquis.  Denies any bleeding in stool or urine.  Denies any dark melanotic stools.  Left hip pain  ECOG PS- 1 Pain scale- 4   Review of systems- Review of Systems  Constitutional: Positive for malaise/fatigue. Negative for chills, fever and weight loss.  HENT: Negative for congestion, ear discharge and nosebleeds.   Eyes: Negative for blurred vision.  Respiratory: Negative for cough, hemoptysis, sputum production, shortness of breath and wheezing.   Cardiovascular: Negative for  chest pain, palpitations, orthopnea and claudication.  Gastrointestinal: Negative for abdominal pain, blood in stool, constipation, diarrhea, heartburn, melena, nausea and vomiting.  Genitourinary: Negative for dysuria, flank pain, frequency, hematuria and urgency.  Musculoskeletal: Negative for back pain, joint pain (Left hip pain) and myalgias.  Skin: Negative for rash.  Neurological: Negative for dizziness, tingling, focal weakness, seizures, weakness and headaches.  Endo/Heme/Allergies: Does not bruise/bleed easily.  Psychiatric/Behavioral: Negative for depression and suicidal ideas. The patient does not have insomnia.       Allergies  Allergen Reactions  . Bee Venom Swelling  . Shellfish Allergy Swelling  . Tamoxifen Hives  . Penicillins Hives, Rash and Other (See Comments)    Has patient had a PCN reaction causing immediate rash, facial/tongue/throat swelling, SOB or lightheadedness with hypotension: Unknown Has patient had a PCN reaction causing severe rash involving mucus membranes or skin necrosis: Unknown Has patient had a PCN reaction that required hospitalization: Unknown Has patient had a PCN reaction occurring within the last 10 years: No If all of the above answers are "NO", then may proceed with Cephalosporin use.      Past Medical History:  Diagnosis Date  . Aneurysm ([place]) 1980  . Cancer ([place])   . GERD (gastroesophageal reflux disease)   . Hypertension 1980  . Hypothyroidism 1999  . Pneumonia   . Pulmonary embolism ([place])   . Shingles   . Wears dentures    full upper and lower     Past Surgical History:  Procedure Laterality Date  . BRAIN SURGERY     aneurysm  . BREAST CYST ASPIRATION Left 2005  .  BREAST EXCISIONAL BIOPSY Left 2013   atypical ductal hyperplasia  . BREAST SURGERY  2013   LF Breast Wide EXC   . CHOLECYSTECTOMY  2004  . COLONOSCOPY    . COLONOSCOPY WITH PROPOFOL N/A 01/31/2018   Procedure: COLONOSCOPY WITH PROPOFOL;  Surgeon: Lollie Sails, MD;  Location: [facility] ENDOSCOPY;  Service: Endoscopy;  Laterality: N/A;  . COLONOSCOPY WITH PROPOFOL N/A 08/06/2020   Procedure: COLONOSCOPY WITH PROPOFOL;  Surgeon: Virgel Manifold, MD;  Location: [facility] ENDOSCOPY;  Service: Endoscopy;  Laterality: N/A;  . DILATION AND CURETTAGE OF UTERUS N/A 05/03/2019   Procedure: DILATATION AND CURETTAGE;  Surgeon: Gae Dry, MD;  Location: [facility] ORS;  Service: Gynecology;  Laterality: N/A;  . ESOPHAGOGASTRODUODENOSCOPY N/A 01/31/2018   Procedure: ESOPHAGOGASTRODUODENOSCOPY (EGD);  Surgeon: Lollie Sails, MD;  Location: [facility] ENDOSCOPY;  Service: Endoscopy;  Laterality: N/A;  . ESOPHAGOGASTRODUODENOSCOPY (EGD) WITH PROPOFOL N/A 08/06/2020   Procedure: ESOPHAGOGASTRODUODENOSCOPY (EGD) WITH PROPOFOL;  Surgeon: Virgel Manifold, MD;  Location: [facility] ENDOSCOPY;  Service: Endoscopy;  Laterality: N/A;  . SHOULDER ARTHROSCOPY Left 07/25/2018   Procedure: SHOULDER MINI OPEN ROTATOR CUFF REPAIR  BICEPS TENDOSIS ARTHROSCOPIC DISTAL CLAVICLE EXCISION  SUBACROMIAL DECOMP;  Surgeon: Leim Fabry, MD;  Location: [facility];  Service: Orthopedics;  Laterality: Left;  Astronomer WITH SPYDER SMITH & NEPHEW HEAD COIL ANCHOR FOOTPRINT ANCHOR QFIX ANCHOR  . VISCERAL ANGIOGRAPHY N/A 08/14/2020   Procedure: VISCERAL ANGIOGRAPHY;  Surgeon: Algernon Huxley, MD;  Location: [facility] CV [facility];  Service: Cardiovascular;  Laterality: N/A;    Social History   Socioeconomic History  . Marital status: Divorced    Spouse name: Not on file  . Number of children: Not on file  . Years of education: Not on file  . Highest education level: Not on file  Occupational History  . Not on file  Tobacco Use  . Smoking status: Former Smoker    Packs/day: 1.50    Years: 20.00    Pack years: 30.00    Quit date: 1990    Years since quitting: 32.2  . Smokeless tobacco: Never Used  Vaping Use  . Vaping Use: Never used  Substance and Sexual Activity  . Alcohol use:  No  . Drug use: No  . Sexual activity: Not on file  Other Topics Concern  . Not on file  Social History Narrative  . Not on file   Social Determinants of Health   Financial Resource Strain: Not on file  Food Insecurity: Not on file  Transportation Needs: Not on file  Physical Activity: Not on file  Stress: Not on file  Social Connections: Not on file  Intimate Partner Violence: Not on file    Family History  Problem Relation Age of Onset  . Breast cancer Neg Hx      Current Outpatient Medications:  .  acetaminophen (TYLENOL) 500 MG tablet, Take 1 tablet by mouth every 6 (six) hours as needed. , Disp: , Rfl:  .  amLODipine (NORVASC) 5 MG tablet, Take 5 mg by mouth daily. , Disp: , Rfl:  .  citalopram (CELEXA) 20 MG tablet, Take 20 mg by mouth daily., Disp: , Rfl:  .  ELIQUIS 5 MG TABS tablet, TAKE 1 TABLET TWICE DAILY, Disp: 180 tablet, Rfl: 3 .  levothyroxine (SYNTHROID, LEVOTHROID) 75 MCG tablet, Take 75 mcg by mouth daily., Disp: , Rfl:  .  losartan (COZAAR) 50 MG tablet, Take 50 mg by mouth daily. , Disp: , Rfl:  .  omeprazole (PRILOSEC) 20 MG capsule, Take 20 mg by mouth daily., Disp: , Rfl:  .  triamterene-hydrochlorothiazide (MAXZIDE-25) 37.5-25 MG tablet, Take 1 tablet by mouth daily., Disp: , Rfl:   Physical exam: There were no vitals filed for this visit. Physical Exam Cardiovascular:     Rate and Rhythm: Normal rate and regular rhythm.     Heart sounds: Normal heart sounds.  Pulmonary:     Effort: Pulmonary effort is normal.     Breath sounds: Normal breath sounds.  Abdominal:     General: Bowel sounds are normal.     Palpations: Abdomen is soft.  Skin:    General: Skin is warm and dry.  Neurological:     Mental Status: She is alert and oriented to person, place, and time.      CMP Latest Ref Rng & Units 08/22/2020  Glucose 70 - 99 mg/dL 103(H)  BUN 8 - 23 mg/dL 17  Creatinine 0.44 - 1.00 mg/dL 1.50(H)  Sodium 135 - 145 mmol/L 138  Potassium 3.5 -  5.1 mmol/L 3.4(L)  Chloride 98 - 111 mmol/L 101  CO2 22 - 32 mmol/L 25  Calcium 8.9 - 10.3 mg/dL 10.2  Total Protein 6.5 - 8.1 g/dL 7.9  Total Bilirubin 0.3 - 1.2 mg/dL 0.6  Alkaline Phos 38 - 126 U/L 52  AST 15 - 41 U/L 20  ALT 0 - 44 U/L 19   CBC Latest Ref Rng & Units 01/30/2021  WBC 4.0 - 10.5 K/uL 6.8  Hemoglobin 12.0 - 15.0 g/dL 11.7(L)  Hematocrit 36.0 - 46.0 % 34.7(L)  Platelets 150 - 400 K/uL 260      Assessment and plan- Patient is a 80 y.o. female with history of unprovoked PE on Eliquis and iron deficiency anemia here for routine follow-up  Iron deficiency anemia: Patient's hemoglobin improved to 13.5 after she had received IV iron back in October 2021.  Her hemoglobin is back down to 11.7 with a ferritin of 11.  Given the ongoing fatigue it would be okay to proceed with IV iron at this time.  Based on her insurance she will need 5 doses of Venna for which she will get over 3 weeks.  Repeat CBC ferritin and iron studies in 3 in 6 months and I will see her back in 6 months  History of unprovoked PE: Continue Eliquis   Visit Diagnosis 1. Iron deficiency anemia, unspecified iron deficiency anemia type   2. History of pulmonary embolism      Dr. Randa Evens, MD, MPH [facility] [phone] 02/27/2021 1:21 PM

## 2021-02-27 NOTE — Progress Notes (Signed)
Pt here to get lab results. Pt states she is blah- she states that she has not bounced back from her surgery in jan.

## 2021-03-01 ENCOUNTER — Other Ambulatory Visit: Payer: Self-pay | Admitting: *Deleted

## 2021-03-01 DIAGNOSIS — D509 Iron deficiency anemia, unspecified: Secondary | ICD-10-CM

## 2021-03-05 ENCOUNTER — Other Ambulatory Visit: Payer: Self-pay

## 2021-03-05 ENCOUNTER — Inpatient Hospital Stay: Payer: Medicare HMO

## 2021-03-05 VITALS — BP 113/64 | HR 63 | Temp 98.6°F | Resp 16

## 2021-03-05 DIAGNOSIS — D508 Other iron deficiency anemias: Secondary | ICD-10-CM

## 2021-03-05 MED ORDER — SODIUM CHLORIDE 0.9 % IV SOLN
Freq: Once | INTRAVENOUS | Status: AC
  Filled 2021-03-05: qty 250

## 2021-03-05 MED ORDER — IRON SUCROSE 20 MG/ML IV SOLN
200.0000 mg | Freq: Once | INTRAVENOUS | Status: AC
  Administered 2021-03-05: 200 mg via INTRAVENOUS
  Filled 2021-03-05: qty 10

## 2021-03-05 MED ORDER — SODIUM CHLORIDE 0.9 % IV SOLN: 200.0000 mg | INTRAVENOUS | Status: DC

## 2021-03-09 ENCOUNTER — Encounter: Payer: Self-pay | Admitting: Oncology

## 2021-03-09 ENCOUNTER — Other Ambulatory Visit: Payer: Self-pay | Admitting: Family Medicine

## 2021-03-09 ENCOUNTER — Inpatient Hospital Stay: Payer: Medicare HMO

## 2021-03-09 VITALS — BP 125/76 | HR 61 | Temp 98.4°F | Resp 18

## 2021-03-09 DIAGNOSIS — M5416 Radiculopathy, lumbar region: Secondary | ICD-10-CM

## 2021-03-09 DIAGNOSIS — D508 Other iron deficiency anemias: Secondary | ICD-10-CM | POA: Diagnosis not present

## 2021-03-09 DIAGNOSIS — M5442 Lumbago with sciatica, left side: Secondary | ICD-10-CM | POA: Diagnosis not present

## 2021-03-09 DIAGNOSIS — M5136 Other intervertebral disc degeneration, lumbar region: Secondary | ICD-10-CM | POA: Diagnosis not present

## 2021-03-09 DIAGNOSIS — G8929 Other chronic pain: Secondary | ICD-10-CM | POA: Diagnosis not present

## 2021-03-09 MED ORDER — SODIUM CHLORIDE 0.9 % IV SOLN
Freq: Once | INTRAVENOUS | Status: AC
  Filled 2021-03-09: qty 250

## 2021-03-09 MED ORDER — SODIUM CHLORIDE 0.9 % IV SOLN: 200.0000 mg | INTRAVENOUS | Status: DC

## 2021-03-09 MED ORDER — IRON SUCROSE 20 MG/ML IV SOLN
200.0000 mg | Freq: Once | INTRAVENOUS | Status: AC
Start: 2021-03-09 — End: 2021-03-09
  Administered 2021-03-09: 200 mg via INTRAVENOUS
  Filled 2021-03-09: qty 10

## 2021-03-10 ENCOUNTER — Ambulatory Visit
Admission: RE | Admit: 2021-03-10 | Discharge: 2021-03-10 | Disposition: A | Discharge status: Home / Self Care | Payer: Medicare HMO | Source: Ambulatory Visit | Attending: Family Medicine | Admitting: Family Medicine

## 2021-03-10 ENCOUNTER — Other Ambulatory Visit: Payer: Self-pay

## 2021-03-10 DIAGNOSIS — M545 Low back pain, unspecified: Secondary | ICD-10-CM | POA: Diagnosis not present

## 2021-03-10 DIAGNOSIS — M5416 Radiculopathy, lumbar region: Secondary | ICD-10-CM | POA: Diagnosis not present

## 2021-03-11 ENCOUNTER — Inpatient Hospital Stay: Payer: Medicare HMO

## 2021-03-12 DIAGNOSIS — M5136 Other intervertebral disc degeneration, lumbar region: Secondary | ICD-10-CM | POA: Diagnosis not present

## 2021-03-12 DIAGNOSIS — M48062 Spinal stenosis, lumbar region with neurogenic claudication: Secondary | ICD-10-CM | POA: Diagnosis not present

## 2021-03-12 DIAGNOSIS — M5416 Radiculopathy, lumbar region: Secondary | ICD-10-CM | POA: Diagnosis not present

## 2021-03-13 ENCOUNTER — Other Ambulatory Visit: Payer: Self-pay

## 2021-03-13 ENCOUNTER — Inpatient Hospital Stay: Payer: Medicare HMO

## 2021-03-13 VITALS — BP 116/65 | HR 64 | Resp 18

## 2021-03-13 DIAGNOSIS — D508 Other iron deficiency anemias: Secondary | ICD-10-CM | POA: Diagnosis not present

## 2021-03-13 MED ORDER — SODIUM CHLORIDE 0.9 % IV SOLN
Freq: Once | INTRAVENOUS | Status: AC
Start: 2021-03-13 — End: 2021-03-13
  Filled 2021-03-13: qty 250

## 2021-03-13 MED ORDER — SODIUM CHLORIDE 0.9 % IV SOLN: 200.0000 mg | INTRAVENOUS | Status: DC

## 2021-03-13 MED ORDER — IRON SUCROSE 20 MG/ML IV SOLN
200.0000 mg | Freq: Once | INTRAVENOUS | Status: AC
Start: 2021-03-13 — End: 2021-03-13
  Administered 2021-03-13: 200 mg via INTRAVENOUS
  Filled 2021-03-13: qty 10

## 2021-03-16 ENCOUNTER — Inpatient Hospital Stay: Payer: Medicare HMO

## 2021-03-16 VITALS — BP 129/80 | HR 60 | Temp 98.4°F | Resp 18

## 2021-03-16 DIAGNOSIS — D508 Other iron deficiency anemias: Secondary | ICD-10-CM | POA: Diagnosis not present

## 2021-03-16 MED ORDER — SODIUM CHLORIDE 0.9 % IV SOLN: 200.0000 mg | INTRAVENOUS | Status: DC

## 2021-03-16 MED ORDER — SODIUM CHLORIDE 0.9 % IV SOLN
Freq: Once | INTRAVENOUS | Status: AC
  Filled 2021-03-16: qty 250

## 2021-03-16 MED ORDER — IRON SUCROSE 20 MG/ML IV SOLN
200.0000 mg | Freq: Once | INTRAVENOUS | Status: AC
  Administered 2021-03-16: 200 mg via INTRAVENOUS
  Filled 2021-03-16: qty 10

## 2021-03-16 NOTE — Patient Instructions (Signed)

## 2021-03-18 ENCOUNTER — Inpatient Hospital Stay: Payer: Medicare HMO

## 2021-03-18 ENCOUNTER — Other Ambulatory Visit: Payer: Self-pay

## 2021-03-18 VITALS — BP 141/69 | HR 66 | Temp 96.0°F

## 2021-03-18 DIAGNOSIS — D508 Other iron deficiency anemias: Secondary | ICD-10-CM | POA: Diagnosis not present

## 2021-03-18 MED ORDER — IRON SUCROSE 20 MG/ML IV SOLN
200.0000 mg | Freq: Once | INTRAVENOUS | Status: AC
  Administered 2021-03-18: 200 mg via INTRAVENOUS
  Filled 2021-03-18: qty 10

## 2021-03-18 MED ORDER — SODIUM CHLORIDE 0.9 % IV SOLN: 200.0000 mg | INTRAVENOUS | Status: DC

## 2021-03-18 MED ORDER — SODIUM CHLORIDE 0.9 % IV SOLN
Freq: Once | INTRAVENOUS | Status: AC
  Filled 2021-03-18: qty 250

## 2021-03-18 NOTE — Patient Instructions (Signed)
[facility]COL[facility]  Discharge Instructions: Thank you for choosing [facility] to provide your oncology and hematology care.  If you have a lab appointment with the [facility], please go directly to the [facility] and check in at the registration area.  Wear comfortable clothing and clothing appropriate for easy access to any Portacath or PICC line.   We strive to give you quality time with your provider. You may need to reschedule your appointment if you arrive late (15 or more minutes).  Arriving late affects you and other patients whose appointments are after yours.  Also, if you miss three or more appointments without notifying the office, you may be dismissed from the clinic at the provider's discretion.      For prescription refill requests, have your pharmacy contact our office and allow 72 hours for refills to be completed.    Today you received the following medication: Venofer   To help prevent nausea and vomiting after your treatment, we encourage you to take your nausea medication as directed.  BELOW ARE SYMPTOMS THAT SHOULD BE REPORTED IMMEDIATELY: . *FEVER GREATER THAN 100.4 F (38 C) OR HIGHER . *CHILLS OR SWEATING . *NAUSEA AND VOMITING THAT IS NOT CONTROLLED WITH YOUR NAUSEA MEDICATION . *UNUSUAL SHORTNESS OF BREATH . *UNUSUAL BRUISING OR BLEEDING . *URINARY PROBLEMS (pain or burning when urinating, or frequent urination) . *BOWEL PROBLEMS (unusual diarrhea, constipation, pain near the anus) . TENDERNESS IN MOUTH AND THROAT WITH OR WITHOUT PRESENCE OF ULCERS (sore throat, sores in mouth, or a toothache) . UNUSUAL RASH, SWELLING OR PAIN  . UNUSUAL VAGINAL DISCHARGE OR ITCHING   Items with * indicate a potential emergency and should be followed up as soon as possible or go to the Emergency Department if any problems should occur.  Please show the CHEMOTHERAPY ALERT CARD or IMMUNOTHERAPY ALERT CARD at check-in to the  Emergency Department and triage nurse.  Should you have questions after your visit or need to cancel or reschedule your appointment, please contact [facility]  [phone] and follow the prompts.  Office hours are 8:00 a.m. to 4:30 p.m. Monday - Friday. Please note that voicemails left after 4:00 p.m. may not be returned until the following business day.  We are closed weekends and major holidays. You have access to a nurse at all times for urgent questions. Please call the main number to the clinic [phone] and follow the prompts.  For any non-urgent questions, you may also contact your provider using MyChart. We now offer e-Visits for anyone 1 and older to request care online for non-urgent symptoms. For details visit [email or web address].   Also download the MyChart app! Go to the app store, search "MyChart", open the app, select [facility], and log in with your MyChart username and password.  Due to Covid, a mask is required upon entering the hospital/clinic. If you do not have a mask, one will be given to you upon arrival. For doctor visits, patients may have 1 support person aged 76 or older with them. For treatment visits, patients cannot have anyone with them due to current Covid guidelines and our immunocompromised population. Iron Sucrose injection What is this medicine? IRON SUCROSE (AHY ern SOO krohs) is an iron complex. Iron is used to make healthy red blood cells, which carry oxygen and nutrients throughout the body. This medicine is used to treat iron deficiency anemia in people with chronic kidney disease. This medicine may be  used for other purposes; ask your health care provider or pharmacist if you have questions. COMMON BRAND NAME(S): Venofer What should I tell my health care provider before I take this medicine? They need to know if you have any of these conditions:  anemia not caused by low iron levels  heart disease  high  levels of iron in the blood  kidney disease  liver disease  an unusual or allergic reaction to iron, other medicines, foods, dyes, or preservatives  pregnant or trying to get pregnant  breast-feeding How should I use this medicine? This medicine is for infusion into a vein. It is given by a health care professional in a hospital or clinic setting. Talk to your pediatrician regarding the use of this medicine in children. While this drug may be prescribed for children as young as 2 years for selected conditions, precautions do apply. Overdosage: If you think you have taken too much of this medicine contact a poison control center or emergency room at once. NOTE: This medicine is only for you. Do not share this medicine with others. What if I miss a dose? It is important not to miss your dose. Call your doctor or health care professional if you are unable to keep an appointment. What may interact with this medicine? Do not take this medicine with any of the following medications:  deferoxamine  dimercaprol  other iron products This medicine may also interact with the following medications:  chloramphenicol  deferasirox This list may not describe all possible interactions. Give your health care provider a list of all the medicines, herbs, non-prescription drugs, or dietary supplements you use. Also tell them if you smoke, drink alcohol, or use illegal drugs. Some items may interact with your medicine. What should I watch for while using this medicine? Visit your doctor or healthcare professional regularly. Tell your doctor or healthcare professional if your symptoms do not start to get better or if they get worse. You may need blood work done while you are taking this medicine. You may need to follow a special diet. Talk to your doctor. Foods that contain iron include: whole grains/cereals, dried fruits, beans, or peas, leafy green vegetables, and organ meats (liver, kidney). What side  effects may I notice from receiving this medicine? Side effects that you should report to your doctor or health care professional as soon as possible:  allergic reactions like skin rash, itching or hives, swelling of the face, lips, or tongue  breathing problems  changes in blood pressure  cough  fast, irregular heartbeat  feeling faint or lightheaded, falls  fever or chills  flushing, sweating, or hot feelings  joint or muscle aches/pains  seizures  swelling of the ankles or feet  unusually weak or tired Side effects that usually do not require medical attention (report to your doctor or health care professional if they continue or are bothersome):  diarrhea  feeling achy  headache  irritation at site where injected  nausea, vomiting  stomach upset  tiredness This list may not describe all possible side effects. Call your doctor for medical advice about side effects. You may report side effects to FDA at 1-800-FDA-1088. Where should I keep my medicine? This drug is given in a hospital or clinic and will not be stored at home. NOTE: This sheet is a summary. It may not cover all possible information. If you have questions about this medicine, talk to your doctor, pharmacist, or health care provider.  2021 Elsevier/Gold Standard (2011-08-19  17:14:35)  

## 2021-03-25 ENCOUNTER — Ambulatory Visit: Payer: Medicare HMO

## 2021-03-27 DIAGNOSIS — K551 Chronic vascular disorders of intestine: Secondary | ICD-10-CM | POA: Diagnosis not present

## 2021-03-27 DIAGNOSIS — I774 Celiac artery compression syndrome: Secondary | ICD-10-CM | POA: Diagnosis not present

## 2021-04-01 DIAGNOSIS — M5126 Other intervertebral disc displacement, lumbar region: Secondary | ICD-10-CM | POA: Diagnosis not present

## 2021-04-01 DIAGNOSIS — Z79899 Other long term (current) drug therapy: Secondary | ICD-10-CM | POA: Diagnosis not present

## 2021-04-01 DIAGNOSIS — M5136 Other intervertebral disc degeneration, lumbar region: Secondary | ICD-10-CM | POA: Diagnosis not present

## 2021-04-01 DIAGNOSIS — M5416 Radiculopathy, lumbar region: Secondary | ICD-10-CM | POA: Diagnosis not present

## 2021-04-01 DIAGNOSIS — M48062 Spinal stenosis, lumbar region with neurogenic claudication: Secondary | ICD-10-CM | POA: Diagnosis not present

## 2021-04-13 DIAGNOSIS — G8929 Other chronic pain: Secondary | ICD-10-CM | POA: Diagnosis not present

## 2021-04-13 DIAGNOSIS — M5442 Lumbago with sciatica, left side: Secondary | ICD-10-CM | POA: Diagnosis not present

## 2021-04-23 DIAGNOSIS — G8929 Other chronic pain: Secondary | ICD-10-CM | POA: Diagnosis not present

## 2021-04-23 DIAGNOSIS — M5442 Lumbago with sciatica, left side: Secondary | ICD-10-CM | POA: Diagnosis not present

## 2021-05-01 DIAGNOSIS — I774 Celiac artery compression syndrome: Secondary | ICD-10-CM | POA: Diagnosis not present

## 2021-05-01 DIAGNOSIS — K551 Chronic vascular disorders of intestine: Secondary | ICD-10-CM | POA: Diagnosis not present

## 2021-05-07 DIAGNOSIS — M5416 Radiculopathy, lumbar region: Secondary | ICD-10-CM | POA: Diagnosis not present

## 2021-05-07 DIAGNOSIS — M4807 Spinal stenosis, lumbosacral region: Secondary | ICD-10-CM | POA: Diagnosis not present

## 2021-05-07 DIAGNOSIS — M4310 Spondylolisthesis, site unspecified: Secondary | ICD-10-CM | POA: Diagnosis not present

## 2021-05-18 ENCOUNTER — Other Ambulatory Visit: Payer: Self-pay | Admitting: Neurosurgery

## 2021-05-18 DIAGNOSIS — M4807 Spinal stenosis, lumbosacral region: Secondary | ICD-10-CM

## 2021-05-19 NOTE — Progress Notes (Signed)
Patient on schedule for myelogram 05/22/2021 called and spoke with patient's daughter on phone who relays info to mother with pre procedure instructions given. Holding eliquis LD after today,as well as Celexa, holding plavix since 6/25, liquids after MN prior to procedure, and NPO after 0430, driver post procedure/recovery/discharge, will be here post procedure 2-4 hours,stated understanding.

## 2021-05-20 ENCOUNTER — Ambulatory Visit: Payer: Medicare HMO

## 2021-05-22 ENCOUNTER — Ambulatory Visit
Admission: RE | Admit: 2021-05-22 | Discharge: 2021-05-22 | Disposition: A | Discharge status: Home / Self Care | Payer: Medicare HMO | Source: Ambulatory Visit | Attending: Neurosurgery | Admitting: Neurosurgery

## 2021-05-22 ENCOUNTER — Other Ambulatory Visit: Payer: Self-pay

## 2021-05-22 DIAGNOSIS — M4807 Spinal stenosis, lumbosacral region: Secondary | ICD-10-CM

## 2021-05-22 DIAGNOSIS — M5126 Other intervertebral disc displacement, lumbar region: Secondary | ICD-10-CM | POA: Diagnosis not present

## 2021-05-22 DIAGNOSIS — M48061 Spinal stenosis, lumbar region without neurogenic claudication: Secondary | ICD-10-CM | POA: Diagnosis not present

## 2021-05-22 MED ORDER — OXYCODONE HCL 5 MG PO TABS
5.0000 mg | ORAL_TABLET | Freq: Once | ORAL | Status: AC
Start: 2021-05-22 — End: 2021-05-22
  Administered 2021-05-22: 5 mg via ORAL
  Filled 2021-05-22: qty 1

## 2021-05-22 MED ORDER — OXYCODONE HCL 5 MG PO TABS
ORAL_TABLET | ORAL | Status: AC
  Filled 2021-05-22: qty 1

## 2021-05-22 MED ORDER — IOHEXOL 180 MG/ML  SOLN
20.0000 mL | Freq: Once | INTRAMUSCULAR | Status: AC | PRN
  Administered 2021-05-22: 16 mL via INTRATHECAL

## 2021-05-22 MED ORDER — LIDOCAINE HCL (PF) 1 % IJ SOLN
5.0000 mL | Freq: Once | INTRAMUSCULAR | Status: AC
  Administered 2021-05-22: 5 mL

## 2021-05-22 MED ORDER — ACETAMINOPHEN 325 MG PO TABS: 650.0000 mg | ORAL_TABLET | ORAL | Status: DC | PRN

## 2021-05-22 NOTE — Procedures (Signed)
Lumbar puncture was performed at L4-L5 for the purposes of a myelogram. A 22g spinal need was advanced until clear CSF return. Then, 16 mL of Omnipaque 180 was injected into the thecal sac.   The patient tolerated the procedure well and there were no immediate complications.

## 2021-05-29 ENCOUNTER — Inpatient Hospital Stay: Payer: Medicare HMO

## 2021-05-29 DIAGNOSIS — M1612 Unilateral primary osteoarthritis, left hip: Secondary | ICD-10-CM | POA: Diagnosis not present

## 2021-05-29 DIAGNOSIS — M7062 Trochanteric bursitis, left hip: Secondary | ICD-10-CM | POA: Diagnosis not present

## 2021-05-29 DIAGNOSIS — M5136 Other intervertebral disc degeneration, lumbar region: Secondary | ICD-10-CM | POA: Diagnosis not present

## 2021-05-29 DIAGNOSIS — M25552 Pain in left hip: Secondary | ICD-10-CM | POA: Diagnosis not present

## 2021-05-29 DIAGNOSIS — M4316 Spondylolisthesis, lumbar region: Secondary | ICD-10-CM | POA: Diagnosis not present

## 2021-05-29 DIAGNOSIS — M4807 Spinal stenosis, lumbosacral region: Secondary | ICD-10-CM | POA: Diagnosis not present

## 2021-05-29 DIAGNOSIS — M9953 Intervertebral disc stenosis of neural canal of lumbar region: Secondary | ICD-10-CM | POA: Diagnosis not present

## 2021-05-29 DIAGNOSIS — M5416 Radiculopathy, lumbar region: Secondary | ICD-10-CM | POA: Diagnosis not present

## 2021-05-29 DIAGNOSIS — M4310 Spondylolisthesis, site unspecified: Secondary | ICD-10-CM | POA: Diagnosis not present

## 2021-06-05 DIAGNOSIS — M9953 Intervertebral disc stenosis of neural canal of lumbar region: Secondary | ICD-10-CM | POA: Diagnosis not present

## 2021-06-05 DIAGNOSIS — M25552 Pain in left hip: Secondary | ICD-10-CM | POA: Diagnosis not present

## 2021-06-05 DIAGNOSIS — M1612 Unilateral primary osteoarthritis, left hip: Secondary | ICD-10-CM | POA: Diagnosis not present

## 2021-06-05 DIAGNOSIS — M5136 Other intervertebral disc degeneration, lumbar region: Secondary | ICD-10-CM | POA: Diagnosis not present

## 2021-06-05 DIAGNOSIS — M7062 Trochanteric bursitis, left hip: Secondary | ICD-10-CM | POA: Diagnosis not present

## 2021-06-05 DIAGNOSIS — M4316 Spondylolisthesis, lumbar region: Secondary | ICD-10-CM | POA: Diagnosis not present

## 2021-06-19 ENCOUNTER — Other Ambulatory Visit: Payer: Self-pay

## 2021-06-19 ENCOUNTER — Inpatient Hospital Stay: Payer: Medicare HMO | Attending: Oncology

## 2021-06-19 ENCOUNTER — Other Ambulatory Visit: Payer: Self-pay | Admitting: Neurosurgery

## 2021-06-19 ENCOUNTER — Other Ambulatory Visit: Payer: Self-pay | Admitting: *Deleted

## 2021-06-19 DIAGNOSIS — D509 Iron deficiency anemia, unspecified: Secondary | ICD-10-CM | POA: Diagnosis not present

## 2021-06-19 DIAGNOSIS — D508 Other iron deficiency anemias: Secondary | ICD-10-CM

## 2021-06-19 LAB — CBC
HCT: 39 % (ref 36.0–46.0)
Hemoglobin: 13.7 g/dL (ref 12.0–15.0)
MCH: 31.6 pg (ref 26.0–34.0)
MCHC: 35.1 g/dL (ref 30.0–36.0)
MCV: 89.9 fL (ref 80.0–100.0)
Platelets: 256 10*3/uL (ref 150–400)
RBC: 4.34 MIL/uL (ref 3.87–5.11)
RDW: 13 % (ref 11.5–15.5)
WBC: 6.7 10*3/uL (ref 4.0–10.5)
nRBC: 0 % (ref 0.0–0.2)

## 2021-06-19 LAB — COMPREHENSIVE METABOLIC PANEL
ALT: 14 U/L (ref 0–44)
AST: 23 U/L (ref 15–41)
Albumin: 4.1 g/dL (ref 3.5–5.0)
Alkaline Phosphatase: 60 U/L (ref 38–126)
Anion gap: 12 (ref 5–15)
BUN: 21 mg/dL (ref 8–23)
CO2: 26 mmol/L (ref 22–32)
Calcium: 10.4 mg/dL — ABNORMAL HIGH (ref 8.9–10.3)
Chloride: 99 mmol/L (ref 98–111)
Creatinine, Ser: 1.3 mg/dL — ABNORMAL HIGH (ref 0.44–1.00)
GFR, Estimated: 42 mL/min — ABNORMAL LOW (ref >60)
Glucose, Bld: 108 mg/dL — ABNORMAL HIGH (ref 70–99)
Potassium: 3.3 mmol/L — ABNORMAL LOW (ref 3.5–5.1)
Sodium: 137 mmol/L (ref 135–145)
Total Bilirubin: 0.4 mg/dL (ref 0.3–1.2)
Total Protein: 7.4 g/dL (ref 6.5–8.1)

## 2021-06-19 LAB — IRON AND TIBC
Iron: 110 ug/dL (ref 28–170)
Saturation Ratios: 33 % — ABNORMAL HIGH (ref 10.4–31.8)
TIBC: 335 ug/dL (ref 250–450)
UIBC: 225 ug/dL

## 2021-06-19 LAB — FERRITIN: Ferritin: 156 ng/mL (ref 11–307)

## 2021-06-22 ENCOUNTER — Other Ambulatory Visit: Payer: Self-pay | Admitting: *Deleted

## 2021-06-22 DIAGNOSIS — D508 Other iron deficiency anemias: Secondary | ICD-10-CM

## 2021-06-24 ENCOUNTER — Other Ambulatory Visit: Payer: Self-pay

## 2021-06-24 ENCOUNTER — Encounter
Admission: RE | Admit: 2021-06-24 | Discharge: 2021-06-24 | Disposition: A | Discharge status: Home / Self Care | Payer: Medicare HMO | Source: Ambulatory Visit | Attending: Neurosurgery | Admitting: Neurosurgery

## 2021-06-24 DIAGNOSIS — Z01818 Encounter for other preprocedural examination: Secondary | ICD-10-CM | POA: Insufficient documentation

## 2021-06-24 HISTORY — DX: Unspecified osteoarthritis, unspecified site: M19.90

## 2021-06-24 LAB — CBC
HCT: 38.4 % (ref 36.0–46.0)
Hemoglobin: 13.7 g/dL (ref 12.0–15.0)
MCH: 31.6 pg (ref 26.0–34.0)
MCHC: 35.7 g/dL (ref 30.0–36.0)
MCV: 88.7 fL (ref 80.0–100.0)
Platelets: 287 10*3/uL (ref 150–400)
RBC: 4.33 MIL/uL (ref 3.87–5.11)
RDW: 12.9 % (ref 11.5–15.5)
WBC: 7.2 10*3/uL (ref 4.0–10.5)
nRBC: 0 % (ref 0.0–0.2)

## 2021-06-24 LAB — BASIC METABOLIC PANEL
Anion gap: 7 (ref 5–15)
BUN: 15 mg/dL (ref 8–23)
CO2: 29 mmol/L (ref 22–32)
Calcium: 10.3 mg/dL (ref 8.9–10.3)
Chloride: 102 mmol/L (ref 98–111)
Creatinine, Ser: 1.25 mg/dL — ABNORMAL HIGH (ref 0.44–1.00)
GFR, Estimated: 44 mL/min — ABNORMAL LOW (ref >60)
Glucose, Bld: 92 mg/dL (ref 70–99)
Potassium: 3.6 mmol/L (ref 3.5–5.1)
Sodium: 138 mmol/L (ref 135–145)

## 2021-06-24 LAB — SURGICAL PCR SCREEN
MRSA, PCR: NEGATIVE
Staphylococcus aureus: NEGATIVE

## 2021-06-24 NOTE — Patient Instructions (Addendum)
Your procedure is scheduled on: 07/01/21  Report to the Registration Desk on the 1st floor of the [facility]. To find out your arrival time, please call [phone] between 1PM - 3PM on: 06/30/21 Report [facility] for your Covid Test on 06/29/21 from 8 a to 1200.  REMEMBER: Instructions that are not followed completely may result in serious medical risk, up to and including death; or upon the discretion of your surgeon and anesthesiologist your surgery may need to be rescheduled.  Do not eat food after midnight the night before surgery.  No gum chewing, lozengers or hard candies.  You may however, drink CLEAR liquids up to 2 hours before you are scheduled to arrive for your surgery. Do not drink anything within 2 hours of your scheduled arrival time.  Clear liquids include: - water  - apple juice without pulp - gatorade (not RED, PURPLE, OR BLUE) - black coffee or tea (Do NOT add milk or creamers to the coffee or tea) Do NOT drink anything that is not on this list.  TAKE THESE MEDICATIONS THE MORNING OF SURGERY WITH A SIP OF WATER:  - amLODipine (NORVASC) 5 MG tablet - citalopram (CELEXA) 20 MG tablet - levothyroxine (SYNTHROID, LEVOTHROID) 75 MCG tablet - omeprazole (PRILOSEC) 20 MG capsule (take one the night before and one on the morning of surgery - helps to prevent nausea after surgery.)  Follow recommendations from Cardiologist, Pulmonologist or PCP regarding stopping Aspirin, Coumadin, Plavix, Eliquis, Pradaxa, or Pletal.  Stop taking Eliquis beginning 06/28/21, may resume taking 07/16/21,  Stop taking Plavix beginning 06/23/21, may resume taking 07/16/21.  One week prior to surgery: Stop Anti-inflammatories (NSAIDS) such as Advil, Aleve, Ibuprofen, Motrin, Naproxen, Naprosyn and Aspirin based products such as Excedrin, Goodys Powder, BC Powder.  Stop ANY OVER THE COUNTER supplements until after surgery.  You may however, continue to take Tylenol if needed for pain  up until the day of surgery.  No Alcohol for 24 hours before or after surgery.  No Smoking including e-cigarettes for 24 hours prior to surgery.  No chewable tobacco products for at least 6 hours prior to surgery.  No nicotine patches on the day of surgery.  Do not use any "recreational" drugs for at least a week prior to your surgery.  Please be advised that the combination of cocaine and anesthesia may have negative outcomes, up to and including death. If you test positive for cocaine, your surgery will be cancelled.  On the morning of surgery brush your teeth with toothpaste and water, you may rinse your mouth with mouthwash if you wish. Do not swallow any toothpaste or mouthwash.  Do not wear jewelry, make-up, hairpins, clips or nail polish.  Do not wear lotions, powders, or perfumes.   Do not shave body from the neck down 48 hours prior to surgery just in case you cut yourself which could leave a site for infection.  Also, freshly shaved skin may become irritated if using the CHG soap.  Contact lenses, hearing aids and dentures may not be worn into surgery.  Do not bring valuables to the hospital. [facility] is not responsible for any missing/lost belongings or valuables.   Use CHG Soap or wipes as directed on instruction sheet.  Notify your doctor if there is any change in your medical condition (cold, fever, infection).  Wear comfortable clothing (specific to your surgery type) to the hospital.  After surgery, you can help prevent lung complications by doing breathing exercises.  Take  deep breaths and cough every 1-2 hours. Your doctor may order a device called an Incentive Spirometer to help you take deep breaths. When coughing or sneezing, hold a pillow firmly against your incision with both hands. This is called "splinting." Doing this helps protect your incision. It also decreases belly discomfort.  If you are being admitted to the hospital overnight, leave your  suitcase in the car. After surgery it may be brought to your room.  If you are being discharged the day of surgery, you will not be allowed to drive home. You will need a responsible adult (18 years or older) to drive you home and stay with you that night.   If you are taking public transportation, you will need to have a responsible adult (18 years or older) with you. Please confirm with your physician that it is acceptable to use public transportation.   Please call the [facility]. at [phone] if you have any questions about these instructions.  Surgery Visitation Policy:  Patients undergoing a surgery or procedure may have one family member or support person with them as long as that person is not COVID-19 positive or experiencing its symptoms.  That person may remain in the waiting area during the procedure.  Inpatient Visitation:    Visiting hours are 7 a.m. to 8 p.m. Inpatients will be allowed two visitors daily. The visitors may change each day during the patient's stay. No visitors under the age of 106. Any visitor under the age of 28 must be accompanied by an adult. The visitor must pass COVID-19 screenings, use hand sanitizer when entering and exiting the patient's room and wear a mask at all times, including in the patient's room. Patients must also wear a mask when staff or their visitor are in the room. Masking is required regardless of vaccination status.

## 2021-06-29 ENCOUNTER — Other Ambulatory Visit
Admission: RE | Admit: 2021-06-29 | Discharge: 2021-06-29 | Disposition: A | Discharge status: Home / Self Care | Payer: Medicare HMO | Source: Ambulatory Visit | Attending: Neurosurgery | Admitting: Neurosurgery

## 2021-06-29 ENCOUNTER — Other Ambulatory Visit: Payer: Self-pay

## 2021-06-29 DIAGNOSIS — Z01812 Encounter for preprocedural laboratory examination: Secondary | ICD-10-CM | POA: Diagnosis not present

## 2021-06-29 DIAGNOSIS — Z20822 Contact with and (suspected) exposure to covid-19: Secondary | ICD-10-CM | POA: Insufficient documentation

## 2021-06-29 LAB — SARS CORONAVIRUS 2 (TAT 6-24 HRS): SARS Coronavirus 2: NEGATIVE

## 2021-07-01 ENCOUNTER — Encounter
Admission: RE | Disposition: A | Discharge status: Home / Self Care | Payer: Self-pay | Source: Home / Self Care | Attending: Neurosurgery

## 2021-07-01 ENCOUNTER — Ambulatory Visit: Payer: Medicare HMO

## 2021-07-01 ENCOUNTER — Other Ambulatory Visit: Payer: Self-pay

## 2021-07-01 ENCOUNTER — Ambulatory Visit: Payer: Medicare HMO | Admitting: Anesthesiology

## 2021-07-01 ENCOUNTER — Encounter: Payer: Self-pay | Admitting: Neurosurgery

## 2021-07-01 ENCOUNTER — Ambulatory Visit: Payer: Medicare HMO | Admitting: Urgent Care

## 2021-07-01 ENCOUNTER — Observation Stay
Admission: RE | Admit: 2021-07-01 | Discharge: 2021-07-02 | Disposition: A | Discharge status: Home / Self Care | Payer: Medicare HMO | Attending: Neurosurgery | Admitting: Neurosurgery

## 2021-07-01 DIAGNOSIS — M5116 Intervertebral disc disorders with radiculopathy, lumbar region: Secondary | ICD-10-CM | POA: Insufficient documentation

## 2021-07-01 DIAGNOSIS — K219 Gastro-esophageal reflux disease without esophagitis: Secondary | ICD-10-CM | POA: Diagnosis not present

## 2021-07-01 DIAGNOSIS — M5416 Radiculopathy, lumbar region: Secondary | ICD-10-CM | POA: Diagnosis present

## 2021-07-01 DIAGNOSIS — I129 Hypertensive chronic kidney disease with stage 1 through stage 4 chronic kidney disease, or unspecified chronic kidney disease: Secondary | ICD-10-CM | POA: Insufficient documentation

## 2021-07-01 DIAGNOSIS — M4807 Spinal stenosis, lumbosacral region: Secondary | ICD-10-CM | POA: Insufficient documentation

## 2021-07-01 DIAGNOSIS — M545 Low back pain, unspecified: Secondary | ICD-10-CM | POA: Diagnosis present

## 2021-07-01 DIAGNOSIS — Z79899 Other long term (current) drug therapy: Secondary | ICD-10-CM | POA: Diagnosis not present

## 2021-07-01 DIAGNOSIS — Z981 Arthrodesis status: Secondary | ICD-10-CM | POA: Diagnosis not present

## 2021-07-01 DIAGNOSIS — M48062 Spinal stenosis, lumbar region with neurogenic claudication: Secondary | ICD-10-CM | POA: Diagnosis not present

## 2021-07-01 DIAGNOSIS — Z419 Encounter for procedure for purposes other than remedying health state, unspecified: Secondary | ICD-10-CM

## 2021-07-01 DIAGNOSIS — N1832 Chronic kidney disease, stage 3b: Secondary | ICD-10-CM | POA: Diagnosis not present

## 2021-07-01 DIAGNOSIS — Z87891 Personal history of nicotine dependence: Secondary | ICD-10-CM | POA: Insufficient documentation

## 2021-07-01 HISTORY — PX: LUMBAR LAMINECTOMY/DECOMPRESSION MICRODISCECTOMY: SHX5026

## 2021-07-01 LAB — POCT I-STAT, CHEM 8
BUN: 12 mg/dL (ref 8–23)
Calcium, Ion: 1.32 mmol/L (ref 1.15–1.40)
Chloride: 102 mmol/L (ref 98–111)
Creatinine, Ser: 1.3 mg/dL — ABNORMAL HIGH (ref 0.44–1.00)
Glucose, Bld: 95 mg/dL (ref 70–99)
HCT: 39 % (ref 36.0–46.0)
Hemoglobin: 13.3 g/dL (ref 12.0–15.0)
Potassium: 3.4 mmol/L — ABNORMAL LOW (ref 3.5–5.1)
Sodium: 140 mmol/L (ref 135–145)
TCO2: 27 mmol/L (ref 22–32)

## 2021-07-01 SURGERY — LUMBAR LAMINECTOMY/DECOMPRESSION MICRODISCECTOMY 3 LEVELS
Anesthesia: General

## 2021-07-01 MED ORDER — BUPIVACAINE-EPINEPHRINE (PF) 0.5% -1:200000 IJ SOLN
INTRAMUSCULAR | Status: DC | PRN
  Administered 2021-07-01: 5 mL

## 2021-07-01 MED ORDER — ORAL CARE MOUTH RINSE: 15.0000 mL | Freq: Once | OROMUCOSAL | Status: AC

## 2021-07-01 MED ORDER — PANTOPRAZOLE SODIUM 40 MG PO TBEC
40.0000 mg | DELAYED_RELEASE_TABLET | Freq: Every day | ORAL | Status: DC
  Administered 2021-07-02: 40 mg via ORAL
  Filled 2021-07-01: qty 1

## 2021-07-01 MED ORDER — LORATADINE 10 MG PO TABS
10.0000 mg | ORAL_TABLET | Freq: Every day | ORAL | Status: DC
  Administered 2021-07-02: 10 mg via ORAL
  Filled 2021-07-01 (×2): qty 1

## 2021-07-01 MED ORDER — PHENOL 1.4 % MT LIQD
1.0000 | OROMUCOSAL | Status: DC | PRN
  Filled 2021-07-01: qty 177

## 2021-07-01 MED ORDER — 0.9 % SODIUM CHLORIDE (POUR BTL) OPTIME
TOPICAL | Status: DC | PRN
  Administered 2021-07-01: 1000 mL

## 2021-07-01 MED ORDER — LACTATED RINGERS IV SOLN: INTRAVENOUS | Status: DC

## 2021-07-01 MED ORDER — CHLORHEXIDINE GLUCONATE 0.12 % MT SOLN
15.0000 mL | Freq: Once | OROMUCOSAL | Status: AC
  Administered 2021-07-01: 15 mL via OROMUCOSAL

## 2021-07-01 MED ORDER — SODIUM CHLORIDE 0.9% FLUSH: 3.0000 mL | INTRAVENOUS | Status: DC | PRN

## 2021-07-01 MED ORDER — ONDANSETRON HCL 4 MG/2ML IJ SOLN: 4.0000 mg | Freq: Four times a day (QID) | INTRAMUSCULAR | Status: DC | PRN

## 2021-07-01 MED ORDER — PHENYLEPHRINE HCL (PRESSORS) 10 MG/ML IV SOLN
INTRAVENOUS | Status: AC
  Filled 2021-07-01: qty 1

## 2021-07-01 MED ORDER — BUPIVACAINE HCL (PF) 0.5 % IJ SOLN
INTRAMUSCULAR | Status: DC | PRN
  Administered 2021-07-01: 20 mL

## 2021-07-01 MED ORDER — LEVOTHYROXINE SODIUM 50 MCG PO TABS
75.0000 ug | ORAL_TABLET | Freq: Every day | ORAL | Status: DC
  Administered 2021-07-02: 75 ug via ORAL
  Filled 2021-07-01: qty 1

## 2021-07-01 MED ORDER — CEFAZOLIN SODIUM-DEXTROSE 2-4 GM/100ML-% IV SOLN
INTRAVENOUS | Status: AC
  Filled 2021-07-01: qty 100

## 2021-07-01 MED ORDER — FENTANYL CITRATE (PF) 100 MCG/2ML IJ SOLN
INTRAMUSCULAR | Status: AC
  Filled 2021-07-01: qty 2

## 2021-07-01 MED ORDER — POLYETHYLENE GLYCOL 3350 17 G PO PACK: 17.0000 g | PACK | Freq: Every day | ORAL | Status: DC | PRN

## 2021-07-01 MED ORDER — OXYCODONE HCL 5 MG PO TABS
10.0000 mg | ORAL_TABLET | ORAL | Status: DC | PRN
  Administered 2021-07-01 (×2): 10 mg via ORAL
  Filled 2021-07-01 (×3): qty 2

## 2021-07-01 MED ORDER — METHYLPREDNISOLONE ACETATE 40 MG/ML IJ SUSP
INTRAMUSCULAR | Status: DC | PRN
  Administered 2021-07-01: 40 mg

## 2021-07-01 MED ORDER — SODIUM CHLORIDE 0.9% FLUSH
3.0000 mL | Freq: Two times a day (BID) | INTRAVENOUS | Status: DC
  Administered 2021-07-01 – 2021-07-02 (×2): 3 mL via INTRAVENOUS

## 2021-07-01 MED ORDER — CHLORHEXIDINE GLUCONATE 0.12 % MT SOLN
OROMUCOSAL | Status: AC
  Filled 2021-07-01: qty 15

## 2021-07-01 MED ORDER — MEPERIDINE HCL 25 MG/ML IJ SOLN: 6.2500 mg | INTRAMUSCULAR | Status: DC | PRN

## 2021-07-01 MED ORDER — ACETAMINOPHEN 10 MG/ML IV SOLN
INTRAVENOUS | Status: AC
  Filled 2021-07-01: qty 100

## 2021-07-01 MED ORDER — OXYCODONE HCL 5 MG PO TABS
5.0000 mg | ORAL_TABLET | Freq: Once | ORAL | Status: DC | PRN
Start: 2021-07-01 — End: 2021-07-01

## 2021-07-01 MED ORDER — LIDOCAINE HCL (CARDIAC) PF 100 MG/5ML IV SOSY
PREFILLED_SYRINGE | INTRAVENOUS | Status: DC | PRN
  Administered 2021-07-01: 100 mg via INTRAVENOUS

## 2021-07-01 MED ORDER — SODIUM CHLORIDE 0.9 % IV SOLN
INTRAVENOUS | Status: DC | PRN
  Administered 2021-07-01: 40 mL

## 2021-07-01 MED ORDER — PROPOFOL 10 MG/ML IV BOLUS
INTRAVENOUS | Status: DC | PRN
Start: 2021-07-01 — End: 2021-07-01
  Administered 2021-07-01: 100 mg via INTRAVENOUS
  Administered 2021-07-01: 50 mg via INTRAVENOUS

## 2021-07-01 MED ORDER — HEMOSTATIC AGENTS (NO CHARGE) OPTIME
TOPICAL | Status: DC | PRN
  Administered 2021-07-01: 1 via TOPICAL

## 2021-07-01 MED ORDER — LOSARTAN POTASSIUM 50 MG PO TABS
50.0000 mg | ORAL_TABLET | Freq: Every day | ORAL | Status: DC
  Administered 2021-07-02: 50 mg via ORAL
  Filled 2021-07-01: qty 1

## 2021-07-01 MED ORDER — SUCCINYLCHOLINE CHLORIDE 200 MG/10ML IV SOSY
PREFILLED_SYRINGE | INTRAVENOUS | Status: DC | PRN
  Administered 2021-07-01: 40 mg via INTRAVENOUS

## 2021-07-01 MED ORDER — CITALOPRAM HYDROBROMIDE 20 MG PO TABS
20.0000 mg | ORAL_TABLET | Freq: Every day | ORAL | Status: DC
  Administered 2021-07-02: 20 mg via ORAL
  Filled 2021-07-01 (×3): qty 1

## 2021-07-01 MED ORDER — BISACODYL 5 MG PO TBEC: 5.0000 mg | DELAYED_RELEASE_TABLET | Freq: Every day | ORAL | Status: DC | PRN

## 2021-07-01 MED ORDER — REMIFENTANIL HCL 1 MG IV SOLR
INTRAVENOUS | Status: AC
  Filled 2021-07-01: qty 1000

## 2021-07-01 MED ORDER — DEXAMETHASONE SODIUM PHOSPHATE 10 MG/ML IJ SOLN
INTRAMUSCULAR | Status: DC | PRN
  Administered 2021-07-01: 10 mg via INTRAVENOUS

## 2021-07-01 MED ORDER — MENTHOL 3 MG MT LOZG
1.0000 | LOZENGE | OROMUCOSAL | Status: DC | PRN
  Filled 2021-07-01: qty 9

## 2021-07-01 MED ORDER — TRIAMTERENE-HCTZ 37.5-25 MG PO TABS
1.0000 | ORAL_TABLET | Freq: Every day | ORAL | Status: DC
  Administered 2021-07-02: 1 via ORAL
  Filled 2021-07-01 (×2): qty 1

## 2021-07-01 MED ORDER — FENTANYL CITRATE (PF) 100 MCG/2ML IJ SOLN
INTRAMUSCULAR | Status: DC | PRN
  Administered 2021-07-01: 100 ug via INTRAVENOUS

## 2021-07-01 MED ORDER — REMIFENTANIL HCL 1 MG IV SOLR
INTRAVENOUS | Status: DC | PRN
  Administered 2021-07-01: .15 ug/kg/min via INTRAVENOUS

## 2021-07-01 MED ORDER — SODIUM CHLORIDE 0.9 % IV SOLN
INTRAVENOUS | Status: DC | PRN
  Administered 2021-07-01: 25 ug/min via INTRAVENOUS

## 2021-07-01 MED ORDER — EPHEDRINE SULFATE 50 MG/ML IJ SOLN
INTRAMUSCULAR | Status: DC | PRN
  Administered 2021-07-01 (×2): 10 mg via INTRAVENOUS

## 2021-07-01 MED ORDER — ONDANSETRON HCL 4 MG PO TABS: 4.0000 mg | ORAL_TABLET | Freq: Four times a day (QID) | ORAL | Status: DC | PRN

## 2021-07-01 MED ORDER — OXYCODONE HCL 5 MG PO TABS
5.0000 mg | ORAL_TABLET | ORAL | Status: DC | PRN
  Administered 2021-07-01: 5 mg via ORAL
  Filled 2021-07-01: qty 1

## 2021-07-01 MED ORDER — FENTANYL CITRATE (PF) 100 MCG/2ML IJ SOLN: 25.0000 ug | INTRAMUSCULAR | Status: DC | PRN

## 2021-07-01 MED ORDER — CELECOXIB 200 MG PO CAPS
200.0000 mg | ORAL_CAPSULE | Freq: Two times a day (BID) | ORAL | Status: DC
  Administered 2021-07-01 – 2021-07-02 (×2): 200 mg via ORAL
  Filled 2021-07-01 (×3): qty 1

## 2021-07-01 MED ORDER — ONDANSETRON HCL 4 MG/2ML IJ SOLN
INTRAMUSCULAR | Status: DC | PRN
  Administered 2021-07-01 (×2): 4 mg via INTRAVENOUS

## 2021-07-01 MED ORDER — AMLODIPINE BESYLATE 5 MG PO TABS
7.5000 mg | ORAL_TABLET | Freq: Every day | ORAL | Status: DC
  Administered 2021-07-02: 7.5 mg via ORAL
  Filled 2021-07-01 (×2): qty 2

## 2021-07-01 MED ORDER — FIBRIN SEALANT 2 ML SINGLE DOSE KIT
PACK | CUTANEOUS | Status: DC | PRN
  Administered 2021-07-01: 2 mL via TOPICAL

## 2021-07-01 MED ORDER — THROMBIN 5000 UNITS EX SOLR
CUTANEOUS | Status: DC | PRN
  Administered 2021-07-01: 5000 [IU] via TOPICAL

## 2021-07-01 MED ORDER — SODIUM CHLORIDE 0.9 % IV SOLN: 250.0000 mL | INTRAVENOUS | Status: DC

## 2021-07-01 MED ORDER — PHENYLEPHRINE HCL (PRESSORS) 10 MG/ML IV SOLN
INTRAVENOUS | Status: DC | PRN
  Administered 2021-07-01: 100 ug via INTRAVENOUS

## 2021-07-01 MED ORDER — FLEET ENEMA 7-19 GM/118ML RE ENEM: 1.0000 | ENEMA | Freq: Once | RECTAL | Status: DC | PRN

## 2021-07-01 MED ORDER — CEFAZOLIN SODIUM-DEXTROSE 2-4 GM/100ML-% IV SOLN
2.0000 g | Freq: Once | INTRAVENOUS | Status: AC
  Administered 2021-07-01: 2 g via INTRAVENOUS

## 2021-07-01 MED ORDER — GLYCOPYRROLATE 0.2 MG/ML IJ SOLN
INTRAMUSCULAR | Status: DC | PRN
  Administered 2021-07-01: .2 mg via INTRAVENOUS

## 2021-07-01 MED ORDER — MORPHINE SULFATE (PF) 2 MG/ML IV SOLN: 2.0000 mg | INTRAVENOUS | Status: DC | PRN

## 2021-07-01 MED ORDER — METHOCARBAMOL 1000 MG/10ML IJ SOLN
500.0000 mg | Freq: Four times a day (QID) | INTRAVENOUS | Status: DC | PRN
  Filled 2021-07-01: qty 5

## 2021-07-01 MED ORDER — METHOCARBAMOL 500 MG PO TABS
500.0000 mg | ORAL_TABLET | Freq: Four times a day (QID) | ORAL | Status: DC | PRN
  Administered 2021-07-01 (×2): 500 mg via ORAL
  Filled 2021-07-01 (×2): qty 1

## 2021-07-01 MED ORDER — ONDANSETRON HCL 4 MG/2ML IJ SOLN: 4.0000 mg | Freq: Once | INTRAMUSCULAR | Status: DC | PRN

## 2021-07-01 MED ORDER — SODIUM CHLORIDE 0.9 % IV SOLN: INTRAVENOUS | Status: DC

## 2021-07-01 MED ORDER — ACETAMINOPHEN 500 MG PO TABS
1000.0000 mg | ORAL_TABLET | Freq: Four times a day (QID) | ORAL | Status: AC
  Administered 2021-07-01 – 2021-07-02 (×4): 1000 mg via ORAL
  Filled 2021-07-01 (×4): qty 2

## 2021-07-01 MED ORDER — OXYCODONE HCL 5 MG/5ML PO SOLN: 5.0000 mg | Freq: Once | ORAL | Status: DC | PRN

## 2021-07-01 MED ORDER — SENNA 8.6 MG PO TABS
1.0000 | ORAL_TABLET | Freq: Two times a day (BID) | ORAL | Status: DC
  Administered 2021-07-01 – 2021-07-02 (×3): 8.6 mg via ORAL
  Filled 2021-07-01 (×3): qty 1

## 2021-07-01 SURGICAL SUPPLY — 53 items
BUR NEURO DRILL SOFT 3.0X3.8M (BURR) ×2 IMPLANT
CHLORAPREP W/TINT 26 (MISCELLANEOUS) ×2 IMPLANT
CNTNR SPEC 2.5X3XGRAD LEK (MISCELLANEOUS) ×1
CONT SPEC 4OZ STER OR WHT (MISCELLANEOUS) ×1
CONTAINER SPEC 2.5X3XGRAD LEK (MISCELLANEOUS) ×1 IMPLANT
COUNTER NEEDLE 20/40 LG (NEEDLE) ×2 IMPLANT
CUP MEDICINE 2OZ PLAST GRAD ST (MISCELLANEOUS) ×4 IMPLANT
DERMABOND ADVANCED (GAUZE/BANDAGES/DRESSINGS) ×1
DERMABOND ADVANCED .7 DNX12 (GAUZE/BANDAGES/DRESSINGS) ×1 IMPLANT
DRAPE LAPAROTOMY 100X77 ABD (DRAPES) ×2 IMPLANT
DRAPE MICROSCOPE SPINE 48X150 (DRAPES) ×2 IMPLANT
DRAPE SURG 17X11 SM STRL (DRAPES) ×8 IMPLANT
DRSG TEGADERM 4X4.75 (GAUZE/BANDAGES/DRESSINGS) ×2 IMPLANT
DRSG TELFA 4X3 1S NADH ST (GAUZE/BANDAGES/DRESSINGS) ×2 IMPLANT
ELECT CAUTERY BLADE TIP 2.5 (TIP) ×2
ELECT EZSTD 165MM 6.5IN (MISCELLANEOUS) ×2
ELECTRODE CAUTERY BLDE TIP 2.5 (TIP) ×1 IMPLANT
ELECTRODE EZSTD 165MM 6.5IN (MISCELLANEOUS) ×1 IMPLANT
GAUZE XEROFORM 1X8 LF (GAUZE/BANDAGES/DRESSINGS) ×2 IMPLANT
GLOVE SURG SYN 6.5 ES PF (GLOVE) ×2 IMPLANT
GLOVE SURG SYN 8.5  E (GLOVE) ×3
GLOVE SURG SYN 8.5 E (GLOVE) ×3 IMPLANT
GLOVE SURG UNDER POLY LF SZ6.5 (GLOVE) ×2 IMPLANT
GOWN SRG LRG LVL 4 IMPRV REINF (GOWNS) ×1 IMPLANT
GOWN SRG XL LVL 3 NONREINFORCE (GOWNS) ×1 IMPLANT
GOWN STRL NON-REIN TWL XL LVL3 (GOWNS) ×1
GOWN STRL REIN LRG LVL4 (GOWNS) ×1
GOWN STRL REUS W/ TWL XL LVL3 (GOWN DISPOSABLE) IMPLANT
GOWN STRL REUS W/TWL XL LVL3 (GOWN DISPOSABLE)
GRADUATE 1200CC STRL 31836 (MISCELLANEOUS) ×4 IMPLANT
GRAFT DURAGEN MATRIX 1WX1L (Tissue) ×2 IMPLANT
KIT SPINAL PRONEVIEW (KITS) ×2 IMPLANT
MARKER SKIN DUAL TIP RULER LAB (MISCELLANEOUS) ×4 IMPLANT
NDL SAFETY ECLIPSE 18X1.5 (NEEDLE) IMPLANT
NEEDLE HYPO 18GX1.5 SHARP (NEEDLE)
NEEDLE HYPO 22GX1.5 SAFETY (NEEDLE) ×2 IMPLANT
NS IRRIG 1000ML POUR BTL (IV SOLUTION) ×2 IMPLANT
NS IRRIG 500ML POUR BTL (IV SOLUTION) ×2 IMPLANT
PACK LAMINECTOMY NEURO (CUSTOM PROCEDURE TRAY) ×2 IMPLANT
PAD ARMBOARD 7.5X6 YLW CONV (MISCELLANEOUS) ×2 IMPLANT
SPOGE SURGIFLO 8M (HEMOSTASIS) ×1
SPONGE SURGIFLO 8M (HEMOSTASIS) ×1 IMPLANT
SUT DVC VLOC 3-0 CL 6 P-12 (SUTURE) ×2 IMPLANT
SUT ETHILON 3-0 FS-10 30 BLK (SUTURE) ×2
SUT VIC AB 0 CT1 27 (SUTURE) ×1
SUT VIC AB 0 CT1 27XCR 8 STRN (SUTURE) ×1 IMPLANT
SUT VIC AB 2-0 CT1 18 (SUTURE) ×2 IMPLANT
SUTURE EHLN 3-0 FS-10 30 BLK (SUTURE) ×1 IMPLANT
SYR 20ML LL LF (SYRINGE) ×2 IMPLANT
SYR 30ML LL (SYRINGE) ×4 IMPLANT
SYR 3ML LL SCALE MARK (SYRINGE) ×2 IMPLANT
TOWEL OR 17X26 4PK STRL BLUE (TOWEL DISPOSABLE) ×6 IMPLANT
TUBING CONNECTING 10 (TUBING) ×2 IMPLANT

## 2021-07-01 NOTE — Progress Notes (Signed)
Patient must lay flat for 24 hours. 24 hours started at 11am per surgery. Unable to do complete skin assessment on patient's back side.

## 2021-07-01 NOTE — Op Note (Signed)
Indications: Mrs. Hukill is a 80 yo female with lumbar radiculopathy and lumbar stenosis.  She had refractory symptoms prompting surgical intervention  Findings: severe stenosis, disc herniation, calcified synovial cyst  Preoperative Diagnosis: Lumbar Stenosis with neurogenic claudication, lumbar radiculopathy, spinal stenosis of lumbosacral region Postoperative Diagnosis: same   EBL: 25 ml IVF: 600 ml Drains: none Disposition: Extubated and Stable to PACU Complications: none  No foley catheter was placed.   Preoperative Note:   Risks of surgery discussed include: infection, bleeding, stroke, coma, death, paralysis, CSF leak, nerve/spinal cord injury, numbness, tingling, weakness, complex regional pain syndrome, recurrent stenosis and/or disc herniation, vascular injury, development of instability, neck/back pain, need for further surgery, persistent symptoms, development of deformity, and the risks of anesthesia. The patient understood these risks and agreed to proceed.  Operative Note:   1. L4-S1 lumbar decompression including central laminectomy and bilateral medial facetectomies including foraminotomies 2. Left L3-4 microdiscectomy  The patient was then brought from the preoperative center with intravenous access established.  The patient underwent general anesthesia and endotracheal tube intubation, and was then rotated on the [place] rail top where all pressure points were appropriately padded.  The skin was then thoroughly cleansed.  Perioperative antibiotic prophylaxis was administered.  Sterile prep and drapes were then applied and a timeout was then observed.  C-arm was brought into the field under sterile conditions and under lateral visualization the L5-S1 interspace was identified and marked.  The incision was marked on the left and injected with local anesthetic. Once this was complete a 6 cm incision was opened with the use of a #10 blade knife.    The metrx tubes were  sequentially advanced and confirmed in position at L5-S1. An 73mm by 57mm tube was locked in place to the bed side attachment.  The microscope was then sterilely brought into the field and muscle creep was hemostased with a bipolar and resected with a pituitary rongeur.  A Bovie extender was then used to expose the spinous process and lamina.  Careful attention was placed to not violate the facet capsule. A 3 mm matchstick drill bit was then used to make a hemi-laminotomy trough until the ligamentum flavum was exposed.  This was extended to the base of the spinous process and to the contralateral side to remove all the central bone from each side.  Once this was complete and the underlying ligamentum flavum was visualized, it was dissected with a curette and resected with Kerrison rongeurs.  Extensive ligamentum hypertrophy was noted, requiring a substantial amount of time and care for removal.  The dura was identified and palpated. The kerrison rongeur was then used to remove the medial facet bilaterally until no compression was noted.  A balltip probe was used to confirm decompression of the ipsilateral S1 nerve root.  Additional attention was paid to completion of the contralateral L5-S1 foraminotomy until the contralateral traversing nerve root was completely free.  Once this was complete, L5-S1 central decompression including medial facetectomy and foraminotomy was confirmed and decompression on both sides was confirmed. No CSF leak was noted.  A Depo-Medrol soaked Gelfoam pledget was placed in the defect.  The wound was copiously irrigated. The tube system was then removed under microscopic visualization and hemostasis was obtained with a bipolar.    After performing the decompression at L5-S1, the metrx tubes were sequentially advanced and confirmed in position at L4-5. An 67mm by 77mm tube was locked in place to the bed side attachment.  Fluoroscopy was then  removed from the field.  The microscope was  then sterilely brought into the field and muscle creep was hemostased with a bipolar and resected with a pituitary rongeur.  A Bovie extender was then used to expose the spinous process and lamina.  Careful attention was placed to not violate the facet capsule. A 3 mm matchstick drill bit was then used to make a hemi-laminotomy trough until the ligamentum flavum was exposed.  This was extended to the base of the spinous process and to the contralateral side to remove all the central bone from each side.  Once this was complete and the underlying ligamentum flavum was visualized, it was dissected with a curette and resected with Kerrison rongeurs.  Extensive ligamentum hypertrophy was noted, requiring a substantial amount of time and care for removal.  The dura was identified and palpated.   A large calcified synovial cyst was encountered.  This was severely adherent to the dura, which opened with removal of the cyst.  The nerve roots were carefully protected to complete the decompression   The kerrison rongeur was then used to remove the medial facet bilaterally until no compression was noted.  A balltip probe was used to confirm decompression of the ipsilateral L5 nerve root.  Additional attention was paid to completion of the contralateral foraminotomy until the contralateral L5 nerve root was completely free.  Once this was complete, L4-5 central decompression including medial facetectomy and foraminotomy was confirmed and decompression on both sides was confirmed.   After the decompression was complete, duragen and tisseel were used to seal the leak.The tube system was then removed under microscopic visualization and hemostasis was obtained with a bipolar.    After performing the decompression at L4-5, the metrx tubes were sequentially advanced and confirmed in position at L3-4. An 66mm by 57mm tube was locked in place to the bed side attachment.  Fluoroscopy was then removed from the field.  The  microscope was then sterilely brought into the field and muscle creep was hemostased with a bipolar and resected with a pituitary rongeur.  A Bovie extender was then used to expose the spinous process and lamina.  Careful attention was placed to not violate the facet capsule. A 3 mm matchstick drill bit was then used to make a hemi-laminotomy trough until the ligamentum flavum was exposed.  This was extended to the base of the spinous process and to the contralateral side to remove all the central bone from each side.  Once this was complete and the underlying ligamentum flavum was visualized, it was dissected with a curette and resected with Kerrison rongeurs.  Extensive ligamentum hypertrophy was noted, requiring a substantial amount of time and care for removal.  The dura was identified and palpated. The kerrison rongeur was then used to remove the medial facet on the left.  A large disc herniation was noted.  This was entered with a small penfield, then the disc herniation dissected free.  The disc herniation was then removed in piecemeal fashion until no compression was noted.  A balltip probe was used to confirm decompression of the ipsilateral L4 nerve root.  No CSF leak was noted at this level.  A Depo-Medrol soaked Gelfoam pledget was placed in the defect.  The wound was copiously irrigated. The tube system was then removed under microscopic visualization and hemostasis was obtained with a bipolar.     The fascial layer was reapproximated with the use of a 0 Vicryl suture.  Subcutaneous tissue layer was  reapproximated using 2-0 Vicryl suture.  3-0 nylon was used on the skin. A dressing was placed. The patient was then rotated back to the preoperative bed awakened from anesthesia and taken to recovery all counts are correct in this case.  I performed the entire procedure with the assistance of Cooper Render PA as an Pensions consultant.  Jayston Trevino K. Izora Ribas MD

## 2021-07-01 NOTE — Transfer of Care (Signed)
Immediate Anesthesia Transfer of Care Note  Patient: Latoya Freeman  Procedure(s) Performed: LEFT L3-4 MICRODSICECTOMY, L4-S1 DECOMPRESSION  Patient Location: PACU  Anesthesia Type:General  Level of Consciousness: awake, drowsy and patient cooperative  Airway & Oxygen Therapy: Patient Spontanous Breathing and Patient connected to face mask oxygen  Post-op Assessment: Report given to RN and Post -op Vital signs reviewed and stable  Post vital signs: Reviewed and stable  Last Vitals:  Vitals Value Taken Time  BP 128/58 07/01/21 1215  Temp    Pulse 99 07/01/21 1220  Resp 17 07/01/21 1220  SpO2 95 % 07/01/21 1220  Vitals shown include unvalidated device data.  Last Pain:  Vitals:   07/01/21 0811  TempSrc: Temporal  PainSc: 0-No pain         Complications: No notable events documented.

## 2021-07-01 NOTE — Anesthesia Postprocedure Evaluation (Signed)
Anesthesia Post Note  Patient: Latoya Freeman  Procedure(s) Performed: LEFT L3-4 MICRODSICECTOMY, L4-S1 DECOMPRESSION  Patient location during evaluation: PACU Anesthesia Type: General Level of consciousness: awake and alert Pain management: pain level controlled Vital Signs Assessment: post-procedure vital signs reviewed and stable Respiratory status: spontaneous breathing, nonlabored ventilation, respiratory function stable and patient connected to nasal cannula oxygen Cardiovascular status: blood pressure returned to baseline and stable Postop Assessment: no apparent nausea or vomiting Anesthetic complications: no   No notable events documented.   Last Vitals:  Vitals:   07/01/21 1330 07/01/21 1357  BP: (!) 116/55 (!) 108/57  Pulse: 89 80  Resp: 13 16  Temp:  36.8 C  SpO2: 91% 96%    Last Pain:  Vitals:   07/01/21 1357  TempSrc: Oral  PainSc: 2                  Michel Hendon Doyne Keel

## 2021-07-01 NOTE — Anesthesia Preprocedure Evaluation (Signed)
Anesthesia Evaluation  Patient identified by MRN, date of birth, ID band Patient awake    Reviewed: Allergy & Precautions, NPO status , Patient's Chart, lab work & pertinent test results  History of Anesthesia Complications Negative for: history of anesthetic complications  Airway Mallampati: II  TM Distance: <3 FB Neck ROM: Full    Dental no notable dental hx.    Pulmonary former smoker,    Pulmonary exam normal        Cardiovascular Exercise Tolerance: Good hypertension, Pt. on medications Normal cardiovascular examI     Neuro/Psych    GI/Hepatic   Endo/Other    Renal/GU      Musculoskeletal   Abdominal Normal abdominal exam  (+)   Peds  Hematology   Anesthesia Other Findings   Reproductive/Obstetrics                             Anesthesia Physical Anesthesia Plan  ASA: 3  Anesthesia Plan: General   Post-op Pain Management:    Induction: Intravenous  PONV Risk Score and Plan: Ondansetron and Metaclopromide  Airway Management Planned: Oral ETT  Additional Equipment:   Intra-op Plan:   Post-operative Plan: Extubation in OR  Informed Consent: I have reviewed the patients History and Physical, chart, labs and discussed the procedure including the risks, benefits and alternatives for the proposed anesthesia with the patient or authorized representative who has indicated his/her understanding and acceptance.       Plan Discussed with: CRNA  Anesthesia Plan Comments:         Anesthesia Quick Evaluation

## 2021-07-01 NOTE — Plan of Care (Signed)

## 2021-07-01 NOTE — Anesthesia Procedure Notes (Signed)
Procedure Name: Intubation Date/Time: 07/01/2021 9:05 AM Performed by: Kelton Pillar, CRNA Pre-anesthesia Checklist: Patient identified, Emergency Drugs available, Suction available and Patient being monitored Patient Re-evaluated:Patient Re-evaluated prior to induction Oxygen Delivery Method: Circle system utilized Preoxygenation: Pre-oxygenation with 100% oxygen Induction Type: IV induction Ventilation: Mask ventilation without difficulty Laryngoscope Size: McGraph and 3 Grade View: Grade I Tube type: Oral Number of attempts: 1 Airway Equipment and Method: Stylet and Oral airway Placement Confirmation: ETT inserted through vocal cords under direct vision, positive ETCO2 and breath sounds checked- equal and bilateral Secured at: 21 cm Tube secured with: Tape Dental Injury: Teeth and Oropharynx as per pre-operative assessment

## 2021-07-01 NOTE — Progress Notes (Signed)
Patient resting in bed comfortably. Patient laying flat. Pain is a 2 in the back at this time. VSS. Admission went over with patient. Diet order placed by MD.  07/01/21 1357  Vitals  Temp 98.2 F (36.8 C)  Temp Source Oral  BP (!) 108/57  MAP (mmHg) 73  BP Location Right Arm  BP Method Automatic  Patient Position (if appropriate) Lying  Pulse Rate 80  Pulse Rate Source Monitor  Resp 16  MEWS COLOR  MEWS Score Color Green  Oxygen Therapy  SpO2 96 %  O2 Device Nasal Cannula  O2 Flow Rate (L/min) 2 L/min

## 2021-07-01 NOTE — H&P (Signed)
History of Present Illness: 07/01/2021 Latoya Freeman is a 80 yo female who has history as below.  She had a diagnostic hip injection.  It did not provide relief.    05/29/2021  Latoya Freeman is here today to review her myelogram and symptoms. She continues to have pain around her left hip and into her groin. Her pain is very severe.  She was discharged from physical therapy.  05/07/21 Latoya Freeman is here today with a chief complaint of left low back pain that radiates to her left buttock, hip, lateral thigh and occasionally to her left lateral shin and ankle with tingling in her foot. She also reports intermittent left groin pain and medial thigh pain.  Her pain began in November 2021. Movement, standing, walking, bending, and twisting make her pain worse. Resting and sitting still make it better. She denies weakness. Bowel/Bladder Dysfunction: none  Of note, she has a rare abdominal issue that requires placement of a stent. She needs to have her stent replaced in the next 2 weeks  Conservative measures: chiropractor 2 times per week for approximately 3 months without improvement Physical therapy: currently participating (has completed 2 visits) Multimodal medical therapy including regular antiinflammatories: tylenol, gabapentin, tramadol, prednisone Injections: has received epidural steroid injections without relief 03/12/2021: Left L5-S1 and left S1 transforaminal ESI by Dr Sharlet Salina  Past Surgery: denies  Latoya Freeman has no symptoms of cervical myelopathy.  The symptoms are causing a significant impact on the patient's life.   Review of Systems:  A 10 point review of systems is negative, except for the pertinent positives and negatives detailed in the HPI.  Past Medical History: Past Medical History:  Diagnosis Date   Anemia in chronic kidney disease 04/23/2020   Benign breast lumps  and cysts   Brain aneurysm  history of at age 70 with surgery, has done well   Cyst of left ovary  04/04/2019   Endometrial thickening on ultrasound 04/04/2019   GERD (gastroesophageal reflux disease)   HCAP (healthcare-associated pneumonia) 07/27/2018   Heart disease   Hemorrhoids   History of pulmonary embolism 04/29/2020   Hypercalcemia 12/18/2019   Hypertension   Hypertensive chronic kidney disease with stage 1 through stage 4 chronic kidney disease, or unspecified chronic kidney disease 12/18/2019   Left wrist tendonitis   Menopausal symptoms   Obesity (BMI 30-39.9), unspecified 06/21/2018   Pulmonary embolism (CMS-HCC) 02/23/2020   Renal mass 02/23/2020   Secondary hyperparathyroidism of renal origin (CMS-HCC) 04/23/2020   Shingles   Stage 3b chronic kidney disease (CMS-HCC) 12/18/2019   Thyroid disease   Urinary tract infection 04/23/2020   Past Surgical History: Past Surgical History:  Procedure Laterality Date   CELIAC ARTERY ANEURYSM REPAIR   cerebral aneurysm repair   CHOLECYSTECTOMY   COLONOSCOPY 03/2013  5 yrs   COLONOSCOPY 01/31/2018  Tubular adenoma of the colon/Repeat 63yrs/MUS   EGD 01/31/2018  GERD/Barrett's Esophagus/Repeat 12yrs/MUS   SHOULDER ARTHROSCOPY W/ SUBACROMIAL DECOMPRESSION AND DISTAL CLAVICLE EXCISION Left 07/25/2018  mini-open rotaotr cuff repair, biceps tenodesis    Current Meds  Medication Sig   acetaminophen (TYLENOL) 500 MG tablet Take 500-1,000 mg by mouth every 6 (six) hours as needed for moderate pain.   amLODipine (NORVASC) 5 MG tablet Take 7.5 mg by mouth daily.   cetirizine (ZYRTEC) 10 MG tablet Take 10 mg by mouth daily as needed for allergies.   citalopram (CELEXA) 20 MG tablet Take 20 mg by mouth daily.   clopidogrel (PLAVIX) 75 MG tablet  Take 75 mg by mouth at bedtime.   ELIQUIS 5 MG TABS tablet TAKE 1 TABLET TWICE DAILY (Patient taking differently: Take 5 mg by mouth 2 (two) times daily.)   levothyroxine (SYNTHROID, LEVOTHROID) 75 MCG tablet Take 75 mcg by mouth daily.   losartan (COZAAR) 50 MG tablet Take 50 mg by mouth daily.     omeprazole (PRILOSEC) 20 MG capsule Take 20 mg by mouth 2 (two) times daily.   traMADol (ULTRAM) 50 MG tablet Take 50-100 mg by mouth 2 (two) times daily as needed for moderate pain.   triamterene-hydrochlorothiazide (MAXZIDE-25) 37.5-25 MG tablet Take 1 tablet by mouth daily.    Allergies  Allergen Reactions   Bee Venom Swelling   Shellfish Allergy Swelling   Tamoxifen Hives   Penicillins Hives, Rash and Other (See Comments)    Has patient had a PCN reaction causing immediate rash, facial/tongue/throat swelling, SOB or lightheadedness with hypotension: Unknown Has patient had a PCN reaction causing severe rash involving mucus membranes or skin necrosis: Unknown Has patient had a PCN reaction that required hospitalization: Unknown Has patient had a PCN reaction occurring within the last 10 years: No If all of the above answers are "NO", then may proceed with Cephalosporin use.      Social History: Social History   Tobacco Use   Smoking status: Former Smoker  Types: Cigarettes  Quit date: 08/26/1994  Years since quitting: 26.7   Smokeless tobacco: Never Used  Scientific laboratory technician Use: Never used  Substance Use Topics   Alcohol use: No  Alcohol/week: 0.0 standard drinks   Drug use: No   Family Medical History: Family History  Problem Relation Age of Onset   Aneurysm Mother   Stroke Mother   Myocardial Infarction (Heart attack) Father 72   Heart disease Father   Colon cancer Brother   Lung cancer Brother   Kidney disease Brother   Myocardial Infarction (Heart attack) Brother   COPD Sister   Physical Examination:  Vitals:   07/01/21 0811  BP: (!) 132/92  Pulse: 64  Resp: 16  Temp: 97.6 F (36.4 C)  SpO2: 96%   Heart sounds normal no MRG. Chest Clear to Auscultation Bilaterally.   General: Patient is well developed, well nourished, calm, collected, and in no apparent distress. Attention to examination is appropriate.  Psychiatric: Patient is  non-anxious.  Head: Pupils equal, round, and reactive to light.  ENT: Oral mucosa appears well hydrated.  Neck: Supple. Full range of motion.  Respiratory: Patient is breathing without any difficulty.  Extremities: No edema.  Vascular: Palpable dorsal pedal pulses.  Skin: On exposed skin, there are no abnormal skin lesions.  NEUROLOGICAL:   Awake, alert, oriented to person, place, and time. Speech is clear and fluent. Fund of knowledge is appropriate.   Cranial Nerves: Pupils equal round and reactive to light. Facial tone is symmetric. Facial sensation is symmetric. Shoulder shrug is symmetric. Tongue protrusion is midline. There is no pronator drift.  Strength: Side Biceps Triceps Deltoid Interossei Grip Wrist Ext. Wrist Flex.  R 5 5 5 5 5 5 5   L 5 5 5 5 5 5 5    Side Iliopsoas Quads Hamstring PF DF EHL  R 5 5 5 5 5 5   L 5 5 5 5 5 5    Reflexes are 1+ and symmetric at the biceps, triceps, brachioradialis, patella and achilles. Hoffman's is absent.  Clonus is not present. Toes are down-going.  Bilateral upper and lower extremity sensation  is intact to light touch.  Gait is abnormal and requires a walker.   FABER testing is negative bilaterally  No evidence of dysmetria noted.  Medical Decision Making  Imaging: CT Lumbar 03/10/21  IMPRESSION:  1. Rightward curvature of the lumbar spine is centered at L3.  2. Grade 1 anterolisthesis at L4-5 with moderate to severe central  canal stenosis and moderate foraminal narrowing bilaterally, worse  on the left.  3. Moderate central and bilateral foraminal stenosis at L5-S1 is  worse on the right.  4. Mild central and bilateral foraminal narrowing at L3-4.  5. Aortic Atherosclerosis (ICD10-I70.0).   Electronically Signed    By: San Morelle M.D.    On: 03/10/2021 10:17  CT Myelo 05/22/2021 LUMBAR CT MYELOGRAM FINDINGS   Primarily extrathecal contrast injection, though with visible  multilevel degenerative disc  disease with varying degrees of spinal  canal and neural foraminal narrowing as described.   - L3-L4: Left paracentral disc herniation resulting in severe  narrowing of the left lateral recess and likely impingement of the  descending nerve root. Moderate narrowing of the left neural  foramen. Minimal anterolisthesis at this level.   - L4-L5: Broad-based disc bulging and facet degeneration resulting  in moderate spinal canal narrowing and mild bilateral neural  foraminal narrowing. Grade 1 anterolisthesis at this level.   - L5-S1: Broad-based disc bulging and facet degeneration resulting  in moderate spinal canal narrowing, and mild bilateral neural  foraminal narrowing.   If desired, the exam could be repeated with puncture attempt at a  higher disc level.   Electronically Signed:  By: Maurine Simmering  On: 05/22/2021 13:40  I have personally reviewed the images and agree with the above interpretation.  Assessment and Plan: Latoya Freeman is a pleasant 80 y.o. female with anterolisthesis and symptoms of lumbar radiculopathy. We will proceed with left L3-4 discectomy and L4-5 and L5-S1 decompressions.   Meade Maw MD, [facility] Department of Neurosurgery

## 2021-07-02 DIAGNOSIS — Z79899 Other long term (current) drug therapy: Secondary | ICD-10-CM | POA: Diagnosis not present

## 2021-07-02 DIAGNOSIS — N1832 Chronic kidney disease, stage 3b: Secondary | ICD-10-CM | POA: Diagnosis not present

## 2021-07-02 DIAGNOSIS — M48062 Spinal stenosis, lumbar region with neurogenic claudication: Secondary | ICD-10-CM | POA: Diagnosis not present

## 2021-07-02 DIAGNOSIS — M4807 Spinal stenosis, lumbosacral region: Secondary | ICD-10-CM | POA: Diagnosis not present

## 2021-07-02 DIAGNOSIS — M5116 Intervertebral disc disorders with radiculopathy, lumbar region: Secondary | ICD-10-CM | POA: Diagnosis not present

## 2021-07-02 DIAGNOSIS — I129 Hypertensive chronic kidney disease with stage 1 through stage 4 chronic kidney disease, or unspecified chronic kidney disease: Secondary | ICD-10-CM | POA: Diagnosis not present

## 2021-07-02 DIAGNOSIS — Z87891 Personal history of nicotine dependence: Secondary | ICD-10-CM | POA: Diagnosis not present

## 2021-07-02 LAB — CBC
HCT: 36.8 % (ref 36.0–46.0)
Hemoglobin: 13 g/dL (ref 12.0–15.0)
MCH: 32.5 pg (ref 26.0–34.0)
MCHC: 35.3 g/dL (ref 30.0–36.0)
MCV: 92 fL (ref 80.0–100.0)
Platelets: 267 10*3/uL (ref 150–400)
RBC: 4 MIL/uL (ref 3.87–5.11)
RDW: 12.7 % (ref 11.5–15.5)
WBC: 19.4 10*3/uL — ABNORMAL HIGH (ref 4.0–10.5)
nRBC: 0 % (ref 0.0–0.2)

## 2021-07-02 LAB — CREATININE, SERUM
Creatinine, Ser: 1.42 mg/dL — ABNORMAL HIGH (ref 0.44–1.00)
GFR, Estimated: 38 mL/min — ABNORMAL LOW (ref >60)

## 2021-07-02 MED ORDER — SENNA 8.6 MG PO TABS: 1.0000 | ORAL_TABLET | Freq: Two times a day (BID) | ORAL | 0 refills | Status: DC

## 2021-07-02 MED ORDER — HYDROCODONE-ACETAMINOPHEN 7.5-325 MG PO TABS: 1.0000 | ORAL_TABLET | Freq: Four times a day (QID) | ORAL | Status: DC | PRN

## 2021-07-02 MED ORDER — CALCIUM CARBONATE ANTACID 500 MG PO CHEW
1.0000 | CHEWABLE_TABLET | Freq: Three times a day (TID) | ORAL | Status: DC | PRN
  Administered 2021-07-02: 200 mg via ORAL
  Filled 2021-07-02: qty 1

## 2021-07-02 MED ORDER — HYDROCODONE-ACETAMINOPHEN 5-325 MG PO TABS: 1.0000 | ORAL_TABLET | Freq: Three times a day (TID) | ORAL | 0 refills | Status: AC | PRN

## 2021-07-02 MED ORDER — CELECOXIB 200 MG PO CAPS: 200.0000 mg | ORAL_CAPSULE | Freq: Two times a day (BID) | ORAL | 0 refills | Status: DC

## 2021-07-02 MED ORDER — HEPARIN SODIUM (PORCINE) 5000 UNIT/ML IJ SOLN
5000.0000 [IU] | Freq: Three times a day (TID) | INTRAMUSCULAR | Status: DC
  Administered 2021-07-02: 5000 [IU] via SUBCUTANEOUS
  Filled 2021-07-02: qty 1

## 2021-07-02 MED ORDER — BISACODYL 5 MG PO TBEC: 5.0000 mg | DELAYED_RELEASE_TABLET | Freq: Every day | ORAL | 0 refills | Status: DC | PRN

## 2021-07-02 MED ORDER — HYDROCODONE-ACETAMINOPHEN 5-325 MG PO TABS
1.0000 | ORAL_TABLET | Freq: Four times a day (QID) | ORAL | Status: DC | PRN
Start: 2021-07-02 — End: 2021-07-02

## 2021-07-02 NOTE — Evaluation (Signed)
Physical Therapy Evaluation Patient Details Name: Latoya Freeman MRN: [id number] DOB: [birth year] Today's Date: 07/02/2021   History of Present Illness  Patient is a 80 year old female s/p L4-S1 lumbar decompression, central laminectomy, bilateral medial facetectomies and foraminotomies, Left L3-4 microdiscectomy completed on 07/01/21. Interoperative CSF leak with strict bedrest until today at 11:00 am.  Clinical Impression  Patient agreeable to PT. Patient reports she lives alone and ambulates with rolling walker. Daughter assist some with meals at baseline and is available to help at discharge per patient report.   The patient reports no headache pain with activity. With verbal cues for logroll technique, patient demonstrated understanding. Patient was able to stand and ambulate in hallway with rolling walker around nursing station with occasional cues for safety. Recommend PT follow up to maximize independence and facilitate return to prior level of function. HHPT recommended at discharge.     Follow Up Recommendations Home health PT    Equipment Recommendations  None recommended by PT   Recommendations for Other Services       Precautions / Restrictions Precautions Precautions: Fall;Back Precaution Booklet Issued: Yes (comment) Restrictions Weight Bearing Restrictions: No      Mobility  Bed Mobility Overal bed mobility: Needs Assistance Bed Mobility: Supine to Sit Rolling: Min assist;+2 for physical assistance Sidelying to sit: Min assist;+2 for physical assistance       General bed mobility comments: verbal cues for safety and logroll technique. assistance for trunk support and intermittent LE support    Transfers Overall transfer level: Needs assistance Equipment used: Rolling walker (2 wheeled) Transfers: Sit to/from Stand Sit to Stand: Min guard         General transfer comment: verbal cues for technique for safety. no headache reported with  activity  Ambulation/Gait Ambulation/Gait assistance: Min guard Gait Distance (Feet): 175 Feet Assistive device: Rolling walker (2 wheeled) Gait Pattern/deviations: Step-through pattern Gait velocity: decreased   General Gait Details: no loss of balance with ambulation. occasional cues for rolling walker placement for safety  Stairs            Wheelchair Mobility    Modified Rankin (Stroke Patients Only)       Balance Overall balance assessment: Needs assistance Sitting-balance support: No upper extremity supported;Feet supported Sitting balance-Leahy Scale: Good     Standing balance support: No upper extremity supported Standing balance-Leahy Scale: Fair                               Pertinent Vitals/Pain Pain Assessment: No/denies pain    Home Living Family/patient expects to be discharged to:: Private residence Living Arrangements: Children Available Help at Discharge: Friend(s);Neighbor Type of Home: House Home Access: Stairs to enter Entrance Stairs-Rails:  (banister) Technical brewer of Steps: 1 Home Layout: One level Home Equipment: None      Prior Function Level of Independence: Independent Mod I with rolling walker for ambulation               Hand Dominance        Extremity/Trunk Assessment   Upper Extremity Assessment Upper Extremity Assessment: Overall WFL for tasks assessed    Lower Extremity Assessment Lower Extremity Assessment: Generalized weakness       Communication   Communication: No difficulties  Cognition Arousal/Alertness: Awake/alert Behavior During Therapy: WFL for tasks assessed/performed Overall Cognitive Status: No family/caregiver present to determine baseline cognitive functioning  General Comments: patient reports she saw ants on the floor and that she has hallucinated before. patient able to follow commands with extra time.      General  Comments General comments (skin integrity, edema, etc.): handout provided for general spine precautions and movement to avoid, logroll technique    Exercises Other Exercises Other Exercises: Pt educated re: OT role, DME recs, d/c recs, falls prevention, ECS, HEP, log rolling tehcnique, back precautions Other Exercises: LBD, toileting, rolling, sit<>stand x2, grooming   Assessment/Plan    PT Assessment Patient needs continued PT services  PT Problem List Decreased strength;Decreased activity tolerance;Decreased balance;Decreased mobility;Decreased safety awareness;Decreased knowledge of use of DME       PT Treatment Interventions DME instruction;Stair training;Gait training;Functional mobility training;Therapeutic activities;Therapeutic exercise;Balance training;Neuromuscular re-education    PT Goals (Current goals can be found in the Care Plan section)  Acute Rehab PT Goals Patient Stated Goal: to go home PT Goal Formulation: With patient Time For Goal Achievement: 07/16/21 Potential to Achieve Goals: Good    Frequency 7X/week   Barriers to discharge        Co-evaluation PT/OT/SLP Co-Evaluation/Treatment: Yes Reason for Co-Treatment: Necessary to address cognition/behavior during functional activity PT goals addressed during session: Mobility/safety with mobility OT goals addressed during session: ADL's and self-care       AM-PAC PT "6 Clicks" Mobility  Outcome Measure Help needed turning from your back to your side while in a flat bed without using bedrails?: A Little Help needed moving from lying on your back to sitting on the side of a flat bed without using bedrails?: A Little Help needed moving to and from a bed to a chair (including a wheelchair)?: A Little Help needed standing up from a chair using your arms (e.g., wheelchair or bedside chair)?: A Little Help needed to walk in hospital room?: A Little Help needed climbing 3-5 steps with a railing? : A Little 6  Click Score: 18    End of Session Equipment Utilized During Treatment: Gait belt Activity Tolerance: Patient tolerated treatment well;No increased pain Patient left: in chair;with call bell/phone within reach;with chair alarm set Nurse Communication: Mobility status PT Visit Diagnosis: Unsteadiness on feet (R26.81);Muscle weakness (generalized) (M62.81)    Time: 1358 (also in the room from 13:14-13:26)-1421 PT Time Calculation (min) (ACUTE ONLY): 23 min   Charges:   PT Evaluation $PT Eval Moderate Complexity: 1 Mod PT Treatments $Therapeutic Activity: 8-22 mins        Minna Merritts, PT, MPT   Percell Locus 07/02/2021, 3:39 PM

## 2021-07-02 NOTE — Progress Notes (Signed)
Patient hallucinating. Per daughter the last time patient took oxycodone, she was hallucinating. Dr. Izora Ribas notified. Hold Narcotics for now per MD request.

## 2021-07-02 NOTE — TOC Progression Note (Signed)
Transition of Care [facility]) - Progression Note    Patient Details  Name: Latoya Freeman MRN: [id number] Date of Birth: February 19, 1941  Transition [facility] CM/SW Contact  Su Hilt, RN Phone Number: 07/02/2021, 3:33 PM  Clinical Narrative:      Spoke with the daughter Freda Munro, She lives at home alone, Her daughter will stay with her for a few dys to make sure she is ok and has what she needs, She has DME at home including Shower seat, BSC, Rolator, RW, standard walker, cane, Grabbers and grab bars, she has used HH in the past and had [facility], the daughter stated that They are good to use again if they can accept the patient, I reached out to [place] [facility] and asked if they can accept the patient back, She has transportation and can afford her medications     Expected Discharge Plan and Services                                                 Social Determinants of Health (SDOH) Interventions    Readmission Risk Interventions No flowsheet data found.

## 2021-07-02 NOTE — Discharge Summary (Addendum)
Physician Discharge Summary  Patient ID: Latoya Freeman MRN: [id number] DOB/AGE: 23-Dec-1940 80 y.o.  Admit date: 07/01/2021 Discharge date: 07/02/2021  Admission Diagnoses: Lumbar neurogenic claudication with radiculopathy   Discharge Diagnoses:  Active Problems:   Lumbar radiculopathy, chronic   Discharged Condition: good  Hospital Course: Latoya Freeman is a 80 year old female is hospitalized for lumbar decompression.  Her postoperative course was complicated by a mild adverse reaction to oxycodone which resolved with medication changes.  She did suffer a CSF leak intraoperatively however this was closed primarily and she was kept on bedrest for 24 hours.  After which she resumed activities without positional headache.  Her incision remained clean dry and intact with sutures in place.  She reported resolution of her left flank pain and her postoperative pain was controlled with oral medications. She was deemed by physical therapy and occupational external and therapy and deemed appropriate for discharge home with home health PT and OT. She was discharged home on POD1.  Consults:  PT and OT  Treatments: IV hydration and surgery:  L4-S1 lumbar decompression, central laminectomy, bilateral medial facetectomies and foraminotomies. Left L3-4 microdiscectomy   Discharge Exam: Blood pressure 103/64, pulse 75, temperature 98.2 F (36.8 C), temperature source Oral, resp. rate 16, height 5\' 1"  (1.549 m), weight 88.5 kg, SpO2 94 %. AA Ox3 CNI Strength:5/5 throughout BLE Incision with scant serosanguinous drainage on bandage.  Disposition: Discharge disposition: 01-Home or Self Care      Discharge Instructions     Incentive spirometry RT   Complete by: As directed    No dressing needed   Complete by: As directed       Allergies as of 07/02/2021       Reactions   Bee Venom Swelling   Shellfish Allergy Swelling   Tamoxifen Hives   Penicillins Hives, Rash, Other (See Comments)    Has patient had a PCN reaction causing immediate rash, facial/tongue/throat swelling, SOB or lightheadedness with hypotension: Unknown Has patient had a PCN reaction causing severe rash involving mucus membranes or skin necrosis: Unknown Has patient had a PCN reaction that required hospitalization: Unknown Has patient had a PCN reaction occurring within the last 10 years: No If all of the above answers are "NO", then may proceed with Cephalosporin use.        Medication List     STOP taking these medications    acetaminophen 500 MG tablet Commonly known as: TYLENOL   clopidogrel 75 MG tablet Commonly known as: PLAVIX   Eliquis 5 MG Tabs tablet Generic drug: apixaban   traMADol 50 MG tablet Commonly known as: ULTRAM       TAKE these medications    amLODipine 5 MG tablet Commonly known as: NORVASC Take 7.5 mg by mouth daily.   bisacodyl 5 MG EC tablet Commonly known as: DULCOLAX Take 1 tablet (5 mg total) by mouth daily as needed for moderate constipation.   celecoxib 200 MG capsule Commonly known as: CELEBREX Take 1 capsule (200 mg total) by mouth every 12 (twelve) hours.   cetirizine 10 MG tablet Commonly known as: ZYRTEC Take 10 mg by mouth daily as needed for allergies.   citalopram 20 MG tablet Commonly known as: CELEXA Take 20 mg by mouth daily.   HYDROcodone-acetaminophen 5-325 MG tablet Commonly known as: NORCO/VICODIN Take 1 tablet by mouth every 8 (eight) hours as needed for up to 5 days for severe pain.   levothyroxine 75 MCG tablet Commonly known as: SYNTHROID Take 75 mcg  by mouth daily.   losartan 50 MG tablet Commonly known as: COZAAR Take 50 mg by mouth daily.   omeprazole 20 MG capsule Commonly known as: PRILOSEC Take 20 mg by mouth 2 (two) times daily.   senna 8.6 MG Tabs tablet Commonly known as: SENOKOT Take 1 tablet (8.6 mg total) by mouth 2 (two) times daily.   triamterene-hydrochlorothiazide 37.5-25 MG tablet Commonly known  as: MAXZIDE-25 Take 1 tablet by mouth daily.               Discharge Care Instructions  (From admission, onward)           Start     Ordered   07/02/21 0000  No dressing needed        07/02/21 1653            Follow-up Information     Loleta Dicker, PA Follow up.   Why: F/u in 2 weeks for wound check and suture removal. This appointment should already be scheduled at [facility] information: [place] [address] [phone]                 Signed: Loleta Dicker 07/02/2021, 4:56 PM

## 2021-07-02 NOTE — Evaluation (Addendum)
Occupational Therapy Evaluation Patient Details Name: Latoya Freeman MRN: [id number] DOB: [birth year] Today's Date: 07/02/2021    History of Present Illness Latoya Freeman is a 80yo female with PMH of HTN, renal mass, brain aneurym. Now s/p L4-S1 lumbar decompression, central laminectomy, bilateral medial facetectomies and foraminotomies. Left L3-4 microdiscectomy.   Clinical Impression   [name] was seen for OT evaluation this date. Prior to hospital admission, pt was MOD I for mobility using RW and ADLs, daughter assists with meals. Pt lives alone with daughter available PRN. Pt presents to acute OT demonstrating impaired ADL performance and functional mobility 2/2 decreased activity tolerance and decreased LB access. Pt oriented to self and situation, requires cues for location. Pt stating there are ants on the floor and requires cues to correct.  Pt requires CGA + RW for toilet t/f and hand washing standing sinkside. MOD A for LB access seated EOB. Pt would benefit from skilled OT to address noted impairments and functional limitations (see below for any additional details) in order to maximize safety. Upon hospital discharge, recommend HHOT to maximize pt safety and return to functional independence during meaningful occupations of daily life.     Follow Up Recommendations  Home health OT;Supervision - Intermittent    Equipment Recommendations  3 in 1 bedside commode    Recommendations for Other Services       Precautions / Restrictions Precautions Precautions: Fall;Back Precaution Booklet Issued: Yes (comment) Restrictions Weight Bearing Restrictions: No      Mobility Bed Mobility Overal bed mobility: Needs Assistance Bed Mobility: Sidelying to Sit;Rolling Rolling: Min assist;+2 for physical assistance Sidelying to sit: Min assist;+2 for physical assistance            Transfers Overall transfer level: Needs assistance Equipment used: Rolling walker (2  wheeled) Transfers: Sit to/from Stand Sit to Stand: Min guard              Balance Overall balance assessment: Needs assistance Sitting-balance support: No upper extremity supported;Feet supported Sitting balance-Leahy Scale: Good     Standing balance support: No upper extremity supported;During functional activity Standing balance-Leahy Scale: Fair                             ADL either performed or assessed with clinical judgement   ADL Overall ADL's : Needs assistance/impaired                                       General ADL Comments: CGA + RW for toilet t/f and hand washing standing sinkside. MOD A for LB access seated EOB      Pertinent Vitals/Pain Pain Assessment: No/denies pain     Hand Dominance     Extremity/Trunk Assessment Upper Extremity Assessment Upper Extremity Assessment: Overall WFL for tasks assessed   Lower Extremity Assessment Lower Extremity Assessment: Generalized weakness       Communication Communication Communication: No difficulties   Cognition Arousal/Alertness: Awake/alert Behavior During Therapy: WFL for tasks assessed/performed Overall Cognitive Status: No family/caregiver present to determine baseline cognitive functioning                                 General Comments: Pt oriented to self and situation, requires cues for place. Pt stating there are ants on the floor and  requires cues to correct.   General Comments  surgical site c/d/i    Exercises Exercises: Other exercises Other Exercises Other Exercises: Pt educated re: OT role, DME recs, d/c recs, falls prevention, ECS, HEP, log rolling tehcnique, back precautions Other Exercises: LBD, toileting, rolling, sit<>stand x2, grooming   Shoulder Instructions      Home Living Family/patient expects to be discharged to:: Private residence Living Arrangements: Children Available Help at Discharge: Friend(s);Neighbor Type of Home:  House Home Access: Stairs to enter Technical brewer of Steps: 1 Entrance Stairs-Rails:  (banister) Home Layout: One level     Bathroom Shower/Tub: Aeronautical engineer: None          Prior Functioning/Environment Level of Independence: Independent                 OT Problem List: Decreased range of motion;Decreased activity tolerance;Decreased safety awareness;Decreased knowledge of use of DME or AE      OT Treatment/Interventions: Self-care/ADL training;Therapeutic exercise;Energy conservation;DME and/or AE instruction;Therapeutic activities;Patient/family education;Balance training    OT Goals(Current goals can be found in the care plan section) Acute Rehab OT Goals Patient Stated Goal: to go home OT Goal Formulation: With patient Time For Goal Achievement: 07/16/21 Potential to Achieve Goals: Good ADL Goals Pt Will Perform Grooming: Independently;standing Pt Will Perform Lower Body Dressing: with supervision;with adaptive equipment;sit to/from stand (c LRAD PRN) Pt Will Transfer to Toilet: with modified independence;ambulating;regular height toilet (c LRAD PRN)  OT Frequency: Min 1X/week           Co-evaluation PT/OT/SLP Co-Evaluation/Treatment: Yes Reason for Co-Treatment: Necessary to address cognition/behavior during functional activity PT goals addressed during session: Mobility/safety with mobility OT goals addressed during session: ADL's and self-care      AM-PAC OT "6 Clicks" Daily Activity     Outcome Measure Help from another person eating meals?: None Help from another person taking care of personal grooming?: A Little Help from another person toileting, which includes using toliet, bedpan, or urinal?: A Little Help from another person bathing (including washing, rinsing, drying)?: A Little Help from another person to put on and taking off regular upper body clothing?: None Help from another person to put on and taking  off regular lower body clothing?: A Little 6 Click Score: 20   End of Session Equipment Utilized During Treatment: Rolling walker  Activity Tolerance: Patient tolerated treatment well Patient left: in chair;with call bell/phone within reach;with chair alarm set  OT Visit Diagnosis: Other abnormalities of gait and mobility (R26.89)                Time: 2122-4825 (Also 0037-0488) OT Time Calculation (min): 20 min Charges:  OT General Charges $OT Visit: 1 Visit OT Evaluation $OT Eval Low Complexity: 1 Low OT Treatments $Self Care/Home Management : 8-22 mins  Dessie Coma, M.S. OTR/L  07/02/21, 3:06 PM  ascom [phone]

## 2021-07-02 NOTE — Progress Notes (Signed)
OT Cancellation Note  Patient Details Name: Latoya Freeman MRN: [id number] DOB: 03-22-1941   Cancelled Treatment:    Reason Eval/Treat Not Completed: Active bedrest order. Order received and chart reviewed. Pt noted to have strict bed rest orders in expiring 07/03/21 at 11am. MD notified to request clarification of orders, pending response. Will hold at this time and follow up as able to initiate services as pt appropriate.   Dessie Coma, M.S. OTR/L  07/02/21, 1:34 PM  ascom [phone]

## 2021-07-02 NOTE — Discharge Instructions (Addendum)
NEUROSURGERY DISCHARGE INSTRUCTIONS  Admission diagnosis: Lumbar radiculopathy, chronic [M54.16]  Operative procedure: L4-S1 lumbar decompression including central laminectomy and bilateral medial facetectomies including foraminotomies. Left L3-4 microdiscectomy  What to do after you leave the hospital:  Recommended diet: regular diet. Increase protein intake to promote wound healing.  Recommended activity: no lifting, driving, or strenuous exercise for 6 weeks . No driving for 6 weeks.You should walk multiple times per day  Special Instructions  No straining, no heavy lifting > 10lbs x 4 weeks.  Keep incision area clean and dry. May shower starting tomorrow. No baths or pools for 6 weeks.  Please remove dressing tomorrow, no need to apply a bandage afterwards  You have sutures or staples that will be removed in clinic.  Please take pain medications as directed. Take a stool softener if on pain medications   Please Report any of the following: Nausea or Vomiting, Temperature is greater than 101.59F (38.1C) degrees, Dizziness, Abdominal Pain, Difficulty Breathing or Shortness of Breath, Inability to Eat, drink Fluids, or Take medications, Bleeding, swelling, or drainage from surgical incision sites, New numbness or weakness, and Bowel or bladder dysfunction to the neurosurgeon on call at [phone]  Additional Follow up appointments Please follow up with Cooper Render PA-C in [facility] as scheduled in 2-3 weeks   Please see below for scheduled appointments:  Future Appointments  Date Time Provider [place]  08/28/2021  2:30 PM Sindy Guadeloupe, MD [facility] None

## 2021-07-02 NOTE — Progress Notes (Signed)
Discharge went over with patient and daughter with opportunity to ask questions. All belongings packed up. Home Health PT set up for patient. Family notified. IV removed. Prescriptions electronically sent to pharmacy. IV removed.

## 2021-07-02 NOTE — Progress Notes (Addendum)
Progress Note  History: JANANN BOEVE is POD1 from L4-S1 lumbar decompression, central laminectomy, bilateral medial facetectomies and foraminotomies. Left L3-4 microdiscectomy   POD1: She reports she is doing well without headaches. She has remained flat overnight due to interoperative CSF leak. Reports resolution of left leg pain.   Physical Exam: Vitals:   07/02/21 0419 07/02/21 0809  BP: (!) 126/57 (!) 128/54  Pulse: 77 76  Resp: 20 18  Temp: 98.2 F (36.8 C) 98.6 F (37 C)  SpO2: 96% 97%    AA Ox3 CNI Strength:5/5 throughout Incision with scant serosanguinous drainage on bandage.  Data:  Recent Labs  Lab 07/01/21 0815  NA 140  K 3.4*  CL 102  BUN 12  CREATININE 1.30*  GLUCOSE 95   No results for input(s): AST, ALT, ALKPHOS in the last 168 hours.  Invalid input(s): TBILI   Recent Labs  Lab 07/01/21 0815  HGB 13.3  HCT 39.0   No results for input(s): APTT, INR in the last 168 hours.       Other tests/results: none  Assessment/Plan:  TYFFANI FOGLESONG is a 80 y.o female POD1 from lumbar decompression. She reports resolution of her pre-op leg pain thus far.   - mobilize - pain control - DVT prophylaxis; SCDs and subq heparin  - PT/OT to evaluate this afternoon for any home needs - flat until 11am. If pt develops positional headache please lay flat and page neurosurgery.  - Louann  Department of Neurosurgery

## 2021-07-03 LAB — BPAM RBC
Blood Product Expiration Date: 202208192359
Blood Product Expiration Date: 202208212359
Unit Type and Rh: 5100
Unit Type and Rh: 5100

## 2021-07-03 LAB — TYPE AND SCREEN
ABO/RH(D): O NEG
Antibody Screen: NEGATIVE
Unit division: 0
Unit division: 0

## 2021-07-12 ENCOUNTER — Emergency Department: Payer: Medicare HMO

## 2021-07-12 ENCOUNTER — Inpatient Hospital Stay: Payer: Medicare HMO

## 2021-07-12 ENCOUNTER — Encounter: Payer: Self-pay | Admitting: Emergency Medicine

## 2021-07-12 ENCOUNTER — Inpatient Hospital Stay
Admission: EM | Admit: 2021-07-12 | Discharge: 2021-07-21 | DRG: 095 | Disposition: A | Discharge status: Skilled Nursing Facility | Payer: Medicare HMO | Attending: Internal Medicine | Admitting: Internal Medicine

## 2021-07-12 ENCOUNTER — Other Ambulatory Visit: Payer: Self-pay

## 2021-07-12 DIAGNOSIS — N1831 Chronic kidney disease, stage 3a: Secondary | ICD-10-CM | POA: Diagnosis present

## 2021-07-12 DIAGNOSIS — D72829 Elevated white blood cell count, unspecified: Secondary | ICD-10-CM

## 2021-07-12 DIAGNOSIS — Z515 Encounter for palliative care: Secondary | ICD-10-CM | POA: Diagnosis not present

## 2021-07-12 DIAGNOSIS — M5126 Other intervertebral disc displacement, lumbar region: Secondary | ICD-10-CM | POA: Diagnosis not present

## 2021-07-12 DIAGNOSIS — G009 Bacterial meningitis, unspecified: Secondary | ICD-10-CM | POA: Diagnosis not present

## 2021-07-12 DIAGNOSIS — R300 Dysuria: Secondary | ICD-10-CM | POA: Diagnosis present

## 2021-07-12 DIAGNOSIS — F32A Depression, unspecified: Secondary | ICD-10-CM | POA: Diagnosis not present

## 2021-07-12 DIAGNOSIS — Z7901 Long term (current) use of anticoagulants: Secondary | ICD-10-CM

## 2021-07-12 DIAGNOSIS — I129 Hypertensive chronic kidney disease with stage 1 through stage 4 chronic kidney disease, or unspecified chronic kidney disease: Secondary | ICD-10-CM | POA: Diagnosis not present

## 2021-07-12 DIAGNOSIS — R5381 Other malaise: Secondary | ICD-10-CM | POA: Diagnosis not present

## 2021-07-12 DIAGNOSIS — K219 Gastro-esophageal reflux disease without esophagitis: Secondary | ICD-10-CM | POA: Diagnosis present

## 2021-07-12 DIAGNOSIS — Z981 Arthrodesis status: Secondary | ICD-10-CM | POA: Diagnosis not present

## 2021-07-12 DIAGNOSIS — T502X5A Adverse effect of carbonic-anhydrase inhibitors, benzothiadiazides and other diuretics, initial encounter: Secondary | ICD-10-CM | POA: Diagnosis present

## 2021-07-12 DIAGNOSIS — N183 Chronic kidney disease, stage 3 unspecified: Secondary | ICD-10-CM

## 2021-07-12 DIAGNOSIS — Z79899 Other long term (current) drug therapy: Secondary | ICD-10-CM | POA: Diagnosis not present

## 2021-07-12 DIAGNOSIS — E876 Hypokalemia: Secondary | ICD-10-CM | POA: Diagnosis not present

## 2021-07-12 DIAGNOSIS — R531 Weakness: Secondary | ICD-10-CM | POA: Diagnosis not present

## 2021-07-12 DIAGNOSIS — Z7401 Bed confinement status: Secondary | ICD-10-CM | POA: Diagnosis not present

## 2021-07-12 DIAGNOSIS — R0602 Shortness of breath: Secondary | ICD-10-CM | POA: Diagnosis not present

## 2021-07-12 DIAGNOSIS — Z6836 Body mass index (BMI) 36.0-36.9, adult: Secondary | ICD-10-CM

## 2021-07-12 DIAGNOSIS — E039 Hypothyroidism, unspecified: Secondary | ICD-10-CM | POA: Diagnosis not present

## 2021-07-12 DIAGNOSIS — E079 Disorder of thyroid, unspecified: Secondary | ICD-10-CM | POA: Diagnosis not present

## 2021-07-12 DIAGNOSIS — B9689 Other specified bacterial agents as the cause of diseases classified elsewhere: Secondary | ICD-10-CM | POA: Diagnosis present

## 2021-07-12 DIAGNOSIS — M6281 Muscle weakness (generalized): Secondary | ICD-10-CM | POA: Diagnosis not present

## 2021-07-12 DIAGNOSIS — Z20822 Contact with and (suspected) exposure to covid-19: Secondary | ICD-10-CM | POA: Diagnosis not present

## 2021-07-12 DIAGNOSIS — Z86711 Personal history of pulmonary embolism: Secondary | ICD-10-CM | POA: Diagnosis not present

## 2021-07-12 DIAGNOSIS — Z66 Do not resuscitate: Secondary | ICD-10-CM | POA: Diagnosis not present

## 2021-07-12 DIAGNOSIS — G9782 Other postprocedural complications and disorders of nervous system: Secondary | ICD-10-CM | POA: Diagnosis not present

## 2021-07-12 DIAGNOSIS — G038 Meningitis due to other specified causes: Secondary | ICD-10-CM | POA: Diagnosis not present

## 2021-07-12 DIAGNOSIS — E860 Dehydration: Secondary | ICD-10-CM | POA: Diagnosis present

## 2021-07-12 DIAGNOSIS — R519 Headache, unspecified: Secondary | ICD-10-CM | POA: Diagnosis present

## 2021-07-12 DIAGNOSIS — E669 Obesity, unspecified: Secondary | ICD-10-CM | POA: Diagnosis not present

## 2021-07-12 DIAGNOSIS — Z9049 Acquired absence of other specified parts of digestive tract: Secondary | ICD-10-CM | POA: Diagnosis not present

## 2021-07-12 DIAGNOSIS — M436 Torticollis: Secondary | ICD-10-CM | POA: Diagnosis present

## 2021-07-12 DIAGNOSIS — N179 Acute kidney failure, unspecified: Secondary | ICD-10-CM | POA: Diagnosis not present

## 2021-07-12 DIAGNOSIS — Z87891 Personal history of nicotine dependence: Secondary | ICD-10-CM

## 2021-07-12 DIAGNOSIS — Z7902 Long term (current) use of antithrombotics/antiplatelets: Secondary | ICD-10-CM

## 2021-07-12 DIAGNOSIS — G008 Other bacterial meningitis: Principal | ICD-10-CM | POA: Diagnosis present

## 2021-07-12 DIAGNOSIS — I10 Essential (primary) hypertension: Secondary | ICD-10-CM | POA: Diagnosis not present

## 2021-07-12 DIAGNOSIS — Z86010 Personal history of colonic polyps: Secondary | ICD-10-CM | POA: Diagnosis not present

## 2021-07-12 DIAGNOSIS — G9340 Encephalopathy, unspecified: Secondary | ICD-10-CM | POA: Diagnosis not present

## 2021-07-12 DIAGNOSIS — M4326 Fusion of spine, lumbar region: Secondary | ICD-10-CM | POA: Diagnosis not present

## 2021-07-12 DIAGNOSIS — Z79890 Hormone replacement therapy (postmenopausal): Secondary | ICD-10-CM

## 2021-07-12 DIAGNOSIS — M4319 Spondylolisthesis, multiple sites in spine: Secondary | ICD-10-CM | POA: Diagnosis not present

## 2021-07-12 DIAGNOSIS — R42 Dizziness and giddiness: Secondary | ICD-10-CM | POA: Diagnosis not present

## 2021-07-12 DIAGNOSIS — Y95 Nosocomial condition: Secondary | ICD-10-CM | POA: Diagnosis present

## 2021-07-12 DIAGNOSIS — Z91030 Bee allergy status: Secondary | ICD-10-CM

## 2021-07-12 DIAGNOSIS — Z880 Allergy status to penicillin: Secondary | ICD-10-CM

## 2021-07-12 DIAGNOSIS — Z888 Allergy status to other drugs, medicaments and biological substances status: Secondary | ICD-10-CM

## 2021-07-12 DIAGNOSIS — Z8679 Personal history of other diseases of the circulatory system: Secondary | ICD-10-CM

## 2021-07-12 DIAGNOSIS — Z91013 Allergy to seafood: Secondary | ICD-10-CM

## 2021-07-12 LAB — CBC WITH DIFFERENTIAL/PLATELET
Abs Immature Granulocytes: 0.14 10*3/uL — ABNORMAL HIGH (ref 0.00–0.07)
Basophils Absolute: 0.1 10*3/uL (ref 0.0–0.1)
Basophils Relative: 0 %
Eosinophils Absolute: 0.2 10*3/uL (ref 0.0–0.5)
Eosinophils Relative: 1 %
HCT: 38.5 % (ref 36.0–46.0)
Hemoglobin: 13.7 g/dL (ref 12.0–15.0)
Immature Granulocytes: 1 %
Lymphocytes Relative: 14 %
Lymphs Abs: 2.8 10*3/uL (ref 0.7–4.0)
MCH: 31.5 pg (ref 26.0–34.0)
MCHC: 35.6 g/dL (ref 30.0–36.0)
MCV: 88.5 fL (ref 80.0–100.0)
Monocytes Absolute: 1.1 10*3/uL — ABNORMAL HIGH (ref 0.1–1.0)
Monocytes Relative: 5 %
Neutro Abs: 16.4 10*3/uL — ABNORMAL HIGH (ref 1.7–7.7)
Neutrophils Relative %: 79 %
Platelets: 329 10*3/uL (ref 150–400)
RBC: 4.35 MIL/uL (ref 3.87–5.11)
RDW: 12.2 % (ref 11.5–15.5)
WBC: 20.7 10*3/uL — ABNORMAL HIGH (ref 4.0–10.5)
nRBC: 0 % (ref 0.0–0.2)

## 2021-07-12 LAB — COMPREHENSIVE METABOLIC PANEL
ALT: 12 U/L (ref 0–44)
AST: 20 U/L (ref 15–41)
Albumin: 4 g/dL (ref 3.5–5.0)
Alkaline Phosphatase: 53 U/L (ref 38–126)
Anion gap: 11 (ref 5–15)
BUN: 25 mg/dL — ABNORMAL HIGH (ref 8–23)
CO2: 22 mmol/L (ref 22–32)
Calcium: 10.2 mg/dL (ref 8.9–10.3)
Chloride: 105 mmol/L (ref 98–111)
Creatinine, Ser: 1.63 mg/dL — ABNORMAL HIGH (ref 0.44–1.00)
GFR, Estimated: 32 mL/min — ABNORMAL LOW (ref >60)
Glucose, Bld: 133 mg/dL — ABNORMAL HIGH (ref 70–99)
Potassium: 2.8 mmol/L — ABNORMAL LOW (ref 3.5–5.1)
Sodium: 138 mmol/L (ref 135–145)
Total Bilirubin: 0.9 mg/dL (ref 0.3–1.2)
Total Protein: 7.2 g/dL (ref 6.5–8.1)

## 2021-07-12 LAB — TROPONIN I (HIGH SENSITIVITY)
Troponin I (High Sensitivity): 7 ng/L (ref <18)
Troponin I (High Sensitivity): 6 ng/L (ref <18)

## 2021-07-12 LAB — CSF CELL COUNT WITH DIFFERENTIAL
Eosinophils, CSF: 0 %
Lymphs, CSF: 5 %
Monocyte-Macrophage-Spinal Fluid: 0 %
RBC Count, CSF: 8877 /mm3 — ABNORMAL HIGH (ref 0–3)
Segmented Neutrophils-CSF: 95 %
Tube #: 2
WBC, CSF: 14350 /mm3 (ref 0–5)

## 2021-07-12 LAB — RESP PANEL BY RT-PCR (FLU A&B, COVID) ARPGX2
Influenza A by PCR: NEGATIVE
Influenza B by PCR: NEGATIVE
SARS Coronavirus 2 by RT PCR: NEGATIVE

## 2021-07-12 LAB — MAGNESIUM: Magnesium: 1.9 mg/dL (ref 1.7–2.4)

## 2021-07-12 LAB — CBG MONITORING, ED: Glucose-Capillary: 132 mg/dL — ABNORMAL HIGH (ref 70–99)

## 2021-07-12 LAB — PROTIME-INR
INR: 1 (ref 0.8–1.2)
Prothrombin Time: 13.3 seconds (ref 11.4–15.2)

## 2021-07-12 LAB — GLUCOSE, CSF: Glucose, CSF: <20 mg/dL — CL (ref 40–70)

## 2021-07-12 LAB — PROTEIN, CSF: Total  Protein, CSF: >600 mg/dL — ABNORMAL HIGH (ref 15–45)

## 2021-07-12 MED ORDER — SODIUM CHLORIDE 0.9 % IV SOLN
2.0000 g | Freq: Once | INTRAVENOUS | Status: AC
  Administered 2021-07-12: 2 g via INTRAVENOUS
  Filled 2021-07-12: qty 20

## 2021-07-12 MED ORDER — BISACODYL 5 MG PO TBEC: 5.0000 mg | DELAYED_RELEASE_TABLET | Freq: Every day | ORAL | Status: DC | PRN

## 2021-07-12 MED ORDER — MORPHINE SULFATE (PF) 4 MG/ML IV SOLN
4.0000 mg | Freq: Once | INTRAVENOUS | Status: AC
  Administered 2021-07-12: 4 mg via INTRAVENOUS
  Filled 2021-07-12: qty 1

## 2021-07-12 MED ORDER — CITALOPRAM HYDROBROMIDE 20 MG PO TABS
20.0000 mg | ORAL_TABLET | Freq: Every day | ORAL | Status: DC
  Administered 2021-07-13 – 2021-07-21 (×9): 20 mg via ORAL
  Filled 2021-07-12 (×9): qty 1

## 2021-07-12 MED ORDER — PANTOPRAZOLE SODIUM 40 MG IV SOLR
40.0000 mg | INTRAVENOUS | Status: DC
  Administered 2021-07-12 – 2021-07-16 (×5): 40 mg via INTRAVENOUS
  Filled 2021-07-12 (×5): qty 40

## 2021-07-12 MED ORDER — SODIUM CHLORIDE 0.9 % IV SOLN: 2.0000 g | Freq: Two times a day (BID) | INTRAVENOUS | Status: DC

## 2021-07-12 MED ORDER — ONDANSETRON HCL 4 MG PO TABS: 4.0000 mg | ORAL_TABLET | Freq: Four times a day (QID) | ORAL | Status: DC | PRN

## 2021-07-12 MED ORDER — DEXAMETHASONE SODIUM PHOSPHATE 10 MG/ML IJ SOLN
10.0000 mg | Freq: Once | INTRAMUSCULAR | Status: AC
  Administered 2021-07-12: 10 mg via INTRAVENOUS
  Filled 2021-07-12: qty 1

## 2021-07-12 MED ORDER — LIDOCAINE-EPINEPHRINE 2 %-1:100000 IJ SOLN
20.0000 mL | Freq: Once | INTRAMUSCULAR | Status: AC
  Administered 2021-07-12: 20 mL via INTRADERMAL
  Filled 2021-07-12: qty 1

## 2021-07-12 MED ORDER — POTASSIUM CHLORIDE 10 MEQ/100ML IV SOLN
10.0000 meq | INTRAVENOUS | Status: AC
  Administered 2021-07-12 (×3): 10 meq via INTRAVENOUS
  Filled 2021-07-12 (×3): qty 100

## 2021-07-12 MED ORDER — POTASSIUM CHLORIDE ER 10 MEQ PO TBCR
10.0000 meq | EXTENDED_RELEASE_TABLET | Freq: Every day | ORAL | Status: DC
  Administered 2021-07-13: 10 meq via ORAL
  Filled 2021-07-12 (×3): qty 1

## 2021-07-12 MED ORDER — LEVOTHYROXINE SODIUM 50 MCG PO TABS
75.0000 ug | ORAL_TABLET | Freq: Every day | ORAL | Status: DC
  Administered 2021-07-13 – 2021-07-21 (×9): 75 ug via ORAL
  Filled 2021-07-12: qty 2
  Filled 2021-07-12: qty 1
  Filled 2021-07-12 (×7): qty 2

## 2021-07-12 MED ORDER — ACETAMINOPHEN 325 MG PO TABS
650.0000 mg | ORAL_TABLET | Freq: Four times a day (QID) | ORAL | Status: DC | PRN
  Administered 2021-07-14 – 2021-07-17 (×3): 650 mg via ORAL
  Filled 2021-07-12 (×5): qty 2

## 2021-07-12 MED ORDER — PANTOPRAZOLE SODIUM 40 MG PO TBEC: 40.0000 mg | DELAYED_RELEASE_TABLET | Freq: Every day | ORAL | Status: DC

## 2021-07-12 MED ORDER — SODIUM CHLORIDE 0.9 % IV SOLN
2.0000 g | Freq: Two times a day (BID) | INTRAVENOUS | Status: AC
  Administered 2021-07-12 – 2021-07-15 (×7): 2 g via INTRAVENOUS
  Filled 2021-07-12 (×10): qty 2

## 2021-07-12 MED ORDER — SODIUM CHLORIDE 0.9 % IV BOLUS
1000.0000 mL | Freq: Once | INTRAVENOUS | Status: AC
  Administered 2021-07-12: 1000 mL via INTRAVENOUS

## 2021-07-12 MED ORDER — ONDANSETRON HCL 4 MG/2ML IJ SOLN
4.0000 mg | Freq: Once | INTRAMUSCULAR | Status: AC
  Administered 2021-07-12: 4 mg via INTRAVENOUS
  Filled 2021-07-12: qty 2

## 2021-07-12 MED ORDER — SENNA 8.6 MG PO TABS
1.0000 | ORAL_TABLET | Freq: Two times a day (BID) | ORAL | Status: DC
  Administered 2021-07-13 – 2021-07-21 (×16): 8.6 mg via ORAL
  Filled 2021-07-12 (×17): qty 1

## 2021-07-12 MED ORDER — ACETAMINOPHEN 650 MG RE SUPP: 650.0000 mg | Freq: Four times a day (QID) | RECTAL | Status: DC | PRN

## 2021-07-12 MED ORDER — LIDOCAINE HCL (PF) 1 % IJ SOLN
5.0000 mL | Freq: Once | INTRAMUSCULAR | Status: AC
  Administered 2021-07-12: 5 mL

## 2021-07-12 MED ORDER — POTASSIUM CHLORIDE CRYS ER 20 MEQ PO TBCR
40.0000 meq | EXTENDED_RELEASE_TABLET | Freq: Once | ORAL | Status: AC
  Administered 2021-07-12: 40 meq via ORAL
  Filled 2021-07-12: qty 2

## 2021-07-12 MED ORDER — CELECOXIB 200 MG PO CAPS: 200.0000 mg | ORAL_CAPSULE | Freq: Two times a day (BID) | ORAL | Status: DC

## 2021-07-12 MED ORDER — HYDROCODONE-ACETAMINOPHEN 5-325 MG PO TABS
1.0000 | ORAL_TABLET | Freq: Four times a day (QID) | ORAL | Status: DC | PRN
  Administered 2021-07-13 – 2021-07-16 (×6): 1 via ORAL
  Filled 2021-07-12 (×7): qty 1

## 2021-07-12 MED ORDER — POTASSIUM CHLORIDE IN NACL 40-0.9 MEQ/L-% IV SOLN
INTRAVENOUS | Status: DC
  Filled 2021-07-12 (×2): qty 1000

## 2021-07-12 MED ORDER — LORATADINE 10 MG PO TABS
10.0000 mg | ORAL_TABLET | Freq: Every day | ORAL | Status: DC
  Administered 2021-07-13 – 2021-07-21 (×9): 10 mg via ORAL
  Filled 2021-07-12 (×9): qty 1

## 2021-07-12 MED ORDER — VANCOMYCIN HCL 2000 MG/400ML IV SOLN
2000.0000 mg | Freq: Once | INTRAVENOUS | Status: AC
  Administered 2021-07-12: 2000 mg via INTRAVENOUS
  Filled 2021-07-12: qty 400

## 2021-07-12 MED ORDER — VANCOMYCIN HCL IN DEXTROSE 1-5 GM/200ML-% IV SOLN: 1000.0000 mg | INTRAVENOUS | Status: DC

## 2021-07-12 MED ORDER — ONDANSETRON HCL 4 MG/2ML IJ SOLN
4.0000 mg | Freq: Four times a day (QID) | INTRAMUSCULAR | Status: DC | PRN
  Administered 2021-07-14: 4 mg via INTRAVENOUS
  Filled 2021-07-12 (×2): qty 2

## 2021-07-12 NOTE — H&P (Addendum)
History and Physical    Latoya Freeman YPP:[id number] DOB: 01-07-1941 DOA: 07/12/2021  PCP: [name], MD   Patient coming from: Home  I have personally briefly reviewed patient's old medical records in [place]  Chief Complaint: Headache  HPI: Latoya Freeman is a 80 y.o. female with medical history significant for hypothyroidism, hypertension, GERD, status post recent L3-L4 discectomy and L4-L5 as well as L5-S1 decompression on 07/01/21 and was discharged home in stable condition.  She presents to the ER for evaluation of a headache which she described as the worst headache of her life and rated a 10 x 10 in intensity at its worst.  Headache woke the patient up out of her sleep at about 3 AM but she has had intermittent headaches over the last 1 week which she thought was related to her sinuses.  Her daughter also states that she has had sinus congestion.  Headache is associated with nausea and vomiting but she denies any blurred vision.  Does not have a history of chronic headaches. She also complains of weakness as well as pain in her lower back. Patient's daughter states that they had gone out for lunch 1 day prior to her admission and then to [facility] when the patient developed sudden onset abdominal discomfort.  She had to go urgently to the bathroom but her daughter is unable to tell me if she had a bowel movement. Patient received morphine for pain and even though she is awake at this time is not able to provide a lot of history. She does complain of some dysuria but denies having any fever or chills. She also complains of pain in her neck. I am unable to do review of systems on this patient at this time. Labs show sodium 138, potassium 2.8, chloride 105, bicarb 22, glucose 133, BUN 25 compared to baseline of 12, creatinine 1.63 compared to baseline of 1.30, calcium 10.2, alkaline phosphatase 53, albumin 4.0, AST 20, ALT 12, total protein 7.2, white count 20.7, hemoglobin  13.7, hematocrit 38.5, MCV 88.5, RDW 12.2, platelet count 329, PT 13.3, INR 1.0 Respiratory viral panel is negative CT scan of the head without contrast shows findings status postcraniotomy for aneurysm clipping.  Cerebral atrophy and small vessel ischemic change. Chest x-ray reviewed by me shows no evidence of acute cardiopulmonary disease Twelve-lead EKG reviewed by me shows normal sinus rhythm with nonspecific T wave changes   ED Course: Patient is a 80 year old female who presents to the emergency room for evaluation of worsening headache which she describes as the worst headache of her life.  She is status post recent L3-L4 discectomy, L4-L5 and L5-S1 decompression. She is afebrile but has marked leukocytosis of 20,000. LP was attempted by the emergency room physician but was unsuccessful and will be done by interventional radiology. Patient has been placed empirically on Rocephin and vancomycin for suspected meningitis. She will be admitted to the hospital for further evaluation.   Review of Systems: As per HPI otherwise all other systems reviewed and negative.    Past Medical History:  Diagnosis Date   Aneurysm ([place]) 1980   Arthritis    Cancer ([place])    GERD (gastroesophageal reflux disease)    Hypertension 1980   Hypothyroidism 1999   Pneumonia    Pulmonary embolism ([place])    Shingles    Wears dentures    full upper and lower    Past Surgical History:  Procedure Laterality Date   BRAIN SURGERY  aneurysm   BREAST CYST ASPIRATION Left 2005   BREAST EXCISIONAL BIOPSY Left 2013   atypical ductal hyperplasia   BREAST SURGERY  2013   LF Breast Wide EXC    CHOLECYSTECTOMY  2004   COLONOSCOPY     COLONOSCOPY WITH PROPOFOL N/A 01/31/2018   Procedure: COLONOSCOPY WITH PROPOFOL;  Surgeon: Lollie Sails, MD;  Location: [facility] ENDOSCOPY;  Service: Endoscopy;  Laterality: N/A;   COLONOSCOPY WITH PROPOFOL N/A 08/06/2020   Procedure: COLONOSCOPY WITH PROPOFOL;  Surgeon:  Virgel Manifold, MD;  Location: [facility] ENDOSCOPY;  Service: Endoscopy;  Laterality: N/A;   DILATION AND CURETTAGE OF UTERUS N/A 05/03/2019   Procedure: DILATATION AND CURETTAGE;  Surgeon: Gae Dry, MD;  Location: [facility] ORS;  Service: Gynecology;  Laterality: N/A;   ESOPHAGOGASTRODUODENOSCOPY N/A 01/31/2018   Procedure: ESOPHAGOGASTRODUODENOSCOPY (EGD);  Surgeon: Lollie Sails, MD;  Location: [facility] ENDOSCOPY;  Service: Endoscopy;  Laterality: N/A;   ESOPHAGOGASTRODUODENOSCOPY (EGD) WITH PROPOFOL N/A 08/06/2020   Procedure: ESOPHAGOGASTRODUODENOSCOPY (EGD) WITH PROPOFOL;  Surgeon: Virgel Manifold, MD;  Location: [facility] ENDOSCOPY;  Service: Endoscopy;  Laterality: N/A;   LUMBAR LAMINECTOMY/DECOMPRESSION MICRODISCECTOMY N/A 07/01/2021   Procedure: LEFT L3-4 MICRODSICECTOMY, L4-S1 DECOMPRESSION;  Surgeon: Meade Maw, MD;  Location: [facility] ORS;  Service: Neurosurgery;  Laterality: N/A;   SHOULDER ARTHROSCOPY Left 07/25/2018   Procedure: SHOULDER MINI OPEN ROTATOR CUFF REPAIR  BICEPS TENDOSIS ARTHROSCOPIC DISTAL CLAVICLE EXCISION  SUBACROMIAL DECOMP;  Surgeon: Leim Fabry, MD;  Location: [place];  Service: Orthopedics;  Laterality: Left;  [facility] WITH SPYDER SMITH & NEPHEW HEAD COIL ANCHOR FOOTPRINT ANCHOR QFIX ANCHOR   VISCERAL ANGIOGRAPHY N/A 08/14/2020   Procedure: VISCERAL ANGIOGRAPHY;  Surgeon: Algernon Huxley, MD;  Location: [facility] CV LAB;  Service: Cardiovascular;  Laterality: N/A;     reports that she quit smoking about 32 years ago. Her smoking use included cigarettes. She has a 30.00 pack-year smoking history. She has never used smokeless tobacco. She reports that she does not drink alcohol and does not use drugs.  Allergies  Allergen Reactions   Bee Venom Swelling   Shellfish Allergy Swelling   Tamoxifen Hives   Penicillins Hives, Rash and Other (See Comments)    Has patient had a PCN reaction causing immediate rash, facial/tongue/throat swelling,  SOB or lightheadedness with hypotension: Unknown Has patient had a PCN reaction causing severe rash involving mucus membranes or skin necrosis: Unknown Has patient had a PCN reaction that required hospitalization: Unknown Has patient had a PCN reaction occurring within the last 10 years: No If all of the above answers are "NO", then may proceed with Cephalosporin use.     Family History  Problem Relation Age of Onset   Breast cancer Neg Hx       Prior to Admission medications   Medication Sig Start Date End Date Taking? Authorizing Provider  amLODipine (NORVASC) 5 MG tablet Take 7.5 mg by mouth daily. 02/07/13   [provider]  bisacodyl (DULCOLAX) 5 MG EC tablet Take 1 tablet (5 mg total) by mouth daily as needed for moderate constipation. 07/02/21   Loleta Dicker, PA  celecoxib (CELEBREX) 200 MG capsule Take 1 capsule (200 mg total) by mouth every 12 (twelve) hours. 07/02/21   Loleta Dicker, PA  cetirizine (ZYRTEC) 10 MG tablet Take 10 mg by mouth daily as needed for allergies.    [provider]  citalopram (CELEXA) 20 MG tablet Take 20 mg by mouth daily. 04/23/19   [provider]  levothyroxine (SYNTHROID, LEVOTHROID) 75 MCG tablet Take 75 mcg by mouth daily.    [provider]  losartan (COZAAR) 50 MG tablet Take 50 mg by mouth daily.     [provider]  omeprazole (PRILOSEC) 20 MG capsule Take 20 mg by mouth 2 (two) times daily. 07/10/20   [provider]  senna (SENOKOT) 8.6 MG TABS tablet Take 1 tablet (8.6 mg total) by mouth 2 (two) times daily. 07/02/21   Loleta Dicker, PA  triamterene-hydrochlorothiazide (MAXZIDE-25) 37.5-25 MG tablet Take 1 tablet by mouth daily. 07/10/20   [provider]    Physical Exam: Vitals:   07/12/21 1032 07/12/21 1115 07/12/21 1218 07/12/21 1230  BP: (!) 134/55 (!) 123/57 (!) 107/57 94/70  Pulse: 82 80 81 86  Resp: 20 (!) 23 20 (!) 21  Temp:      TempSrc:      SpO2: 97%  95% 92% 93%  Weight:      Height:         Vitals:   07/12/21 1032 07/12/21 1115 07/12/21 1218 07/12/21 1230  BP: (!) 134/55 (!) 123/57 (!) 107/57 94/70  Pulse: 82 80 81 86  Resp: 20 (!) 23 20 (!) 21  Temp:      TempSrc:      SpO2: 97% 95% 92% 93%  Weight:      Height:          Constitutional: Alert and oriented x 1, only to person during my evaluation.  Per daughter she was awake, alert and oriented x3 but had received morphine for pain control in the ER and does not do well with IV morphine.. Not in any apparent distress HEENT:      Head: Normocephalic and atraumatic.         Eyes: PERLA, EOMI, Conjunctivae are normal. Sclera is non-icteric.       Mouth/Throat: Mucous membranes are moist.       Neck: Supple with no signs of meningismus. Cardiovascular: Regular rate and rhythm. No murmurs, gallops, or rubs. 2+ symmetrical distal pulses are present . No JVD. No LE edema Respiratory: Respiratory effort normal .Lungs sounds clear bilaterally. No wheezes, crackles, or rhonchi.  Gastrointestinal: Soft, non tender, and non distended with positive bowel sounds.  Genitourinary: No CVA tenderness. Musculoskeletal: Staples from recent back surgery in place, no erythema, no purulent drainage. No cyanosis, or erythema of extremities. Neurologic:  Face is symmetric. Moving all extremities. No gross focal neurologic deficit.  Generalized weakness Skin: Skin is warm, dry.  No rash or ulcers Psychiatric: Mood and affect are normal    Labs on Admission: I have personally reviewed following labs and imaging studies  CBC: Recent Labs  Lab 07/12/21 0533  WBC 20.7*  NEUTROABS 16.4*  HGB 13.7  HCT 38.5  MCV 88.5  PLT 366   Basic Metabolic Panel: Recent Labs  Lab 07/12/21 0533  NA 138  K 2.8*  CL 105  CO2 22  GLUCOSE 133*  BUN 25*  CREATININE 1.63*  CALCIUM 10.2   GFR: Estimated Creatinine Clearance: 28.3 mL/min (A) (by C-G formula based on SCr of 1.63 mg/dL (H)). Liver  Function Tests: Recent Labs  Lab 07/12/21 0533  AST 20  ALT 12  ALKPHOS 53  BILITOT 0.9  PROT 7.2  ALBUMIN 4.0   No results for input(s): LIPASE, AMYLASE in the last 168 hours. No results for input(s): AMMONIA in the last 168 hours. Coagulation Profile: Recent Labs  Lab 07/12/21 1044  INR 1.0   Cardiac Enzymes: No results for input(s): CKTOTAL, CKMB, CKMBINDEX, TROPONINI in the last 168 hours. BNP (last 3 results) No results for input(s): PROBNP in the last 8760 hours. HbA1C: No results for input(s): HGBA1C in the last 72 hours. CBG: Recent Labs  Lab 07/12/21 0546  GLUCAP 132*   Lipid Profile: No results for input(s): CHOL, HDL, LDLCALC, TRIG, CHOLHDL, LDLDIRECT in the last 72 hours. Thyroid Function Tests: No results for input(s): TSH, T4TOTAL, FREET4, T3FREE, THYROIDAB in the last 72 hours. Anemia Panel: No results for input(s): VITAMINB12, FOLATE, FERRITIN, TIBC, IRON, RETICCTPCT in the last 72 hours. Urine analysis:    Component Value Date/Time   COLORURINE STRAW (A) 07/17/2020 1511   APPEARANCEUR CLEAR (A) 07/17/2020 1511   LABSPEC 1.005 07/17/2020 1511   PHURINE 6.0 07/17/2020 1511   GLUCOSEU NEGATIVE 07/17/2020 1511   HGBUR NEGATIVE 07/17/2020 1511   BILIRUBINUR NEGATIVE 07/17/2020 1511   BILIRUBINUR neg 03/20/2019 [place] 07/17/2020 1511   PROTEINUR NEGATIVE 07/17/2020 1511   UROBILINOGEN negative (A) 03/20/2019 1148   NITRITE NEGATIVE 07/17/2020 1511   LEUKOCYTESUR TRACE (A) 07/17/2020 1511    Radiological Exams on Admission: DG Chest 2 View  Result Date: 07/12/2021 CLINICAL DATA:  Headache. Shortness of breath. Patient off her blood thinners for 3 weeks. Had recent back surgery. EXAM: CHEST - 2 VIEW COMPARISON:  06/08/2020 FINDINGS: Cardiac silhouette is normal in size. No mediastinal or hilar masses or evidence adenopathy. Linear opacities noted in the right middle lobe consistent with atelectasis or scarring. Lungs otherwise  clear. Lung volumes are low. No pleural effusion or pneumothorax. Skeletal structures are demineralized, but intact. IMPRESSION: No acute cardiopulmonary disease. Electronically Signed   By: Lajean Manes M.D.   On: 07/12/2021 12:05   CT Head Wo Contrast  Result Date: 07/12/2021 CLINICAL DATA:  Headache. History of ruptured aneurysm. Recent back surgery. EXAM: CT HEAD WITHOUT CONTRAST TECHNIQUE: Contiguous axial images were obtained from the base of the skull through the vertex without intravenous contrast. COMPARISON:  None. FINDINGS: Brain: Expected cerebral volume loss for age. Mild low density in the periventricular white matter likely related to small vessel disease. Given limitations of beam hardening artifact from aneurysm clips, no mass lesion, hemorrhage, hydrocephalus, acute infarct, intra-axial, or extra-axial fluid collection. Vascular: Intracranial atherosclerosis. Skull: Left frontotemporal craniotomy for aneurysm clipping. Sinuses/Orbits: Normal imaged portions of the orbits and globes. Clear paranasal sinuses and mastoid air cells. Other: None. IMPRESSION: 1. Status post craniotomy for aneurysm clipping. Given beam hardening artifact from aneurysm clips, no acute intracranial abnormality. 2.  Cerebral atrophy and small vessel ischemic change. Electronically Signed   By: Abigail Miyamoto M.D.   On: 07/12/2021 06:19     Assessment/Plan Principal Problem:   Headache Active Problems:   History of pulmonary embolism   GERD (gastroesophageal reflux disease)   Thyroid disease   Hypokalemia   Obesity (BMI 30-39.9)   Benign hypertension with CKD (chronic kidney disease) stage III (HCC)   Dehydration     Headache Rule out meningitis Follow-up results of CSF studies Continue empiric antibiotic therapy with vancomycin and Rocephin    History of pulmonary embolism Patient is on Apixaban which is currently on hold for planned LP Resume apixaban in a.m.     Dehydration and  hypokalemia Secondary to diuretic therapy and poor oral intake IV fluid hydration Obtain magnesium levels    Hypertension with stage III chronic kidney disease Hold antihypertensive medications for now since patient is  normotensive     Depression Continue citalopram    Hypothyroidism Continue Synthroid    Weakness Patient will need PT evaluation once her acute illness improves  DVT prophylaxis: Apixaban (on hold for LP, please resume in a.m.) Code Status: full code  Family Communication: Greater than 50% of time was spent discussing patient's condition and plan of care with her daughter at the bedside.  All questions and concerns have been addressed and she verbalizes understanding and agree with the plan Disposition Plan: Back to previous home environment Consults called: Neurosurgery Status: At the time of admission, it appears that the appropriate admission status for this patient is inpatient. This is judged to be reasonable and necessary in order to provide the required intensity of service to ensure the patient's safety given the presenting symptoms, physical exam findings, and initial radiographic and laboratory data in the context of their comorbid conditions. Patient requires inpatient status due to high intensity of service, high risk of further deterioration and high frequency of surveillance required.    Collier Bullock MD [facility]     07/12/2021, 2:05 PM

## 2021-07-12 NOTE — Procedures (Signed)
PROCEDURE: Informed consent was obtained from the patient's daughter, Jeannene Patella prior to the procedure, including potential complications of headache, allergy, and pain.  With the patient prone, the lower back was prepped with Betadine.  1% Lidocaine was used for local anesthesia.  Lumbar puncture was performed at the [L3-4] level using a [21] gauge needle with return of [bloody, cloudy, yellowish] CSF with an opening pressure of [7] cm water.  During collection, the fluid cleared and was cloudy-yellowish [.  Eight] ml of CSF were obtained for laboratory studies.  The patient tolerated the procedure well and there were no apparent complications.   IMPRESSION: [Status post fluoroscopic guided lumbar puncture at L3-4.  No apparent complications.]

## 2021-07-12 NOTE — Consult Note (Signed)
Referring Physician:  No referring provider defined for this encounter.  Primary Physician:  [name], MD  Chief Complaint:  headache  History of Present Illness: 07/12/2021 Latoya Freeman is a 80 y.o. female who presents with the chief complaint of headache.  She is POD11 from L3-S1 decompression with a CSF leak closed without issue.  She had been doing well until yesterday when she began having a headache.  She has some back pain but it isn't too bad.  She has had sinus symptoms for a week.   Prior to yesterday she had been doing very well.  She denies headaches,nausea,vomiting.  Review of Systems:  A 10 point review of systems is negative, except for the pertinent positives and negatives detailed in the HPI.  Past Medical History: Past Medical History:  Diagnosis Date   Aneurysm ([place]) 1980   Arthritis    Cancer ([place])    GERD (gastroesophageal reflux disease)    Hypertension 1980   Hypothyroidism 1999   Pneumonia    Pulmonary embolism ([place])    Shingles    Wears dentures    full upper and lower    Past Surgical History: Past Surgical History:  Procedure Laterality Date   BRAIN SURGERY     aneurysm   BREAST CYST ASPIRATION Left 2005   BREAST EXCISIONAL BIOPSY Left 2013   atypical ductal hyperplasia   BREAST SURGERY  2013   LF Breast Wide EXC    CHOLECYSTECTOMY  2004   COLONOSCOPY     COLONOSCOPY WITH PROPOFOL N/A 01/31/2018   Procedure: COLONOSCOPY WITH PROPOFOL;  Surgeon: Lollie Sails, MD;  Location: [facility] ENDOSCOPY;  Service: Endoscopy;  Laterality: N/A;   COLONOSCOPY WITH PROPOFOL N/A 08/06/2020   Procedure: COLONOSCOPY WITH PROPOFOL;  Surgeon: Virgel Manifold, MD;  Location: [facility] ENDOSCOPY;  Service: Endoscopy;  Laterality: N/A;   DILATION AND CURETTAGE OF UTERUS N/A 05/03/2019   Procedure: DILATATION AND CURETTAGE;  Surgeon: Gae Dry, MD;  Location: [facility] ORS;  Service: Gynecology;  Laterality: N/A;   ESOPHAGOGASTRODUODENOSCOPY N/A  01/31/2018   Procedure: ESOPHAGOGASTRODUODENOSCOPY (EGD);  Surgeon: Lollie Sails, MD;  Location: [facility] ENDOSCOPY;  Service: Endoscopy;  Laterality: N/A;   ESOPHAGOGASTRODUODENOSCOPY (EGD) WITH PROPOFOL N/A 08/06/2020   Procedure: ESOPHAGOGASTRODUODENOSCOPY (EGD) WITH PROPOFOL;  Surgeon: Virgel Manifold, MD;  Location: [facility] ENDOSCOPY;  Service: Endoscopy;  Laterality: N/A;   LUMBAR LAMINECTOMY/DECOMPRESSION MICRODISCECTOMY N/A 07/01/2021   Procedure: LEFT L3-4 MICRODSICECTOMY, L4-S1 DECOMPRESSION;  Surgeon: Meade Maw, MD;  Location: [facility] ORS;  Service: Neurosurgery;  Laterality: N/A;   SHOULDER ARTHROSCOPY Left 07/25/2018   Procedure: SHOULDER MINI OPEN ROTATOR CUFF REPAIR  BICEPS TENDOSIS ARTHROSCOPIC DISTAL CLAVICLE EXCISION  SUBACROMIAL DECOMP;  Surgeon: Leim Fabry, MD;  Location: [place];  Service: Orthopedics;  Laterality: Left;  [facility] WITH SPYDER SMITH & NEPHEW HEAD COIL ANCHOR FOOTPRINT ANCHOR QFIX ANCHOR   VISCERAL ANGIOGRAPHY N/A 08/14/2020   Procedure: VISCERAL ANGIOGRAPHY;  Surgeon: Algernon Huxley, MD;  Location: [place] CV LAB;  Service: Cardiovascular;  Laterality: N/A;    Allergies: Allergies as of 07/12/2021 - Review Complete 07/12/2021  Allergen Reaction Noted   Bee venom Swelling 01/31/2018   Shellfish allergy Swelling 01/31/2018   Tamoxifen Hives 07/20/2018   Penicillins Hives, Rash, and Other (See Comments) 12/21/2012    Medications:  Current Facility-Administered Medications:    potassium chloride 10 mEq in 100 mL IVPB, 10 mEq, Intravenous, Q1 Hr x 3, Duffy Bruce, MD   potassium chloride SA (KLOR-CON)  CR tablet 40 mEq, 40 mEq, Oral, Once, Duffy Bruce, MD  Current Outpatient Medications:    amLODipine (NORVASC) 5 MG tablet, Take 7.5 mg by mouth daily., Disp: , Rfl:    bisacodyl (DULCOLAX) 5 MG EC tablet, Take 1 tablet (5 mg total) by mouth daily as needed for moderate constipation., Disp: 30 tablet, Rfl: 0   celecoxib  (CELEBREX) 200 MG capsule, Take 1 capsule (200 mg total) by mouth every 12 (twelve) hours., Disp: 30 capsule, Rfl: 0   cetirizine (ZYRTEC) 10 MG tablet, Take 10 mg by mouth daily as needed for allergies., Disp: , Rfl:    citalopram (CELEXA) 20 MG tablet, Take 20 mg by mouth daily., Disp: , Rfl:    levothyroxine (SYNTHROID, LEVOTHROID) 75 MCG tablet, Take 75 mcg by mouth daily., Disp: , Rfl:    losartan (COZAAR) 50 MG tablet, Take 50 mg by mouth daily. , Disp: , Rfl:    omeprazole (PRILOSEC) 20 MG capsule, Take 20 mg by mouth 2 (two) times daily., Disp: , Rfl:    senna (SENOKOT) 8.6 MG TABS tablet, Take 1 tablet (8.6 mg total) by mouth 2 (two) times daily., Disp: 120 tablet, Rfl: 0   triamterene-hydrochlorothiazide (MAXZIDE-25) 37.5-25 MG tablet, Take 1 tablet by mouth daily., Disp: , Rfl:    Social History: Social History   Tobacco Use   Smoking status: Former    Packs/day: 1.50    Years: 20.00    Pack years: 30.00    Types: Cigarettes    Quit date: 1990    Years since quitting: 32.6   Smokeless tobacco: Never  Vaping Use   Vaping Use: Never used  Substance Use Topics   Alcohol use: No   Drug use: No    Family Medical History: Family History  Problem Relation Age of Onset   Breast cancer Neg Hx     Physical Examination: Vitals:   07/12/21 0531 07/12/21 1032  BP: (!) 141/61 (!) 134/55  Pulse: 84 82  Resp: 20 20  Temp: 98 F (36.7 C)   SpO2: 93% 97%     General: Patient is well developed, well nourished, calm, collected, and in mild distress.  Psychiatric: Patient is non-anxious.  Head:  Pupils equal, round, and reactive to light.  ENT:  Oral mucosa appears well hydrated.  Neck:   Supple.  Full range of motion.  Respiratory: Patient is breathing without any difficulty.  Extremities: No edema.  Vascular: Palpable pulses in dorsal pedal vessels.  Skin:   On exposed skin, there are no abnormal skin lesions.  NEUROLOGICAL:  General: In no acute distress.    Awake, alert, oriented to person, place, and time.  Pupils equal round and reactive to light.  Facial tone is symmetric.  Tongue protrusion is midline.  There is no pronator drift.   Strength: Grossly 5/5 throughout  Incision shows some reaction to the nylon sutures but no drainage, nontender.   Gait is untested Imaging: HCT negative for [facility]  I have personally reviewed the images and agree with the above interpretation.  Labs: CBC Latest Ref Rng & Units 07/12/2021 07/02/2021 07/01/2021  WBC 4.0 - 10.5 K/uL 20.7(H) 19.4(H) -  Hemoglobin 12.0 - 15.0 g/dL 13.7 13.0 13.3  Hematocrit 36.0 - 46.0 % 38.5 36.8 39.0  Platelets 150 - 400 K/uL 329 267 -       Assessment and Plan: Ms. Baldini is a pleasant 80 y.o. female with headache since yesterday with prior concern for sinus symptoms.  Ddx  includes surgical site infection (with possible meningitis), CT neg SAH, other infectious etiology, or headache syndrome.  Discussed with Dr. Ellender Hose - will proceed with LP to rule out SAH and infection.  If negative, would recommend headache cocktail. He will also pursue other infectious workup.   Markee Matera K. Izora Ribas MD, [facility] of Neurosurgery

## 2021-07-12 NOTE — ED Notes (Signed)
MD at bedside. 

## 2021-07-12 NOTE — Consult Note (Signed)
Pharmacy Antibiotic Note  Latoya Freeman is a 80 y.o. female admitted on 07/12/2021 with potential meningitis.  Pharmacy has been consulted for vancomycin dosing.  Post-op day 11 from L3-S1 decompression with a CSF leak closed without issue. Presented with headache and some back pain. Neurology is following and is concerned for meningitis. Plan for LP today.    Plan: -Will give Vancomycin load of 2g IV once -Will plan on giving vancomycin 1g q24h based on renal function and weight -Goal trough 15-8mcg/mL -Monitor renal function daily to access need for dose adjustments  Height: 5\' 1"  (154.9 cm) Weight: 88.5 kg (195 lb 1.7 oz) IBW/kg (Calculated) : 47.8  Temp (24hrs), Avg:98 F (36.7 C), Min:98 F (36.7 C), Max:98 F (36.7 C)  Recent Labs  Lab 07/12/21 0533  WBC 20.7*  CREATININE 1.63*    Estimated Creatinine Clearance: 28.3 mL/min (A) (by C-G formula based on SCr of 1.63 mg/dL (H)).    Allergies  Allergen Reactions   Bee Venom Swelling   Shellfish Allergy Swelling   Tamoxifen Hives   Penicillins Hives, Rash and Other (See Comments)    Has patient had a PCN reaction causing immediate rash, facial/tongue/throat swelling, SOB or lightheadedness with hypotension: Unknown Has patient had a PCN reaction causing severe rash involving mucus membranes or skin necrosis: Unknown Has patient had a PCN reaction that required hospitalization: Unknown Has patient had a PCN reaction occurring within the last 10 years: No If all of the above answers are "NO", then may proceed with Cephalosporin use.     Antimicrobials this admission: Vancomycin [id number] >>   Microbiology results: 0821 BCx: pending   Thank you for allowing pharmacy to be a part of this patient's care.  Narda Rutherford, PharmD Pharmacy Resident  07/12/2021 1:54 PM

## 2021-07-12 NOTE — ED Notes (Signed)
Patient back from radiology having LP. Patient not speaking to RN or daughter. MD aware. Oxygen saturation 89% on room air, placed on 2L [place].

## 2021-07-12 NOTE — Progress Notes (Addendum)
CSF fluid analysis, glucose < 20, WBC > 14,000 and protein 600g/dl Findings consistent with bacterial meningitis Will place patient on IV Meropenem to cover Listeria since she has severe allergies to PCN. Will stop Rocephin ID consult in am

## 2021-07-12 NOTE — ED Triage Notes (Signed)
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 Pt reports 3-4 episodes of vomiting since awakening this morning. Pt states awoke with HA approx 0300, however has had intermittent HA x 1 week. Pt's daughter reports sinus symptoms x 1 week as well.  Pt denies numbness/weakness/speech difficulty at this time. Pt alert and able to answer questions.

## 2021-07-12 NOTE — ED Notes (Signed)
Care transferred, report received from [place], [place]

## 2021-07-12 NOTE — ED Provider Notes (Addendum)
[facility] Emergency Department Provider Note  ____________________________________________   Event Date/Time   First MD Initiated Contact with Patient 07/12/21 [phone]     (approximate)  I have reviewed the triage vital signs and the nursing notes.   HISTORY  Chief Complaint Headache    HPI Latoya Freeman is a 80 y.o. female status post L4-S1 lumbar decompression and L3-4 microdiscectomy on 8/10 here with headache.  The patient states that she has been recovering overall very well from her surgery.  She had been ambulating and pain had been improving.  Over the last several days, however, she has developed progressively worsening and now severe, 10 out of 10, headache and neck pain shooting down to her lower back.  She has had some associated mild neck stiffness.  She states she has had associated nausea but no vomiting.  Headache is severe, fairly constant.  No specific alleviating factors.  Her back pain is worsened as well.  Denies any lower extremity numbness or weakness.  No other complaints.  She does have history of aneurysm which she became concerned about with her headache so she presents for further evaluation.  Denies any drainage or breakdown of the operative site.    Past Medical History:  Diagnosis Date   Aneurysm ([place]) 1980   Arthritis    Cancer ([place])    GERD (gastroesophageal reflux disease)    Hypertension 1980   Hypothyroidism 1999   Pneumonia    Pulmonary embolism ([place])    Shingles    Wears dentures    full upper and lower    Patient Active Problem List   Diagnosis Date Noted   Headache 07/12/2021   Dehydration 07/12/2021   Lumbar radiculopathy, chronic 07/01/2021   Iron deficiency anemia    Polyp of colon    Columnar-lined esophagus    Abdominal pain 08/05/2020   Celiac artery stenosis (HCC) 08/05/2020   Mesenteric ischemia ([place]) 07/17/2020   History of pulmonary embolism 04/29/2020   Anemia in chronic kidney disease  04/23/2020   Secondary hyperparathyroidism of renal origin ([place]) 04/23/2020   Urinary tract infection 04/23/2020   Benign breast lumps 02/23/2020   Brain aneurysm 02/23/2020   Hemorrhoids 02/23/2020   Left wrist tendonitis 02/23/2020   Menopausal symptoms 02/23/2020   Renal mass 02/23/2020   Hypercalcemia 12/18/2019   Hypertensive chronic kidney disease with stage 1 through stage 4 chronic kidney disease, or unspecified chronic kidney disease 12/18/2019   Hypokalemia 12/18/2019   Benign hypertension with CKD (chronic kidney disease) stage III ([place]) 12/18/2019   Cyst of left ovary 04/04/2019   Postmenopausal bleeding 04/04/2019   Endometrial thickening on ultrasound 04/04/2019   Pulmonary embolism (HCC)    GERD (gastroesophageal reflux disease) 08/07/2018   Hypertension 08/07/2018   Shingles 08/07/2018   Thyroid disease 08/07/2018   HCAP (healthcare-associated pneumonia) 07/27/2018   Obesity (BMI 30-39.9) 06/21/2018    Past Surgical History:  Procedure Laterality Date   BRAIN SURGERY     aneurysm   BREAST CYST ASPIRATION Left 2005   BREAST EXCISIONAL BIOPSY Left 2013   atypical ductal hyperplasia   BREAST SURGERY  2013   LF Breast Wide EXC    CHOLECYSTECTOMY  2004   COLONOSCOPY     COLONOSCOPY WITH PROPOFOL N/A 01/31/2018   Procedure: COLONOSCOPY WITH PROPOFOL;  Surgeon: Lollie Sails, MD;  Location[facility] ENDOSCOPY;  Service: Endoscopy;  Laterality: N/A;   COLONOSCOPY WITH PROPOFOL N/A 08/06/2020   Procedure: COLONOSCOPY WITH PROPOFOL;  Surgeon:  Virgel Manifold, MD;  Location: [facility] ENDOSCOPY;  Service: Endoscopy;  Laterality: N/A;   DILATION AND CURETTAGE OF UTERUS N/A 05/03/2019   Procedure: DILATATION AND CURETTAGE;  Surgeon: Gae Dry, MD;  Location: [facility] ORS;  Service: Gynecology;  Laterality: N/A;   ESOPHAGOGASTRODUODENOSCOPY N/A 01/31/2018   Procedure: ESOPHAGOGASTRODUODENOSCOPY (EGD);  Surgeon: Lollie Sails, MD;  Location: [facility] ENDOSCOPY;  Service:  Endoscopy;  Laterality: N/A;   ESOPHAGOGASTRODUODENOSCOPY (EGD) WITH PROPOFOL N/A 08/06/2020   Procedure: ESOPHAGOGASTRODUODENOSCOPY (EGD) WITH PROPOFOL;  Surgeon: Virgel Manifold, MD;  Location: [facility] ENDOSCOPY;  Service: Endoscopy;  Laterality: N/A;   LUMBAR LAMINECTOMY/DECOMPRESSION MICRODISCECTOMY N/A 07/01/2021   Procedure: LEFT L3-4 MICRODSICECTOMY, L4-S1 DECOMPRESSION;  Surgeon: Meade Maw, MD;  Location: [facility] ORS;  Service: Neurosurgery;  Laterality: N/A;   SHOULDER ARTHROSCOPY Left 07/25/2018   Procedure: SHOULDER MINI OPEN ROTATOR CUFF REPAIR  BICEPS TENDOSIS ARTHROSCOPIC DISTAL CLAVICLE EXCISION  SUBACROMIAL DECOMP;  Surgeon: Leim Fabry, MD;  Location: [place];  Service: Orthopedics;  Laterality: Left;  [facility] WITH SPYDER SMITH & NEPHEW HEAD COIL ANCHOR FOOTPRINT ANCHOR QFIX ANCHOR   VISCERAL ANGIOGRAPHY N/A 08/14/2020   Procedure: VISCERAL ANGIOGRAPHY;  Surgeon: Algernon Huxley, MD;  Location: [place] CV LAB;  Service: Cardiovascular;  Laterality: N/A;    Prior to Admission medications   Medication Sig Start Date End Date Taking? Authorizing Provider  amLODipine (NORVASC) 5 MG tablet Take 7.5 mg by mouth daily. 02/07/13   [provider]  apixaban (ELIQUIS) 5 MG TABS tablet Take 5 mg by mouth 2 (two) times daily.    [provider]  bisacodyl (DULCOLAX) 5 MG EC tablet Take 1 tablet (5 mg total) by mouth daily as needed for moderate constipation. 07/02/21   Loleta Dicker, PA  celecoxib (CELEBREX) 200 MG capsule Take 1 capsule (200 mg total) by mouth every 12 (twelve) hours. 07/02/21   Loleta Dicker, PA  cetirizine (ZYRTEC) 10 MG tablet Take 10 mg by mouth daily as needed for allergies.    [provider]  citalopram (CELEXA) 20 MG tablet Take 20 mg by mouth daily. 04/23/19   [provider]  clopidogrel (PLAVIX) 75 MG tablet Take 75 mg by mouth daily.    [provider]  HYDROcodone-acetaminophen  (NORCO/VICODIN) 5-325 MG tablet Take 1 tablet by mouth every 6 (six) hours as needed for moderate pain or severe pain.    [provider]  levothyroxine (SYNTHROID, LEVOTHROID) 75 MCG tablet Take 75 mcg by mouth daily.    [provider]  losartan (COZAAR) 50 MG tablet Take 50 mg by mouth daily.     [provider]  omeprazole (PRILOSEC) 20 MG capsule Take 20 mg by mouth 2 (two) times daily. 07/10/20   [provider]  potassium chloride (KLOR-CON) 10 MEQ tablet Take 10 mEq by mouth daily.    [provider]  senna (SENOKOT) 8.6 MG TABS tablet Take 1 tablet (8.6 mg total) by mouth 2 (two) times daily. 07/02/21   Loleta Dicker, PA  traMADol (ULTRAM) 50 MG tablet Take 50 mg by mouth every 6 (six) hours as needed for severe pain.    [provider]  triamterene-hydrochlorothiazide (MAXZIDE-25) 37.5-25 MG tablet Take 1 tablet by mouth daily. 07/10/20   [provider]    Allergies Bee venom, Shellfish allergy, Tamoxifen, and Penicillins  Family History  Problem Relation Age of Onset   Breast cancer Neg Hx     Social History Social History  Tobacco Use   Smoking status: Former    Packs/day: 1.50    Years: 20.00    Pack years: 30.00    Types: Cigarettes    Quit date: 1990    Years since quitting: 32.6   Smokeless tobacco: Never  Vaping Use   Vaping Use: Never used  Substance Use Topics   Alcohol use: No   Drug use: No    Review of Systems  Review of Systems  Constitutional:  Negative for fatigue and fever.  HENT:  Negative for congestion and sore throat.   Eyes:  Negative for visual disturbance.  Respiratory:  Negative for cough and shortness of breath.   Cardiovascular:  Negative for chest pain.  Gastrointestinal:  Negative for abdominal pain, diarrhea, nausea and vomiting.  Genitourinary:  Negative for flank pain.  Musculoskeletal:  Positive for back pain. Negative for neck pain.  Skin:  Negative for rash and  wound.  Neurological:  Positive for headaches. Negative for weakness.  All other systems reviewed and are negative.   ____________________________________________  PHYSICAL EXAM:      VITAL SIGNS: ED Triage Vitals  Enc Vitals Group     BP 07/12/21 0531 (!) 141/61     Pulse Rate 07/12/21 0531 84     Resp 07/12/21 0531 20     Temp 07/12/21 0531 98 F (36.7 C)     Temp Source 07/12/21 0531 Oral     SpO2 07/12/21 0531 93 %     Weight 07/12/21 0529 195 lb 1.7 oz (88.5 kg)     Height 07/12/21 0529 5\' 1"  (1.549 m)     Head Circumference --      Peak Flow --      Pain Score 07/12/21 0528 10     Pain Loc --      Pain Edu? --      Excl. in [place]? --      Physical Exam Vitals and nursing note reviewed.  Constitutional:      General: She is not in acute distress.    Appearance: She is well-developed.  HENT:     Head: Normocephalic and atraumatic.  Eyes:     Conjunctiva/sclera: Conjunctivae normal.  Neck:     Comments: Mild neck stiffness noted Cardiovascular:     Rate and Rhythm: Normal rate and regular rhythm.     Heart sounds: Normal heart sounds.  Pulmonary:     Effort: Pulmonary effort is normal. No respiratory distress.     Breath sounds: No wheezing.  Abdominal:     General: There is no distension.  Musculoskeletal:     Cervical back: Neck supple.  Skin:    General: Skin is warm.     Capillary Refill: Capillary refill takes less than 2 seconds.     Findings: No rash.     Comments: Surgical site c/d/I, no erythema.  Neurological:     Mental Status: She is alert and oriented to person, place, and time.     Motor: No abnormal muscle tone.      ____________________________________________   LABS (all labs ordered are listed, but only abnormal results are displayed)  Labs Reviewed  CBC WITH DIFFERENTIAL/PLATELET - Abnormal; Notable for the following components:      Result Value   WBC 20.7 (*)    Neutro Abs 16.4 (*)    Monocytes Absolute 1.1 (*)    Abs  Immature Granulocytes 0.14 (*)    All other components within normal limits  COMPREHENSIVE METABOLIC  PANEL - Abnormal; Notable for the following components:   Potassium 2.8 (*)    Glucose, Bld 133 (*)    BUN 25 (*)    Creatinine, Ser 1.63 (*)    GFR, Estimated 32 (*)    All other components within normal limits  GLUCOSE, CSF - Abnormal; Notable for the following components:   Glucose, CSF <20 (*)    All other components within normal limits  PROTEIN, CSF - Abnormal; Notable for the following components:   Total  Protein, CSF >600 (*)    All other components within normal limits  CSF CELL COUNT WITH DIFFERENTIAL - Abnormal; Notable for the following components:   Color, CSF YELLOW (*)    Appearance, CSF TURBID (*)    RBC Count, CSF 8,877 (*)    WBC, CSF 14,350 (*)    All other components within normal limits  CBG MONITORING, ED - Abnormal; Notable for the following components:   Glucose-Capillary 132 (*)    All other components within normal limits  RESP PANEL BY RT-PCR (FLU A&B, COVID) [id number]  CSF CULTURE W GRAM STAIN  CULTURE, BLOOD (ROUTINE X 2)  CULTURE, BLOOD (ROUTINE X 2)  PROTIME-INR  MAGNESIUM  URINALYSIS, COMPLETE (UACMP) WITH MICROSCOPIC  BASIC METABOLIC PANEL  CBC  PATHOLOGIST SMEAR REVIEW  TROPONIN I (HIGH SENSITIVITY)  TROPONIN I (HIGH SENSITIVITY)    ____________________________________________  EKG: Normal sinus rhythm, jugular rate 83.  PR 130, QRS 80, QTc 568.  Nonspecific T wave abnormality.  No acute ST elevations ________________________________________  RADIOLOGY All imaging, including plain films, CT scans, and ultrasounds, independently reviewed by me, and interpretations confirmed via formal radiology reads.  ED MD interpretation:   CT head: No acute intracranial abnormality   Official radiology report(s): DG Chest 2 View  Result Date: 07/12/2021 CLINICAL DATA:  Headache. Shortness of breath. Patient off her blood thinners for 3 weeks. Had  recent back surgery. EXAM: CHEST - 2 VIEW COMPARISON:  06/08/2020 FINDINGS: Cardiac silhouette is normal in size. No mediastinal or hilar masses or evidence adenopathy. Linear opacities noted in the right middle lobe consistent with atelectasis or scarring. Lungs otherwise clear. Lung volumes are low. No pleural effusion or pneumothorax. Skeletal structures are demineralized, but intact. IMPRESSION: No acute cardiopulmonary disease. Electronically Signed   By: Lajean Manes M.D.   On: 07/12/2021 12:05   CT Head Wo Contrast  Result Date: 07/12/2021 CLINICAL DATA:  Headache. History of ruptured aneurysm. Recent back surgery. EXAM: CT HEAD WITHOUT CONTRAST TECHNIQUE: Contiguous axial images were obtained from the base of the skull through the vertex without intravenous contrast. COMPARISON:  None. FINDINGS: Brain: Expected cerebral volume loss for age. Mild low density in the periventricular white matter likely related to small vessel disease. Given limitations of beam hardening artifact from aneurysm clips, no mass lesion, hemorrhage, hydrocephalus, acute infarct, intra-axial, or extra-axial fluid collection. Vascular: Intracranial atherosclerosis. Skull: Left frontotemporal craniotomy for aneurysm clipping. Sinuses/Orbits: Normal imaged portions of the orbits and globes. Clear paranasal sinuses and mastoid air cells. Other: None. IMPRESSION: 1. Status post craniotomy for aneurysm clipping. Given beam hardening artifact from aneurysm clips, no acute intracranial abnormality. 2.  Cerebral atrophy and small vessel ischemic change. Electronically Signed   By: Abigail Miyamoto M.D.   On: 07/12/2021 06:19   CT Lumbar Spine Wo Contrast  Result Date: 07/12/2021 CLINICAL DATA:  Spinal fusion, lumbosacral, follow-up; post LP EXAM: CT LUMBAR SPINE WITHOUT CONTRAST TECHNIQUE: Multidetector CT imaging of the lumbar spine was performed without  intravenous contrast administration. Multiplanar CT image reconstructions were also  generated. COMPARISON:  05/22/2021 FINDINGS: Segmentation: 5 lumbar type vertebrae. Alignment: Stable grade 1 anterolisthesis at L4-L5 and L5-S1. Vertebrae: Stable vertebral body heights. There are new postoperative changes of left laminectomies at L3-L4, L4-L5, and L5-S1. Paraspinal and other soft tissues: Postoperative changes in the dorsal paraspinal soft tissues. Ill-defined density within the laminectomy beds. Few foci of air including within the left laminectomy bed at L3-L4 may be related to recent lumbar puncture. Aortic atherosclerosis. Disc levels: L1-L2: Disc bulge eccentric to the right. No significant canal or foraminal stenosis. L2-L3:  No significant canal or foraminal stenosis. L3-L4: Operative level. Disc bulge and facet hypertrophy. Canal not well evaluated but stenosis appears decreased. No significant right foraminal stenosis. Similar mild left foraminal stenosis. L4-L5: Operative level. Disc bulge and facet hypertrophy. Canal not well evaluated but stenosis appears decreased. No significant right foraminal stenosis. Similar mild left foraminal stenosis. L5-S1: Operative level. Disc bulge and facet hypertrophy. Canal is not well evaluated but does not appear to be significant stenosis. Similar mild to moderate foraminal stenosis. IMPRESSION: Postoperative changes at L3-L4, L4-L5, and L5-S1. Nonspecific density in the laminectomy beds may reflect fluid and granulation tissue. Infection is not well evaluated. The canal is not well evaluated at the operative levels but stenosis does appear decreased. Electronically Signed   By: Macy Mis M.D.   On: 07/12/2021 18:12   DG Lumbar Puncture Fluoro Guide  Result Date: 07/12/2021 Nolon Nations, MD     07/12/2021  3:10 PM PROCEDURE: Informed consent was obtained from the patient's daughter, Jeannene Patella prior to the procedure, including potential complications of headache, allergy, and pain.  With the patient prone, the lower back was prepped with  Betadine.  1% Lidocaine was used for local anesthesia.  Lumbar puncture was performed at the [L3-4] level using a [21] gauge needle with return of [bloody, cloudy, yellowish] CSF with an opening pressure of [7] cm water.  During collection, the fluid cleared and was cloudy-yellowish [.  Eight] ml of CSF were obtained for laboratory studies.  The patient tolerated the procedure well and there were no apparent complications.  IMPRESSION: [Status post fluoroscopic guided lumbar puncture at L3-4.  No apparent complications.]   ____________________________________________  PROCEDURES   Procedure(s) performed (including Critical Care):  .Lumbar Puncture  Date/Time: 07/12/2021 3:47 PM Performed by: Duffy Bruce, MD Authorized by: Duffy Bruce, MD   Consent:    Consent obtained:  Verbal   Consent given by:  Patient   Risks, benefits, and alternatives were discussed: yes     Risks discussed:  Bleeding, headache, infection, nerve damage, pain and repeat procedure   Alternatives discussed:  Alternative treatment and referral Universal protocol:    Procedure explained and questions answered to patient or proxy's satisfaction: yes     Relevant documents present and verified: yes     Test results available: yes     Imaging studies available: yes     Required blood products, implants, devices, and special equipment available: yes     Immediately prior to procedure a time out was called: yes     Site/side marked: yes     Patient identity confirmed:  Verbally with patient Pre-procedure details:    Procedure purpose:  Diagnostic   Preparation: Patient was prepped and draped in usual sterile fashion   Anesthesia:    Anesthesia method:  Local infiltration Procedure details:    Lumbar space:  L4-L5 interspace   Patient position:  L lateral decubitus   Needle gauge:  20   Needle type:  Spinal needle - Quincke tip   Needle length (in):  3.5   Number of attempts:  2   Total volume (ml):   0 Post-procedure details:    Puncture site:  Adhesive bandage applied   Procedure completion:  Tolerated well, no immediate complications Comments:     Terminated after no success x 2 attempts. Radiology called. No bleeding. Tolerated well. .Critical Care  Date/Time: 07/24/2021 11:23 PM Performed by: Duffy Bruce, MD Authorized by: Duffy Bruce, MD   Critical care provider statement:    Critical care time (minutes):  35   Critical care time was exclusive of:  Separately billable procedures and treating other patients and teaching time   Critical care was necessary to treat or prevent imminent or life-threatening deterioration of the following conditions:  Cardiac failure, circulatory failure and sepsis   Critical care was time spent personally by me on the following activities:  Development of treatment plan with patient or surrogate, discussions with consultants, evaluation of patient's response to treatment, examination of patient, obtaining history from patient or surrogate, ordering and performing treatments and interventions, ordering and review of laboratory studies, ordering and review of radiographic studies, pulse oximetry, re-evaluation of patient's condition and review of old charts   I assumed direction of critical care for this patient from another provider in my specialty: no    ____________________________________________  INITIAL IMPRESSION / MDM / [place] / ED COURSE  As part of my medical decision making, I reviewed the following data within the [place] notes reviewed and incorporated, Old chart reviewed, Notes from prior ED visits, and [place] Controlled Substance Database       *Latoya Freeman was evaluated in Emergency Department on 07/12/2021 for the symptoms described in the history of present illness. She was evaluated in the context of the global COVID-19 pandemic, which necessitated consideration that the patient might be at  risk for infection with the SARS-CoV-2 virus that causes COVID-19. Institutional protocols and algorithms that pertain to the evaluation of patients at risk for COVID-19 are in a state of rapid change based on information released by regulatory bodies including the CDC and federal and state organizations. These policies and algorithms were followed during the patient's care in the ED.  Some ED evaluations and interventions may be delayed as a result of limited staffing during the pandemic.*     Medical Decision Making: 80 year old female here with headache, back pain in the setting of recent surgery on her lumbosacral spine.  Clinically, differential includes primary headache, less likely CSF leak given timing, but also must consider subarachnoid, meningitis in the setting of her recent surgery.  She does have some neck stiffness.  Lab work shows significant leukocytosis as well as hypokalemia.  She is generally weak and having difficulty walking due to weakness.  Will admit for repletion of her potassium.  Dr. Cari Caraway consulted, recommends LP.  LP attempted by me but patient did not tolerate well due to discomfort and positioning from her back.  Will consult cardiology for evaluation.  I have ordered an empiric dose of abx and steroids to be given while awaiting results.  Patient updated and in agreement.. Would recommend close f/u of LP for SAH as well.  ____________________________________________  FINAL CLINICAL IMPRESSION(S) / ED DIAGNOSES  Final diagnoses:  Acute nonintractable headache, unspecified headache type  Leukocytosis, unspecified type     MEDICATIONS  GIVEN DURING THIS VISIT:  Medications  0.9 % NaCl with KCl 40 mEq / L  infusion ( Intravenous New Bag/Given 07/12/21 1539)  pantoprazole (PROTONIX) injection 40 mg (40 mg Intravenous Given 07/12/21 1535)  HYDROcodone-acetaminophen (NORCO/VICODIN) 5-325 MG per tablet 1 tablet (has no administration in time range)  citalopram  (CELEXA) tablet 20 mg (0 mg Oral Hold 07/12/21 1606)  levothyroxine (SYNTHROID) tablet 75 mcg (has no administration in time range)  bisacodyl (DULCOLAX) EC tablet 5 mg (has no administration in time range)  senna (SENOKOT) tablet 8.6 mg (has no administration in time range)  potassium chloride (KLOR-CON) CR tablet 10 mEq (0 mEq Oral Hold 07/12/21 1606)  loratadine (CLARITIN) tablet 10 mg (has no administration in time range)  acetaminophen (TYLENOL) tablet 650 mg (has no administration in time range)    Or  acetaminophen (TYLENOL) suppository 650 mg (has no administration in time range)  ondansetron (ZOFRAN) tablet 4 mg (has no administration in time range)    Or  ondansetron (ZOFRAN) injection 4 mg (has no administration in time range)  vancomycin (VANCOCIN) IVPB 1000 mg/200 mL premix (has no administration in time range)  meropenem (MERREM) 2 g in sodium chloride 0.9 % 100 mL IVPB (has no administration in time range)  sodium chloride 0.9 % bolus 1,000 mL (0 mLs Intravenous Stopped 07/12/21 1339)  morphine 4 MG/ML injection 4 mg (4 mg Intravenous Given 07/12/21 1057)  ondansetron (ZOFRAN) injection 4 mg (4 mg Intravenous Given 07/12/21 1057)  potassium chloride SA (KLOR-CON) CR tablet 40 mEq (40 mEq Oral Given 07/12/21 1108)  potassium chloride 10 mEq in 100 mL IVPB (0 mEq Intravenous Stopped 07/12/21 1529)  lidocaine-EPINEPHrine (XYLOCAINE W/EPI) 2 %-1:100000 (with pres) injection 20 mL (20 mLs Intradermal Given by Other 07/12/21 1100)  cefTRIAXone (ROCEPHIN) 2 g in sodium chloride 0.9 % 100 mL IVPB (0 g Intravenous Stopped 07/12/21 1529)  sodium chloride 0.9 % bolus 1,000 mL (0 mLs Intravenous Stopped 07/12/21 1544)  vancomycin (VANCOREADY) IVPB 2000 mg/400 mL (0 mg Intravenous Stopped 07/12/21 1906)  lidocaine (PF) (XYLOCAINE) 1 % injection 5 mL (5 mLs Other Given 07/12/21 1420)  dexamethasone (DECADRON) injection 10 mg (10 mg Intravenous Given 07/12/21 1610)     ED Discharge Orders     None         Note:  This document was prepared using Dragon voice recognition software and may include unintentional dictation errors.   Duffy Bruce, MD 07/12/21 Terrence Dupont, MD 07/24/21 [phone]

## 2021-07-12 NOTE — ED Notes (Signed)
Hospitalist at bedside with patient. Patient transported to have radiology perform LP. Daughter going with patient.

## 2021-07-12 NOTE — ED Notes (Signed)
Antibiotics given with one blood culture drawn. MD given permission.

## 2021-07-12 NOTE — ED Notes (Signed)
MD and RN at bedside for lumbar puncture.

## 2021-07-13 ENCOUNTER — Encounter: Payer: Self-pay | Admitting: Internal Medicine

## 2021-07-13 ENCOUNTER — Inpatient Hospital Stay: Payer: Medicare HMO

## 2021-07-13 DIAGNOSIS — G9782 Other postprocedural complications and disorders of nervous system: Secondary | ICD-10-CM

## 2021-07-13 DIAGNOSIS — E876 Hypokalemia: Secondary | ICD-10-CM | POA: Diagnosis not present

## 2021-07-13 DIAGNOSIS — G009 Bacterial meningitis, unspecified: Secondary | ICD-10-CM | POA: Diagnosis not present

## 2021-07-13 DIAGNOSIS — Z86711 Personal history of pulmonary embolism: Secondary | ICD-10-CM

## 2021-07-13 DIAGNOSIS — G038 Meningitis due to other specified causes: Secondary | ICD-10-CM

## 2021-07-13 DIAGNOSIS — R519 Headache, unspecified: Secondary | ICD-10-CM | POA: Diagnosis not present

## 2021-07-13 LAB — URINALYSIS, COMPLETE (UACMP) WITH MICROSCOPIC
Bilirubin Urine: NEGATIVE
Glucose, UA: NEGATIVE mg/dL
Hgb urine dipstick: NEGATIVE
Ketones, ur: 15 mg/dL — AB
Leukocytes,Ua: NEGATIVE
Nitrite: NEGATIVE
Protein, ur: NEGATIVE mg/dL
Specific Gravity, Urine: 1.025 (ref 1.005–1.030)
pH: 5 (ref 5.0–8.0)

## 2021-07-13 LAB — CBC
HCT: 34.9 % — ABNORMAL LOW (ref 36.0–46.0)
Hemoglobin: 12.5 g/dL (ref 12.0–15.0)
MCH: 32.9 pg (ref 26.0–34.0)
MCHC: 35.8 g/dL (ref 30.0–36.0)
MCV: 91.8 fL (ref 80.0–100.0)
Platelets: 235 10*3/uL (ref 150–400)
RBC: 3.8 MIL/uL — ABNORMAL LOW (ref 3.87–5.11)
RDW: 12.5 % (ref 11.5–15.5)
WBC: 22 10*3/uL — ABNORMAL HIGH (ref 4.0–10.5)
nRBC: 0 % (ref 0.0–0.2)

## 2021-07-13 LAB — BASIC METABOLIC PANEL
Anion gap: 5 (ref 5–15)
BUN: 19 mg/dL (ref 8–23)
CO2: 24 mmol/L (ref 22–32)
Calcium: 10.1 mg/dL (ref 8.9–10.3)
Chloride: 113 mmol/L — ABNORMAL HIGH (ref 98–111)
Creatinine, Ser: 1.2 mg/dL — ABNORMAL HIGH (ref 0.44–1.00)
GFR, Estimated: 46 mL/min — ABNORMAL LOW (ref >60)
Glucose, Bld: 133 mg/dL — ABNORMAL HIGH (ref 70–99)
Potassium: 4.3 mmol/L (ref 3.5–5.1)
Sodium: 142 mmol/L (ref 135–145)

## 2021-07-13 LAB — PATHOLOGIST SMEAR REVIEW

## 2021-07-13 MED ORDER — VANCOMYCIN HCL 1250 MG/250ML IV SOLN
1250.0000 mg | INTRAVENOUS | Status: DC
  Administered 2021-07-13 – 2021-07-14 (×2): 1250 mg via INTRAVENOUS
  Filled 2021-07-13 (×3): qty 250

## 2021-07-13 MED ORDER — IOHEXOL 350 MG/ML SOLN
75.0000 mL | Freq: Once | INTRAVENOUS | Status: AC | PRN
  Administered 2021-07-13: 75 mL via INTRAVENOUS

## 2021-07-13 MED ORDER — SODIUM CHLORIDE 0.9 % IV SOLN: INTRAVENOUS | Status: DC

## 2021-07-13 NOTE — Consult Note (Addendum)
NAME: Latoya Freeman  DOB: 1941-01-15  MRN: [id number]  Date/Time: 07/13/2021 11:58 AM  REQUESTING PROVIDER: Dr.Shah Subjective:  REASON FOR CONSULT: Meningitis ?pt is a limited historian as she is confused , chart reviewed Latoya Freeman is a 80 y.o. female with a history of cerebral aneurysm, recent  L3-l4 microdiscectomy L4-S1 laminectomyon 07/01/21 presented to the ED with headache on 07/12/21. Pt was recovering well from surgery , but however over the last few days she has developed headache which is severe now.It is 10/10 and shooting down to the back On 07/01/21 during the lumbar surgery there was dural breach due to a synovial cyst and it was sutured As per ED note no fever or chills Vitals 123/60, te,p 98, HR 88, sats 94% WBC 20.7, HB 13.7, cr 1.63 LP done and it showed 14,350 WBC, RBC 8877, Total protein > 600 and glucose < 20 Started on vanco and meropenem  Past Medical History:  Diagnosis Date   Aneurysm ([place]) 1980   Arthritis    Cancer ([place])    GERD (gastroesophageal reflux disease)    Hypertension 1980   Hypothyroidism 1999   Pneumonia    Pulmonary embolism ([place])    Shingles    Wears dentures    full upper and lower    Past Surgical History:  Procedure Laterality Date   BRAIN SURGERY     aneurysm   BREAST CYST ASPIRATION Left 2005   BREAST EXCISIONAL BIOPSY Left 2013   atypical ductal hyperplasia   BREAST SURGERY  2013   LF Breast Wide EXC    CHOLECYSTECTOMY  2004   COLONOSCOPY     COLONOSCOPY WITH PROPOFOL N/A 01/31/2018   Procedure: COLONOSCOPY WITH PROPOFOL;  Surgeon: Lollie Sails, MD;  Location: [facility] ENDOSCOPY;  Service: Endoscopy;  Laterality: N/A;   COLONOSCOPY WITH PROPOFOL N/A 08/06/2020   Procedure: COLONOSCOPY WITH PROPOFOL;  Surgeon: Virgel Manifold, MD;  Location: [facility] ENDOSCOPY;  Service: Endoscopy;  Laterality: N/A;   DILATION AND CURETTAGE OF UTERUS N/A 05/03/2019   Procedure: DILATATION AND CURETTAGE;  Surgeon: Gae Dry, MD;   Location: [facility] ORS;  Service: Gynecology;  Laterality: N/A;   ESOPHAGOGASTRODUODENOSCOPY N/A 01/31/2018   Procedure: ESOPHAGOGASTRODUODENOSCOPY (EGD);  Surgeon: Lollie Sails, MD;  Location: [facility] ENDOSCOPY;  Service: Endoscopy;  Laterality: N/A;   ESOPHAGOGASTRODUODENOSCOPY (EGD) WITH PROPOFOL N/A 08/06/2020   Procedure: ESOPHAGOGASTRODUODENOSCOPY (EGD) WITH PROPOFOL;  Surgeon: Virgel Manifold, MD;  Location: [facility] ENDOSCOPY;  Service: Endoscopy;  Laterality: N/A;   LUMBAR LAMINECTOMY/DECOMPRESSION MICRODISCECTOMY N/A 07/01/2021   Procedure: LEFT L3-4 MICRODSICECTOMY, L4-S1 DECOMPRESSION;  Surgeon: Meade Maw, MD;  Location: [facility] ORS;  Service: Neurosurgery;  Laterality: N/A;   SHOULDER ARTHROSCOPY Left 07/25/2018   Procedure: SHOULDER MINI OPEN ROTATOR CUFF REPAIR  BICEPS TENDOSIS ARTHROSCOPIC DISTAL CLAVICLE EXCISION  SUBACROMIAL DECOMP;  Surgeon: Leim Fabry, MD;  Location: [place];  Service: Orthopedics;  Laterality: Left;  [facility] WITH SPYDER SMITH & NEPHEW HEAD COIL ANCHOR FOOTPRINT ANCHOR QFIX ANCHOR   VISCERAL ANGIOGRAPHY N/A 08/14/2020   Procedure: VISCERAL ANGIOGRAPHY;  Surgeon: Algernon Huxley, MD;  Location: [place] CV LAB;  Service: Cardiovascular;  Laterality: N/A;    Social History   Socioeconomic History   Marital status: Divorced    Spouse name: Not on file   Number of children: Not on file   Years of education: Not on file   Highest education level: Not on file  Occupational History   Not on file  Tobacco Use  Smoking status: Former    Packs/day: 1.50    Years: 20.00    Pack years: 30.00    Types: Cigarettes    Quit date: 1990    Years since quitting: 32.6   Smokeless tobacco: Never  Vaping Use   Vaping Use: Never used  Substance and Sexual Activity   Alcohol use: No   Drug use: No   Sexual activity: Not on file  Other Topics Concern   Not on file  Social History Narrative   Lives alone   Social Determinants of Health    Financial Resource Strain: Not on file  Food Insecurity: Not on file  Transportation Needs: Not on file  Physical Activity: Not on file  Stress: Not on file  Social Connections: Not on file  Intimate Partner Violence: Not on file    Family History  Problem Relation Age of Onset   Breast cancer Neg Hx    Allergies  Allergen Reactions   Bee Venom Swelling   Shellfish Allergy Swelling   Tamoxifen Hives   Penicillins Hives, Rash and Other (See Comments)    Has patient had a PCN reaction causing immediate rash, facial/tongue/throat swelling, SOB or lightheadedness with hypotension: Unknown Has patient had a PCN reaction causing severe rash involving mucus membranes or skin necrosis: Unknown Has patient had a PCN reaction that required hospitalization: Unknown Has patient had a PCN reaction occurring within the last 10 years: No If all of the above answers are "NO", then may proceed with Cephalosporin use.    I? Current Facility-Administered Medications  Medication Dose Route Frequency Provider Last Rate Last Admin   0.9 %  sodium chloride infusion   Intravenous Continuous Max Sane, MD 100 mL/hr at 07/13/21 1025 New Bag at 07/13/21 1025   acetaminophen (TYLENOL) tablet 650 mg  650 mg Oral Q6H PRN Agbata, Tochukwu, MD       Or   acetaminophen (TYLENOL) suppository 650 mg  650 mg Rectal Q6H PRN Agbata, Tochukwu, MD       bisacodyl (DULCOLAX) EC tablet 5 mg  5 mg Oral Daily PRN Agbata, Tochukwu, MD       citalopram (CELEXA) tablet 20 mg  20 mg Oral Daily Agbata, Tochukwu, MD   20 mg at 07/13/21 1026   HYDROcodone-acetaminophen (NORCO/VICODIN) 5-325 MG per tablet 1 tablet  1 tablet Oral Q6H PRN Agbata, Tochukwu, MD   1 tablet at 07/13/21 0838   levothyroxine (SYNTHROID) tablet 75 mcg  75 mcg Oral Daily Agbata, Tochukwu, MD   75 mcg at 07/13/21 0838   loratadine (CLARITIN) tablet 10 mg  10 mg Oral Daily Agbata, Tochukwu, MD   10 mg at 07/13/21 1026   meropenem (MERREM) 2 g in sodium  chloride 0.9 % 100 mL IVPB  2 g Intravenous Q12H Agbata, Tochukwu, MD 200 mL/hr at 07/13/21 1151 2 g at 07/13/21 1151   ondansetron (ZOFRAN) tablet 4 mg  4 mg Oral Q6H PRN Agbata, Tochukwu, MD       Or   ondansetron (ZOFRAN) injection 4 mg  4 mg Intravenous Q6H PRN Agbata, Tochukwu, MD       pantoprazole (PROTONIX) injection 40 mg  40 mg Intravenous Q24H Agbata, Tochukwu, MD   40 mg at 07/12/21 1535   potassium chloride (KLOR-CON) CR tablet 10 mEq  10 mEq Oral Daily Agbata, Tochukwu, MD   10 mEq at 07/13/21 1026   senna (SENOKOT) tablet 8.6 mg  1 tablet Oral BID Agbata, Tochukwu, MD   8.6 mg  at 07/13/21 1025   vancomycin (VANCOREADY) IVPB 1250 mg/250 mL  1,250 mg Intravenous Q24H Oswald Hillock, RPH         Abtx:  Anti-infectives (From admission, onward)    Start     Dose/Rate Route Frequency Ordered Stop   07/13/21 1800  vancomycin (VANCOCIN) IVPB 1000 mg/200 mL premix  Status:  Discontinued        1,000 mg 200 mL/hr over 60 Minutes Intravenous Every 24 hours 07/12/21 1405 07/13/21 0852   07/13/21 1800  vancomycin (VANCOREADY) IVPB 1250 mg/250 mL        1,250 mg 166.7 mL/hr over 90 Minutes Intravenous Every 24 hours 07/13/21 0852     07/12/21 2200  cefTRIAXone (ROCEPHIN) 2 g in sodium chloride 0.9 % 100 mL IVPB  Status:  Discontinued        2 g 200 mL/hr over 30 Minutes Intravenous Every 12 hours 07/12/21 1411 07/12/21 1714   07/12/21 1800  meropenem (MERREM) 2 g in sodium chloride 0.9 % 100 mL IVPB        2 g 200 mL/hr over 30 Minutes Intravenous Every 12 hours 07/12/21 1706     07/12/21 1500  vancomycin (VANCOREADY) IVPB 2000 mg/400 mL        2,000 mg 200 mL/hr over 120 Minutes Intravenous  Once 07/12/21 1347 07/12/21 1906   07/12/21 1300  cefTRIAXone (ROCEPHIN) 2 g in sodium chloride 0.9 % 100 mL IVPB        2 g 200 mL/hr over 30 Minutes Intravenous  Once 07/12/21 1255 07/12/21 1529       REVIEW OF SYSTEMS:  pt is a limited historian Const: pt denies  fever, negative chills,  negative weight loss Eyes: negative diplopia or visual changes, negative eye pain ENT: negative coryza, negative sore throat Resp: negative cough, hemoptysis, dyspnea Cards: negative for chest pain, palpitations, lower extremity edema GU: negative for frequency, dysuria and hematuria GI: Negative for abdominal pain, diarrhea, bleeding, constipation Skin: negative for rash and pruritus Heme: negative for easy bruising and gum/nose bleeding MS: negative for myalgias, arthralgias, back pain and muscle weakness Neurolo:severe headache Psych: negative for feelings of anxiety, depression  Endocrine: negative for thyroid, diabetes Allergy/Immunology- nas above Objective:  VITALS:  BP 118/62 (BP Location: Right Arm)   Pulse 76   Temp 99 F (37.2 C) (Oral)   Resp 16   Ht 5\' 1"  (1.549 m)   Wt 88.5 kg   SpO2 95%   BMI 36.87 kg/m  PHYSICAL EXAM:  General: Awake, some confusion- oriented to person/place  Head: Normocephalic, without obvious abnormality, atraumatic. Eyes: Conjunctivae clear, anicteric sclerae. Pupils are equal ENT Nares normal. No drainage or sinus tenderness. Lips, mucosa, and tongue normal. No Thrush Neck: Supple, symmetrical, no adenopathy, thyroid: non tender no carotid bruit and no JVD. Back: lumbar surgical site clean with no discharge or erythema Lungs: Clear to auscultation bilaterally. No Wheezing or Rhonchi. No rales. Heart: Regular rate and rhythm, no murmur, rub or gallop. Abdomen: Soft, non-tender,not distended. Bowel sounds normal. No masses Extremities: atraumatic, no cyanosis. No edema. No clubbing Skin: No rashes or lesions. Or bruising Lymph: Cervical, supraclavicular normal. Neurologic: Grossly non-focal She was able to stand up and took a step with walker Pertinent Labs Lab Results CBC    Component Value Date/Time   WBC 22.0 (H) 07/13/2021 0624   RBC 3.80 (L) 07/13/2021 0624   HGB 12.5 07/13/2021 0624   HCT 34.9 (L) 07/13/2021 0624   PLT 235  07/13/2021 0624   MCV 91.8 07/13/2021 0624   MCH 32.9 07/13/2021 0624   MCHC 35.8 07/13/2021 0624   RDW 12.5 07/13/2021 0624   LYMPHSABS 2.8 07/12/2021 0533   MONOABS 1.1 (H) 07/12/2021 0533   EOSABS 0.2 07/12/2021 0533   BASOSABS 0.1 07/12/2021 0533    CMP Latest Ref Rng & Units 07/13/2021 07/12/2021 07/02/2021  Glucose 70 - 99 mg/dL 133(H) 133(H) -  BUN 8 - 23 mg/dL 19 25(H) -  Creatinine 0.44 - 1.00 mg/dL 1.20(H) 1.63(H) 1.42(H)  Sodium 135 - 145 mmol/L 142 138 -  Potassium 3.5 - 5.1 mmol/L 4.3 2.8(L) -  Chloride 98 - 111 mmol/L 113(H) 105 -  CO2 22 - 32 mmol/L 24 22 -  Calcium 8.9 - 10.3 mg/dL 10.1 10.2 -  Total Protein 6.5 - 8.1 g/dL - 7.2 -  Total Bilirubin 0.3 - 1.2 mg/dL - 0.9 -  Alkaline Phos 38 - 126 U/L - 53 -  AST 15 - 41 U/L - 20 -  ALT 0 - 44 U/L - 12 -      Microbiology: Recent Results (from the past 240 hour(s))  Resp Panel by RT-PCR (Flu A&B, Covid) Nasopharyngeal Swab     Status: None   Collection Time: 07/12/21 10:44 AM   Specimen: Nasopharyngeal Swab; Nasopharyngeal(NP) swabs in vial transport medium  Result Value Ref Range Status   SARS Coronavirus 2 by RT PCR NEGATIVE NEGATIVE Final    Comment: (NOTE) SARS-CoV-2 target nucleic acids are NOT DETECTED.  The SARS-CoV-2 RNA is generally detectable in upper respiratory specimens during the acute phase of infection. The lowest concentration of SARS-CoV-2 viral copies this assay can detect is 138 copies/mL. A negative result does not preclude SARS-Cov-2 infection and should not be used as the sole basis for treatment or other patient management decisions. A negative result may occur with  improper specimen collection/handling, submission of specimen other than nasopharyngeal swab, presence of viral mutation(s) within the areas targeted by this assay, and inadequate number of viral copies(<138 copies/mL). A negative result must be combined with clinical observations, patient history, and  epidemiological information. The expected result is Negative.  Fact Sheet for Patients:  EntrepreneurPulse.com.au  Fact Sheet for Healthcare Providers:  IncredibleEmployment.be  This test is no t yet approved or cleared by the [place] FDA and  has been authorized for detection and/or diagnosis of SARS-CoV-2 by FDA under an Emergency Use Authorization (EUA). This EUA will remain  in effect (meaning this test can be used) for the duration of the COVID-19 declaration under Section 564(b)(1) of the Act, 21 U.S.C.section 360bbb-3(b)(1), unless the authorization is terminated  or revoked sooner.       Influenza A by PCR NEGATIVE NEGATIVE Final   Influenza B by PCR NEGATIVE NEGATIVE Final    Comment: (NOTE) The Xpert Xpress SARS-CoV-2/FLU/RSV plus assay is intended as an aid in the diagnosis of influenza from Nasopharyngeal swab specimens and should not be used as a sole basis for treatment. Nasal washings and aspirates are unacceptable for Xpert Xpress SARS-CoV-2/FLU/RSV testing.  Fact Sheet for Patients: EntrepreneurPulse.com.au  Fact Sheet for Healthcare Providers: IncredibleEmployment.be  This test is not yet approved or cleared by the [place] FDA and has been authorized for detection and/or diagnosis of SARS-CoV-2 by FDA under an Emergency Use Authorization (EUA). This EUA will remain in effect (meaning this test can be used) for the duration of the COVID-19 declaration under Section 564(b)(1) of the Act, 21 U.S.C. section 360bbb-3(b)(1), unless  the authorization is terminated or revoked.  Performed at [facility], [address] [place], [place] [address]   Blood culture (routine x 2)     Status: None (Preliminary result)   Collection Time: 07/12/21  1:15 PM   Specimen: BLOOD  Result Value Ref Range Status   Specimen Description BLOOD RIGHT HAND  Final   Special Requests    Final    BOTTLES DRAWN AEROBIC AND ANAEROBIC Blood Culture adequate volume   Culture   Final    NO GROWTH < 24 HOURS Performed at [facility], [address] [place], [place] [address]    Report Status PENDING  Incomplete  CSF culture w Gram Stain     Status: None (Preliminary result)   Collection Time: 07/12/21  1:20 PM   Specimen: PATH Cytology CSF; Cerebrospinal Fluid  Result Value Ref Range Status   Specimen Description CSF TUBE 4  Final   Special Requests NONE  Final   Gram Stain   Final    NO ORGANISMS SEEN WBC SEEN RED BLOOD CELLS RESULT CALLED TO, READ BACK BY AND VERIFIED WITH: KATIE RAND AT 0102 ON 07/12/21 BY SS Performed at [facility], [address] [place], [place] [address]    Culture PENDING  Incomplete   Report Status PENDING  Incomplete    IMAGING RESULTS: Status post craniotomy for aneurysm clipping. Given beam hardening artifact from aneurysm clips, no acute intracranial abnormality.2.  Cerebral atrophy and small vessel ischemic change. I have personally reviewed the films CXR  ? Impression/Recommendation ? ?80 yr female with h/o cerebral aneursym s/p clipping, recent discectomy, dural breach presents with headache  Meningitis- neutrophilic pleocytosis R/o bacterial meningitis following recent laminectomy with dural breach R/o nosocomial meningitis Currently on vanco and meropenem PCN allergy- may change meropenem to cefepime later Will get Meningitis/encephalitis CSF PCR panel  R/o csf bleed with h/o cerebral aneursym and clipping- getting CTA   Recent lumbar laminectomy- surgical site looks fine  AKI on CKD  Hypothyroidism- on synthroid  ? ___________________________________________________ Discussed with  requesting provider Note:  This document was prepared using Dragon voice recognition software and may include unintentional dictation errors.

## 2021-07-13 NOTE — Progress Notes (Signed)
Neurosurgery Progress Note  Progress Note   Date: 07/13/2021  Interval History Ms Caven appears stable today. She does have some confusion but is oriented to place. She denies any current headache or leg pain.   CSF from lumbar puncture shows elevated WBC and all cultures currently without organism. She has elevated serum WBC.   Vital Signs: Pulse Rate:  [78-98] 78 (08/22 0600) Resp:  [17-25] 20 (08/22 0600) BP: (94-135)/(43-87) 124/72 (08/22 0600) SpO2:  [91 %-98 %] 94 % (08/22 0600) No data recorded.  Weight: 88.5 kg   Problem List Patient Active Problem List   Diagnosis Date Noted   Headache 07/12/2021   Dehydration 07/12/2021   Lumbar radiculopathy, chronic 07/01/2021   Iron deficiency anemia    Polyp of colon    Columnar-lined esophagus    Abdominal pain 08/05/2020   Celiac artery stenosis (HCC) 08/05/2020   Mesenteric ischemia ([place]) 07/17/2020   History of pulmonary embolism 04/29/2020   Anemia in chronic kidney disease 04/23/2020   Secondary hyperparathyroidism of renal origin ([place]) 04/23/2020   Urinary tract infection 04/23/2020   Benign breast lumps 02/23/2020   Brain aneurysm 02/23/2020   Hemorrhoids 02/23/2020   Left wrist tendonitis 02/23/2020   Menopausal symptoms 02/23/2020   Renal mass 02/23/2020   Hypercalcemia 12/18/2019   Hypertensive chronic kidney disease with stage 1 through stage 4 chronic kidney disease, or unspecified chronic kidney disease 12/18/2019   Hypokalemia 12/18/2019   Benign hypertension with CKD (chronic kidney disease) stage III ([place]) 12/18/2019   Cyst of left ovary 04/04/2019   Postmenopausal bleeding 04/04/2019   Endometrial thickening on ultrasound 04/04/2019   Pulmonary embolism (HCC)    GERD (gastroesophageal reflux disease) 08/07/2018   Hypertension 08/07/2018   Shingles 08/07/2018   Thyroid disease 08/07/2018   HCAP (healthcare-associated pneumonia) 07/27/2018   Obesity (BMI 30-39.9) 06/21/2018     Medications: Scheduled Meds:  citalopram  20 mg Oral Daily   levothyroxine  75 mcg Oral Daily   loratadine  10 mg Oral Daily   pantoprazole (PROTONIX) IV  40 mg Intravenous Q24H   potassium chloride  10 mEq Oral Daily   senna  1 tablet Oral BID   Continuous Infusions:  0.9 % NaCl with KCl 40 mEq / L 100 mL/hr at 07/13/21 0505   meropenem (MERREM) IV Stopped (07/12/21 2134)   vancomycin     PRN Meds:.acetaminophen **OR** acetaminophen, bisacodyl, HYDROcodone-acetaminophen, ondansetron **OR** ondansetron (ZOFRAN) IV  Labs:  Lab Results  Component Value Date   WBC 22.0 (H) 07/13/2021   WBC 20.7 (H) 07/12/2021   HCT 34.9 (L) 07/13/2021   HCT 38.5 07/12/2021   PLT 235 07/13/2021   PLT 329 07/12/2021    Lab Results  Component Value Date   INR 1.0 07/12/2021   APTT 26 04/30/2019    Lab Results  Component Value Date   NA 142 07/13/2021   NA 138 07/12/2021   K 4.3 07/13/2021   K 2.8 (L) 07/12/2021   K 3.0 (L) 02/14/2014   K 3.7 02/21/2012   BUN 19 07/13/2021   BUN 25 (H) 07/12/2021    Lab Results  Component Value Date   MG 1.9 07/12/2021   Exam:  Awake, oriented to self and place, did not know month 5/5 strength in bilateral lower extremities Sensation intact to light touch throughout Lumbar incision clean and without erythema, sutures in place  Imaging CT Lumbar Spine: Postoperative changes at L3-L4, L4-L5, and L5-S1. Nonspecific density in the laminectomy beds may reflect  fluid and granulation tissue. Infection is not well evaluated. The canal is not well evaluated at the operative levels but stenosis does appear decreased.  A/P: Ms Kaas is now POD#12 from a L3-S1 decompression.  There is concern for possible infection given labs but CT and wound show no signs of surgical infection.   - Antibiotics have been started, agree with medicine team on this and appreciate their management -Clear for ambulation when able - Sutures to be removed in 2 days - We  will continue to follow   Deetta Perla, MD

## 2021-07-13 NOTE — ED Notes (Signed)
Lab at the bedside 

## 2021-07-13 NOTE — Progress Notes (Signed)
[facility] [place] NAME: Latoya Freeman    MR#:  [id number]  PCP: [name], MD  DATE OF BIRTH:  03-28-1941  SUBJECTIVE:  CHIEF COMPLAINT:   Chief Complaint  Patient presents with  . Headache  Headache is improved today. No further fever. Feels weak REVIEW OF SYSTEMS:  Review of Systems  Constitutional:  Negative for diaphoresis, fever, malaise/fatigue and weight loss.  HENT:  Negative for ear discharge, ear pain, hearing loss, nosebleeds, sore throat and tinnitus.   Eyes:  Negative for blurred vision and pain.  Respiratory:  Negative for cough, hemoptysis, shortness of breath and wheezing.   Cardiovascular:  Negative for chest pain, palpitations, orthopnea and leg swelling.  Gastrointestinal:  Negative for abdominal pain, blood in stool, constipation, diarrhea, heartburn, nausea and vomiting.  Genitourinary:  Negative for dysuria, frequency and urgency.  Musculoskeletal:  Negative for back pain and myalgias.  Skin:  Negative for itching and rash.  Neurological:  Positive for headaches. Negative for dizziness, tingling, tremors, focal weakness, seizures and weakness.  Psychiatric/Behavioral:  Negative for depression. The patient is not nervous/anxious.   DRUG ALLERGIES:   Allergies  Allergen Reactions  . Bee Venom Swelling  . Shellfish Allergy Swelling  . Tamoxifen Hives  . Penicillins Hives, Rash and Other (See Comments)    Has patient had a PCN reaction causing immediate rash, facial/tongue/throat swelling, SOB or lightheadedness with hypotension: Unknown Has patient had a PCN reaction causing severe rash involving mucus membranes or skin necrosis: Unknown Has patient had a PCN reaction that required hospitalization: Unknown Has patient had a PCN reaction occurring within the last 10 years: No If all of the above answers are "NO", then may proceed with Cephalosporin use.    VITALS:  Blood pressure (!) 124/51, pulse 82, temperature  98.4 F (36.9 C), temperature source Oral, resp. rate 20, height 5\' 1"  (1.549 m), weight 88.5 kg, SpO2 97 %. PHYSICAL EXAMINATION:  Physical Exam 78 y f lying in the bed comfortably without any acute distress Eyes pupil equal round reactive to light and accommodation, no scleral icterus Lungs clear to auscultation bilaterally no wheezing rales rhonchi or crepitation Cardiovascular S1-S2 normal, no murmur rales or gallop Abdomen soft, benign Neuro alert and oriented, nonfocal Neck: No signs of infection or erythema around lumbar incision.  Suture in place LABORATORY PANEL:  Female CBC Recent Labs  Lab 07/13/21 0624  WBC 22.0*  HGB 12.5  HCT 34.9*  PLT 235   ------------------------------------------------------------------------------------------------------------------ Chemistries  Recent Labs  Lab 07/12/21 0533 07/12/21 1044 07/13/21 0624  NA 138  --  142  K 2.8*  --  4.3  CL 105  --  113*  CO2 22  --  24  GLUCOSE 133*  --  133*  BUN 25*  --  19  CREATININE 1.63*  --  1.20*  CALCIUM 10.2  --  10.1  MG  --  1.9  --   AST 20  --   --   ALT 12  --   --   ALKPHOS 53  --   --   BILITOT 0.9  --   --    MEDICATIONS:  Scheduled Meds: . citalopram  20 mg Oral Daily  . levothyroxine  75 mcg Oral Daily  . loratadine  10 mg Oral Daily  . pantoprazole (PROTONIX) IV  40 mg Intravenous Q24H  . senna  1 tablet Oral BID   Continuous Infusions: .  sodium chloride 100 mL/hr at 07/13/21 1025  . meropenem (MERREM) IV 2 g (07/13/21 1151)  . vancomycin     RADIOLOGY:  CT Lumbar Spine Wo Contrast  Result Date: 07/12/2021 CLINICAL DATA:  Spinal fusion, lumbosacral, follow-up; post LP EXAM: CT LUMBAR SPINE WITHOUT CONTRAST TECHNIQUE: Multidetector CT imaging of the lumbar spine was performed without intravenous contrast administration. Multiplanar CT image reconstructions were also generated. COMPARISON:  05/22/2021 FINDINGS: Segmentation: 5 lumbar type vertebrae. Alignment: Stable  grade 1 anterolisthesis at L4-L5 and L5-S1. Vertebrae: Stable vertebral body heights. There are new postoperative changes of left laminectomies at L3-L4, L4-L5, and L5-S1. Paraspinal and other soft tissues: Postoperative changes in the dorsal paraspinal soft tissues. Ill-defined density within the laminectomy beds. Few foci of air including within the left laminectomy bed at L3-L4 may be related to recent lumbar puncture. Aortic atherosclerosis. Disc levels: L1-L2: Disc bulge eccentric to the right. No significant canal or foraminal stenosis. L2-L3:  No significant canal or foraminal stenosis. L3-L4: Operative level. Disc bulge and facet hypertrophy. Canal not well evaluated but stenosis appears decreased. No significant right foraminal stenosis. Similar mild left foraminal stenosis. L4-L5: Operative level. Disc bulge and facet hypertrophy. Canal not well evaluated but stenosis appears decreased. No significant right foraminal stenosis. Similar mild left foraminal stenosis. L5-S1: Operative level. Disc bulge and facet hypertrophy. Canal is not well evaluated but does not appear to be significant stenosis. Similar mild to moderate foraminal stenosis. IMPRESSION: Postoperative changes at L3-L4, L4-L5, and L5-S1. Nonspecific density in the laminectomy beds may reflect fluid and granulation tissue. Infection is not well evaluated. The canal is not well evaluated at the operative levels but stenosis does appear decreased. Electronically Signed   By: Macy Mis M.D.   On: 07/12/2021 18:12   ASSESSMENT AND PLAN:  44 y f with k/h/o hypothyroidism, HTN, GERD, s/p recent L3-L4 & L4-L5 discectomy and L5-S1 decompression on 07/01/21 admitted for worst headache of life. CSF c/s bacterial meningitis and started on Abx  8/22: CTA head before restarting Eliquis per Neurosurgery, ID c/s for bacterial meningitis  Principal Problem:   Headache Active Problems:   GERD (gastroesophageal reflux disease)   Thyroid disease    History of pulmonary embolism   Hypokalemia   Obesity (BMI 30-39.9)   Benign hypertension with CKD (chronic kidney disease) stage III (HCC)   Dehydration  Suspected Bacterial Meningitis Headache is improving CSF with low glucose (< 20), WBC > 14K, Protein  >600 - continue meropenem and vanco for now ID c/s   History of pulmonary embolism Continue to hold Apixaban as CSF > 8K RBC - d/w neurosurgery Dr Lacinda Axon who recommends to hold till CTA head results  Hypokalemia - due to diuretics and poor PO intake - repleted and resolved. Switch IVF to just NS (without K)     Hypothyroidism - continue synthroid  Depression Continue celexa   Acute kidney injury with underlying CKD 3a - Holding Diuretics due to AKI and also was hypokalemic and normotensive -Creatinine improved from 1.63->1.2 with IV hydration  Generalized Weakness: Multifactorial, await PT, OT eval     Obesity Body mass index is 36.87 kg/m.  Net IO Since Admission: 1,122.81 mL [07/13/21 1707]      LOS: 1 day   Consultants: ID Neurosurgery  Micro Blood c/s: pending  CSF c/s: pending  Antibiotics: Meropenem 8/21 >> Vanco 8/21 >>   Status is: Inpatient  Remains inpatient appropriate because:Ongoing diagnostic testing needed not appropriate for outpatient work up  Dispo:  The patient is from: Home              Anticipated d/c is to: Home              Patient currently is not medically stable to d/c.   Difficult to place patient No       DVT prophylaxis:          SCDs.  Can resume Eliquis if CT angio head negative    Family Communication: Tried calling daughter on her cell phone but no luck   All the records are reviewed and case discussed with Nursing and TOC team. Management plans discussed with the patient, nursing and they are in agreement.  CODE STATUS: Full Code Level of care: Med-Surg  TOTAL TIME TAKING CARE OF THIS PATIENT: 35 minutes.   More than 50% of the time was spent in  counseling/coordination of care: YES  POSSIBLE D/C IN 3-4 DAYS, DEPENDING ON CLINICAL CONDITION.   Max Sane M.D on 07/13/2021 at 5:07 PM  [facility]   CC: Primary care physician; [name], MD  Note: This dictation was prepared with Dragon dictation along with smaller phrase technology. Any transcriptional errors that result from this process are unintentional.

## 2021-07-13 NOTE — ED Notes (Signed)
Pericare provided, sheet, chuck pad, and purewick changed. Canister emptied and new one placed to suction. Daughter remains at the bedside

## 2021-07-13 NOTE — Hospital Course (Signed)
64 y f with k/h/o hypothyroidism, HTN, GERD, s/p recent L3-L4 & L4-L5 discectomy and L5-S1 decompression on 07/01/21 admitted for worst headache of life. CSF c/s bacterial meningitis and started on Abx  8/22: CTA head before restarting Eliquis per Neurosurgery, ID c/s for bacterial meningitis

## 2021-07-13 NOTE — Consult Note (Signed)
Pharmacy Antibiotic Note  Latoya Freeman is a 80 y.o. female admitted on 07/12/2021 with potential meningitis.  Pharmacy has been consulted for vancomycin dosing.  Post-op day 11 from L3-S1 decompression with a CSF leak closed without issue. Presented with headache and some back pain. Neurology is following and is concerned for meningitis. CSF fluid had an elevated WBC, glucose < 20, protein > 600.    Plan: Pt received vancomycin 2 g x 1 in the ED. Plan to start vancomycin 1000 mg q24h but adjusted to 1250 mg q24H due to improvement in Scr. Do not use AUC dosing, use [place] vancomycin nomogram with a trough of 15-20. Plan to obtain vancomycin trough prior to the 4th dose.   Continue meropenem 2g q12H - renally adjusted.    Height: 5\' 1"  (154.9 cm) Weight: 88.5 kg (195 lb 1.7 oz) IBW/kg (Calculated) : 47.8  No data recorded.  Recent Labs  Lab 07/12/21 0533 07/13/21 0624  WBC 20.7* 22.0*  CREATININE 1.63* 1.20*     Estimated Creatinine Clearance: 38.5 mL/min (A) (by C-G formula based on SCr of 1.2 mg/dL (H)).    Allergies  Allergen Reactions   Bee Venom Swelling   Shellfish Allergy Swelling   Tamoxifen Hives   Penicillins Hives, Rash and Other (See Comments)    Has patient had a PCN reaction causing immediate rash, facial/tongue/throat swelling, SOB or lightheadedness with hypotension: Unknown Has patient had a PCN reaction causing severe rash involving mucus membranes or skin necrosis: Unknown Has patient had a PCN reaction that required hospitalization: Unknown Has patient had a PCN reaction occurring within the last 10 years: No If all of the above answers are "NO", then may proceed with Cephalosporin use.     Antimicrobials this admission: Vancomycin 0821 >>  Meropenem 08/21 >>  Microbiology results: 0821 BCx: pending  0821 CSF cx: pending  Thank you for allowing pharmacy to be a part of this patient's care.  Oswald Hillock, PharmD 07/13/2021 8:53 AM

## 2021-07-14 DIAGNOSIS — G9340 Encephalopathy, unspecified: Secondary | ICD-10-CM | POA: Diagnosis not present

## 2021-07-14 DIAGNOSIS — I129 Hypertensive chronic kidney disease with stage 1 through stage 4 chronic kidney disease, or unspecified chronic kidney disease: Secondary | ICD-10-CM | POA: Diagnosis not present

## 2021-07-14 DIAGNOSIS — E079 Disorder of thyroid, unspecified: Secondary | ICD-10-CM

## 2021-07-14 DIAGNOSIS — Z515 Encounter for palliative care: Secondary | ICD-10-CM | POA: Diagnosis not present

## 2021-07-14 DIAGNOSIS — Z66 Do not resuscitate: Secondary | ICD-10-CM | POA: Diagnosis not present

## 2021-07-14 DIAGNOSIS — E860 Dehydration: Secondary | ICD-10-CM

## 2021-07-14 DIAGNOSIS — D72829 Elevated white blood cell count, unspecified: Secondary | ICD-10-CM | POA: Diagnosis not present

## 2021-07-14 DIAGNOSIS — Z86711 Personal history of pulmonary embolism: Secondary | ICD-10-CM | POA: Diagnosis not present

## 2021-07-14 DIAGNOSIS — R519 Headache, unspecified: Secondary | ICD-10-CM | POA: Diagnosis not present

## 2021-07-14 DIAGNOSIS — E669 Obesity, unspecified: Secondary | ICD-10-CM

## 2021-07-14 LAB — BASIC METABOLIC PANEL
Anion gap: 7 (ref 5–15)
BUN: 17 mg/dL (ref 8–23)
CO2: 21 mmol/L — ABNORMAL LOW (ref 22–32)
Calcium: 10 mg/dL (ref 8.9–10.3)
Chloride: 112 mmol/L — ABNORMAL HIGH (ref 98–111)
Creatinine, Ser: 1.11 mg/dL — ABNORMAL HIGH (ref 0.44–1.00)
GFR, Estimated: 51 mL/min — ABNORMAL LOW (ref >60)
Glucose, Bld: 102 mg/dL — ABNORMAL HIGH (ref 70–99)
Potassium: 3.8 mmol/L (ref 3.5–5.1)
Sodium: 140 mmol/L (ref 135–145)

## 2021-07-14 LAB — CBC
HCT: 30.5 % — ABNORMAL LOW (ref 36.0–46.0)
Hemoglobin: 10.5 g/dL — ABNORMAL LOW (ref 12.0–15.0)
MCH: 31.5 pg (ref 26.0–34.0)
MCHC: 34.4 g/dL (ref 30.0–36.0)
MCV: 91.6 fL (ref 80.0–100.0)
Platelets: 235 10*3/uL (ref 150–400)
RBC: 3.33 MIL/uL — ABNORMAL LOW (ref 3.87–5.11)
RDW: 12.7 % (ref 11.5–15.5)
WBC: 19.5 10*3/uL — ABNORMAL HIGH (ref 4.0–10.5)
nRBC: 0 % (ref 0.0–0.2)

## 2021-07-14 MED ORDER — KETOROLAC TROMETHAMINE 15 MG/ML IJ SOLN
15.0000 mg | Freq: Three times a day (TID) | INTRAMUSCULAR | Status: AC | PRN
  Administered 2021-07-16: 15 mg via INTRAVENOUS
  Filled 2021-07-14: qty 1

## 2021-07-14 MED ORDER — APIXABAN 5 MG PO TABS
5.0000 mg | ORAL_TABLET | Freq: Two times a day (BID) | ORAL | Status: DC
  Administered 2021-07-14 – 2021-07-21 (×14): 5 mg via ORAL
  Filled 2021-07-14 (×14): qty 1

## 2021-07-14 NOTE — Progress Notes (Addendum)
PROGRESS NOTE    Latoya Freeman  MPN:[id number] DOB: [birth year] DOA: 07/12/2021 PCP: [name], MD   Brief Narrative: Taken from prior notes. 51 y f with k/h/o hypothyroidism, HTN, GERD, s/p recent L3-L4 & L4-L5 discectomy and L5-S1 decompression on 07/01/21 admitted for worst headache of life. CSF c/s bacterial meningitis, preliminary cultures growing gram-negative rods.  Infectious disease is also consulted and patient was started on broad-spectrum antibiotics.  Neurosurgery was consulted due to her history of recent L3-S1 decompression.  Per neurosurgery no sign of surgical infection.  Sutures still in place which needs to be removed in 1 to 2 days by neurosurgery.  Subjective: Patient was resting comfortably in chair when seen today.  Appears little slow to response, denies any headache, nausea or vomiting.  No neck stiffness. According to daughter she does not have any underlying dementia, but did endorse getting confusion and hallucinations during recent hospitalization. Some nursing concern of hallucinations yesterday.  Assessment & Plan:   Principal Problem:   Headache Active Problems:   GERD (gastroesophageal reflux disease)   Thyroid disease   History of pulmonary embolism   Hypokalemia   Obesity (BMI 30-39.9)   Benign hypertension with CKD (chronic kidney disease) stage III (HCC)   Dehydration  Suspected bacterial meningitis.  Some improvement in leukocytosis.  CSF cultures with some gram-negative rods-pending identification and susceptibility.  Blood cultures negative. History of recent spinal surgery, L3-S1 decompression. Infectious diseases on board. -Continue with meropenem. -ID might discontinue Vanc after confirming with microbiology lab. -Follow-up CSF culture results.  History of PE.  Eliquis was initially held due to elevated RBC on CSF.  Neurosurgery wants to have a CTA before resuming it which shows no acute abnormality, prior ICA aneurysm repaired. -We  will ask neurosurgery, when we can resume Eliquis-resumed after clearance from neurosurgery   Hypothyroidism. -Continue Synthroid  AKI with underlying CKD stage IIIa.  Creatinine improving and appears to be around his baseline now.  Patient received IV fluid. -Encourage p.o. hydration -Stop IV fluid.  Concern of delirium/mild underlying cognitive impairment.  Per daughter patient did not had any cognitive impairment before recent hospitalizations.  Do endorse having concern of delirium and hallucinations during recent hospitalization for surgery. -Delirium precautions -Continue to monitor  Generalized weakness. -PT/OT is recommending home health services.  Class II obesity. Estimated body mass index is 36.87 kg/m as calculated from the following:   Height as of this encounter: 5\' 1"  (1.549 m).   Weight as of this encounter: 88.5 kg.   Objective: Vitals:   07/13/21 1632 07/13/21 1922 07/14/21 0417 07/14/21 0741  BP: (!) 124/51 136/69 (!) 136/57 130/74  Pulse: 82 85 80 73  Resp: 20 20 20  (!) 24  Temp: 98.4 F (36.9 C) 99.2 F (37.3 C) 100 F (37.8 C) 99.6 F (37.6 C)  TempSrc: Oral Oral Oral Oral  SpO2: 97% 94% 95% 93%  Weight:      Height:        Intake/Output Summary (Last 24 hours) at 07/14/2021 1325 Last data filed at 07/14/2021 0616 Gross per 24 hour  Intake 2406.29 ml  Output 250 ml  Net 2156.29 ml   Filed Weights   07/12/21 0529  Weight: 88.5 kg    Examination:  General exam: Appears mildly cognitively impaired but calm and comfortable  Respiratory system: Clear to auscultation. Respiratory effort normal. Cardiovascular system: S1 & S2 heard, RRR.  Gastrointestinal system: Soft, nontender, nondistended, bowel sounds positive. Central nervous system: Slow to response but  able to answer questions appropriately. No apparent focal neurological deficits.  No neck stiffness or signs of meningeal irritation. Extremities: No edema, no cyanosis, pulses intact and  symmetrical. Psychiatry: Judgement and insight appear impaired.   DVT prophylaxis: SCDs Code Status: Full Family Communication: Daughter was updated on phone. Disposition Plan:  Status is: Inpatient  Remains inpatient appropriate because:Inpatient level of care appropriate due to severity of illness  Dispo: The patient is from: Home              Anticipated d/c is to: Home              Patient currently is not medically stable to d/c.   Difficult to place patient No               Level of care: Med-Surg  All the records are reviewed and case discussed with Care Management/Social Worker. Management plans discussed with the patient, nursing and they are in agreement.  Consultants:  Infectious disease  Procedures:  Lumbar puncture  Antimicrobials:  Meropenem Vancomycin  Data Reviewed: I have personally reviewed following labs and imaging studies  CBC: Recent Labs  Lab 07/12/21 0533 07/13/21 0624 07/14/21 0628  WBC 20.7* 22.0* 19.5*  NEUTROABS 16.4*  --   --   HGB 13.7 12.5 10.5*  HCT 38.5 34.9* 30.5*  MCV 88.5 91.8 91.6  PLT 329 235 546   Basic Metabolic Panel: Recent Labs  Lab 07/12/21 0533 07/12/21 1044 07/13/21 0624 07/14/21 0628  NA 138  --  142 140  K 2.8*  --  4.3 3.8  CL 105  --  113* 112*  CO2 22  --  24 21*  GLUCOSE 133*  --  133* 102*  BUN 25*  --  19 17  CREATININE 1.63*  --  1.20* 1.11*  CALCIUM 10.2  --  10.1 10.0  MG  --  1.9  --   --    GFR: Estimated Creatinine Clearance: 41.6 mL/min (A) (by C-G formula based on SCr of 1.11 mg/dL (H)). Liver Function Tests: Recent Labs  Lab 07/12/21 0533  AST 20  ALT 12  ALKPHOS 53  BILITOT 0.9  PROT 7.2  ALBUMIN 4.0   No results for input(s): LIPASE, AMYLASE in the last 168 hours. No results for input(s): AMMONIA in the last 168 hours. Coagulation Profile: Recent Labs  Lab 07/12/21 1044  INR 1.0   Cardiac Enzymes: No results for input(s): CKTOTAL, CKMB, CKMBINDEX, TROPONINI in the last  168 hours. BNP (last 3 results) No results for input(s): PROBNP in the last 8760 hours. HbA1C: No results for input(s): HGBA1C in the last 72 hours. CBG: Recent Labs  Lab 07/12/21 0546  GLUCAP 132*   Lipid Profile: No results for input(s): CHOL, HDL, LDLCALC, TRIG, CHOLHDL, LDLDIRECT in the last 72 hours. Thyroid Function Tests: No results for input(s): TSH, T4TOTAL, FREET4, T3FREE, THYROIDAB in the last 72 hours. Anemia Panel: No results for input(s): VITAMINB12, FOLATE, FERRITIN, TIBC, IRON, RETICCTPCT in the last 72 hours. Sepsis Labs: No results for input(s): PROCALCITON, LATICACIDVEN in the last 168 hours.  Recent Results (from the past 240 hour(s))  Resp Panel by RT-PCR (Flu A&B, Covid) Nasopharyngeal Swab     Status: None   Collection Time: 07/12/21 10:44 AM   Specimen: Nasopharyngeal Swab; Nasopharyngeal(NP) swabs in vial transport medium  Result Value Ref Range Status   SARS Coronavirus 2 by RT PCR NEGATIVE NEGATIVE Final    Comment: (NOTE) SARS-CoV-2 target nucleic acids are  NOT DETECTED.  The SARS-CoV-2 RNA is generally detectable in upper respiratory specimens during the acute phase of infection. The lowest concentration of SARS-CoV-2 viral copies this assay can detect is 138 copies/mL. A negative result does not preclude SARS-Cov-2 infection and should not be used as the sole basis for treatment or other patient management decisions. A negative result may occur with  improper specimen collection/handling, submission of specimen other than nasopharyngeal swab, presence of viral mutation(s) within the areas targeted by this assay, and inadequate number of viral copies(<138 copies/mL). A negative result must be combined with clinical observations, patient history, and epidemiological information. The expected result is Negative.  Fact Sheet for Patients:  EntrepreneurPulse.com.au  Fact Sheet for Healthcare Providers:   IncredibleEmployment.be  This test is no t yet approved or cleared by the [place] FDA and  has been authorized for detection and/or diagnosis of SARS-CoV-2 by FDA under an Emergency Use Authorization (EUA). This EUA will remain  in effect (meaning this test can be used) for the duration of the COVID-19 declaration under Section 564(b)(1) of the Act, 21 U.S.C.section 360bbb-3(b)(1), unless the authorization is terminated  or revoked sooner.       Influenza A by PCR NEGATIVE NEGATIVE Final   Influenza B by PCR NEGATIVE NEGATIVE Final    Comment: (NOTE) The Xpert Xpress SARS-CoV-2/FLU/RSV plus assay is intended as an aid in the diagnosis of influenza from Nasopharyngeal swab specimens and should not be used as a sole basis for treatment. Nasal washings and aspirates are unacceptable for Xpert Xpress SARS-CoV-2/FLU/RSV testing.  Fact Sheet for Patients: EntrepreneurPulse.com.au  Fact Sheet for Healthcare Providers: IncredibleEmployment.be  This test is not yet approved or cleared by the [place] FDA and has been authorized for detection and/or diagnosis of SARS-CoV-2 by FDA under an Emergency Use Authorization (EUA). This EUA will remain in effect (meaning this test can be used) for the duration of the COVID-19 declaration under Section 564(b)(1) of the Act, 21 U.S.C. section 360bbb-3(b)(1), unless the authorization is terminated or revoked.  Performed at [facility], [address] [place], [place] [address]   Blood culture (routine x 2)     Status: None (Preliminary result)   Collection Time: 07/12/21  1:15 PM   Specimen: BLOOD  Result Value Ref Range Status   Specimen Description BLOOD RIGHT HAND  Final   Special Requests   Final    BOTTLES DRAWN AEROBIC AND ANAEROBIC Blood Culture adequate volume   Culture   Final    NO GROWTH 2 DAYS Performed at [facility], [address],  [place], [place] [address]    Report Status PENDING  Incomplete  CSF culture w Gram Stain     Status: None (Preliminary result)   Collection Time: 07/12/21  1:20 PM   Specimen: PATH Cytology CSF; Cerebrospinal Fluid  Result Value Ref Range Status   Specimen Description   Final    CSF TUBE 4 Performed at [facility], [address] [place], [place] [address]    Special Requests   Final    NONE Performed at [facility], [address] [place], [place] [address]    Gram Stain   Final    NO ORGANISMS SEEN WBC SEEN RED BLOOD CELLS RESULT CALLED TO, READ BACK BY AND VERIFIED WITH: [place] AT 9381 ON 07/12/21 BY SS Performed at [facility], [address], [place], [place] [address]    Culture   Final  GRAM NEGATIVE RODS IDENTIFICATION AND SUSCEPTIBILITIES TO FOLLOW CRITICAL RESULT CALLED TO, READ BACK BY AND VERIFIED WITH: RN S.HINO AT 1941 ON 07/14/2021 BY T.SAAD. Performed at [facility], [place] [address] [place],  [address]    Report Status PENDING  Incomplete     Radiology Studies: CT ANGIO HEAD W OR WO CONTRAST  Result Date: 07/13/2021 CLINICAL DATA:  Initial evaluation for dizziness with headache status post spinal fusion. EXAM: CT ANGIOGRAPHY HEAD TECHNIQUE: Multidetector CT imaging of the head was performed using the standard protocol during bolus administration of intravenous contrast. Multiplanar CT image reconstructions and MIPs were obtained to evaluate the vascular anatomy. CONTRAST:  61mL OMNIPAQUE IOHEXOL 350 MG/ML SOLN COMPARISON:  Prior CT from 07/12/2021. FINDINGS: CT HEAD Brain: Generalized age-related cerebral atrophy with moderate chronic microvascular ischemic disease. Postoperative changes from previous left pterional craniotomy for surgical clipping of a left ICA aneurysm. Associated postoperative encephalomalacia seen within the anterior left temporal region. No acute intracranial hemorrhage. No acute large vessel  territory infarct. No mass lesion, mass effect or midline shift. No hydrocephalus or extra-axial fluid collection. Vascular: Streak artifact from aneurysm clips in the region of the cavernous left ICA. No visible hyperdense vessel. Scattered calcified atherosclerosis present at the skull base. Skull: Postoperative changes from prior left pterional craniotomy. Calvarium otherwise intact. No acute scalp soft tissue abnormality. Sinuses: Tiny right sphenoid sinus retention cyst. Paranasal sinuses are otherwise clear. No mastoid effusion. Other: Globes and orbital soft tissues demonstrate no acute finding. CTA HEAD Anterior circulation: Visualized distal cervical internal carotid arteries widely patent bilaterally. Petrous segments widely patent. Scattered calcified atheromatous plaque within the cavernous/supraclinoid ICAs without visible high-grade stenosis. Sequelae of prior surgical clipping of aneurysm at the supraclinoid left ICA. No visible residual aneurysm evident by CTA, although evaluation somewhat limited by streak artifact. A1 segments patent bilaterally. Normal anterior communicating artery complex. Anterior cerebral arteries patent to their distal aspects without stenosis. No M1 stenosis or occlusion. Normal MCA bifurcations. Distal MCA branches well perfused and symmetric. Posterior circulation: Vertebral arteries patent to the vertebrobasilar junction without stenosis. Left vertebral artery dominant. Both PICA origins patent and normal. Basilar patent to its distal aspect without stenosis. Superior cerebellar arteries patent bilaterally. Both PCAs primarily supplied via the basilar well perfused to their distal aspects. Venous sinuses: Grossly patent allowing for timing the contrast bolus. Anatomic variants: None significant. No other aneurysm seen elsewhere about the intracranial circulation. Review of the MIP images confirms the above findings. IMPRESSION: 1. Postoperative changes from previous left  pterional craniotomy for surgical clipping of a left ICA aneurysm. No visible residual aneurysm evident by CTA. 2. Mild for age intracranial atherosclerotic disease with no large vessel occlusion, hemodynamically significant stenosis, or other acute vascular abnormality. 3. Age-related cerebral atrophy with moderate chronic microvascular ischemic disease. No other acute intracranial abnormality. Electronically Signed   By: Jeannine Boga M.D.   On: 07/13/2021 22:39   CT Lumbar Spine Wo Contrast  Result Date: 07/12/2021 CLINICAL DATA:  Spinal fusion, lumbosacral, follow-up; post LP EXAM: CT LUMBAR SPINE WITHOUT CONTRAST TECHNIQUE: Multidetector CT imaging of the lumbar spine was performed without intravenous contrast administration. Multiplanar CT image reconstructions were also generated. COMPARISON:  05/22/2021 FINDINGS: Segmentation: 5 lumbar type vertebrae. Alignment: Stable grade 1 anterolisthesis at L4-L5 and L5-S1. Vertebrae: Stable vertebral body heights. There are new postoperative changes of left laminectomies at L3-L4, L4-L5, and L5-S1. Paraspinal and other soft tissues: Postoperative changes in the dorsal paraspinal soft tissues. Ill-defined density within  the laminectomy beds. Few foci of air including within the left laminectomy bed at L3-L4 may be related to recent lumbar puncture. Aortic atherosclerosis. Disc levels: L1-L2: Disc bulge eccentric to the right. No significant canal or foraminal stenosis. L2-L3:  No significant canal or foraminal stenosis. L3-L4: Operative level. Disc bulge and facet hypertrophy. Canal not well evaluated but stenosis appears decreased. No significant right foraminal stenosis. Similar mild left foraminal stenosis. L4-L5: Operative level. Disc bulge and facet hypertrophy. Canal not well evaluated but stenosis appears decreased. No significant right foraminal stenosis. Similar mild left foraminal stenosis. L5-S1: Operative level. Disc bulge and facet hypertrophy.  Canal is not well evaluated but does not appear to be significant stenosis. Similar mild to moderate foraminal stenosis. IMPRESSION: Postoperative changes at L3-L4, L4-L5, and L5-S1. Nonspecific density in the laminectomy beds may reflect fluid and granulation tissue. Infection is not well evaluated. The canal is not well evaluated at the operative levels but stenosis does appear decreased. Electronically Signed   By: Macy Mis M.D.   On: 07/12/2021 18:12   DG Lumbar Puncture Fluoro Guide  Result Date: 07/12/2021 CLINICAL DATA:  Headaches and elevated white count. Patient had lumbar surgery on 07/01/2021. EXAM: DIAGNOSTIC LUMBAR PUNCTURE UNDER FLUOROSCOPIC GUIDANCE COMPARISON:  Prior studies FLUOROSCOPY TIME:  Fluoroscopy Time:  1 minutes 6 second Number of Acquired Spot Images: 3 PROCEDURE: Informed consent was obtained from the patient's daughter, Jeannene Patella prior to the procedure, including potential complications of headache, allergy, and pain. With the patient prone, the lower back was prepped with Betadine. 1% Lidocaine was used for local anesthesia. Lumbar puncture was performed at the L3-4 level using a 21 gauge needle with return of bloody, cloudy, yellowish CSF with an opening pressure of 7 cm water. During collection, the fluid cleared and was cloudy-yellowish . Eight ml of CSF were obtained for laboratory studies. The patient tolerated the procedure well and there were no apparent complications. IMPRESSION: Status post fluoroscopic guided lumbar puncture at L3-4. No apparent complications. Electronically Signed   By: Nolon Nations M.D.   On: 07/12/2021 15:14    Scheduled Meds:  citalopram  20 mg Oral Daily   levothyroxine  75 mcg Oral Daily   loratadine  10 mg Oral Daily   pantoprazole (PROTONIX) IV  40 mg Intravenous Q24H   senna  1 tablet Oral BID   Continuous Infusions:  sodium chloride 100 mL/hr at 07/14/21 0850   meropenem (MERREM) IV 2 g (07/14/21 0854)   vancomycin Stopped  (07/13/21 2047)     LOS: 2 days   Time spent: 40 minutes. More than 50% of the time was spent in counseling/coordination of care  Lorella Nimrod, MD [facility]  If 7PM-7AM, please contact night-coverage [email or web address]  07/14/2021, 1:25 PM   This record has been created using Systems analyst. Errors have been sought and corrected,but may not always be located. Such creation errors do not reflect on the standard of care.

## 2021-07-14 NOTE — Progress Notes (Signed)
Critical lab results: CFS sample for culture growing Gram (-) rods.

## 2021-07-14 NOTE — Evaluation (Signed)
Physical Therapy Evaluation Patient Details Name: Latoya Freeman MRN: [id number] DOB: 01-Feb-1941 Today's Date: 07/14/2021   History of Present Illness  80 y.o. female with medical history significant for hypothyroidism, hypertension, GERD, status post recent L3-L4 discectomy and L4-L5 as well as L5-S1 decompression on 07/01/21 and was discharged home in stable condition.  She presents to the ER for evaluation of a headache which she described as the worst headache of her life.  Pt with signficant AMS during exam.  Clinical Impression  Pt with significant altered mental status per prior notes and telephone discussion with daughter.  She needed a lot of cuing to get/stay on task and showed poor general awareness with minimal ability to initiate conversation or tasks w/o repeated cues.  She ultimately was able to circumambulate the nurses' station safely but again needed consistent cuing to insure continued activity and directional awareness.  Given her confusion she is not currently safe to return home w/o assist - daughter able to assist some; hoping that mental status clears as physically she was able to manage relatively well.     Follow Up Recommendations Home health PT, intermittent vs 24/7 assist per mental status    Equipment Recommendations  None recommended by PT    Recommendations for Other Services       Precautions / Restrictions Precautions Precautions: Fall;Back Restrictions Weight Bearing Restrictions: No      Mobility  Bed Mobility Overal bed mobility: Needs Assistance Bed Mobility: Supine to Sit Rolling: Min assist         General bed mobility comments: Pt able to initiate movement to EOB and showed good effort, ultimately needed light HHA to elevate trunk, showed some awareness with neutral spine (though did need cuing)    Transfers Overall transfer level: Needs assistance Equipment used: Rolling walker (2 wheeled) Transfers: Sit to/from Stand Sit to Stand:  Min guard;Min assist         General transfer comment: Pt initially unable to rise to standing w/o assist, after cues for UE use and sequencing she did manage to rise from standard height bed  Ambulation/Gait Ambulation/Gait assistance: Min guard Gait Distance (Feet): 200 Feet Assistive device: Rolling walker (2 wheeled)       General Gait Details: Pt initially with slow, cautious gait with improving cadence with increased time.  Pt with stable vitals t/o the effort but needing constant cuing (directional/motivational more than anything).  Needed walker but was not heavily reliant on it.  Stairs            Wheelchair Mobility    Modified Rankin (Stroke Patients Only)       Balance Overall balance assessment: Needs assistance Sitting-balance support: No upper extremity supported;Feet supported Sitting balance-Leahy Scale: Good Sitting balance - Comments: Pt able to confidently maintain sitting EOB w/o assist     Standing balance-Leahy Scale: Fair Standing balance comment: UE use needed, no LOBs but poor overall awareness and consistent CGA due to this                             Pertinent Vitals/Pain Pain Assessment:  (reports not pain, however apparently has been complaing of significant headache pain)    Home Living Family/patient expects to be discharged to:: Private residence Living Arrangements: Alone Available Help at Discharge: Family;Available PRN/intermittently (could stay at daughter's home if she had to)   Home Access: Stairs to enter   Entrance Stairs-Number of Steps: 1 Home  Layout: One level Home Equipment:  (unable to answer, more than likely she has a walker)      Prior Function Level of Independence: Independent         Comments: Mod I with rolling walker for ambulation. daughter assist with meals - had gotten back to community mobility post recent surgery     Hand Dominance        Extremity/Trunk Assessment   Upper  Extremity Assessment Upper Extremity Assessment: Generalized weakness;Difficult to assess due to impaired cognition    Lower Extremity Assessment Lower Extremity Assessment: Generalized weakness;Difficult to assess due to impaired cognition       Communication   Communication: Expressive difficulties (very delayed (at times absent) responses that is not her baseline)  Cognition Arousal/Alertness: Awake/alert Behavior During Therapy: WFL for tasks assessed/performed Overall Cognitive Status: No family/caregiver present to determine baseline cognitive functioning                                        General Comments      Exercises     Assessment/Plan    PT Assessment Patient needs continued PT services  PT Problem List Decreased strength;Decreased activity tolerance;Decreased balance;Decreased mobility;Decreased safety awareness;Decreased knowledge of use of DME       PT Treatment Interventions DME instruction;Stair training;Gait training;Functional mobility training;Therapeutic activities;Therapeutic exercise;Balance training;Neuromuscular re-education    PT Goals (Current goals can be found in the Care Plan section)  Acute Rehab PT Goals Patient Stated Goal: to go home PT Goal Formulation: With patient Time For Goal Achievement: 07/28/21 Potential to Achieve Goals: Good    Frequency Min 2X/week   Barriers to discharge        Co-evaluation               AM-PAC PT "6 Clicks" Mobility  Outcome Measure Help needed turning from your back to your side while in a flat bed without using bedrails?: A Little Help needed moving from lying on your back to sitting on the side of a flat bed without using bedrails?: A Little Help needed moving to and from a bed to a chair (including a wheelchair)?: A Little Help needed standing up from a chair using your arms (e.g., wheelchair or bedside chair)?: A Little Help needed to walk in hospital room?: A  Little Help needed climbing 3-5 steps with a railing? : A Lot 6 Click Score: 17    End of Session Equipment Utilized During Treatment: Gait belt Activity Tolerance: Patient tolerated treatment well;No increased pain Patient left: in chair;with call bell/phone within reach;with chair alarm set Nurse Communication: Mobility status PT Visit Diagnosis: Unsteadiness on feet (R26.81);Muscle weakness (generalized) (M62.81)    Time: 5038-8828 PT Time Calculation (min) (ACUTE ONLY): 40 min   Charges:   PT Evaluation $PT Eval Low Complexity: 1 Low PT Treatments $Gait Training: 8-22 mins $Therapeutic Activity: 8-22 mins        Kreg Shropshire, DPT 07/14/2021, 11:24 AM

## 2021-07-14 NOTE — Consult Note (Signed)
Pharmacy Antibiotic Note  Latoya Freeman is a 80 y.o. female admitted on 07/12/2021 with potential meningitis.  Pharmacy has been consulted for vancomycin dosing.  Post-op day 11 from L3-S1 decompression with a CSF leak closed without issue. Presented with headache and some back pain. Neurology is following and is concerned for meningitis. CSF fluid had an elevated WBC, glucose < 20, protein > 600. Currently on day #2 of antibiotics. Renal function stable today with Cr 1.11.   CSF gram stain growing GNR, though per ID, continuing vancomycin until culture finalizes.   Plan: Continue 1250 mg q24 hour. No major change in renal function.   Do not use AUC dosing, use  vancomycin nomogram with a trough of 15-20.  Plan to get a vanc trough on 8/25, prior to the 4th dose.  Continue meropenem 2g q12H - renally adjusted.    Height: 5\' 1"  (154.9 cm) Weight: 88.5 kg (195 lb 1.7 oz) IBW/kg (Calculated) : 47.8  Temp (24hrs), Avg:99.2 F (37.3 C), Min:98.4 F (36.9 C), Max:100 F (37.8 C)  Recent Labs  Lab 07/12/21 0533 07/13/21 0624 07/14/21 0628  WBC 20.7* 22.0* 19.5*  CREATININE 1.63* 1.20* 1.11*     Estimated Creatinine Clearance: 41.6 mL/min (A) (by C-G formula based on SCr of 1.11 mg/dL (H)).    Allergies  Allergen Reactions   Bee Venom Swelling   Shellfish Allergy Swelling   Tamoxifen Hives   Penicillins Hives, Rash and Other (See Comments)    Has patient had a PCN reaction causing immediate rash, facial/tongue/throat swelling, SOB or lightheadedness with hypotension: Unknown Has patient had a PCN reaction causing severe rash involving mucus membranes or skin necrosis: Unknown Has patient had a PCN reaction that required hospitalization: Unknown Has patient had a PCN reaction occurring within the last 10 years: No If all of the above answers are "NO", then may proceed with Cephalosporin use.     Antimicrobials this admission: Vancomycin 0821 >>  Meropenem 08/21  >>  Microbiology results: 0821 BCx: pending  0821 CSF cx: GNR  Thank you for allowing pharmacy to be a part of this patient's care.   Wynelle Cleveland, PharmD Pharmacy Resident  07/14/2021 1:50 PM

## 2021-07-14 NOTE — Progress Notes (Signed)
Reviewed consult for IV. Noted IV obtained by RN. Consult cleared.

## 2021-07-14 NOTE — Progress Notes (Signed)
ID Patient is confused intermittently Does answer some questions appropriately No headache nausea or vomiting. On examination awake/alert BP (!) 152/86 (BP Location: Left Arm)   Pulse 79   Temp 98.5 F (36.9 C)   Resp 18   Ht 5\' 1"  (1.549 m)   Wt 88.5 kg   SpO2 93%   BMI 36.87 kg/m   No neck rigidity Chest bilateral air entry Heart sound S1-S2 Abdomen soft CNS nonfocal  Labs CBC Latest Ref Rng & Units 07/14/2021 07/13/2021 07/12/2021  WBC 4.0 - 10.5 K/uL 19.5(H) 22.0(H) 20.7(H)  Hemoglobin 12.0 - 15.0 g/dL 10.5(L) 12.5 13.7  Hematocrit 36.0 - 46.0 % 30.5(L) 34.9(L) 38.5  Platelets 150 - 400 K/uL 235 235 329    CMP Latest Ref Rng & Units 07/14/2021 07/13/2021 07/12/2021  Glucose 70 - 99 mg/dL 102(H) 133(H) 133(H)  BUN 8 - 23 mg/dL 17 19 25(H)  Creatinine 0.44 - 1.00 mg/dL 1.11(H) 1.20(H) 1.63(H)  Sodium 135 - 145 mmol/L 140 142 138  Potassium 3.5 - 5.1 mmol/L 3.8 4.3 2.8(L)  Chloride 98 - 111 mmol/L 112(H) 113(H) 105  CO2 22 - 32 mmol/L 21(L) 24 22  Calcium 8.9 - 10.3 mg/dL 10.0 10.1 10.2  Total Protein 6.5 - 8.1 g/dL - - 7.2  Total Bilirubin 0.3 - 1.2 mg/dL - - 0.9  Alkaline Phos 38 - 126 U/L - - 53  AST 15 - 41 U/L - - 20  ALT 0 - 44 U/L - - 12    Micro CSF: Culture still pending Gram-negative rod was seen.  But needs identification Blood culture no growth  Impression/recommendation For 80 year old female with history of cerebral aneurysm status post clipping, recent discectomy, dural breach presents with headache  Meningitis with neutrophilic pleocytosis Bacterial meningitis following recent laminectomy with a dural breach. This is nosocomial meningitis Currently on vancomycin and meropenem Once I have the final identification of the gram-negative rod then I can stop vancomycin.  Then I will be able to change meropenem to cefepime depending on susceptibility.  History of CSF bleed with history of cerebral aneurysm and clipping.  CT angio did not show any aneurysm  or bleeding.   Recent lumbar laminectomy.  Surgical site looks fine  AKI on CKD has improved.  Hypothyroidism on Synthroid  Leukocytosis persisting.  A dose of high-dose Decadron contributing to it.  Discussed the management with the care team.

## 2021-07-14 NOTE — Evaluation (Signed)
Occupational Therapy Evaluation Patient Details Name: Latoya Freeman MRN: [id number] DOB: [birth year] Today's Date: 07/14/2021    History of Present Illness 80 y.o. female with medical history significant for hypothyroidism, hypertension, GERD, status post recent L3-L4 discectomy and L4-L5 as well as L5-S1 decompression on 07/01/21 and was discharged home in stable condition.  She presents to the ER for evaluation of a headache which she described as the worst headache of her life.  Pt with signficant AMS during exam.   Clinical Impression   Pt seen for OT evaluation this date. Upon arrival to room, pt awake and seated upright in recliner. Pt A&Ox2 and reporting no pain. With increased time, pt able to respond to Y/N questions consistently, however infrequently responding to open-ended questions. PLOF and home set-up obtained via chart review in setting of pt with different cognition from baseline. Per chart review, pt was able to progress to being MOD-I with functional mobility and ADLs, with daughter assisting with meals, following recent admission.  Pt currently requires frequent verbal cues for initiating tasks and for maintaining attention to continue task. Due to pt's cognition and current functional impairments (See OT Problem List below), pt requires SUPERVISION/SET-UP for seated UB ADLs, MIN GUARD for standing grooming tasks, and MIN GUARD for functional mobility of short household distance with RW. Pt would benefit from additional skilled OT services to maximize return to PLOF and minimize risk of future falls, injury, caregiver burden, and readmission. Upon discharge, recommend HHOT services and 24/7 supervision/assistance.     Follow Up Recommendations  Home health OT;Supervision/Assistance - 24 hour    Equipment Recommendations  None recommended by OT       Precautions / Restrictions Precautions Precautions: Fall;Back Restrictions Weight Bearing Restrictions: No      Mobility  Bed Mobility Overal bed mobility: Needs Assistance Bed Mobility: Supine to Sit Rolling: Min assist         General bed mobility comments: not assessed, pt in recliner at beginning/end of session    Transfers Overall transfer level: Needs assistance Equipment used: Rolling walker (2 wheeled) Transfers: Sit to/from Stand Sit to Stand: Min guard;Min assist         General transfer comment: MIN A from bed and MIN GUARD from recliner. Requires verbal cues for safe hand placement with RW use    Balance Overall balance assessment: Needs assistance Sitting-balance support: No upper extremity supported;Feet supported Sitting balance-Leahy Scale: Good Sitting balance - Comments: Good sitting balance while carrying out grooming tasks at EOB   Standing balance support: Single extremity supported;During functional activity Standing balance-Leahy Scale: Fair Standing balance comment: With MIN GUARD, pt able to maintain balance however seeking unilateral UE support from countertop throughout standing hand hygiene                           ADL either performed or assessed with clinical judgement   ADL Overall ADL's : Needs assistance/impaired     Grooming: Wash/dry hands;Min guard;Standing;Brushing hair;Supervision/safety;Set up;Sitting                               Functional mobility during ADLs: Min guard;Rolling walker (+ verbal cues for navigating RW around tight spaces)        Pertinent Vitals/Pain Pain Assessment:  (reports not pain, however apparently has been complaing of significant headache pain)        Extremity/Trunk Assessment Upper Extremity  Assessment Upper Extremity Assessment: Generalized weakness;Difficult to assess due to impaired cognition   Lower Extremity Assessment Lower Extremity Assessment: Generalized weakness;Difficult to assess due to impaired cognition       Communication Communication Communication: Expressive  difficulties (very delayed (at times absent) responses that is not her baseline)   Cognition Arousal/Alertness: Awake/alert Behavior During Therapy: WFL for tasks assessed/performed Overall Cognitive Status: No family/caregiver present to determine baseline cognitive functioning                                 General Comments: Pt alert and oriented to self and that she is in a hospital, disoriented to date and situation. Requires frequent verbal cues for initiating tasks and for maintaining attention to continue task. With increased time, pt consistently responding to Y/N questions, however infrequently responding to open-ended questions              Home Living Family/patient expects to be discharged to:: Private residence Living Arrangements: Alone Available Help at Discharge: Family;Available PRN/intermittently (could stay at daughter's home if she had to) Type of Home: House Home Access: Stairs to enter [employer] of Steps: 1   Home Layout: One level     Bathroom Shower/Tub: Tub/shower unit         Home Equipment:  (unable to answer, more than likely she has a walker)   Additional Comments: PLOF and home set-up obtained from chart review d/t pt with impaired cognition      Prior Functioning/Environment Level of Independence: Independent        Comments: Mod I with rolling walker for ambulation, had gotten back to community mobility post recent surgery. Independent with ADLs, daughter assisted with meals        OT Problem List: Decreased range of motion;Decreased activity tolerance;Decreased safety awareness;Decreased knowledge of use of DME or AE;Decreased cognition      OT Treatment/Interventions: Self-care/ADL training;Therapeutic exercise;Energy conservation;DME and/or AE instruction;Therapeutic activities;Patient/family education;Balance training    OT Goals(Current goals can be found in the care plan section) Acute Rehab OT  Goals Patient Stated Goal: to go home OT Goal Formulation: Patient unable to participate in goal setting Time For Goal Achievement: 07/28/21 ADL Goals Pt Will Perform Lower Body Dressing: with supervision;sit to/from stand;with adaptive equipment Pt Will Transfer to Toilet: with supervision;ambulating;bedside commode Pt Will Perform Toileting - Clothing Manipulation and hygiene: with supervision;sitting/lateral leans  OT Frequency: Min 1X/week    AM-PAC OT "6 Clicks" Daily Activity     Outcome Measure Help from another person eating meals?: None Help from another person taking care of personal grooming?: A Little Help from another person toileting, which includes using toliet, bedpan, or urinal?: A Little Help from another person bathing (including washing, rinsing, drying)?: A Little Help from another person to put on and taking off regular upper body clothing?: A Little Help from another person to put on and taking off regular lower body clothing?: A Lot 6 Click Score: 18   End of Session Equipment Utilized During Treatment: Rolling walker;Gait belt Nurse Communication: Mobility status  Activity Tolerance: Patient tolerated treatment well Patient left: in chair;with call bell/phone within reach;with chair alarm set  OT Visit Diagnosis: Other abnormalities of gait and mobility (R26.89);Other symptoms and signs involving cognitive function                Time: 4259-5638 OT Time Calculation (min): 25 min Charges:  OT General Charges $OT  Visit: 1 Visit OT Evaluation $OT Eval Moderate Complexity: 1 Mod OT Treatments $Self Care/Home Management : 8-22 mins  Fredirick Maudlin, OTR/L [place]

## 2021-07-14 NOTE — Consult Note (Signed)
Consultation Note Date: 07/14/2021   Patient Name: Latoya Freeman  DOB: 1941-10-01  MRN: [id number]  Age / Sex: 80 y.o., female  PCP: [name], MD Referring Physician: Lorella Nimrod, MD  Reason for Consultation: Establishing goals of care and Psychosocial/spiritual support  HPI/Patient Profile: 80 y.o. female  admitted on 07/12/2021 with past medical history significant for hypothyroidism, hypertension, GERD, status post recent L3-L4 discectomy and L4-L5 as well as L5-S1 decompression on 07/01/21 and was discharged home with her daughter ( at that time daughter tells me there was some confusion that cleared once she got home)  She presents to the ER for evaluation of a headache which she described as the worst headache of her life and rated a 10 x 10 in intensity at its worst.  Headache woke the patient up out of her sleep at about 3 AM but she has had intermittent headaches over the last 1 week which she thought was related to her sinuses.  Her daughter also states that she has had sinus congestion.  Headache is associated with nausea and vomiting but she denies any blurred vision.  Does not have a history of chronic headaches.    She also complains of weakness as well as pain in her lower back.   In ED patient is afebrile but has marked leukocytosis of 20,000.  LP attempted but was unsuccessful patient placed on Rocephin and vancomycin empirically.  Patient admitted for further treatment and stabilization  CT scan of the head without contrast shows findings status postcraniotomy for aneurysm clipping.  Cerebral atrophy and small vessel ischemic change.    Patient and family face treatment option decisions, advanced directive decisions and anticipatory care needs.   Clinical Assessment and Goals of Care:  This NP Latoya Freeman reviewed medical records, received report from team, assessed the patient and  then meet at the patient's bedside  along with her daughter/Shelia Fogleman to discuss diagnosis, prognosis, GOC, EOL wishes disposition and options.  Patient is alert but disoriented and unable to participate fully in today's conversation.   Concept of Palliative Care was introduced as specialized medical care for people and their families living with serious illness.  If focuses on providing relief from the symptoms and stress of a serious illness.  The goal is to improve quality of life for both the patient and the family.    Values and goals of care important to patient and family were attempted to be elicited.  Created space and opportunity for family to explore thoughts and feelings regarding current medical situation.  Daughter/Sheila is tearful as she verbalizes an understanding and concern for the seriousness of her mother's current medical situation.  However at this time she remains hopeful for improvement and is open to all offered and available medical interventions to prolong life.   "Mom is tough"     A  discussion was had today regarding advanced directives.  Concepts specific to code status, artifical feeding and hydration, continued IV antibiotics and rehospitalization was had.  The difference  between a aggressive medical intervention path  and a palliative comfort care path for this patient at this time was had.    Education stressed the importance of continued conversation documentation of advance care planning wishes.  MOST form introduced and left for review   Questions and concerns addressed.  Patient  encouraged to call with questions or concerns.     PMT will continue to support holistically.          Patient's daughter Alinda Dooms is her main support and decision maker.        SUMMARY OF RECOMMENDATIONS    Code Status/Advance Care Planning: DNR-documented today No artificial feeding or hydration now or in the future.   Palliative Prophylaxis:   Aspiration, Bowel Regimen, Delirium Protocol, Frequent Pain Assessment, and Oral Care  Additional Recommendations (Limitations, Scope, Preferences): Full Scope Treatment  Psycho-social/Spiritual:  Desire for further Chaplaincy support:no Additional Recommendations: Education on Hospice  Prognosis:  Unable to determine  Discharge Planning: To Be Determined      Primary Diagnoses: Present on Admission:  Headache  Dehydration  GERD (gastroesophageal reflux disease)  Hypokalemia  Obesity (BMI 30-39.9)  Benign hypertension with CKD (chronic kidney disease) stage III (HCC)  Thyroid disease  History of pulmonary embolism   I have reviewed the medical record, interviewed the patient and family, and examined the patient. The following aspects are pertinent.  Past Medical History:  Diagnosis Date   Aneurysm ([place]) 1980   Arthritis    Cancer ([place])    GERD (gastroesophageal reflux disease)    Hypertension 1980   Hypothyroidism 1999   Pneumonia    Pulmonary embolism (HCC)    Shingles    Wears dentures    full upper and lower   Social History   Socioeconomic History   Marital status: Divorced    Spouse name: Not on file   Number of children: Not on file   Years of education: Not on file   Highest education level: Not on file  Occupational History   Not on file  Tobacco Use   Smoking status: Former    Packs/day: 1.50    Years: 20.00    Pack years: 30.00    Types: Cigarettes    Quit date: 72    Years since quitting: 32.6   Smokeless tobacco: Never  Vaping Use   Vaping Use: Never used  Substance and Sexual Activity   Alcohol use: No   Drug use: No   Sexual activity: Not on file  Other Topics Concern   Not on file  Social History Narrative   Lives alone   Social Determinants of Health   Financial Resource Strain: Not on file  Food Insecurity: Not on file  Transportation Needs: Not on file  Physical Activity: Not on file  Stress: Not on file  Social  Connections: Not on file   Family History  Problem Relation Age of Onset   Breast cancer Neg Hx    Scheduled Meds:  citalopram  20 mg Oral Daily   levothyroxine  75 mcg Oral Daily   loratadine  10 mg Oral Daily   pantoprazole (PROTONIX) IV  40 mg Intravenous Q24H   senna  1 tablet Oral BID   Continuous Infusions:  sodium chloride 100 mL/hr at 07/14/21 0850   meropenem (MERREM) IV 2 g (07/14/21 0854)   vancomycin Stopped (07/13/21 2047)   PRN Meds:.acetaminophen **OR** acetaminophen, bisacodyl, HYDROcodone-acetaminophen, ondansetron **OR** ondansetron (ZOFRAN) IV Medications Prior to Admission:  Prior to Admission  medications   Medication Sig Start Date End Date Taking? Authorizing Provider  amLODipine (NORVASC) 5 MG tablet Take 7.5 mg by mouth daily. 02/07/13   [provider]  apixaban (ELIQUIS) 5 MG TABS tablet Take 5 mg by mouth 2 (two) times daily.    [provider]  bisacodyl (DULCOLAX) 5 MG EC tablet Take 1 tablet (5 mg total) by mouth daily as needed for moderate constipation. 07/02/21   Loleta Dicker, PA  celecoxib (CELEBREX) 200 MG capsule Take 1 capsule (200 mg total) by mouth every 12 (twelve) hours. 07/02/21   Loleta Dicker, PA  cetirizine (ZYRTEC) 10 MG tablet Take 10 mg by mouth daily as needed for allergies.    [provider]  citalopram (CELEXA) 20 MG tablet Take 20 mg by mouth daily. 04/23/19   [provider]  clopidogrel (PLAVIX) 75 MG tablet Take 75 mg by mouth daily.    [provider]  HYDROcodone-acetaminophen (NORCO/VICODIN) 5-325 MG tablet Take 1 tablet by mouth every 6 (six) hours as needed for moderate pain or severe pain.    [provider]  levothyroxine (SYNTHROID, LEVOTHROID) 75 MCG tablet Take 75 mcg by mouth daily.    [provider]  losartan (COZAAR) 50 MG tablet Take 50 mg by mouth daily.     [provider]  omeprazole (PRILOSEC) 20 MG capsule Take 20 mg by mouth 2 (two)  times daily. 07/10/20   [provider]  potassium chloride (KLOR-CON) 10 MEQ tablet Take 10 mEq by mouth daily.    [provider]  senna (SENOKOT) 8.6 MG TABS tablet Take 1 tablet (8.6 mg total) by mouth 2 (two) times daily. 07/02/21   Loleta Dicker, PA  traMADol (ULTRAM) 50 MG tablet Take 50 mg by mouth every 6 (six) hours as needed for severe pain.    [provider]  triamterene-hydrochlorothiazide (MAXZIDE-25) 37.5-25 MG tablet Take 1 tablet by mouth daily. 07/10/20   [provider]   Allergies  Allergen Reactions   Bee Venom Swelling   Shellfish Allergy Swelling   Tamoxifen Hives   Penicillins Hives, Rash and Other (See Comments)    Has patient had a PCN reaction causing immediate rash, facial/tongue/throat swelling, SOB or lightheadedness with hypotension: Unknown Has patient had a PCN reaction causing severe rash involving mucus membranes or skin necrosis: Unknown Has patient had a PCN reaction that required hospitalization: Unknown Has patient had a PCN reaction occurring within the last 10 years: No If all of the above answers are "NO", then may proceed with Cephalosporin use.    Review of Systems  Unable to perform ROS: Mental status change   Physical Exam Cardiovascular:     Rate and Rhythm: Normal rate.  Pulmonary:     Effort: Pulmonary effort is normal.  Skin:    General: Skin is warm and dry.  Neurological:     Mental Status: She is alert. She is disoriented.    Vital Signs: BP 130/74 (BP Location: Right Arm)   Pulse 73   Temp 99.6 F (37.6 C) (Oral)   Resp (!) 24   Ht 5\' 1"  (1.549 m)   Wt 88.5 kg   SpO2 93%   BMI 36.87 kg/m  Pain Scale: 0-10   Pain Score: Asleep   SpO2: SpO2: 93 % O2 Device:SpO2: 93 % O2 Flow Rate: .O2 Flow Rate (L/min): 2 L/min  IO: Intake/output summary:  Intake/Output Summary (Last 24 hours) at 07/14/2021 1052 Last data filed  at 07/14/2021 8341 Gross per 24 hour  Intake 2907.14 ml   Output 250 ml  Net 2657.14 ml    LBM: Last BM Date:  (pt unsure) Baseline Weight: Weight: 88.5 kg Most recent weight: Weight: 88.5 kg     Palliative Assessment/Data: 30 %    Discussed with Dr Reesa Chew  Time In: 1300 Time Out: 1415 Time Total: 75 minutes Greater than 50%  of this time was spent counseling and coordinating care related to the above assessment and plan.  Signed by: Latoya Lessen, NP   Please contact Palliative Medicine Team phone at [phone] for questions and concerns.  For individual provider: See Shea Evans

## 2021-07-14 NOTE — Progress Notes (Signed)
Patient has a poor appetite. Had been vomiting today. Medicated her for pain but she keeps mourning. MD texted and informed about pain medication that works more that what she currently has.

## 2021-07-15 DIAGNOSIS — R519 Headache, unspecified: Secondary | ICD-10-CM | POA: Diagnosis not present

## 2021-07-15 DIAGNOSIS — I129 Hypertensive chronic kidney disease with stage 1 through stage 4 chronic kidney disease, or unspecified chronic kidney disease: Secondary | ICD-10-CM | POA: Diagnosis not present

## 2021-07-15 DIAGNOSIS — Z86711 Personal history of pulmonary embolism: Secondary | ICD-10-CM | POA: Diagnosis not present

## 2021-07-15 DIAGNOSIS — E860 Dehydration: Secondary | ICD-10-CM | POA: Diagnosis not present

## 2021-07-15 LAB — BASIC METABOLIC PANEL
Anion gap: 9 (ref 5–15)
BUN: 16 mg/dL (ref 8–23)
CO2: 24 mmol/L (ref 22–32)
Calcium: 9.7 mg/dL (ref 8.9–10.3)
Chloride: 107 mmol/L (ref 98–111)
Creatinine, Ser: 1.02 mg/dL — ABNORMAL HIGH (ref 0.44–1.00)
GFR, Estimated: 56 mL/min — ABNORMAL LOW (ref >60)
Glucose, Bld: 88 mg/dL (ref 70–99)
Potassium: 3.6 mmol/L (ref 3.5–5.1)
Sodium: 140 mmol/L (ref 135–145)

## 2021-07-15 LAB — CSF CULTURE W GRAM STAIN: Gram Stain: NONE SEEN

## 2021-07-15 LAB — CBC
HCT: 31.7 % — ABNORMAL LOW (ref 36.0–46.0)
Hemoglobin: 11 g/dL — ABNORMAL LOW (ref 12.0–15.0)
MCH: 32.8 pg (ref 26.0–34.0)
MCHC: 34.7 g/dL (ref 30.0–36.0)
MCV: 94.6 fL (ref 80.0–100.0)
Platelets: 223 10*3/uL (ref 150–400)
RBC: 3.35 MIL/uL — ABNORMAL LOW (ref 3.87–5.11)
RDW: 12.3 % (ref 11.5–15.5)
WBC: 14 10*3/uL — ABNORMAL HIGH (ref 4.0–10.5)
nRBC: 0 % (ref 0.0–0.2)

## 2021-07-15 LAB — MISC LABCORP TEST (SEND OUT): Labcorp test code: 2013305

## 2021-07-15 MED ORDER — CALCIUM CARBONATE ANTACID 500 MG PO CHEW
400.0000 mg | CHEWABLE_TABLET | Freq: Three times a day (TID) | ORAL | Status: AC
  Administered 2021-07-15 – 2021-07-16 (×3): 400 mg via ORAL
  Filled 2021-07-15 (×3): qty 2

## 2021-07-15 MED ORDER — AMLODIPINE BESYLATE 5 MG PO TABS
7.5000 mg | ORAL_TABLET | Freq: Every day | ORAL | Status: DC
  Administered 2021-07-15 – 2021-07-21 (×7): 7.5 mg via ORAL
  Filled 2021-07-15 (×6): qty 2

## 2021-07-15 MED ORDER — SODIUM CHLORIDE 0.9 % IV SOLN
2.0000 g | Freq: Two times a day (BID) | INTRAVENOUS | Status: DC
Start: 2021-07-16 — End: 2021-07-22
  Administered 2021-07-16 – 2021-07-21 (×11): 2 g via INTRAVENOUS
  Filled 2021-07-15 (×13): qty 2

## 2021-07-15 MED ORDER — LOSARTAN POTASSIUM 50 MG PO TABS
50.0000 mg | ORAL_TABLET | Freq: Every day | ORAL | Status: DC
  Administered 2021-07-15 – 2021-07-21 (×7): 50 mg via ORAL
  Filled 2021-07-15 (×6): qty 1

## 2021-07-15 NOTE — Progress Notes (Signed)
PROGRESS NOTE    Latoya Freeman  XTK:[id number] DOB: 1941-10-05 DOA: 07/12/2021 PCP: [name], MD   Brief Narrative: Taken from prior notes. 52 y f with k/h/o hypothyroidism, HTN, GERD, s/p recent L3-L4 & L4-L5 discectomy and L5-S1 decompression on 07/01/21 admitted for worst headache of life. CSF c/s bacterial meningitis, growing Enterobacter cloacae, nosocomial infection per ID.  Infectious disease de-escalated antibiotics to cefepime according to susceptibility results.  Initially received meropenem and vancomycin.  Neurosurgery was consulted due to her history of recent L3-S1 decompression.  Per neurosurgery no sign of surgical infection.  Sutures still in place which needs to be removed before discharge by neurosurgery.  Subjective: Patient continues to have quite slow response.  Also questions appropriately but slowly.  Denies any headache today.  Eating breakfast.  No nausea or vomiting.  Assessment & Plan:   Principal Problem:   Headache Active Problems:   GERD (gastroesophageal reflux disease)   Thyroid disease   History of pulmonary embolism   Hypokalemia   Obesity (BMI 30-39.9)   Benign hypertension with CKD (chronic kidney disease) stage III (HCC)   Dehydration   Leukocytosis  Suspected bacterial meningitis.  Some improvement in leukocytosis.  CSF cultures with Enterobacter closure, and nosocomial infection.  Blood cultures negative. History of recent spinal surgery, L3-S1 decompression. Infectious diseases on board. -Discontinue meropenem and vancomycin -Start her on cefepime-might need 2 to 3 weeks of antibiotics-ID will decide.  History of PE.  Eliquis was initially held due to elevated RBC on CSF.  Neurosurgery wants to have a CTA before resuming it which shows no acute abnormality, prior ICA aneurysm repaired. -Eliquis was resumed after having a CTA without any acute abnormality or bleeding.  Hypothyroidism. -Continue Synthroid  AKI with underlying CKD  stage IIIa.  Creatinine improving and appears to be around his baseline now.  Patient received IV fluid. -Encourage p.o. hydration  Concern of delirium/mild underlying cognitive impairment.  Per daughter patient did not had any cognitive impairment before recent hospitalizations.  Do endorse having concern of delirium and hallucinations during recent hospitalization for surgery. -Delirium precautions -Continue to monitor  Hypertension.  On multiple antihypertensives at home which were initially held due to softer blood pressure.  Blood pressure trending up. -Restart home amlodipine and losartan -Keep holding triamterene/HCTZ.-We will restart as needed  Generalized weakness. -PT/OT is recommending home health services, but daughter does not think that she will be able to provide the care she needs at this time.  Patient lives alone at baseline.  Class II obesity. Estimated body mass index is 36.87 kg/m as calculated from the following:   Height as of this encounter: 5\' 1"  (1.549 m).   Weight as of this encounter: 88.5 kg.   Objective: Vitals:   07/14/21 1611 07/14/21 1931 07/15/21 0428 07/15/21 0751  BP: 128/67 (!) 152/86 (!) 165/71 (!) 161/71  Pulse: 86 79 72 69  Resp: 16 18 18 16   Temp: 98.8 F (37.1 C) 98.5 F (36.9 C) 98.7 F (37.1 C) 100 F (37.8 C)  TempSrc: Oral  Oral Oral  SpO2: 90% 93% 99% 92%  Weight:      Height:        Intake/Output Summary (Last 24 hours) at 07/15/2021 1423 Last data filed at 07/15/2021 0753 Gross per 24 hour  Intake 1491.43 ml  Output 300 ml  Net 1191.43 ml    Filed Weights   07/12/21 0529  Weight: 88.5 kg    Examination:  General.  Chronically ill-appearing elderly  lady had slow responses, in no acute distress. Pulmonary.  Lungs clear bilaterally, normal respiratory effort. CV.  Regular rate and rhythm, no JVD, rub or murmur. Abdomen.  Soft, nontender, nondistended, BS positive. CNS.  Alert and oriented .  No focal neurologic  deficit. Extremities.  No edema, no cyanosis, pulses intact and symmetrical. Psychiatry.  Judgment and insight appears mildly impaired.  DVT prophylaxis: SCDs Code Status: Full Family Communication: Daughter was updated on phone. Disposition Plan:  Status is: Inpatient  Remains inpatient appropriate because:Inpatient level of care appropriate due to severity of illness  Dispo: The patient is from: Home              Anticipated d/c is to: To be determined.              Patient currently is not medically stable to d/c.   Difficult to place patient No               Level of care: Med-Surg  All the records are reviewed and case discussed with Care Management/Social Worker. Management plans discussed with the patient, nursing and they are in agreement.  Consultants:  Infectious disease  Procedures:  Lumbar puncture  Antimicrobials:  Cefepime  Data Reviewed: I have personally reviewed following labs and imaging studies  CBC: Recent Labs  Lab 07/12/21 0533 07/13/21 0624 07/14/21 0628 07/15/21 0408  WBC 20.7* 22.0* 19.5* 14.0*  NEUTROABS 16.4*  --   --   --   HGB 13.7 12.5 10.5* 11.0*  HCT 38.5 34.9* 30.5* 31.7*  MCV 88.5 91.8 91.6 94.6  PLT 329 235 235 629    Basic Metabolic Panel: Recent Labs  Lab 07/12/21 0533 07/12/21 1044 07/13/21 0624 07/14/21 0628 07/15/21 0408  NA 138  --  142 140 140  K 2.8*  --  4.3 3.8 3.6  CL 105  --  113* 112* 107  CO2 22  --  24 21* 24  GLUCOSE 133*  --  133* 102* 88  BUN 25*  --  19 17 16   CREATININE 1.63*  --  1.20* 1.11* 1.02*  CALCIUM 10.2  --  10.1 10.0 9.7  MG  --  1.9  --   --   --     GFR: Estimated Creatinine Clearance: 45.3 mL/min (A) (by C-G formula based on SCr of 1.02 mg/dL (H)). Liver Function Tests: Recent Labs  Lab 07/12/21 0533  AST 20  ALT 12  ALKPHOS 53  BILITOT 0.9  PROT 7.2  ALBUMIN 4.0    No results for input(s): LIPASE, AMYLASE in the last 168 hours. No results for input(s): AMMONIA in the  last 168 hours. Coagulation Profile: Recent Labs  Lab 07/12/21 1044  INR 1.0    Cardiac Enzymes: No results for input(s): CKTOTAL, CKMB, CKMBINDEX, TROPONINI in the last 168 hours. BNP (last 3 results) No results for input(s): PROBNP in the last 8760 hours. HbA1C: No results for input(s): HGBA1C in the last 72 hours. CBG: Recent Labs  Lab 07/12/21 0546  GLUCAP 132*    Lipid Profile: No results for input(s): CHOL, HDL, LDLCALC, TRIG, CHOLHDL, LDLDIRECT in the last 72 hours. Thyroid Function Tests: No results for input(s): TSH, T4TOTAL, FREET4, T3FREE, THYROIDAB in the last 72 hours. Anemia Panel: No results for input(s): VITAMINB12, FOLATE, FERRITIN, TIBC, IRON, RETICCTPCT in the last 72 hours. Sepsis Labs: No results for input(s): PROCALCITON, LATICACIDVEN in the last 168 hours.  Recent Results (from the past 240 hour(s))  Resp Panel by  RT-PCR (Flu A&B, Covid) Nasopharyngeal Swab     Status: None   Collection Time: 07/12/21 10:44 AM   Specimen: Nasopharyngeal Swab; Nasopharyngeal(NP) swabs in vial transport medium  Result Value Ref Range Status   SARS Coronavirus 2 by RT PCR NEGATIVE NEGATIVE Final    Comment: (NOTE) SARS-CoV-2 target nucleic acids are NOT DETECTED.  The SARS-CoV-2 RNA is generally detectable in upper respiratory specimens during the acute phase of infection. The lowest concentration of SARS-CoV-2 viral copies this assay can detect is 138 copies/mL. A negative result does not preclude SARS-Cov-2 infection and should not be used as the sole basis for treatment or other patient management decisions. A negative result may occur with  improper specimen collection/handling, submission of specimen other than nasopharyngeal swab, presence of viral mutation(s) within the areas targeted by this assay, and inadequate number of viral copies(<138 copies/mL). A negative result must be combined with clinical observations, patient history, and  epidemiological information. The expected result is Negative.  Fact Sheet for Patients:  EntrepreneurPulse.com.au  Fact Sheet for Healthcare Providers:  IncredibleEmployment.be  This test is no t yet approved or cleared by the [place] FDA and  has been authorized for detection and/or diagnosis of SARS-CoV-2 by FDA under an Emergency Use Authorization (EUA). This EUA will remain  in effect (meaning this test can be used) for the duration of the COVID-19 declaration under Section 564(b)(1) of the Act, 21 U.S.C.section 360bbb-3(b)(1), unless the authorization is terminated  or revoked sooner.       Influenza A by PCR NEGATIVE NEGATIVE Final   Influenza B by PCR NEGATIVE NEGATIVE Final    Comment: (NOTE) The Xpert Xpress SARS-CoV-2/FLU/RSV plus assay is intended as an aid in the diagnosis of influenza from Nasopharyngeal swab specimens and should not be used as a sole basis for treatment. Nasal washings and aspirates are unacceptable for Xpert Xpress SARS-CoV-2/FLU/RSV testing.  Fact Sheet for Patients: EntrepreneurPulse.com.au  Fact Sheet for Healthcare Providers: IncredibleEmployment.be  This test is not yet approved or cleared by the [place] FDA and has been authorized for detection and/or diagnosis of SARS-CoV-2 by FDA under an Emergency Use Authorization (EUA). This EUA will remain in effect (meaning this test can be used) for the duration of the COVID-19 declaration under Section 564(b)(1) of the Act, 21 U.S.C. section 360bbb-3(b)(1), unless the authorization is terminated or revoked.  Performed at [facility], [address] [place], [place] [address]   Blood culture (routine x 2)     Status: None (Preliminary result)   Collection Time: 07/12/21  1:15 PM   Specimen: BLOOD  Result Value Ref Range Status   Specimen Description BLOOD RIGHT HAND  Final   Special Requests    Final    BOTTLES DRAWN AEROBIC AND ANAEROBIC Blood Culture adequate volume   Culture   Final    NO GROWTH 3 DAYS Performed at [facility], [address] [place], [place] [address]    Report Status PENDING  Incomplete  CSF culture w Gram Stain     Status: Abnormal   Collection Time: 07/12/21  1:20 PM   Specimen: PATH Cytology CSF; Cerebrospinal Fluid  Result Value Ref Range Status   Specimen Description   Final    CSF TUBE 4 Performed at [facility], [address], [place], [place] [address]    Special Requests   Final    NONE Performed at [facility], [address], [place], [place] [address]  Gram Stain   Final    NO ORGANISMS SEEN WBC SEEN RED BLOOD CELLS RESULT CALLED TO, READ BACK BY AND VERIFIED WITH: KATIE RAND AT 8527 ON 07/12/21 BY SS Performed at [facility], [address] [place], [place] [address]    Culture (A)  Final    ENTEROBACTER CLOACAE CRITICAL RESULT CALLED TO, READ BACK BY AND VERIFIED WITH: RN S.HINO AT 1158 ON 07/14/2021 BY T.SAAD. Performed at [facility], [place] [address], [place], [place] [address]    Report Status 07/15/2021 FINAL  Final   Organism ID, Bacteria ENTEROBACTER CLOACAE  Final      Susceptibility   Enterobacter cloacae - MIC*    CEFAZOLIN >=64 RESISTANT Resistant     CEFEPIME <=0.12 SENSITIVE Sensitive     CEFTAZIDIME <=1 SENSITIVE Sensitive     CIPROFLOXACIN <=0.25 SENSITIVE Sensitive     GENTAMICIN <=1 SENSITIVE Sensitive     IMIPENEM 0.5 SENSITIVE Sensitive     TRIMETH/SULFA <=20 SENSITIVE Sensitive     PIP/TAZO <=4 SENSITIVE Sensitive     * ENTEROBACTER CLOACAE  Culture, blood (Routine X 2) w Reflex to ID Panel     Status: None (Preliminary result)   Collection Time: 07/14/21  6:28 AM   Specimen: BLOOD  Result Value Ref Range Status   Specimen Description BLOOD RIGHT HAND  Final   Special Requests   Final    BOTTLES DRAWN AEROBIC AND ANAEROBIC Blood Culture adequate  volume   Culture   Final    NO GROWTH 1 DAY Performed at [facility], [address], [place], [place] [address]    Report Status PENDING  Incomplete      Radiology Studies: CT ANGIO HEAD W OR WO CONTRAST  Result Date: 07/13/2021 CLINICAL DATA:  Initial evaluation for dizziness with headache status post spinal fusion. EXAM: CT ANGIOGRAPHY HEAD TECHNIQUE: Multidetector CT imaging of the head was performed using the standard protocol during bolus administration of intravenous contrast. Multiplanar CT image reconstructions and MIPs were obtained to evaluate the vascular anatomy. CONTRAST:  12mL OMNIPAQUE IOHEXOL 350 MG/ML SOLN COMPARISON:  Prior CT from 07/12/2021. FINDINGS: CT HEAD Brain: Generalized age-related cerebral atrophy with moderate chronic microvascular ischemic disease. Postoperative changes from previous left pterional craniotomy for surgical clipping of a left ICA aneurysm. Associated postoperative encephalomalacia seen within the anterior left temporal region. No acute intracranial hemorrhage. No acute large vessel territory infarct. No mass lesion, mass effect or midline shift. No hydrocephalus or extra-axial fluid collection. Vascular: Streak artifact from aneurysm clips in the region of the cavernous left ICA. No visible hyperdense vessel. Scattered calcified atherosclerosis present at the skull base. Skull: Postoperative changes from prior left pterional craniotomy. Calvarium otherwise intact. No acute scalp soft tissue abnormality. Sinuses: Tiny right sphenoid sinus retention cyst. Paranasal sinuses are otherwise clear. No mastoid effusion. Other: Globes and orbital soft tissues demonstrate no acute finding. CTA HEAD Anterior circulation: Visualized distal cervical internal carotid arteries widely patent bilaterally. Petrous segments widely patent. Scattered calcified atheromatous plaque within the cavernous/supraclinoid ICAs without visible high-grade stenosis. Sequelae of  prior surgical clipping of aneurysm at the supraclinoid left ICA. No visible residual aneurysm evident by CTA, although evaluation somewhat limited by streak artifact. A1 segments patent bilaterally. Normal anterior communicating artery complex. Anterior cerebral arteries patent to their distal aspects without stenosis. No M1 stenosis or occlusion. Normal MCA bifurcations. Distal MCA branches well perfused and symmetric. Posterior circulation: Vertebral arteries patent to the vertebrobasilar junction without stenosis.  Left vertebral artery dominant. Both PICA origins patent and normal. Basilar patent to its distal aspect without stenosis. Superior cerebellar arteries patent bilaterally. Both PCAs primarily supplied via the basilar well perfused to their distal aspects. Venous sinuses: Grossly patent allowing for timing the contrast bolus. Anatomic variants: None significant. No other aneurysm seen elsewhere about the intracranial circulation. Review of the MIP images confirms the above findings. IMPRESSION: 1. Postoperative changes from previous left pterional craniotomy for surgical clipping of a left ICA aneurysm. No visible residual aneurysm evident by CTA. 2. Mild for age intracranial atherosclerotic disease with no large vessel occlusion, hemodynamically significant stenosis, or other acute vascular abnormality. 3. Age-related cerebral atrophy with moderate chronic microvascular ischemic disease. No other acute intracranial abnormality. Electronically Signed   By: Jeannine Boga M.D.   On: 07/13/2021 22:39    Scheduled Meds:  apixaban  5 mg Oral BID   citalopram  20 mg Oral Daily   levothyroxine  75 mcg Oral Daily   loratadine  10 mg Oral Daily   pantoprazole (PROTONIX) IV  40 mg Intravenous Q24H   senna  1 tablet Oral BID   Continuous Infusions:  sodium chloride 100 mL/hr at 07/15/21 0110   [START ON 07/16/2021] ceFEPime (MAXIPIME) IV     meropenem (MERREM) IV 2 g (07/15/21 0838)     LOS:  3 days   Time spent: 38 minutes. More than 50% of the time was spent in counseling/coordination of care  Lorella Nimrod, MD [facility]  If 7PM-7AM, please contact night-coverage [email or web address]  07/15/2021, 2:23 PM   This record has been created using Systems analyst. Errors have been sought and corrected,but may not always be located. Such creation errors do not reflect on the standard of care.

## 2021-07-15 NOTE — TOC Initial Note (Signed)
Transition of Care [facility]) - Initial/Assessment Note    Patient Details  Name: Latoya Freeman MRN: [id number] Date of Birth: 05/02/1941  Transition [facility]) CM/SW Contact:    Alberteen Sam, LCSW Phone Number: 07/15/2021, 3:16 PM  Clinical Narrative:                  CSW spoke with patient's daughter Latoya Freeman who reports she is working on discharge planning options to review with her mother, which includes either patient staying at her home or daughter staying at patient's home until she is better.   Agreeable to home health services, reports was previously set up with [facility] in the past but she's agreeable for CSW to send out referrals.   Referral sent to [place] pending response.     Expected Discharge Plan: [place] Barriers to Discharge: Continued Medical Work up   Patient Goals and CMS Choice Patient states their goals for this hospitalization and ongoing recovery are:: to go home CMS Medicare.gov Compare Post Acute Care list provided to:: Patient Represenative (must comment) Choice offered to / list presented to : Adult Children  Expected Discharge Plan and Services Expected Discharge Plan: [place] Acute Care Choice: [place] arrangements for the past 2 months: Single Family Home                                      Prior Living Arrangements/Services Living arrangements for the past 2 months: Single Family Home Lives with:: Self Patient language and need for interpreter reviewed:: Yes Do you feel safe going back to the place where you live?: Yes      Need for Family Participation in Patient Care: Yes (Comment) Care giver support system in place?: Yes (comment)   Criminal Activity/Legal Involvement Pertinent to Current Situation/Hospitalization: No - Comment as needed  Activities of Daily Living Home Assistive Devices/Equipment: Eyeglasses, Dentures (specify type), Walker (specify type) ADL  Screening (condition at time of admission) Patient's cognitive ability adequate to safely complete daily activities?: Yes Is the patient deaf or have difficulty hearing?: No Does the patient have difficulty seeing, even when wearing glasses/contacts?: No Does the patient have difficulty concentrating, remembering, or making decisions?: Yes Patient able to express need for assistance with ADLs?: Yes Does the patient have difficulty dressing or bathing?: No Independently performs ADLs?: Yes (appropriate for developmental age) Does the patient have difficulty walking or climbing stairs?: No Weakness of Legs: None Weakness of Arms/Hands: None  Permission Sought/Granted Permission sought to share information with : Case Manager, Customer service manager, Family Supports Permission granted to share information with : Yes, Verbal Permission Granted  Share Information with NAME: Latoya Freeman  Permission granted to share info w AGENCY: [place] granted to share info w Relationship: daughter  Permission granted to share info w Contact Information: [phone]  Emotional Assessment Appearance:: Appears stated age       Alcohol / Substance Use: Not Applicable Psych Involvement: No (comment)  Admission diagnosis:  Headache [R51.9] Acute nonintractable headache, unspecified headache type [R51.9] Leukocytosis, unspecified type [D72.829] Patient Active Problem List   Diagnosis Date Noted   Leukocytosis    Headache 07/12/2021   Dehydration 07/12/2021   Lumbar radiculopathy, chronic 07/01/2021   Iron deficiency anemia    Polyp of colon    Columnar-lined esophagus    Abdominal  pain 08/05/2020   Celiac artery stenosis ([place]) 08/05/2020   Mesenteric ischemia ([place]) 07/17/2020   History of pulmonary embolism 04/29/2020   Anemia in chronic kidney disease 04/23/2020   Secondary hyperparathyroidism of renal origin ([place]) 04/23/2020   Urinary tract infection 04/23/2020   Benign  breast lumps 02/23/2020   Brain aneurysm 02/23/2020   Hemorrhoids 02/23/2020   Left wrist tendonitis 02/23/2020   Menopausal symptoms 02/23/2020   Renal mass 02/23/2020   Hypercalcemia 12/18/2019   Hypertensive chronic kidney disease with stage 1 through stage 4 chronic kidney disease, or unspecified chronic kidney disease 12/18/2019   Hypokalemia 12/18/2019   Benign hypertension with CKD (chronic kidney disease) stage III ([place]) 12/18/2019   Cyst of left ovary 04/04/2019   Postmenopausal bleeding 04/04/2019   Endometrial thickening on ultrasound 04/04/2019   Pulmonary embolism (HCC)    GERD (gastroesophageal reflux disease) 08/07/2018   Hypertension 08/07/2018   Shingles 08/07/2018   Thyroid disease 08/07/2018   HCAP (healthcare-associated pneumonia) 07/27/2018   Obesity (BMI 30-39.9) 06/21/2018   PCP:  [name], MD Pharmacy:   [facility] [address] [place] [address] [place] [address] Phone: [phone] Fax: [facility] - [place], [place] [address] Phone: [phone] Fax: [phone]     Social Determinants of Health (SDOH) Interventions    Readmission Risk Interventions No flowsheet data found.

## 2021-07-15 NOTE — Progress Notes (Addendum)
Physical Therapy Treatment Patient Details Name: Latoya Freeman MRN: [id number] DOB: 08-21-1941 Today's Date: 07/15/2021    History of Present Illness 80 y.o. female with medical history significant for hypothyroidism, hypertension, GERD, status post recent L3-L4 discectomy and L4-L5 as well as L5-S1 decompression on 07/01/21 and was discharged home in stable condition.  She presents to the ER for evaluation of a headache which she described as the worst headache of her life.  Pt with signficant AMS during exam.    PT Comments    Limited session due to pt feeling poorly once we got to sitting.  O2 was in the low 90s/high 80s, HR stable in the 70-90 range and when BP did finally read (in supine) it was 140s/80s.  Despite not feeling well she did show good effort with supine exercises, though again limited due to feeling poorly.  L hip pain with SLRs.  Per yesterday's performance home with HHPT remains a possible d/c plan, however she was much more functionally limited today and currently d/c to short term rehab is safer and more appropriate..   Follow Up Recommendations  SNF; 24/7 supervision     Equipment Recommendations  None recommended by PT    Recommendations for Other Services       Precautions / Restrictions Precautions Precautions: Fall Restrictions Weight Bearing Restrictions: No    Mobility  Bed Mobility Overal bed mobility: Needs Assistance Bed Mobility: Supine to Sit   Sidelying to sit: Mod assist Supine to sit: Mod assist     General bed mobility comments: Pt feeling poorly once assisted to sitting.  c/o dizziness, after a few minutes she was still feeling poorly and layed herself back down needing assist ot get LEs back into bed.    Transfers                 General transfer comment: deferred secondary to sustained feeling poorly sitting at EOB  Ambulation/Gait                 Stairs             Wheelchair Mobility    Modified  Rankin (Stroke Patients Only)       Balance Overall balance assessment: Needs assistance Sitting-balance support: Single extremity supported Sitting balance-Leahy Scale: Fair Sitting balance - Comments: pt could only tolerate ~2 minutes of sitting before leaning back over on the bed, poor tolerance and inability to maintain upright w/o consistent light assist                                    Cognition Arousal/Alertness: Awake/alert Behavior During Therapy: WFL for tasks assessed/performed Overall Cognitive Status: Difficult to assess                                        Exercises General Exercises - Lower Extremity Ankle Circles/Pumps: AROM;10 reps;5 reps Heel Slides: Strengthening;5 reps (with resisted leg ext) Hip ABduction/ADduction: Strengthening;AROM;5 reps Straight Leg Raises: AROM;5 reps;AAROM (pain in L hip, AAROM)    General Comments General comments (skin integrity, edema, etc.): c/o dizziness in sitting, deferred ambulation.  O2 generally in the 90s though occasionally in the high 80s, HR appropriate, unable to get BP quickly however in getting back to supine a few minutes later it was 140s/80s while still feeling poorly.  Nusing notified.      Pertinent Vitals/Pain Pain Assessment: No/denies pain    Home Living                      Prior Function            PT Goals (current goals can now be found in the care plan section) Progress towards PT goals: Not progressing toward goals - comment (dizziness in sitting, poor tolerance)    Frequency    Min 2X/week      PT Plan Current plan remains appropriate    Co-evaluation              AM-PAC PT "6 Clicks" Mobility   Outcome Measure  Help needed turning from your back to your side while in a flat bed without using bedrails?: A Little Help needed moving from lying on your back to sitting on the side of a flat bed without using bedrails?: A Lot Help needed  moving to and from a bed to a chair (including a wheelchair)?: A Lot Help needed standing up from a chair using your arms (e.g., wheelchair or bedside chair)?: A Lot Help needed to walk in hospital room?: A Lot Help needed climbing 3-5 steps with a railing? : A Lot 6 Click Score: 13    End of Session   Activity Tolerance: Other (comment) (dizziness and feeling poorly with getting to sitting) Patient left: with bed alarm set;with call bell/phone within reach Nurse Communication: Mobility status (symptoms and vitalswith sitting) PT Visit Diagnosis: Unsteadiness on feet (R26.81);Muscle weakness (generalized) (M62.81)     Time: 5501-5868 PT Time Calculation (min) (ACUTE ONLY): 16 min  Charges:  $Therapeutic Exercise: 8-22 mins                     Kreg Shropshire, DPT 07/15/2021, 6:04 PM

## 2021-07-15 NOTE — Progress Notes (Signed)
ID Pt is awake and alert, slow  Says she has no headache Ate lunch No nausea or vomiting  O/e awake and alert Oriented X5 Patient Vitals for the past 24 hrs:  BP Temp Temp src Pulse Resp SpO2  07/15/21 1953 (!) 152/68 98.1 F (36.7 C) Oral 62 16 94 %  07/15/21 1503 (!) 163/66 98.6 F (37 C) Oral 63 16 93 %  07/15/21 0751 (!) 161/71 100 F (37.8 C) Oral 69 16 92 %  07/15/21 0428 (!) 165/71 98.7 F (37.1 C) Oral 72 18 99 %    No neck rigidity B/l air entry [id number] Abd soft CNS non focal Lumbar surgical site- healed well  Labs CBC Latest Ref Rng & Units 07/15/2021 07/14/2021 07/13/2021  WBC 4.0 - 10.5 K/uL 14.0(H) 19.5(H) 22.0(H)  Hemoglobin 12.0 - 15.0 g/dL 11.0(L) 10.5(L) 12.5  Hematocrit 36.0 - 46.0 % 31.7(L) 30.5(L) 34.9(L)  Platelets 150 - 400 K/uL 223 235 235    CMP Latest Ref Rng & Units 07/15/2021 07/14/2021 07/13/2021  Glucose 70 - 99 mg/dL 88 102(H) 133(H)  BUN 8 - 23 mg/dL 16 17 19   Creatinine 0.44 - 1.00 mg/dL 1.02(H) 1.11(H) 1.20(H)  Sodium 135 - 145 mmol/L 140 140 142  Potassium 3.5 - 5.1 mmol/L 3.6 3.8 4.3  Chloride 98 - 111 mmol/L 107 112(H) 113(H)  CO2 22 - 32 mmol/L 24 21(L) 24  Calcium 8.9 - 10.3 mg/dL 9.7 10.0 10.1  Total Protein 6.5 - 8.1 g/dL - - -  Total Bilirubin 0.3 - 1.2 mg/dL - - -  Alkaline Phos 38 - 126 U/L - - -  AST 15 - 41 U/L - - -  ALT 0 - 44 U/L - - -  Micro 07/12/2021 blood culture no growth 07/12/2021 CSF culture Enterobacter cloacae Susceptible to cefepime, ceftazidime, Cipro imipenem PIP Tazocin and Bactrim.  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 Patient is improving on meropenem Can de-escalate to cefepime.  Will need for a total of 3 weeks.  End date would be 08/02/2021.  She does not have any hardware the CNS.  Hence repeat CSF may not be needed.  Intermittent confusion especially at nighttime.  Has improved.  Degenerative disc  disease.  Status for lumbar discectomy  History of cerebral bleed due to aneurysm and has had clipping in the past.  CTA done this admission did not show any bleed  AKI on CKD improved  Hypothyroidism on Synthroid  Discussed the management with the patient and the care team.

## 2021-07-15 NOTE — Progress Notes (Signed)
    Attending Progress Note  History: Latoya Freeman is here for fevers and concern for meningitis. CSF positive for gram negative rods.  Interval History: Latoya Freeman continues to be neurologically stable.   Physical Exam: Vitals:   07/14/21 1931 07/15/21 0428  BP: (!) 152/86 (!) 165/71  Pulse: 79 72  Resp: 18 18  Temp: 98.5 F (36.9 C) 98.7 F (37.1 C)  SpO2: 93% 99%    AA oriented to self and location but disoriented to time CNI  Strength:good and equal strength throughout/ Incision c/d/I with running nylon in place. No signs of infection.   Data:  Recent Labs  Lab 07/13/21 0624 07/14/21 0628 07/15/21 0408  NA 142 140 140  K 4.3 3.8 3.6  CL 113* 112* 107  CO2 24 21* 24  BUN 19 17 16   CREATININE 1.20* 1.11* 1.02*  GLUCOSE 133* 102* 88  CALCIUM 10.1 10.0 9.7   Recent Labs  Lab 07/12/21 0533  AST 20  ALT 12  ALKPHOS 53     Recent Labs  Lab 07/13/21 0624 07/14/21 0628 07/15/21 0408  WBC 22.0* 19.5* 14.0*  HGB 12.5 10.5* 11.0*  HCT 34.9* 30.5* 31.7*  PLT 235 235 223   Recent Labs  Lab 07/12/21 1044  INR 1.0         Other tests/results:  - (07/13/21 CTA head)  IMPRESSION: 1. Postoperative changes from previous left pterional craniotomy for surgical clipping of a left ICA aneurysm. No visible residual aneurysm evident by CTA. 2. Mild for age intracranial atherosclerotic disease with no large vessel occlusion, hemodynamically significant stenosis, or other acute vascular abnormality. 3. Age-related cerebral atrophy with moderate chronic microvascular ischemic disease. No other acute intracranial abnormality.  Electronically Signed   By: Jeannine Boga M.D.   On: 07/13/2021 22:39  - (07/12/21 CT L-spine) IMPRESSION: Postoperative changes at L3-L4, L4-L5, and L5-S1. Nonspecific density in the laminectomy beds may reflect fluid and granulation tissue. Infection is not well evaluated. The canal is not well evaluated at the operative  levels but stenosis does appear decreased. Electronically Signed   By: Macy Mis M.D.   On: 07/12/2021 18:12    Assessment/Plan:  Latoya Freeman is a 80 y.o presenting with headache in the setting of recent L4-S1 laminectomies. LP positive for gram negative rods.  - continue antibiotics per ID recs - pain control - ok for DVT prophylaxis - Will remove sutures prior to discharge.  - no plan for acute neurosurgical intervention at this time.  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

## 2021-07-16 ENCOUNTER — Inpatient Hospital Stay: Payer: Self-pay

## 2021-07-16 DIAGNOSIS — G009 Bacterial meningitis, unspecified: Secondary | ICD-10-CM | POA: Diagnosis not present

## 2021-07-16 MED ORDER — TRIAMTERENE-HCTZ 37.5-25 MG PO TABS
1.0000 | ORAL_TABLET | Freq: Every day | ORAL | Status: DC
  Administered 2021-07-16 – 2021-07-21 (×6): 1 via ORAL
  Filled 2021-07-16 (×6): qty 1

## 2021-07-16 MED ORDER — SODIUM CHLORIDE 0.9% FLUSH
10.0000 mL | INTRAVENOUS | Status: DC | PRN
  Administered 2021-07-21: 10 mL

## 2021-07-16 MED ORDER — SODIUM CHLORIDE 0.9% FLUSH
10.0000 mL | Freq: Two times a day (BID) | INTRAVENOUS | Status: DC
  Administered 2021-07-16 – 2021-07-20 (×8): 10 mL
  Administered 2021-07-20: 20 mL
  Administered 2021-07-21: 10 mL

## 2021-07-16 MED ORDER — CHLORHEXIDINE GLUCONATE CLOTH 2 % EX PADS
6.0000 | MEDICATED_PAD | Freq: Every day | CUTANEOUS | Status: DC
  Administered 2021-07-17 – 2021-07-21 (×5): 6 via TOPICAL

## 2021-07-16 NOTE — Treatment Plan (Signed)
Diagnosis: Enterobacter cloacae meningitis Baseline Creatinine 1.10 Crcl 56   Allergies  Allergen Reactions   Bee Venom Swelling   Shellfish Allergy Swelling   Tamoxifen Hives   Penicillins Hives, Rash and Other (See Comments)    Has patient had a PCN reaction causing immediate rash, facial/tongue/throat swelling, SOB or lightheadedness with hypotension: Unknown Has patient had a PCN reaction causing severe rash involving mucus membranes or skin necrosis: Unknown Has patient had a PCN reaction that required hospitalization: Unknown Has patient had a PCN reaction occurring within the last 10 years: No If all of the above answers are "NO", then may proceed with Cephalosporin use.     OPAT Orders Discharge antibiotics: Cefepime 2 gram IV Q 12 ( may need q8 if crcl > 60) End date 08/02/21  ( Pharmacy consult at [facility] for adjusting dose of cefepime to Q8 if crcl> 60)  PIC Care Per Protocol:including placement of biopatch  Labs weekly  Monday while on IV antibiotics: _X_ CBC with differential __X_ CMP  Labs every Thursday while on antibiotics BMP  _X_ Please pull PIC at completion of IV antibiotics _ Fax weekly labs promptly to [facility]  to [phone]  Clinic Follow Up Appt with Dr.Roshanna Cimino: 07/30/21 at 12.15- video visit   Call [phone] with any questions

## 2021-07-16 NOTE — Progress Notes (Signed)
Occupational Therapy Treatment Patient Details Name: Latoya Freeman MRN: [id number] DOB: Mar 02, 1941 Today's Date: 07/16/2021    History of present illness 80 y.o. female with medical history significant for hypothyroidism, hypertension, GERD, status post recent L3-L4 discectomy and L4-L5 as well as L5-S1 decompression on 07/01/21 and was discharged home in stable condition.  She presents to the ER for evaluation of a headache which she described as the worst headache of her life.  Pt with signficant AMS during exam.   OT comments  Pt seen for OT tx this date to f/u re: safety with ADLs/ADL mobility. OT engages pt in sup to sit with MIN/MOD A and pt demos F static sitting balance. Pt with mild c/o dizziness in sitting. OT engages pt in ankle pumps and kicks in sitting to improve circulation in prep for standing activities. Pt requires SETUP to participate in seated UB grooming tasks. Pt requires MIN/MOD A to CTS with RW and SPS from bed to [facility]. Pt requires MOD A for peri care and is then engaged in bathing tasks with MOD A using STS method for perineal are. Pt able to take ~7-8 steps from [facility] to recliner with RW with cues for safety and constant CGA/MIN A for balance. Pt in chair with all needs met and in reach, chair alarm activated. Will continue to follow acutely. Updated d/c recommendation to SNF to reflect current level of assist needed and decreased strength relative to pt's baseline and living home alone.    Follow Up Recommendations  SNF    Equipment Recommendations  Other (comment) (defer to next level of care)    Recommendations for Other Services      Precautions / Restrictions Precautions Precautions: Fall Restrictions Weight Bearing Restrictions: No       Mobility Bed Mobility Overal bed mobility: Needs Assistance Bed Mobility: Supine to Sit     Supine to sit: Min assist;Mod assist;HOB elevated     General bed mobility comments: increased time, effortful, use of bed  rails.    Transfers Overall transfer level: Needs assistance Equipment used: Rolling walker (2 wheeled) Transfers: Sit to/from Stand Sit to Stand: Min assist;Mod assist         General transfer comment: with RW from EOB with cues for hand placement, but pt with poor carryover. Steadying assist upon coming to stand.    Balance Overall balance assessment: Needs assistance Sitting-balance support: Single extremity supported;Feet supported Sitting balance-Leahy Scale: Fair Sitting balance - Comments: tolerates EOB static sitting x10 mins with at least unilateral UE support, alternating hands to participate in seated ADLs. Some c/o dizziness upon initially coming to sitting   Standing balance support: Single extremity supported;During functional activity Standing balance-Leahy Scale: Poor Standing balance comment: CGA/MIN A to sustain static standing, but limited tolerance d/t c/o dizziness. Pt requires UE support. Requires more assist wtih fxl mobility this date-at least MIN A.                           ADL either performed or assessed with clinical judgement   ADL Overall ADL's : Needs assistance/impaired     Grooming: Wash/dry hands;Wash/dry face;Set up;Sitting   Upper Body Bathing: Minimal assistance;Sitting   Lower Body Bathing: Moderate assistance;Sit to/from stand   Upper Body Dressing : Set up;Sitting   Lower Body Dressing: Moderate assistance;Sitting/lateral leans Lower Body Dressing Details (indicate cue type and reason): don socks Toilet Transfer: Minimal assistance;Moderate assistance;Stand-pivot;BSC Toilet Transfer Details (indicate cue type  and reason): some c/o dizziness, requires constant hands on for safety. Toileting- Clothing Manipulation and Hygiene: Moderate assistance;Sit to/from stand Toileting - Clothing Manipulation Details (indicate cue type and reason): able to complete anterior peri care sufficiently in sitting and requires MOD A for  standing posterior peri care.     Functional mobility during ADLs: Minimal assistance;Rolling walker (to take ~7-8 turning steps from [facility] to recliner)       Vision Patient Visual Report: No change from baseline     Perception     Praxis      Cognition Arousal/Alertness: Awake/alert Behavior During Therapy: WFL for tasks assessed/performed Overall Cognitive Status: Difficult to assess                                 General Comments: Pt oriented to self, place, and some aspects of time. States it's the 25th which is correct date, but states "of November" when prompted that this is incorrect, states "no, October". She is able to follow all simple commands, but difficulty with multi-step commands.        Exercises Other Exercises Other Exercises: OT engaged pt in toileting, bathing, dressing tasks.   Shoulder Instructions       General Comments      Pertinent Vitals/ Pain       Pain Assessment: Faces Faces Pain Scale: Hurts little more Pain Location: headache Pain Descriptors / Indicators: Headache Pain Intervention(s): Limited activity within patient's tolerance;Monitored during session  Home Living                                          Prior Functioning/Environment              Frequency  Min 1X/week        Progress Toward Goals  OT Goals(current goals can now be found in the care plan section)  Progress towards OT goals: Progressing toward goals  Acute Rehab OT Goals Patient Stated Goal: to go home OT Goal Formulation: With patient Time For Goal Achievement: 07/28/21 Potential to Achieve Goals: Good  Plan Discharge plan remains appropriate    Co-evaluation                 AM-PAC OT "6 Clicks" Daily Activity     Outcome Measure   Help from another person eating meals?: None Help from another person taking care of personal grooming?: A Little Help from another person toileting, which includes using  toliet, bedpan, or urinal?: A Little Help from another person bathing (including washing, rinsing, drying)?: A Little Help from another person to put on and taking off regular upper body clothing?: A Little Help from another person to put on and taking off regular lower body clothing?: A Lot 6 Click Score: 18    End of Session Equipment Utilized During Treatment: Rolling walker;Gait belt  OT Visit Diagnosis: Other abnormalities of gait and mobility (R26.89);Other symptoms and signs involving cognitive function   Activity Tolerance Patient tolerated treatment well   Patient Left in chair;with call bell/phone within reach;with chair alarm set   Nurse Communication Mobility status        Time: 2094-7096 OT Time Calculation (min): 38 min  Charges: OT General Charges $OT Visit: 1 Visit OT Treatments $Self Care/Home Management : 23-37 mins $Therapeutic Activity: 8-22 mins  Gerrianne Scale,  MS, OTR/L ascom [phone] 07/16/21, 2:21 PM

## 2021-07-16 NOTE — Progress Notes (Signed)
ID Daughter at bed side Pt doing much better Says headache has improved a lot No fever Appetite good  O/e awake and alert Oriented x4 Patient Vitals for the past 24 hrs:  BP Temp Temp src Pulse Resp SpO2  07/16/21 1927 (!) 141/84 98.6 F (37 C) Oral 62 18 96 %  07/16/21 0725 (!) 168/71 98.2 F (36.8 C) Oral (!) 58 -- 93 %  07/16/21 0618 (!) 162/69 98 F (36.7 C) Oral (!) 59 -- 94 %    No neck rigidity B/l air entry Hss1s2 Abd soft CNS non focal Lumbar surgical site- healed well  Labs CBC Latest Ref Rng & Units 07/15/2021 07/14/2021 07/13/2021  WBC 4.0 - 10.5 K/uL 14.0(H) 19.5(H) 22.0(H)  Hemoglobin 12.0 - 15.0 g/dL 11.0(L) 10.5(L) 12.5  Hematocrit 36.0 - 46.0 % 31.7(L) 30.5(L) 34.9(L)  Platelets 150 - 400 K/uL 223 235 235    CMP Latest Ref Rng & Units 07/15/2021 07/14/2021 07/13/2021  Glucose 70 - 99 mg/dL 88 102(H) 133(H)  BUN 8 - 23 mg/dL 16 17 19   Creatinine 0.44 - 1.00 mg/dL 1.02(H) 1.11(H) 1.20(H)  Sodium 135 - 145 mmol/L 140 140 142  Potassium 3.5 - 5.1 mmol/L 3.6 3.8 4.3  Chloride 98 - 111 mmol/L 107 112(H) 113(H)  CO2 22 - 32 mmol/L 24 21(L) 24  Calcium 8.9 - 10.3 mg/dL 9.7 10.0 10.1  Total Protein 6.5 - 8.1 g/dL - - -  Total Bilirubin 0.3 - 1.2 mg/dL - - -  Alkaline Phos 38 - 126 U/L - - -  AST 15 - 41 U/L - - -  ALT 0 - 44 U/L - - -  Micro 07/12/2021 blood culture no growth 07/12/2021 CSF culture Enterobacter cloacae Susceptible to cefepime, ceftazidime, Cipro imipenem PIP Tazocin and Bactrim.  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 Was on meropenem since admisison which was switched to cefepime this morning  Will need for a total of 3 weeks.  End date would be 08/02/2021.  She does not have any hardware the CNS.  Hence repeat CSF may not be needed. She is currently on cefepime 2 grams IV q 12 for crcl of 56. Increase to Q8 if cr > 60  Confusion/headache resolved   Degenerative disc disease.  Status for lumbar discectomy  History of cerebral bleed due to aneurysm and has had clipping in the past.  CTA done this admission did not show any bleed  AKI on CKD improved  Hypothyroidism on Synthroid  Discussed the management with the patient, her daughter  and the care team.

## 2021-07-16 NOTE — NC FL2 (Signed)
[place] LEVEL OF CARE SCREENING TOOL     IDENTIFICATION  Patient Name: Latoya Freeman Birthdate: 03-04-1941 Sex: female Admission Date (Current Location): 07/12/2021  [place] and [place] Number:  Engineering geologist and Address:  [facility], [address] [place], [place] [address]      Provider Number: [id number]  Attending Physician Name and Address:  Lorella Nimrod, MD  Relative Name and Phone Number:       Current Level of Care: Hospital Recommended Level of Care: [place] Prior Approval Number:    Date Approved/Denied:   PASRR Number: [id number]  Discharge Plan: SNF    Current Diagnoses: Patient Active Problem List   Diagnosis Date Noted   Leukocytosis    Headache 07/12/2021   Dehydration 07/12/2021   Lumbar radiculopathy, chronic 07/01/2021   Iron deficiency anemia    Polyp of colon    Columnar-lined esophagus    Abdominal pain 08/05/2020   Celiac artery stenosis ([place]) 08/05/2020   Mesenteric ischemia ([place]) 07/17/2020   History of pulmonary embolism 04/29/2020   Anemia in chronic kidney disease 04/23/2020   Secondary hyperparathyroidism of renal origin ([place]) 04/23/2020   Urinary tract infection 04/23/2020   Benign breast lumps 02/23/2020   Brain aneurysm 02/23/2020   Hemorrhoids 02/23/2020   Left wrist tendonitis 02/23/2020   Menopausal symptoms 02/23/2020   Renal mass 02/23/2020   Hypercalcemia 12/18/2019   Hypertensive chronic kidney disease with stage 1 through stage 4 chronic kidney disease, or unspecified chronic kidney disease 12/18/2019   Hypokalemia 12/18/2019   Benign hypertension with CKD (chronic kidney disease) stage III ([place]) 12/18/2019   Cyst of left ovary 04/04/2019   Postmenopausal bleeding 04/04/2019   Endometrial thickening on ultrasound 04/04/2019   Pulmonary embolism (HCC)    GERD (gastroesophageal reflux disease) 08/07/2018   Hypertension 08/07/2018   Shingles  08/07/2018   Thyroid disease 08/07/2018   HCAP (healthcare-associated pneumonia) 07/27/2018   Obesity (BMI 30-39.9) 06/21/2018    Orientation RESPIRATION BLADDER Height & Weight     Self  Normal Incontinent, External catheter Weight: 195 lb 1.7 oz (88.5 kg) Height:  5\' 1"  (154.9 cm)  BEHAVIORAL SYMPTOMS/MOOD NEUROLOGICAL BOWEL NUTRITION STATUS   (None)  (None) Continent Diet (2 gram sodium)  AMBULATORY STATUS COMMUNICATION OF NEEDS Skin   Extensive Assist Verbally Surgical wounds                       Personal Care Assistance Level of Assistance  Bathing, Feeding, Dressing Bathing Assistance: Limited assistance Feeding assistance: Limited assistance Dressing Assistance: Limited assistance     Functional Limitations Info  Sight, Hearing, Speech Sight Info: Adequate Hearing Info: Adequate Speech Info: Adequate    SPECIAL CARE FACTORS FREQUENCY  PT (By licensed PT), OT (By licensed OT)     PT Frequency: 5 x week OT Frequency: 5 x week            Contractures Contractures Info: Not present    Additional Factors Info  Code Status, Allergies Code Status Info: DNR Allergies Info: Bee venom, Shellfish, Tamoxifen, Penicillins           Current Medications (07/16/2021):  This is the current hospital active medication list Current Facility-Administered Medications  Medication Dose Route Frequency Provider Last Rate Last Admin   acetaminophen (TYLENOL) tablet 650 mg  650 mg Oral Q6H PRN Agbata, Tochukwu, MD   650 mg at 07/16/21 1433   Or   acetaminophen (TYLENOL) suppository  650 mg  650 mg Rectal Q6H PRN Agbata, Tochukwu, MD       amLODipine (NORVASC) tablet 7.5 mg  7.5 mg Oral Daily Lorella Nimrod, MD   7.5 mg at 07/16/21 9449   apixaban (ELIQUIS) tablet 5 mg  5 mg Oral BID Lorella Nimrod, MD   5 mg at 07/16/21 6759   bisacodyl (DULCOLAX) EC tablet 5 mg  5 mg Oral Daily PRN Agbata, Tochukwu, MD       calcium carbonate (TUMS - dosed in mg elemental calcium) chewable  tablet 400 mg of elemental calcium  400 mg of elemental calcium Oral TID Lorella Nimrod, MD   400 mg of elemental calcium at 07/16/21 0925   ceFEPIme (MAXIPIME) 2 g in sodium chloride 0.9 % 100 mL IVPB  2 g Intravenous Q12H Oswald Hillock, RPH   Stopped at 07/16/21 1638   citalopram (CELEXA) tablet 20 mg  20 mg Oral Daily Agbata, Tochukwu, MD   20 mg at 07/16/21 4665   HYDROcodone-acetaminophen (NORCO/VICODIN) 5-325 MG per tablet 1 tablet  1 tablet Oral Q6H PRN Agbata, Tochukwu, MD   1 tablet at 07/15/21 0835   ketorolac (TORADOL) 15 MG/ML injection 15 mg  15 mg Intravenous Q8H PRN Lorella Nimrod, MD       levothyroxine (SYNTHROID) tablet 75 mcg  75 mcg Oral Daily Agbata, Tochukwu, MD   75 mcg at 07/16/21 0624   loratadine (CLARITIN) tablet 10 mg  10 mg Oral Daily Agbata, Tochukwu, MD   10 mg at 07/16/21 0926   losartan (COZAAR) tablet 50 mg  50 mg Oral Daily Lorella Nimrod, MD   50 mg at 07/16/21 0926   ondansetron (ZOFRAN) tablet 4 mg  4 mg Oral Q6H PRN Agbata, Tochukwu, MD       Or   ondansetron (ZOFRAN) injection 4 mg  4 mg Intravenous Q6H PRN Agbata, Tochukwu, MD   4 mg at 07/14/21 1403   pantoprazole (PROTONIX) injection 40 mg  40 mg Intravenous Q24H Agbata, Tochukwu, MD   40 mg at 07/16/21 1426   senna (SENOKOT) tablet 8.6 mg  1 tablet Oral BID Agbata, Tochukwu, MD   8.6 mg at 07/16/21 9935   triamterene-hydrochlorothiazide (MAXZIDE-25) 37.5-25 MG per tablet 1 tablet  1 tablet Oral Daily Lorella Nimrod, MD         Discharge Medications: Please see discharge summary for a list of discharge medications.  Relevant Imaging Results:  Relevant Lab Results:   Additional Information SS#: [id number]. Will need IV Cefepime for 3 weeks. Stop date 9/11. COVID vaccines: Moderna 12/25/19, 01/24/20. Unsure about booster.  Candie Chroman, LCSW

## 2021-07-16 NOTE — Progress Notes (Signed)
PROGRESS NOTE    Latoya Freeman  ZOX:[id number] DOB: Oct 23, 1941 DOA: 07/12/2021 PCP: [name], MD   Brief Narrative: Taken from prior notes. 51 y f with k/h/o hypothyroidism, HTN, GERD, s/p recent L3-L4 & L4-L5 discectomy and L5-S1 decompression on 07/01/21 admitted for worst headache of life. CSF c/s bacterial meningitis, growing Enterobacter cloacae, nosocomial infection per ID.  Infectious disease de-escalated antibiotics to cefepime according to susceptibility results.  Initially received meropenem and vancomycin. Will need 3 weeks of antibiotics, stop date is 08/02/2021.  Neurosurgery was consulted due to her history of recent L3-S1 decompression.  Per neurosurgery no sign of surgical infection.  Sutures still in place which needs to be removed before discharge by neurosurgery.  OT is now recommending SNF.  Daughter might not be able to take care of her at home.  Subjective: Patient was more alert and answering very appropriately.  Delayed response has been improved and she appears to be at her baseline.  She was concerned that her daughter is very busy and she cannot put extra burden on her.  Interested to go to a rehab.  Assessment & Plan:   Principal Problem:   Headache Active Problems:   GERD (gastroesophageal reflux disease)   Thyroid disease   History of pulmonary embolism   Hypokalemia   Obesity (BMI 30-39.9)   Benign hypertension with CKD (chronic kidney disease) stage III (HCC)   Dehydration   Leukocytosis  Suspected bacterial meningitis.  Some improvement in leukocytosis.  CSF cultures with Enterobacter closure, and nosocomial infection.  Blood cultures negative. History of recent spinal surgery, L3-S1 decompression with dural breach. Infectious diseases on board.  Initially received meropenem and vancomycin which were switched with cefepime and according to susceptibility results. -Continue cefepime-till 08/02/2021. -We will order PICC line.  History of PE.   Eliquis was initially held due to elevated RBC on CSF.  Neurosurgery wants to have a CTA before resuming it which shows no acute abnormality, prior ICA aneurysm repaired. -Eliquis was resumed after having a CTA without any acute abnormality or bleeding.  Hypothyroidism. -Continue Synthroid  AKI with underlying CKD stage IIIa.  Creatinine improving and appears to be around his baseline now.  Patient received IV fluid. -Encourage p.o. hydration  Concern of delirium/mild underlying cognitive impairment.  Per daughter patient did not had any cognitive impairment before recent hospitalizations.  Do endorse having concern of delirium and hallucinations during recent hospitalization for surgery. -Delirium precautions -Continue to monitor  Hypertension.  On multiple antihypertensives at home which were initially held due to softer blood pressure.  Blood pressure trending up. -Restart home amlodipine and losartan yesterday. -Will restart home dose of triamterene and HCTZ today.  Generalized weakness. -PT/OT is recommending home health services, but daughter does not think that she will be able to provide the care she needs at this time.  Patient lives alone at baseline. -Repeat OT evaluation is suggesting SNF placement. -Repeat PT evaluation pending  Class II obesity. Estimated body mass index is 36.87 kg/m as calculated from the following:   Height as of this encounter: 5\' 1"  (1.549 m).   Weight as of this encounter: 88.5 kg.   Objective: Vitals:   07/15/21 1503 07/15/21 1953 07/16/21 0618 07/16/21 0725  BP: (!) 163/66 (!) 152/68 (!) 162/69 (!) 168/71  Pulse: 63 62 (!) 59 (!) 58  Resp: 16 16    Temp: 98.6 F (37 C) 98.1 F (36.7 C) 98 F (36.7 C) 98.2 F (36.8 C)  TempSrc: Oral  Oral Oral Oral  SpO2: 93% 94% 94% 93%  Weight:      Height:        Intake/Output Summary (Last 24 hours) at 07/16/2021 1529 Last data filed at 07/16/2021 1422 Gross per 24 hour  Intake 340.39 ml  Output  2150 ml  Net -1809.61 ml    Filed Weights   07/12/21 0529  Weight: 88.5 kg    Examination:  General.  Well-developed elderly lady, in no acute distress. Pulmonary.  Lungs clear bilaterally, normal respiratory effort. CV.  Regular rate and rhythm, no JVD, rub or murmur. Abdomen.  Soft, nontender, nondistended, BS positive. CNS.  Alert and oriented x3.  No focal neurologic deficit. Extremities.  No edema, no cyanosis, pulses intact and symmetrical. Psychiatry.  Judgment and insight appears normal.   DVT prophylaxis: SCDs Code Status: Full Family Communication: Daughter was updated on phone. Disposition Plan:  Status is: Inpatient  Remains inpatient appropriate because:Inpatient level of care appropriate due to severity of illness  Dispo: The patient is from: Home              Anticipated d/c is to: To be determined.              Patient currently is not medically stable to d/c.   Difficult to place patient No               Level of care: Med-Surg  All the records are reviewed and case discussed with Care Management/Social Worker. Management plans discussed with the patient, nursing and they are in agreement.  Consultants:  Infectious disease  Procedures:  Lumbar puncture  Antimicrobials:  Cefepime  Data Reviewed: I have personally reviewed following labs and imaging studies  CBC: Recent Labs  Lab 07/12/21 0533 07/13/21 0624 07/14/21 0628 07/15/21 0408  WBC 20.7* 22.0* 19.5* 14.0*  NEUTROABS 16.4*  --   --   --   HGB 13.7 12.5 10.5* 11.0*  HCT 38.5 34.9* 30.5* 31.7*  MCV 88.5 91.8 91.6 94.6  PLT 329 235 235 081    Basic Metabolic Panel: Recent Labs  Lab 07/12/21 0533 07/12/21 1044 07/13/21 0624 07/14/21 0628 07/15/21 0408  NA 138  --  142 140 140  K 2.8*  --  4.3 3.8 3.6  CL 105  --  113* 112* 107  CO2 22  --  24 21* 24  GLUCOSE 133*  --  133* 102* 88  BUN 25*  --  19 17 16   CREATININE 1.63*  --  1.20* 1.11* 1.02*  CALCIUM 10.2  --  10.1 10.0  9.7  MG  --  1.9  --   --   --     GFR: Estimated Creatinine Clearance: 45.3 mL/min (A) (by C-G formula based on SCr of 1.02 mg/dL (H)). Liver Function Tests: Recent Labs  Lab 07/12/21 0533  AST 20  ALT 12  ALKPHOS 53  BILITOT 0.9  PROT 7.2  ALBUMIN 4.0    No results for input(s): LIPASE, AMYLASE in the last 168 hours. No results for input(s): AMMONIA in the last 168 hours. Coagulation Profile: Recent Labs  Lab 07/12/21 1044  INR 1.0    Cardiac Enzymes: No results for input(s): CKTOTAL, CKMB, CKMBINDEX, TROPONINI in the last 168 hours. BNP (last 3 results) No results for input(s): PROBNP in the last 8760 hours. HbA1C: No results for input(s): HGBA1C in the last 72 hours. CBG: Recent Labs  Lab 07/12/21 0546  GLUCAP 132*    Lipid Profile: No  results for input(s): CHOL, HDL, LDLCALC, TRIG, CHOLHDL, LDLDIRECT in the last 72 hours. Thyroid Function Tests: No results for input(s): TSH, T4TOTAL, FREET4, T3FREE, THYROIDAB in the last 72 hours. Anemia Panel: No results for input(s): VITAMINB12, FOLATE, FERRITIN, TIBC, IRON, RETICCTPCT in the last 72 hours. Sepsis Labs: No results for input(s): PROCALCITON, LATICACIDVEN in the last 168 hours.  Recent Results (from the past 240 hour(s))  Resp Panel by RT-PCR (Flu A&B, Covid) Nasopharyngeal Swab     Status: None   Collection Time: 07/12/21 10:44 AM   Specimen: Nasopharyngeal Swab; Nasopharyngeal(NP) swabs in vial transport medium  Result Value Ref Range Status   SARS Coronavirus 2 by RT PCR NEGATIVE NEGATIVE Final    Comment: (NOTE) SARS-CoV-2 target nucleic acids are NOT DETECTED.  The SARS-CoV-2 RNA is generally detectable in upper respiratory specimens during the acute phase of infection. The lowest concentration of SARS-CoV-2 viral copies this assay can detect is 138 copies/mL. A negative result does not preclude SARS-Cov-2 infection and should not be used as the sole basis for treatment or other patient  management decisions. A negative result may occur with  improper specimen collection/handling, submission of specimen other than nasopharyngeal swab, presence of viral mutation(s) within the areas targeted by this assay, and inadequate number of viral copies(<138 copies/mL). A negative result must be combined with clinical observations, patient history, and epidemiological information. The expected result is Negative.  Fact Sheet for Patients:  EntrepreneurPulse.com.au  Fact Sheet for Healthcare Providers:  IncredibleEmployment.be  This test is no t yet approved or cleared by the [place] FDA and  has been authorized for detection and/or diagnosis of SARS-CoV-2 by FDA under an Emergency Use Authorization (EUA). This EUA will remain  in effect (meaning this test can be used) for the duration of the COVID-19 declaration under Section 564(b)(1) of the Act, 21 U.S.C.section 360bbb-3(b)(1), unless the authorization is terminated  or revoked sooner.       Influenza A by PCR NEGATIVE NEGATIVE Final   Influenza B by PCR NEGATIVE NEGATIVE Final    Comment: (NOTE) The Xpert Xpress SARS-CoV-2/FLU/RSV plus assay is intended as an aid in the diagnosis of influenza from Nasopharyngeal swab specimens and should not be used as a sole basis for treatment. Nasal washings and aspirates are unacceptable for Xpert Xpress SARS-CoV-2/FLU/RSV testing.  Fact Sheet for Patients: EntrepreneurPulse.com.au  Fact Sheet for Healthcare Providers: IncredibleEmployment.be  This test is not yet approved or cleared by the [place] FDA and has been authorized for detection and/or diagnosis of SARS-CoV-2 by FDA under an Emergency Use Authorization (EUA). This EUA will remain in effect (meaning this test can be used) for the duration of the COVID-19 declaration under Section 564(b)(1) of the Act, 21 U.S.C. section 360bbb-3(b)(1),  unless the authorization is terminated or revoked.  Performed at [facility], [address] [place], [place] [address]   Blood culture (routine x 2)     Status: None (Preliminary result)   Collection Time: 07/12/21  1:15 PM   Specimen: BLOOD  Result Value Ref Range Status   Specimen Description BLOOD RIGHT HAND  Final   Special Requests   Final    BOTTLES DRAWN AEROBIC AND ANAEROBIC Blood Culture adequate volume   Culture   Final    NO GROWTH 4 DAYS Performed at [facility], [address] [place], [place] [address]    Report Status PENDING  Incomplete  CSF culture w Gram Stain     Status: Abnormal  Collection Time: 07/12/21  1:20 PM   Specimen: PATH Cytology CSF; Cerebrospinal Fluid  Result Value Ref Range Status   Specimen Description   Final    CSF TUBE 4 Performed at [facility], [address] [place], [place] [address]    Special Requests   Final    NONE Performed at [facility], [address] [place], [place] [address]    Gram Stain   Final    NO ORGANISMS SEEN WBC SEEN RED BLOOD CELLS RESULT CALLED TO, READ BACK BY AND VERIFIED WITH: KATIE RAND AT 3810 ON 07/12/21 BY SS Performed at [facility], [address] [place], [place] [address]    Culture (A)  Final    ENTEROBACTER CLOACAE CRITICAL RESULT CALLED TO, READ BACK BY AND VERIFIED WITH: RN S.HINO AT 1158 ON 07/14/2021 BY T.SAAD. Performed at [facility], [place] [address] [place], [place] [address]    Report Status 07/15/2021 FINAL  Final   Organism ID, Bacteria ENTEROBACTER CLOACAE  Final      Susceptibility   Enterobacter cloacae - MIC*    CEFAZOLIN >=64 RESISTANT Resistant     CEFEPIME <=0.12 SENSITIVE Sensitive     CEFTAZIDIME <=1 SENSITIVE Sensitive     CIPROFLOXACIN <=0.25 SENSITIVE Sensitive     GENTAMICIN <=1 SENSITIVE Sensitive     IMIPENEM 0.5 SENSITIVE Sensitive     TRIMETH/SULFA <=20 SENSITIVE Sensitive     PIP/TAZO <=4  SENSITIVE Sensitive     * ENTEROBACTER CLOACAE  Culture, blood (Routine X 2) w Reflex to ID Panel     Status: None (Preliminary result)   Collection Time: 07/14/21  6:28 AM   Specimen: BLOOD  Result Value Ref Range Status   Specimen Description BLOOD RIGHT HAND  Final   Special Requests   Final    BOTTLES DRAWN AEROBIC AND ANAEROBIC Blood Culture adequate volume   Culture   Final    NO GROWTH 2 DAYS Performed at [facility] [address] [place], [place] [address]    Report Status PENDING  Incomplete      Radiology Studies: No results found.  Scheduled Meds:  amLODipine  7.5 mg Oral Daily   apixaban  5 mg Oral BID   calcium carbonate  400 mg of elemental calcium Oral TID   citalopram  20 mg Oral Daily   levothyroxine  75 mcg Oral Daily   loratadine  10 mg Oral Daily   losartan  50 mg Oral Daily   pantoprazole (PROTONIX) IV  40 mg Intravenous Q24H   senna  1 tablet Oral BID   Continuous Infusions:  ceFEPime (MAXIPIME) IV Stopped (07/16/21 0653)     LOS: 4 days   Time spent: 37 minutes. More than 50% of the time was spent in counseling/coordination of care  Lorella Nimrod, MD [facility]  If 7PM-7AM, please contact night-coverage [email or web address]  07/16/2021, 3:29 PM   This record has been created using Systems analyst. Errors have been sought and corrected,but may not always be located. Such creation errors do not reflect on the standard of care.

## 2021-07-16 NOTE — Progress Notes (Signed)
Peripherally Inserted Central Catheter Placement  The IV Nurse has discussed with the patient and/or persons authorized to consent for the patient, the purpose of this procedure and the potential benefits and risks involved with this procedure.  The benefits include less needle sticks, lab draws from the catheter, and the patient may be discharged home with the catheter. Risks include, but not limited to, infection, bleeding, blood clot (thrombus formation), and puncture of an artery; nerve damage and irregular heartbeat and possibility to perform a PICC exchange if needed/ordered by physician.  Alternatives to this procedure were also discussed.  Bard Power PICC patient education guide, fact sheet on infection prevention and patient information card has been provided to patient /or left at bedside.    PICC Placement Documentation  PICC Single Lumen 07/16/21 Right Brachial 35 cm 0 cm (Active)  Indication for Insertion or Continuance of Line Home intravenous therapies (PICC only) 07/16/21 2240  Exposed Catheter (cm) 0 cm 07/16/21 2240  Site Assessment Clean;Dry;Intact 07/16/21 2240  Line Status Saline locked;Flushed;Blood return noted 07/16/21 2240  Dressing Type Transparent;Securing device 07/16/21 2240  Dressing Status Clean;Dry;Intact 07/16/21 2240  Antimicrobial disc in place? Yes 07/16/21 [place] Not Applicable 35/57/32 2025  Line Care Connections checked and tightened 07/16/21 2240  Line Adjustment (NICU/IV Team Only) No 07/16/21 2240  Dressing Intervention New dressing 07/16/21 2240  Dressing Change Due 07/23/21 07/16/21 [place], [place] 07/16/2021, 10:44 PM

## 2021-07-16 NOTE — Progress Notes (Signed)
    Attending Progress Note  History: Latoya Freeman is here for fevers and concern for meningitis. CSF positive for gram negative rods.  Interval History: Latoya Freeman continues to be neurologically stable. Appears more oriented today  Physical Exam: Vitals:   07/16/21 0618 07/16/21 0725  BP: (!) 162/69 (!) 168/71  Pulse: (!) 59 (!) 58  Resp:    Temp: 98 F (36.7 C) 98.2 F (36.8 C)  SpO2: 94% 93%    AA x3 CNI  Strength:good and equal strength throughout/ Incision c/d/I. No signs of infection.   Data:  Recent Labs  Lab 07/13/21 0624 07/14/21 0628 07/15/21 0408  NA 142 140 140  K 4.3 3.8 3.6  CL 113* 112* 107  CO2 24 21* 24  BUN 19 17 16   CREATININE 1.20* 1.11* 1.02*  GLUCOSE 133* 102* 88  CALCIUM 10.1 10.0 9.7    Recent Labs  Lab 07/12/21 0533  AST 20  ALT 12  ALKPHOS 53      Recent Labs  Lab 07/13/21 0624 07/14/21 0628 07/15/21 0408  WBC 22.0* 19.5* 14.0*  HGB 12.5 10.5* 11.0*  HCT 34.9* 30.5* 31.7*  PLT 235 235 223    Recent Labs  Lab 07/12/21 1044  INR 1.0          Other tests/results:  - (07/13/21 CTA head)  IMPRESSION: 1. Postoperative changes from previous left pterional craniotomy for surgical clipping of a left ICA aneurysm. No visible residual aneurysm evident by CTA. 2. Mild for age intracranial atherosclerotic disease with no large vessel occlusion, hemodynamically significant stenosis, or other acute vascular abnormality. 3. Age-related cerebral atrophy with moderate chronic microvascular ischemic disease. No other acute intracranial abnormality.  Electronically Signed   By: Jeannine Boga M.D.   On: 07/13/2021 22:39  - (07/12/21 CT L-spine) IMPRESSION: Postoperative changes at L3-L4, L4-L5, and L5-S1. Nonspecific density in the laminectomy beds may reflect fluid and granulation tissue. Infection is not well evaluated. The canal is not well evaluated at the operative levels but stenosis does  appear decreased. Electronically Signed   By: Macy Mis M.D.   On: 07/12/2021 18:12    Assessment/Plan:  Latoya Freeman is a 80 y.o presenting with headache in the setting of recent L4-S1 laminectomies. LP positive for gram negative rods.  - continue antibiotics per ID recs - pain control - ok for DVT prophylaxis - Sutures were removed today without complications. Please monitor for drainage and call neurosurgery with any concerns. - no plan for acute neurosurgical intervention at this time.  [facility]oper Render Department of Neurosurgery

## 2021-07-16 NOTE — TOC Progression Note (Addendum)
Transition of Care [facility]) - Progression Note    Patient Details  Name: DALAYZA ZAMBRANA MRN: [id number] Date of Birth: 11-27-1940  Transition of [facility]) CM/SW [place], LCSW Phone Number: 07/16/2021, 11:43 AM  Clinical Narrative:  Made referral to [facility] for home IV abx. OT is going to see patient later today to determine if she's still appropriate for home vs needing SNF. PT added to message as well.   3:43 pm: Tried calling daughter to discuss SNF but voicemail is full. TOC coworker said when she talked with her yesterday, she was asking about SNF placement. Sent out referral.  4:07 pm: Received call back to daughter. She confirmed interest in SNF. Will follow up with her tomorrow regarding bed offers. She said patient has had one COVID booster and will try to find her card.  Expected Discharge Plan: [place] Barriers to Discharge: Continued Medical Work up  Expected Discharge Plan and Services Expected Discharge Plan: [place] Choice: [place] arrangements for the past 2 months: Single Family Home                                       Social Determinants of Health (SDOH) Interventions    Readmission Risk Interventions No flowsheet data found.

## 2021-07-16 NOTE — Care Management Important Message (Signed)
Important Message  Patient Details  Name: Latoya Freeman MRN: [id number] Date of Birth: [birth year]   Medicare Important Message Given:  Yes     Dannette Barbara 07/16/2021, 1:11 PM

## 2021-07-17 DIAGNOSIS — G009 Bacterial meningitis, unspecified: Secondary | ICD-10-CM | POA: Diagnosis not present

## 2021-07-17 LAB — CULTURE, BLOOD (ROUTINE X 2)
Culture: NO GROWTH
Special Requests: ADEQUATE

## 2021-07-17 MED ORDER — PANTOPRAZOLE SODIUM 40 MG PO TBEC
40.0000 mg | DELAYED_RELEASE_TABLET | Freq: Every day | ORAL | Status: DC
  Administered 2021-07-17 – 2021-07-21 (×5): 40 mg via ORAL
  Filled 2021-07-17 (×5): qty 1

## 2021-07-17 NOTE — Progress Notes (Signed)
PROGRESS NOTE    Latoya Freeman  HBZ:[id number] DOB: 1941-01-11 DOA: 07/12/2021 PCP: [name], MD   Brief Narrative: Taken from prior notes. 79 y f with k/h/o hypothyroidism, HTN, GERD, s/p recent L3-L4 & L4-L5 discectomy and L5-S1 decompression on 07/01/21 admitted for worst headache of life. CSF c/s bacterial meningitis, growing Enterobacter cloacae, nosocomial infection per ID.  Infectious disease de-escalated antibiotics to cefepime according to susceptibility results.  Initially received meropenem and vancomycin. Will need 3 weeks of antibiotics, stop date is 08/02/2021.  Neurosurgery was consulted due to her history of recent L3-S1 decompression.  Per neurosurgery no sign of surgical infection.  Sutures still in place which needs to be removed before discharge by neurosurgery.  OT is now recommending SNF.  Daughter might not be able to take care of her at home. TOC is working on placement.  Subjective: Patient was seen and examined today.  No new complaints.  Waiting for rehab bed.  Assessment & Plan:   Principal Problem:   Headache Active Problems:   GERD (gastroesophageal reflux disease)   Thyroid disease   History of pulmonary embolism   Hypokalemia   Obesity (BMI 30-39.9)   Benign hypertension with CKD (chronic kidney disease) stage III (HCC)   Dehydration   Leukocytosis  Suspected bacterial meningitis.  Some improvement in leukocytosis.  CSF cultures with Enterobacter closure, and nosocomial infection.  Blood cultures negative. History of recent spinal surgery, L3-S1 decompression with dural breach. Infectious diseases on board.  Initially received meropenem and vancomycin which were switched with cefepime and according to susceptibility results. -Continue cefepime-till 08/02/2021. -PICC line placed.  History of PE.  Eliquis was initially held due to elevated RBC on CSF.  Neurosurgery wants to have a CTA before resuming it which shows no acute abnormality, prior  ICA aneurysm repaired. -Eliquis was resumed after having a CTA without any acute abnormality or bleeding.  Hypothyroidism. -Continue Synthroid  AKI with underlying CKD stage IIIa.  Creatinine improving and appears to be around his baseline now.  Patient received IV fluid. -Encourage p.o. hydration  Concern of delirium/mild underlying cognitive impairment.  Per daughter patient did not had any cognitive impairment before recent hospitalizations.  Do endorse having concern of delirium and hallucinations during recent hospitalization for surgery. -Delirium precautions -Continue to monitor  Hypertension.  On multiple antihypertensives at home which were initially held due to softer blood pressure.  Blood pressure mildly elevated.  Home meds were restarted. -Continue home meds and monitor.  Generalized weakness. -PT/OT is recommending home health services, but daughter does not think that she will be able to provide the care she needs at this time.  Patient lives alone at baseline. -Repeat OT evaluation is suggesting SNF placement. -Repeat PT evaluation pending  Class II obesity. Estimated body mass index is 36.87 kg/m as calculated from the following:   Height as of this encounter: 5\' 1"  (1.549 m).   Weight as of this encounter: 88.5 kg.   Objective: Vitals:   07/16/21 0725 07/16/21 1927 07/17/21 0505 07/17/21 0806  BP: (!) 168/71 (!) 141/84 (!) 156/60 (!) 165/69  Pulse: (!) 58 62 (!) 55 (!) 52  Resp:  18 18 16   Temp: 98.2 F (36.8 C) 98.6 F (37 C) 98.2 F (36.8 C) 98.5 F (36.9 C)  TempSrc: Oral Oral Oral   SpO2: 93% 96% 96% 95%  Weight:      Height:        Intake/Output Summary (Last 24 hours) at 07/17/2021 1552 Last  data filed at 07/17/2021 1023 Gross per 24 hour  Intake 480 ml  Output 2900 ml  Net -2420 ml    Filed Weights   07/12/21 0529  Weight: 88.5 kg    Examination:  General.  Pleasant elderly lady, in no acute distress. Pulmonary.  Lungs clear  bilaterally, normal respiratory effort. CV.  Regular rate and rhythm, no JVD, rub or murmur. Abdomen.  Soft, nontender, nondistended, BS positive. CNS.  Alert and oriented x3.  No focal neurologic deficit. Extremities.  No edema, no cyanosis, pulses intact and symmetrical. Psychiatry.  Judgment and insight appears normal.   DVT prophylaxis: SCDs Code Status: Full Family Communication:  Disposition Plan:  Status is: Inpatient  Remains inpatient appropriate because:Inpatient level of care appropriate due to severity of illness  Dispo: The patient is from: Home              Anticipated d/c is to: SNF              Patient currently is medically stable.   Difficult to place patient No               Level of care: Med-Surg  All the records are reviewed and case discussed with Care Management/Social Worker. Management plans discussed with the patient, nursing and they are in agreement.  Consultants:  Infectious disease  Procedures:  Lumbar puncture  Antimicrobials:  Cefepime  Data Reviewed: I have personally reviewed following labs and imaging studies  CBC: Recent Labs  Lab 07/12/21 0533 07/13/21 0624 07/14/21 0628 07/15/21 0408  WBC 20.7* 22.0* 19.5* 14.0*  NEUTROABS 16.4*  --   --   --   HGB 13.7 12.5 10.5* 11.0*  HCT 38.5 34.9* 30.5* 31.7*  MCV 88.5 91.8 91.6 94.6  PLT 329 235 235 992    Basic Metabolic Panel: Recent Labs  Lab 07/12/21 0533 07/12/21 1044 07/13/21 0624 07/14/21 0628 07/15/21 0408  NA 138  --  142 140 140  K 2.8*  --  4.3 3.8 3.6  CL 105  --  113* 112* 107  CO2 22  --  24 21* 24  GLUCOSE 133*  --  133* 102* 88  BUN 25*  --  19 17 16   CREATININE 1.63*  --  1.20* 1.11* 1.02*  CALCIUM 10.2  --  10.1 10.0 9.7  MG  --  1.9  --   --   --     GFR: Estimated Creatinine Clearance: 45.3 mL/min (A) (by C-G formula based on SCr of 1.02 mg/dL (H)). Liver Function Tests: Recent Labs  Lab 07/12/21 0533  AST 20  ALT 12  ALKPHOS 53  BILITOT 0.9   PROT 7.2  ALBUMIN 4.0    No results for input(s): LIPASE, AMYLASE in the last 168 hours. No results for input(s): AMMONIA in the last 168 hours. Coagulation Profile: Recent Labs  Lab 07/12/21 1044  INR 1.0    Cardiac Enzymes: No results for input(s): CKTOTAL, CKMB, CKMBINDEX, TROPONINI in the last 168 hours. BNP (last 3 results) No results for input(s): PROBNP in the last 8760 hours. HbA1C: No results for input(s): HGBA1C in the last 72 hours. CBG: Recent Labs  Lab 07/12/21 0546  GLUCAP 132*    Lipid Profile: No results for input(s): CHOL, HDL, LDLCALC, TRIG, CHOLHDL, LDLDIRECT in the last 72 hours. Thyroid Function Tests: No results for input(s): TSH, T4TOTAL, FREET4, T3FREE, THYROIDAB in the last 72 hours. Anemia Panel: No results for input(s): VITAMINB12, FOLATE, FERRITIN, TIBC, IRON,  RETICCTPCT in the last 72 hours. Sepsis Labs: No results for input(s): PROCALCITON, LATICACIDVEN in the last 168 hours.  Recent Results (from the past 240 hour(s))  Resp Panel by RT-PCR (Flu A&B, Covid) Nasopharyngeal Swab     Status: None   Collection Time: 07/12/21 10:44 AM   Specimen: Nasopharyngeal Swab; Nasopharyngeal(NP) swabs in vial transport medium  Result Value Ref Range Status   SARS Coronavirus 2 by RT PCR NEGATIVE NEGATIVE Final    Comment: (NOTE) SARS-CoV-2 target nucleic acids are NOT DETECTED.  The SARS-CoV-2 RNA is generally detectable in upper respiratory specimens during the acute phase of infection. The lowest concentration of SARS-CoV-2 viral copies this assay can detect is 138 copies/mL. A negative result does not preclude SARS-Cov-2 infection and should not be used as the sole basis for treatment or other patient management decisions. A negative result may occur with  improper specimen collection/handling, submission of specimen other than nasopharyngeal swab, presence of viral mutation(s) within the areas targeted by this assay, and inadequate number of  viral copies(<138 copies/mL). A negative result must be combined with clinical observations, patient history, and epidemiological information. The expected result is Negative.  Fact Sheet for Patients:  EntrepreneurPulse.com.au  Fact Sheet for Healthcare Providers:  IncredibleEmployment.be  This test is no t yet approved or cleared by the [place] FDA and  has been authorized for detection and/or diagnosis of SARS-CoV-2 by FDA under an Emergency Use Authorization (EUA). This EUA will remain  in effect (meaning this test can be used) for the duration of the COVID-19 declaration under Section 564(b)(1) of the Act, 21 U.S.C.section 360bbb-3(b)(1), unless the authorization is terminated  or revoked sooner.       Influenza A by PCR NEGATIVE NEGATIVE Final   Influenza B by PCR NEGATIVE NEGATIVE Final    Comment: (NOTE) The Xpert Xpress SARS-CoV-2/FLU/RSV plus assay is intended as an aid in the diagnosis of influenza from Nasopharyngeal swab specimens and should not be used as a sole basis for treatment. Nasal washings and aspirates are unacceptable for Xpert Xpress SARS-CoV-2/FLU/RSV testing.  Fact Sheet for Patients: EntrepreneurPulse.com.au  Fact Sheet for Healthcare Providers: IncredibleEmployment.be  This test is not yet approved or cleared by the [place] FDA and has been authorized for detection and/or diagnosis of SARS-CoV-2 by FDA under an Emergency Use Authorization (EUA). This EUA will remain in effect (meaning this test can be used) for the duration of the COVID-19 declaration under Section 564(b)(1) of the Act, 21 U.S.C. section 360bbb-3(b)(1), unless the authorization is terminated or revoked.  Performed at [facility], [address] [place], [place] [address]   Blood culture (routine x 2)     Status: None   Collection Time: 07/12/21  1:15 PM   Specimen: BLOOD  Result  Value Ref Range Status   Specimen Description BLOOD RIGHT HAND  Final   Special Requests   Final    BOTTLES DRAWN AEROBIC AND ANAEROBIC Blood Culture adequate volume   Culture   Final    NO GROWTH 5 DAYS Performed at [facility], [address] [place], [place] [address]    Report Status 07/17/2021 FINAL  Final  CSF culture w Gram Stain     Status: Abnormal   Collection Time: 07/12/21  1:20 PM   Specimen: PATH Cytology CSF; Cerebrospinal Fluid  Result Value Ref Range Status   Specimen Description   Final    CSF TUBE 4 Performed at [facility], [address],  [place], [place] [address]    Special Requests   Final    NONE Performed at [facility], [address] [place], [place] [address]    Gram Stain   Final    NO ORGANISMS SEEN WBC SEEN RED BLOOD CELLS RESULT CALLED TO, READ BACK BY AND VERIFIED WITH: KATIE RAND AT 7253 ON 07/12/21 BY SS Performed at [facility], [address] [place], [place] [address]    Culture (A)  Final    ENTEROBACTER CLOACAE CRITICAL RESULT CALLED TO, READ BACK BY AND VERIFIED WITH: RN S.HINO AT 1158 ON 07/14/2021 BY T.SAAD. Performed at [facility], [place] [address] [place], [place] [address]    Report Status 07/15/2021 FINAL  Final   Organism ID, Bacteria ENTEROBACTER CLOACAE  Final      Susceptibility   Enterobacter cloacae - MIC*    CEFAZOLIN >=64 RESISTANT Resistant     CEFEPIME <=0.12 SENSITIVE Sensitive     CEFTAZIDIME <=1 SENSITIVE Sensitive     CIPROFLOXACIN <=0.25 SENSITIVE Sensitive     GENTAMICIN <=1 SENSITIVE Sensitive     IMIPENEM 0.5 SENSITIVE Sensitive     TRIMETH/SULFA <=20 SENSITIVE Sensitive     PIP/TAZO <=4 SENSITIVE Sensitive     * ENTEROBACTER CLOACAE  Culture, blood (Routine X 2) w Reflex to ID Panel     Status: None (Preliminary result)   Collection Time: 07/14/21  6:28 AM   Specimen: BLOOD  Result Value Ref Range Status   Specimen Description BLOOD RIGHT HAND   Final   Special Requests   Final    BOTTLES DRAWN AEROBIC AND ANAEROBIC Blood Culture adequate volume   Culture   Final    NO GROWTH 3 DAYS Performed at [facility], [address], [place], [place] [address]    Report Status PENDING  Incomplete      Radiology Studies: [place] EKG SITE RITE  Result Date: 07/16/2021 If Site Rite image not attached, placement could not be confirmed due to current cardiac rhythm.   Scheduled Meds:  amLODipine  7.5 mg Oral Daily   apixaban  5 mg Oral BID   Chlorhexidine Gluconate Cloth  6 each Topical Daily   citalopram  20 mg Oral Daily   levothyroxine  75 mcg Oral Daily   loratadine  10 mg Oral Daily   losartan  50 mg Oral Daily   pantoprazole  40 mg Oral Daily   senna  1 tablet Oral BID   sodium chloride flush  10-40 mL Intracatheter Q12H   triamterene-hydrochlorothiazide  1 tablet Oral Daily   Continuous Infusions:  ceFEPime (MAXIPIME) IV 2 g (07/17/21 0509)     LOS: 5 days   Time spent: 34 minutes. More than 50% of the time was spent in counseling/coordination of care  Lorella Nimrod, MD [facility]  If 7PM-7AM, please contact night-coverage [email or web address]  07/17/2021, 3:52 PM   This record has been created using Systems analyst. Errors have been sought and corrected,but may not always be located. Such creation errors do not reflect on the standard of care.

## 2021-07-17 NOTE — Progress Notes (Signed)
   07/17/21 0910  Clinical Encounter Type  Visited With Patient  Visit Type Initial;Spiritual support;Social support  Referral From Chaplain  Consult/Referral To [place] checked in with PT to establish care. PT expressed how pleased she was about the chaplain stopping in and how happy she has been with staff. Chaplain proved a clam presence and gave space for storytelling. PT stated she should be discharged soon to a facility. Chaplain is available if needed.   Andee Poles, M. Div.

## 2021-07-17 NOTE — Progress Notes (Signed)
PHARMACIST - PHYSICIAN COMMUNICATION  DR: Hospitalist  CONCERNING: IV to Oral Route Change Policy  RECOMMENDATION: This patient is receiving pantoprazole by the intravenous route.  Based on criteria approved by the Pharmacy and Therapeutics Committee, the intravenous medication(s) is/are being converted to the equivalent oral dose form(s).   DESCRIPTION: These criteria include: The patient is eating (either orally or via tube) and/or has been taking other orally administered medications for a least 24 hours The patient has no evidence of active gastrointestinal bleeding or impaired GI absorption (gastrectomy, short bowel, patient on TNA or NPO).  If you have questions about this conversion, please contact the Pharmacy Department  []   [phone] )  Latoya Freeman [x]   [phone] )  Latoya Freeman []   [phone] )  [name] []   [phone] )  [facility] []   [phone] )  Hen[place], PharmD, BCPS.   Work Cell: [phone] 07/17/2021 10:04 AM

## 2021-07-17 NOTE — Progress Notes (Signed)
PHARMACY CONSULT NOTE FOR:  OUTPATIENT  PARENTERAL ANTIBIOTIC THERAPY (OPAT)  Indication: Enterobacter cloacae meningitis Regimen: cefepime 2 gram every 12 hours. Change to q8 hours if CrCl > 60 End date: 08/02/2021   IV antibiotic discharge orders are pended. To discharging provider:  please sign these orders via discharge navigator,  Select New Orders & click on the button choice - Manage This Unsigned Work.     Thank you for allowing pharmacy to be a part of this patient's care.  Wynelle Cleveland, PharmD Pharmacy Resident  07/17/2021 9:46 AM

## 2021-07-17 NOTE — TOC Progression Note (Addendum)
Transition of Care [facility]) - Progression Note    Patient Details  Name: Latoya Freeman MRN: [id number] Date of Birth: [birth year]  Transition [facility] CM/SW Contact  Candie Chroman, LCSW Phone Number: 07/17/2021, 8:38 AM  Clinical Narrative:  No bed offers this morning. [facility], [facility], and [facility] still pending.   12:11 pm: Spoke with daughter. She said she discussed facility options with patient last night. 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 Her first preference is [facility]. Second preference would likely be [facility]. Notified daughter of CMS ratings for both. Left message for admissions coordinator at [facility] asking her to review referral.   3:30 pm: [facility] admissions coordinator is waiting for response from their pharmacy regarding IV antibiotic before make decision on bed offer.  4:21 pm: [facility] is able to offer a bed and starting insurance authorization through [place]. Patient is aware and agreeable. Tried calling daughter but voicemail is full. Insurance authorization could potentially take until Monday but weekend [facility] will check with admissions coordinator tomorrow and Sunday.  Expected Discharge Plan: [place] Barriers to Discharge: Continued Medical Work up  Expected Discharge Plan and Services Expected Discharge Plan: [place] Choice: [place] arrangements for the past 2 months: Single Family Home                                       Social Determinants of Health (SDOH) Interventions    Readmission Risk Interventions No flowsheet data found.

## 2021-07-18 DIAGNOSIS — G009 Bacterial meningitis, unspecified: Secondary | ICD-10-CM | POA: Diagnosis not present

## 2021-07-18 NOTE — Progress Notes (Signed)
PROGRESS NOTE    Latoya Freeman  NLZ:[id number] DOB: 09-07-1941 DOA: 07/12/2021 PCP: [name], MD   Brief Narrative: Taken from prior notes. 74 y f with k/h/o hypothyroidism, HTN, GERD, s/p recent L3-L4 & L4-L5 discectomy and L5-S1 decompression on 07/01/21 admitted for worst headache of life. CSF c/s bacterial meningitis, growing Enterobacter cloacae, nosocomial infection per ID.  Infectious disease de-escalated antibiotics to cefepime according to susceptibility results.  Initially received meropenem and vancomycin. Will need 3 weeks of antibiotics, stop date is 08/02/2021.  Neurosurgery was consulted due to her history of recent L3-S1 decompression.  Per neurosurgery no sign of surgical infection.  Sutures still in place which needs to be removed before discharge by neurosurgery.  OT is now recommending SNF.  Daughter might not be able to take care of her at home. TOC is working on placement-had a bed offer at [facility], will not take her over the weekend. Likely discharge on Monday.  Subjective: Patient has no new complaints today.  Appears at baseline.  Feeling little weak.  Waiting for rehab placement.  Assessment & Plan:   Principal Problem:   Headache Active Problems:   GERD (gastroesophageal reflux disease)   Thyroid disease   History of pulmonary embolism   Hypokalemia   Obesity (BMI 30-39.9)   Benign hypertension with CKD (chronic kidney disease) stage III (HCC)   Dehydration   Leukocytosis  Suspected bacterial meningitis.  Some improvement in leukocytosis.  CSF cultures with Enterobacter closure, and nosocomial infection.  Blood cultures negative. History of recent spinal surgery, L3-S1 decompression with dural breach. Infectious diseases on board.  Initially received meropenem and vancomycin which were switched with cefepime and according to susceptibility results. -Continue cefepime-till 08/02/2021. -PICC line placed.  History of PE.  Eliquis was initially held  due to elevated RBC on CSF.  Neurosurgery wants to have a CTA before resuming it which shows no acute abnormality, prior ICA aneurysm repaired. -Eliquis was resumed after having a CTA without any acute abnormality or bleeding.  Hypothyroidism. -Continue Synthroid  AKI with underlying CKD stage IIIa.  Creatinine improving and appears to be around his baseline now.  Patient received IV fluid. -Encourage p.o. hydration  Concern of delirium/mild underlying cognitive impairment.  Per daughter patient did not had any cognitive impairment before recent hospitalizations.  Do endorse having concern of delirium and hallucinations during recent hospitalization for surgery. -Delirium precautions -Continue to monitor  Hypertension.  On multiple antihypertensives at home which were initially held due to softer blood pressure.  Blood pressure mildly elevated.  Home meds were restarted. -Continue home meds and monitor.  Generalized weakness. -PT/OT is recommending home health services, but daughter does not think that she will be able to provide the care she needs at this time.  Patient lives alone at baseline. -Repeat OT evaluation is suggesting SNF placement. -Repeat PT evaluation pending  Class II obesity. Estimated body mass index is 36.87 kg/m as calculated from the following:   Height as of this encounter: 5\' 1"  (1.549 m).   Weight as of this encounter: 88.5 kg.   Objective: Vitals:   07/17/21 1618 07/17/21 1923 07/18/21 0314 07/18/21 0733  BP: 134/62 (!) 146/68 117/62 (!) 146/67  Pulse: (!) 57 (!) 55 (!) 58 60  Resp: 16 20 20 18   Temp: 97.8 F (36.6 C) 99 F (37.2 C) 98.8 F (37.1 C) 98.3 F (36.8 C)  TempSrc: Oral Oral Oral Oral  SpO2: 95% 96% 97% 95%  Weight:  Height:        Intake/Output Summary (Last 24 hours) at 07/18/2021 1322 Last data filed at 07/18/2021 0900 Gross per 24 hour  Intake 240 ml  Output 1700 ml  Net -1460 ml    Filed Weights   07/12/21 0529  Weight:  88.5 kg    Examination:  General.  Well-developed elderly lady, in no acute distress. Pulmonary.  Lungs clear bilaterally, normal respiratory effort. CV.  Regular rate and rhythm, no JVD, rub or murmur. Abdomen.  Soft, nontender, nondistended, BS positive. CNS.  Alert and oriented .  No focal neurologic deficit. Extremities.  No edema, no cyanosis, pulses intact and symmetrical. Psychiatry.  Judgment and insight appears normal.   DVT prophylaxis: Eliquis Code Status: Full Family Communication:  Disposition Plan:  Status is: Inpatient  Remains inpatient appropriate because:Inpatient level of care appropriate due to severity of illness  Dispo: The patient is from: Home              Anticipated d/c is to: SNF              Patient currently is medically stable.   Difficult to place patient No               Level of care: Med-Surg  All the records are reviewed and case discussed with Care Management/Social Worker. Management plans discussed with the patient, nursing and they are in agreement.  Consultants:  Infectious disease  Procedures:  Lumbar puncture  Antimicrobials:  Cefepime  Data Reviewed: I have personally reviewed following labs and imaging studies  CBC: Recent Labs  Lab 07/12/21 0533 07/13/21 0624 07/14/21 0628 07/15/21 0408  WBC 20.7* 22.0* 19.5* 14.0*  NEUTROABS 16.4*  --   --   --   HGB 13.7 12.5 10.5* 11.0*  HCT 38.5 34.9* 30.5* 31.7*  MCV 88.5 91.8 91.6 94.6  PLT 329 235 235 387    Basic Metabolic Panel: Recent Labs  Lab 07/12/21 0533 07/12/21 1044 07/13/21 0624 07/14/21 0628 07/15/21 0408  NA 138  --  142 140 140  K 2.8*  --  4.3 3.8 3.6  CL 105  --  113* 112* 107  CO2 22  --  24 21* 24  GLUCOSE 133*  --  133* 102* 88  BUN 25*  --  19 17 16   CREATININE 1.63*  --  1.20* 1.11* 1.02*  CALCIUM 10.2  --  10.1 10.0 9.7  MG  --  1.9  --   --   --     GFR: Estimated Creatinine Clearance: 45.3 mL/min (A) (by C-G formula based on SCr of  1.02 mg/dL (H)). Liver Function Tests: Recent Labs  Lab 07/12/21 0533  AST 20  ALT 12  ALKPHOS 53  BILITOT 0.9  PROT 7.2  ALBUMIN 4.0    No results for input(s): LIPASE, AMYLASE in the last 168 hours. No results for input(s): AMMONIA in the last 168 hours. Coagulation Profile: Recent Labs  Lab 07/12/21 1044  INR 1.0    Cardiac Enzymes: No results for input(s): CKTOTAL, CKMB, CKMBINDEX, TROPONINI in the last 168 hours. BNP (last 3 results) No results for input(s): PROBNP in the last 8760 hours. HbA1C: No results for input(s): HGBA1C in the last 72 hours. CBG: Recent Labs  Lab 07/12/21 0546  GLUCAP 132*    Lipid Profile: No results for input(s): CHOL, HDL, LDLCALC, TRIG, CHOLHDL, LDLDIRECT in the last 72 hours. Thyroid Function Tests: No results for input(s): TSH, T4TOTAL, FREET4, T3FREE,  THYROIDAB in the last 72 hours. Anemia Panel: No results for input(s): VITAMINB12, FOLATE, FERRITIN, TIBC, IRON, RETICCTPCT in the last 72 hours. Sepsis Labs: No results for input(s): PROCALCITON, LATICACIDVEN in the last 168 hours.  Recent Results (from the past 240 hour(s))  Resp Panel by RT-PCR (Flu A&B, Covid) Nasopharyngeal Swab     Status: None   Collection Time: 07/12/21 10:44 AM   Specimen: Nasopharyngeal Swab; Nasopharyngeal(NP) swabs in vial transport medium  Result Value Ref Range Status   SARS Coronavirus 2 by RT PCR NEGATIVE NEGATIVE Final    Comment: (NOTE) SARS-CoV-2 target nucleic acids are NOT DETECTED.  The SARS-CoV-2 RNA is generally detectable in upper respiratory specimens during the acute phase of infection. The lowest concentration of SARS-CoV-2 viral copies this assay can detect is 138 copies/mL. A negative result does not preclude SARS-Cov-2 infection and should not be used as the sole basis for treatment or other patient management decisions. A negative result may occur with  improper specimen collection/handling, submission of specimen other than  nasopharyngeal swab, presence of viral mutation(s) within the areas targeted by this assay, and inadequate number of viral copies(<138 copies/mL). A negative result must be combined with clinical observations, patient history, and epidemiological information. The expected result is Negative.  Fact Sheet for Patients:  EntrepreneurPulse.com.au  Fact Sheet for Healthcare Providers:  IncredibleEmployment.be  This test is no t yet approved or cleared by the [place] FDA and  has been authorized for detection and/or diagnosis of SARS-CoV-2 by FDA under an Emergency Use Authorization (EUA). This EUA will remain  in effect (meaning this test can be used) for the duration of the COVID-19 declaration under Section 564(b)(1) of the Act, 21 U.S.C.section 360bbb-3(b)(1), unless the authorization is terminated  or revoked sooner.       Influenza A by PCR NEGATIVE NEGATIVE Final   Influenza B by PCR NEGATIVE NEGATIVE Final    Comment: (NOTE) The Xpert Xpress SARS-CoV-2/FLU/RSV plus assay is intended as an aid in the diagnosis of influenza from Nasopharyngeal swab specimens and should not be used as a sole basis for treatment. Nasal washings and aspirates are unacceptable for Xpert Xpress SARS-CoV-2/FLU/RSV testing.  Fact Sheet for Patients: EntrepreneurPulse.com.au  Fact Sheet for Healthcare Providers: IncredibleEmployment.be  This test is not yet approved or cleared by the [place] FDA and has been authorized for detection and/or diagnosis of SARS-CoV-2 by FDA under an Emergency Use Authorization (EUA). This EUA will remain in effect (meaning this test can be used) for the duration of the COVID-19 declaration under Section 564(b)(1) of the Act, 21 U.S.C. section 360bbb-3(b)(1), unless the authorization is terminated or revoked.  Performed at [facility], [address] [place], [place]  [address]   Blood culture (routine x 2)     Status: None   Collection Time: 07/12/21  1:15 PM   Specimen: BLOOD  Result Value Ref Range Status   Specimen Description BLOOD RIGHT HAND  Final   Special Requests   Final    BOTTLES DRAWN AEROBIC AND ANAEROBIC Blood Culture adequate volume   Culture   Final    NO GROWTH 5 DAYS Performed at [facility], [address] [place], [place] [address]    Report Status 07/17/2021 FINAL  Final  CSF culture w Gram Stain     Status: Abnormal   Collection Time: 07/12/21  1:20 PM   Specimen: PATH Cytology CSF; Cerebrospinal Fluid  Result Value Ref Range Status   Specimen Description  Final    CSF TUBE 4 Performed at [facility], [address] [place], [place] [address]    Special Requests   Final    NONE Performed at [facility], [place], [place] [address]    Gram Stain   Final    NO ORGANISMS SEEN WBC SEEN RED BLOOD CELLS RESULT CALLED TO, READ BACK BY AND VERIFIED WITH: KATIE RAND AT 7096 ON 07/12/21 BY SS Performed at [facility], [address] [place], [place] [address]    Culture (A)  Final    ENTEROBACTER CLOACAE CRITICAL RESULT CALLED TO, READ BACK BY AND VERIFIED WITH: RN S.HINO AT 1158 ON 07/14/2021 BY T.SAAD. Performed at [facility], [place] [address], [place], [place] [address]    Report Status 07/15/2021 FINAL  Final   Organism ID, Bacteria ENTEROBACTER CLOACAE  Final      Susceptibility   Enterobacter cloacae - MIC*    CEFAZOLIN >=64 RESISTANT Resistant     CEFEPIME <=0.12 SENSITIVE Sensitive     CEFTAZIDIME <=1 SENSITIVE Sensitive     CIPROFLOXACIN <=0.25 SENSITIVE Sensitive     GENTAMICIN <=1 SENSITIVE Sensitive     IMIPENEM 0.5 SENSITIVE Sensitive     TRIMETH/SULFA <=20 SENSITIVE Sensitive     PIP/TAZO <=4 SENSITIVE Sensitive     * ENTEROBACTER CLOACAE  Culture, blood (Routine X 2) w Reflex to ID Panel     Status: None (Preliminary result)   Collection  Time: 07/14/21  6:28 AM   Specimen: BLOOD  Result Value Ref Range Status   Specimen Description BLOOD RIGHT HAND  Final   Special Requests   Final    BOTTLES DRAWN AEROBIC AND ANAEROBIC Blood Culture adequate volume   Culture   Final    NO GROWTH 4 DAYS Performed at [facility], [address], [place],  [address]    Report Status PENDING  Incomplete      Radiology Studies: [place] EKG SITE RITE  Result Date: 07/16/2021 If Site Rite image not attached, placement could not be confirmed due to current cardiac rhythm.   Scheduled Meds:  amLODipine  7.5 mg Oral Daily   apixaban  5 mg Oral BID   Chlorhexidine Gluconate Cloth  6 each Topical Daily   citalopram  20 mg Oral Daily   levothyroxine  75 mcg Oral Daily   loratadine  10 mg Oral Daily   losartan  50 mg Oral Daily   pantoprazole  40 mg Oral Daily   senna  1 tablet Oral BID   sodium chloride flush  10-40 mL Intracatheter Q12H   triamterene-hydrochlorothiazide  1 tablet Oral Daily   Continuous Infusions:  ceFEPime (MAXIPIME) IV 2 g (07/18/21 0604)     LOS: 6 days   Time spent: 30 minutes. More than 50% of the time was spent in counseling/coordination of care  Lorella Nimrod, MD [facility]  If 7PM-7AM, please contact night-coverage [email or web address]  07/18/2021, 1:22 PM   This record has been created using Systems analyst. Errors have been sought and corrected,but may not always be located. Such creation errors do not reflect on the standard of care.

## 2021-07-18 NOTE — TOC Progression Note (Addendum)
Transition of Care [facility]) - Progression Note    Patient Details  Name: Latoya Freeman MRN: [id number] Date of Birth: Dec 21, 1940  Transition of Care [facility]) CM/SW [place], LCSW Phone Number: 07/18/2021, 9:09 AM  Clinical Narrative:   Reached out to [place] at [facility]. Lavella Lemons reported they are not taking patients over the weekend due to staffing issues. Will follow up with TOC on Monday.     Expected Discharge Plan: [place] Barriers to Discharge: Continued Medical Work up  Expected Discharge Plan and Services Expected Discharge Plan: [place] Choice: [place] arrangements for the past 2 months: Single Family Home                                       Social Determinants of Health (SDOH) Interventions    Readmission Risk Interventions No flowsheet data found.

## 2021-07-18 NOTE — Progress Notes (Signed)
Mobility Specialist - Progress Note   07/18/21 1300  Mobility  Activity Ambulated to bathroom  Level of Assistance Minimal assist, patient does 75% or more  Assistive Device Front wheel walker  Distance Ambulated (ft) 30 ft  Mobility Out of bed for toileting  Mobility Response Tolerated well  Mobility performed by Mobility specialist  $Mobility charge 1 Mobility    Post-mobility: 79 HR, 99% SpO2  Pt ambulated from recliner to bathroom for urinary output. Further ambulation deferred d/t dizziness developing. Pt voiced vision blurring and seeing speckles. Pt returned to EOB---BP checked 141/74 seated and 125/75 standing. Pt returned supine with alarm set. Headache pain present. RN notified.    Kathee Delton Mobility Specialist 07/18/21, 1:59 PM

## 2021-07-19 DIAGNOSIS — G009 Bacterial meningitis, unspecified: Secondary | ICD-10-CM | POA: Diagnosis not present

## 2021-07-19 LAB — CULTURE, BLOOD (ROUTINE X 2)
Culture: NO GROWTH
Special Requests: ADEQUATE

## 2021-07-19 NOTE — Progress Notes (Signed)
PROGRESS NOTE    Latoya Freeman  IOX:[id number] DOB: 07-12-1941 DOA: 07/12/2021 PCP: [name], MD   Brief Narrative: Taken from prior notes. 37 y f with k/h/o hypothyroidism, HTN, GERD, s/p recent L3-L4 & L4-L5 discectomy and L5-S1 decompression on 07/01/21 admitted for worst headache of life. CSF c/s bacterial meningitis, growing Enterobacter cloacae, nosocomial infection per ID.  Infectious disease de-escalated antibiotics to cefepime according to susceptibility results.  Initially received meropenem and vancomycin. Will need 3 weeks of antibiotics, stop date is 08/02/2021.  Neurosurgery was consulted due to her history of recent L3-S1 decompression.  Per neurosurgery no sign of surgical infection.  Sutures still in place which needs to be removed before discharge by neurosurgery.  OT is now recommending SNF.  Daughter might not be able to take care of her at home. TOC is working on placement-had a bed offer at [facility], will not take her over the weekend. Likely discharge on Monday.  Subjective: Patient was seen and examined. No new complaints.  Eating and drinking okay, stating that she is slowly improving.  Would like to go to rehab as soon as possible.  Assessment & Plan:   Principal Problem:   Headache Active Problems:   GERD (gastroesophageal reflux disease)   Thyroid disease   History of pulmonary embolism   Hypokalemia   Obesity (BMI 30-39.9)   Benign hypertension with CKD (chronic kidney disease) stage III (HCC)   Dehydration   Leukocytosis  Suspected bacterial meningitis.  Some improvement in leukocytosis.  CSF cultures with Enterobacter closure, and nosocomial infection.  Blood cultures negative. History of recent spinal surgery, L3-S1 decompression with dural breach. Infectious diseases on board.  Initially received meropenem and vancomycin which were switched with cefepime and according to susceptibility results. -Continue cefepime-till 08/02/2021. -PICC line  placed.  History of PE.  Eliquis was initially held due to elevated RBC on CSF.  Neurosurgery wants to have a CTA before resuming it which shows no acute abnormality, prior ICA aneurysm repaired. -Eliquis was resumed after having a CTA without any acute abnormality or bleeding.  Hypothyroidism. -Continue Synthroid  AKI with underlying CKD stage IIIa.  Creatinine improving and appears to be around his baseline now.  Patient received IV fluid. -Encourage p.o. hydration  Concern of delirium/mild underlying cognitive impairment.  Per daughter patient did not had any cognitive impairment before recent hospitalizations.  Do endorse having concern of delirium and hallucinations during recent hospitalization for surgery. -Delirium precautions -Continue to monitor  Hypertension.  On multiple antihypertensives at home which were initially held due to softer blood pressure.  Blood pressure mildly elevated.  Home meds were restarted. -Continue home meds and monitor.  Generalized weakness. -PT/OT is recommending home health services, but daughter does not think that she will be able to provide the care she needs at this time.  Patient lives alone at baseline. -Repeat PT/OT evaluation is suggesting SNF placement.  Class II obesity. Estimated body mass index is 36.87 kg/m as calculated from the following:   Height as of this encounter: 5\' 1"  (1.549 m).   Weight as of this encounter: 88.5 kg.   Objective: Vitals:   07/18/21 0733 07/18/21 1610 07/18/21 2001 07/19/21 0736  BP: (!) 146/67 134/70 134/74 138/72  Pulse: 60 70 72 68  Resp: 18 18 16 18   Temp: 98.3 F (36.8 C) 98.4 F (36.9 C) 98.7 F (37.1 C) 98 F (36.7 C)  TempSrc: Oral Oral Oral Oral  SpO2: 95% 95% 96% 97%  Weight:  Height:        Intake/Output Summary (Last 24 hours) at 07/19/2021 1440 Last data filed at 07/19/2021 1348 Gross per 24 hour  Intake 750.33 ml  Output 750 ml  Net 0.33 ml    Filed Weights   07/12/21  0529  Weight: 88.5 kg    Examination:  General.  Pleasant elderly lady, in no acute distress. Pulmonary.  Lungs clear bilaterally, normal respiratory effort. CV.  Regular rate and rhythm, no JVD, rub or murmur. Abdomen.  Soft, nontender, nondistended, BS positive. CNS.  Alert and oriented .  No focal neurologic deficit. Extremities.  No edema, no cyanosis, pulses intact and symmetrical. Psychiatry.  Judgment and insight appears normal.   DVT prophylaxis: Eliquis Code Status: Full Family Communication: Talked with daughter on phone. Disposition Plan:  Status is: Inpatient  Remains inpatient appropriate because:Inpatient level of care appropriate due to severity of illness  Dispo: The patient is from: Home              Anticipated d/c is to: SNF              Patient currently is medically stable.   Difficult to place patient No               Level of care: Med-Surg  All the records are reviewed and case discussed with Care Management/Social Worker. Management plans discussed with the patient, nursing and they are in agreement.  Consultants:  Infectious disease  Procedures:  Lumbar puncture  Antimicrobials:  Cefepime  Data Reviewed: I have personally reviewed following labs and imaging studies  CBC: Recent Labs  Lab 07/13/21 0624 07/14/21 0628 07/15/21 0408  WBC 22.0* 19.5* 14.0*  HGB 12.5 10.5* 11.0*  HCT 34.9* 30.5* 31.7*  MCV 91.8 91.6 94.6  PLT 235 235 081    Basic Metabolic Panel: Recent Labs  Lab 07/13/21 0624 07/14/21 0628 07/15/21 0408  NA 142 140 140  K 4.3 3.8 3.6  CL 113* 112* 107  CO2 24 21* 24  GLUCOSE 133* 102* 88  BUN 19 17 16   CREATININE 1.20* 1.11* 1.02*  CALCIUM 10.1 10.0 9.7    GFR: Estimated Creatinine Clearance: 45.3 mL/min (A) (by C-G formula based on SCr of 1.02 mg/dL (H)). Liver Function Tests: No results for input(s): AST, ALT, ALKPHOS, BILITOT, PROT, ALBUMIN in the last 168 hours.  No results for input(s): LIPASE,  AMYLASE in the last 168 hours. No results for input(s): AMMONIA in the last 168 hours. Coagulation Profile: No results for input(s): INR, PROTIME in the last 168 hours.  Cardiac Enzymes: No results for input(s): CKTOTAL, CKMB, CKMBINDEX, TROPONINI in the last 168 hours. BNP (last 3 results) No results for input(s): PROBNP in the last 8760 hours. HbA1C: No results for input(s): HGBA1C in the last 72 hours. CBG: No results for input(s): GLUCAP in the last 168 hours.  Lipid Profile: No results for input(s): CHOL, HDL, LDLCALC, TRIG, CHOLHDL, LDLDIRECT in the last 72 hours. Thyroid Function Tests: No results for input(s): TSH, T4TOTAL, FREET4, T3FREE, THYROIDAB in the last 72 hours. Anemia Panel: No results for input(s): VITAMINB12, FOLATE, FERRITIN, TIBC, IRON, RETICCTPCT in the last 72 hours. Sepsis Labs: No results for input(s): PROCALCITON, LATICACIDVEN in the last 168 hours.  Recent Results (from the past 240 hour(s))  Resp Panel by RT-PCR (Flu A&B, Covid) Nasopharyngeal Swab     Status: None   Collection Time: 07/12/21 10:44 AM   Specimen: Nasopharyngeal Swab; Nasopharyngeal(NP) swabs in vial transport medium  Result Value Ref Range Status   SARS Coronavirus 2 by RT PCR NEGATIVE NEGATIVE Final    Comment: (NOTE) SARS-CoV-2 target nucleic acids are NOT DETECTED.  The SARS-CoV-2 RNA is generally detectable in upper respiratory specimens during the acute phase of infection. The lowest concentration of SARS-CoV-2 viral copies this assay can detect is 138 copies/mL. A negative result does not preclude SARS-Cov-2 infection and should not be used as the sole basis for treatment or other patient management decisions. A negative result may occur with  improper specimen collection/handling, submission of specimen other than nasopharyngeal swab, presence of viral mutation(s) within the areas targeted by this assay, and inadequate number of viral copies(<138 copies/mL). A negative  result must be combined with clinical observations, patient history, and epidemiological information. The expected result is Negative.  Fact Sheet for Patients:  EntrepreneurPulse.com.au  Fact Sheet for Healthcare Providers:  IncredibleEmployment.be  This test is no t yet approved or cleared by the [place] FDA and  has been authorized for detection and/or diagnosis of SARS-CoV-2 by FDA under an Emergency Use Authorization (EUA). This EUA will remain  in effect (meaning this test can be used) for the duration of the COVID-19 declaration under Section 564(b)(1) of the Act, 21 U.S.C.section 360bbb-3(b)(1), unless the authorization is terminated  or revoked sooner.       Influenza A by PCR NEGATIVE NEGATIVE Final   Influenza B by PCR NEGATIVE NEGATIVE Final    Comment: (NOTE) The Xpert Xpress SARS-CoV-2/FLU/RSV plus assay is intended as an aid in the diagnosis of influenza from Nasopharyngeal swab specimens and should not be used as a sole basis for treatment. Nasal washings and aspirates are unacceptable for Xpert Xpress SARS-CoV-2/FLU/RSV testing.  Fact Sheet for Patients: EntrepreneurPulse.com.au  Fact Sheet for Healthcare Providers: IncredibleEmployment.be  This test is not yet approved or cleared by the [place] FDA and has been authorized for detection and/or diagnosis of SARS-CoV-2 by FDA under an Emergency Use Authorization (EUA). This EUA will remain in effect (meaning this test can be used) for the duration of the COVID-19 declaration under Section 564(b)(1) of the Act, 21 U.S.C. section 360bbb-3(b)(1), unless the authorization is terminated or revoked.  Performed at [facility], [address] [place], [place] [address]   Blood culture (routine x 2)     Status: None   Collection Time: 07/12/21  1:15 PM   Specimen: BLOOD  Result Value Ref Range Status   Specimen  Description BLOOD RIGHT HAND  Final   Special Requests   Final    BOTTLES DRAWN AEROBIC AND ANAEROBIC Blood Culture adequate volume   Culture   Final    NO GROWTH 5 DAYS Performed at [facility], [address], [place], [place] [address]    Report Status 07/17/2021 FINAL  Final  CSF culture w Gram Stain     Status: Abnormal   Collection Time: 07/12/21  1:20 PM   Specimen: PATH Cytology CSF; Cerebrospinal Fluid  Result Value Ref Range Status   Specimen Description   Final    CSF TUBE 4 Performed at [facility], [address] [place], [place] [address]    Special Requests   Final    NONE Performed at [facility], [address] [place], [place] [address]    Gram Stain   Final    NO ORGANISMS SEEN WBC SEEN RED BLOOD CELLS RESULT CALLED TO, READ BACK BY AND VERIFIED WITH: [place] AT 1517 ON 07/12/21 BY  SS Performed at [facility], [address] [place], [place] [address]    Culture (A)  Final    ENTEROBACTER CLOACAE CRITICAL RESULT CALLED TO, READ BACK BY AND VERIFIED WITH: RN S.HINO AT 1158 ON 07/14/2021 BY T.SAAD. Performed at [facility], [place] [address] [place], [place] [address]    Report Status 07/15/2021 FINAL  Final   Organism ID, Bacteria ENTEROBACTER CLOACAE  Final      Susceptibility   Enterobacter cloacae - MIC*    CEFAZOLIN >=64 RESISTANT Resistant     CEFEPIME <=0.12 SENSITIVE Sensitive     CEFTAZIDIME <=1 SENSITIVE Sensitive     CIPROFLOXACIN <=0.25 SENSITIVE Sensitive     GENTAMICIN <=1 SENSITIVE Sensitive     IMIPENEM 0.5 SENSITIVE Sensitive     TRIMETH/SULFA <=20 SENSITIVE Sensitive     PIP/TAZO <=4 SENSITIVE Sensitive     * ENTEROBACTER CLOACAE  Culture, blood (Routine X 2) w Reflex to ID Panel     Status: None   Collection Time: 07/14/21  6:28 AM   Specimen: BLOOD  Result Value Ref Range Status   Specimen Description BLOOD RIGHT HAND  Final   Special Requests   Final    BOTTLES DRAWN AEROBIC  AND ANAEROBIC Blood Culture adequate volume   Culture   Final    NO GROWTH 5 DAYS Performed at [facility] [address] [place], [place] [address]    Report Status 07/19/2021 FINAL  Final      Radiology Studies: No results found.  Scheduled Meds:  amLODipine  7.5 mg Oral Daily   apixaban  5 mg Oral BID   Chlorhexidine Gluconate Cloth  6 each Topical Daily   citalopram  20 mg Oral Daily   levothyroxine  75 mcg Oral Daily   loratadine  10 mg Oral Daily   losartan  50 mg Oral Daily   pantoprazole  40 mg Oral Daily   senna  1 tablet Oral BID   sodium chloride flush  10-40 mL Intracatheter Q12H   triamterene-hydrochlorothiazide  1 tablet Oral Daily   Continuous Infusions:  ceFEPime (MAXIPIME) IV 200 mL/hr at 07/19/21 1348     LOS: 7 days   Time spent: 30 minutes. More than 50% of the time was spent in counseling/coordination of care  Lorella Nimrod, MD [facility]  If 7PM-7AM, please contact night-coverage [email or web address]  07/19/2021, 2:40 PM   This record has been created using Systems analyst. Errors have been sought and corrected,but may not always be located. Such creation errors do not reflect on the standard of care.

## 2021-07-20 DIAGNOSIS — G009 Bacterial meningitis, unspecified: Secondary | ICD-10-CM

## 2021-07-20 LAB — BASIC METABOLIC PANEL
Anion gap: 11 (ref 5–15)
BUN: 26 mg/dL — ABNORMAL HIGH (ref 8–23)
CO2: 22 mmol/L (ref 22–32)
Calcium: 10.4 mg/dL — ABNORMAL HIGH (ref 8.9–10.3)
Chloride: 100 mmol/L (ref 98–111)
Creatinine, Ser: 1.26 mg/dL — ABNORMAL HIGH (ref 0.44–1.00)
GFR, Estimated: 43 mL/min — ABNORMAL LOW (ref >60)
Glucose, Bld: 147 mg/dL — ABNORMAL HIGH (ref 70–99)
Potassium: 3.2 mmol/L — ABNORMAL LOW (ref 3.5–5.1)
Sodium: 133 mmol/L — ABNORMAL LOW (ref 135–145)

## 2021-07-20 LAB — CBC
HCT: 37.3 % (ref 36.0–46.0)
Hemoglobin: 13.2 g/dL (ref 12.0–15.0)
MCH: 32 pg (ref 26.0–34.0)
MCHC: 35.4 g/dL (ref 30.0–36.0)
MCV: 90.5 fL (ref 80.0–100.0)
Platelets: 312 10*3/uL (ref 150–400)
RBC: 4.12 MIL/uL (ref 3.87–5.11)
RDW: 12.5 % (ref 11.5–15.5)
WBC: 8.6 10*3/uL (ref 4.0–10.5)
nRBC: 0 % (ref 0.0–0.2)

## 2021-07-20 LAB — RESP PANEL BY RT-PCR (FLU A&B, COVID) ARPGX2
Influenza A by PCR: NEGATIVE
Influenza B by PCR: NEGATIVE
SARS Coronavirus 2 by RT PCR: NEGATIVE

## 2021-07-20 MED ORDER — POTASSIUM CHLORIDE CRYS ER 20 MEQ PO TBCR
40.0000 meq | EXTENDED_RELEASE_TABLET | Freq: Once | ORAL | Status: AC
  Administered 2021-07-20: 40 meq via ORAL
  Filled 2021-07-20: qty 2

## 2021-07-20 MED ORDER — SODIUM CHLORIDE 0.9 % IV SOLN
INTRAVENOUS | Status: DC | PRN
  Administered 2021-07-20: 250 mL via INTRAVENOUS

## 2021-07-20 NOTE — Progress Notes (Signed)
Physical Therapy Treatment Patient Details Name: Latoya Freeman MRN: [id number] DOB: 1941-09-03 Today's Date: 07/20/2021    History of Present Illness Pt is a 80 y.o. female with medical history significant for hypothyroidism, hypertension, GERD, status post recent L3-L4 discectomy and L4-L5 as well as L5-S1 decompression on 07/01/21 and was discharged home in stable condition.  She presents to the ER for evaluation of a headache which she described as the worst headache of her life.  MD assessment includes suspected bacterial meningitis, AKI, concern of delirium/mild underlying cognitive impairment, HTN, and general weakness.    PT Comments    Pt was pleasant and motivated to participate during the session and put forth good effort throughout.  Pt required occasional min A to help control speed of descent during stand to sit and required verbal cues for general safety with the walker during ambulation.  Pt reported no adverse symptoms during the session with SpO2 and HR WNL. Pt will benefit from PT services in a SNF setting upon discharge to safely address deficits listed in patient problem list for decreased caregiver assistance and eventual return to PLOF.     Follow Up Recommendations  SNF     Equipment Recommendations  None recommended by PT    Recommendations for Other Services       Precautions / Restrictions Precautions Precautions: Fall Restrictions Weight Bearing Restrictions: No    Mobility  Bed Mobility               General bed mobility comments: NT, pt in recliner    Transfers Overall transfer level: Needs assistance Equipment used: Rolling walker (2 wheeled) Transfers: Sit to/from Stand Sit to Stand: Min assist         General transfer comment: Min A for improved control during stand to sit with cues for sequencing  Ambulation/Gait Ambulation/Gait assistance: Min guard Gait Distance (Feet): 20 Feet x 1, 10 Feet x 1 Assistive device: Rolling  walker (2 wheeled) Gait Pattern/deviations: Step-through pattern Gait velocity: decreased   General Gait Details: Slow cadence with mod verbal cues for amb closer to the RW with upright posture and for proper hand positioning on the RW   Stairs             Wheelchair Mobility    Modified Rankin (Stroke Patients Only)       Balance Overall balance assessment: Needs assistance   Sitting balance-Leahy Scale: Good     Standing balance support: During functional activity;Bilateral upper extremity supported Standing balance-Leahy Scale: Fair                              Cognition Arousal/Alertness: Awake/alert Behavior During Therapy: WFL for tasks assessed/performed Overall Cognitive Status: Within Functional Limits for tasks assessed                                        Exercises Total Joint Exercises Ankle Circles/Pumps: AROM;Strengthening;Both;5 reps;10 reps Quad Sets: Strengthening;Both;5 reps;10 reps Gluteal Sets: Strengthening;Both;5 reps;10 reps Long Arc Quad: AROM;Strengthening;Both;10 reps;15 reps Knee Flexion: 15 reps;10 reps;Both;Strengthening;AROM Marching in Standing: AROM;Strengthening;Both;5 reps;10 reps;Standing Other Exercises Other Exercises: HEP education for BLE APs, QS, and GS x 10 each 4-5x/day    General Comments        Pertinent Vitals/Pain Pain Assessment: No/denies pain    Home Living  Prior Function            PT Goals (current goals can now be found in the care plan section) Progress towards PT goals: Progressing toward goals    Frequency    Min 2X/week      PT Plan Current plan remains appropriate    Co-evaluation              AM-PAC PT "6 Clicks" Mobility   Outcome Measure  Help needed turning from your back to your side while in a flat bed without using bedrails?: A Little Help needed moving from lying on your back to sitting on the side of a  flat bed without using bedrails?: A Little Help needed moving to and from a bed to a chair (including a wheelchair)?: A Little Help needed standing up from a chair using your arms (e.g., wheelchair or bedside chair)?: A Little Help needed to walk in hospital room?: A Little Help needed climbing 3-5 steps with a railing? : A Lot 6 Click Score: 17    End of Session Equipment Utilized During Treatment: Gait belt Activity Tolerance: Patient tolerated treatment well Patient left: in chair;with chair alarm set;with call bell/phone within reach Nurse Communication: Mobility status PT Visit Diagnosis: Unsteadiness on feet (R26.81);Muscle weakness (generalized) (M62.81)     Time: 3557-3220 PT Time Calculation (min) (ACUTE ONLY): 31 min  Charges:  $Gait Training: 8-22 mins $Therapeutic Exercise: 8-22 mins                     D. Scott Zyden Suman PT, DPT 07/20/21, 11:55 AM

## 2021-07-20 NOTE — Discharge Summary (Addendum)
Physician Discharge Summary  Latoya Freeman GEX:[id number] DOB: 06/19/1941 DOA: 07/12/2021  PCP: [name], MD  Admit date: 07/12/2021 Discharge date: 07/21/2021  Admitted From: Home Disposition: SNF  Recommendations for Outpatient Follow-up:  Follow up with PCP in 1-2 weeks Follow-up with infectious disease Follow-up with neurosurgery Please obtain BMP/CBC in one week Please follow up on the following pending results: None Please encourage p.o. hydration  Home Health: No Equipment/Devices: Rolling walker Discharge Condition: Stable CODE STATUS: DNR Diet recommendation: Heart Healthy   Brief/Interim Summary: 74 y f with k/h/o hypothyroidism, HTN, GERD, s/p recent L3-L4 & L4-L5 discectomy and L5-S1 decompression on 07/01/21 admitted for worst headache of life and altered mental status.  CSF c/s bacterial meningitis, growing Enterobacter cloacae, nosocomial infection per ID.  Infectious disease de-escalated antibiotics to cefepime according to susceptibility results.  Initially received meropenem and vancomycin. Will need 3 weeks of antibiotics, stop date is 08/02/2021.  Patient has a dural breach during her procedure.  Neurosurgery was also consulted but they did not noted any sign of surgical site. PICC line placed and patient is being discharged to SNF for rehab and to complete the antibiotics. Headache and mental status improved and now at baseline.  Patient has an history of PE.  We held Eliquis for few days and it was resumed after having a normal CTA of head as there was an history of prior ICA aneurysm repaired.  Patient did develop some AKI with CKD stage IIIa.  Creatinine at baseline before discharge.  Patient had some concern of delirium and mild underlying cognitive impairment.  Mental status improved and appears at baseline before discharge.  Patient was having some right plantar pain on standing, resolved spontaneously.  She was asked to do some stretching exercises and  needs to see a podiatrist if the pain continues or get worse.  She was also experiencing some transient lightheadedness on standing.  Orthostatic vitals were checked and they were within normal limit.  She needs to be encouraged for p.o. hydration.  Patient will continue her home antihypertensives and follow-up with her providers.  Discharge Diagnoses:  Principal Problem:   Acute bacterial meningitis Active Problems:   GERD (gastroesophageal reflux disease)   Thyroid disease   History of pulmonary embolism   Hypokalemia   Obesity (BMI 30-39.9)   Benign hypertension with CKD (chronic kidney disease) stage III (HCC)   Headache   Dehydration   Leukocytosis   Discharge Instructions  Discharge Instructions     [facility] pharmacist to adjust dose for Vancomycin, Aminoglycosides and other anti-infective therapies as requested by physician.   Complete by: As directed    [facility] to provide Cath Flo 581m   Complete by: As directed    Administer for PICC line occlusion and as ordered by physician for other access device issues.   Anaphylaxis Kit: Provided to treat any anaphylactic reaction to the medication being provided to the patient if First Dose or when requested by physician   Complete by: As directed    Epinephrine 167mml vial / amp: Administer 0.81m75m0.81ml32mubcutaneously once for moderate to severe anaphylaxis, nurse to call physician and pharmacy when reaction occurs and call 911 if needed for immediate care   Diphenhydramine 50mg68mIV vial: Administer 25-50mg 32mM PRN for first dose reaction, rash, itching, mild reaction, nurse to call physician and pharmacy when reaction occurs   Sodium Chloride 0.9% NS 500ml I28mdminister if needed for hypovolemic blood pressure drop or as ordered by  physician after call to physician with anaphylactic reaction   Change dressing on IV access line weekly and PRN   Complete by: As directed    Diet - low sodium heart  healthy   Complete by: As directed    Flush IV access with Sodium Chloride 0.9% and Heparin 10 units/ml or 100 units/ml   Complete by: As directed    Home infusion instructions - [facility]   Complete by: As directed    Instructions: Flush IV access with Sodium Chloride 0.9% and Heparin 10units/ml or 100units/ml   Change dressing on IV access line: Weekly and PRN   Instructions Cath Flo 2m: Administer for PICC Line occlusion and as ordered by physician for other access device   [facility] pharmacist to adjust dose for: Vancomycin, Aminoglycosides and other anti-infective therapies as requested by physician   Increase activity slowly   Complete by: As directed    Method of administration may be changed at the discretion of home infusion pharmacist based upon assessment of the patient and/or caregiver's ability to self-administer the medication ordered   Complete by: As directed    No dressing needed   Complete by: As directed       Allergies as of 07/21/2021       Reactions   Bee Venom Swelling   Shellfish Allergy Swelling   Tamoxifen Hives   Penicillins Hives, Rash, Other (See Comments)   Has patient had a PCN reaction causing immediate rash, facial/tongue/throat swelling, SOB or lightheadedness with hypotension: Unknown Has patient had a PCN reaction causing severe rash involving mucus membranes or skin necrosis: Unknown Has patient had a PCN reaction that required hospitalization: Unknown Has patient had a PCN reaction occurring within the last 10 years: No If all of the above answers are "NO", then may proceed with Cephalosporin use.        Medication List     STOP taking these medications    celecoxib 200 MG capsule Commonly known as: CELEBREX   potassium chloride 10 MEQ tablet Commonly known as: KLOR-CON   traMADol 50 MG tablet Commonly known as: ULTRAM       TAKE these medications    acetaminophen 325 MG tablet Commonly known as:  TYLENOL Take 2 tablets (650 mg total) by mouth every 6 (six) hours as needed for mild pain, moderate pain, fever or headache (or Fever >/= 101).   amLODipine 5 MG tablet Commonly known as: NORVASC Take 7.5 mg by mouth daily.   apixaban 5 MG Tabs tablet Commonly known as: ELIQUIS Take 5 mg by mouth 2 (two) times daily.   bisacodyl 5 MG EC tablet Commonly known as: DULCOLAX Take 1 tablet (5 mg total) by mouth daily as needed for moderate constipation.   ceFEPime  IVPB Commonly known as: MAXIPIME Inject 2 g into the vein every 12 (twelve) hours for 16 days. Indication:  Enterobacter cloacae meningitis First Dose: Yes Adjust cefepime to q8hr for CrCl > 60 Last Day of Therapy:  08/02/2021 Labs weekly  Monday while on IV antibiotics: CBC with differential, CMP Labs every Thursday while on antibiotics: BMP Please pull PIC at completion of IV antibiotics Fax weekly labs promptly to Dr.Ravishankar  to [phone] of administration: IV Push Method of administration may be changed at the discretion of home infusion pharmacist based upon assessment of the patient and/or caregiver's ability to self-administer the medication ordered.   cetirizine 10 MG tablet Commonly known as: ZYRTEC Take 10 mg  by mouth daily as needed for allergies.   citalopram 20 MG tablet Commonly known as: CELEXA Take 20 mg by mouth daily.   clopidogrel 75 MG tablet Commonly known as: PLAVIX Take 75 mg by mouth daily.   HYDROcodone-acetaminophen 5-325 MG tablet Commonly known as: NORCO/VICODIN Take 1 tablet by mouth every 6 (six) hours as needed for moderate pain or severe pain.   levothyroxine 75 MCG tablet Commonly known as: SYNTHROID Take 75 mcg by mouth daily.   losartan 50 MG tablet Commonly known as: COZAAR Take 50 mg by mouth daily.   omeprazole 20 MG capsule Commonly known as: PRILOSEC Take 20 mg by mouth 2 (two) times daily.   senna 8.6 MG Tabs tablet Commonly known as: SENOKOT Take  1 tablet (8.6 mg total) by mouth 2 (two) times daily.   triamterene-hydrochlorothiazide 37.5-25 MG tablet Commonly known as: MAXZIDE-25 Take 1 tablet by mouth daily.               Discharge Care Instructions  (From admission, onward)           Start     Ordered   07/21/21 0000  Change dressing on IV access line weekly and PRN  (Home infusion instructions - [facility] )        07/21/21 1331   07/21/21 0000  No dressing needed        07/21/21 1331            Contact information for follow-up providers     [name], MD. Schedule an appointment as soon as possible for a visit in 1 week(s).   Specialty: Family Medicine Contact information: [place] [address] [phone]              Contact information for after-discharge care     Destination     [facility] .   Service: Skilled Nursing Contact information: [place] [address] [phone]                    Allergies  Allergen Reactions   Bee Venom Swelling   Shellfish Allergy Swelling   Tamoxifen Hives   Penicillins Hives, Rash and Other (See Comments)    Has patient had a PCN reaction causing immediate rash, facial/tongue/throat swelling, SOB or lightheadedness with hypotension: Unknown Has patient had a PCN reaction causing severe rash involving mucus membranes or skin necrosis: Unknown Has patient had a PCN reaction that required hospitalization: Unknown Has patient had a PCN reaction occurring within the last 10 years: No If all of the above answers are "NO", then may proceed with Cephalosporin use.     Consultations: Neurosurgery Infectious disease  Procedures/Studies: CT ANGIO HEAD W OR WO CONTRAST  Result Date: 07/13/2021 CLINICAL DATA:  Initial evaluation for dizziness with headache status post spinal fusion. EXAM: CT ANGIOGRAPHY HEAD TECHNIQUE: Multidetector CT imaging of the  head was performed using the standard protocol during bolus administration of intravenous contrast. Multiplanar CT image reconstructions and MIPs were obtained to evaluate the vascular anatomy. CONTRAST:  82m OMNIPAQUE IOHEXOL 350 MG/ML SOLN COMPARISON:  Prior CT from 07/12/2021. FINDINGS: CT HEAD Brain: Generalized age-related cerebral atrophy with moderate chronic microvascular ischemic disease. Postoperative changes from previous left pterional craniotomy for surgical clipping of a left ICA aneurysm. Associated postoperative encephalomalacia seen within the anterior left temporal region. No acute intracranial hemorrhage. No acute large vessel territory infarct. No mass lesion, mass  effect or midline shift. No hydrocephalus or extra-axial fluid collection. Vascular: Streak artifact from aneurysm clips in the region of the cavernous left ICA. No visible hyperdense vessel. Scattered calcified atherosclerosis present at the skull base. Skull: Postoperative changes from prior left pterional craniotomy. Calvarium otherwise intact. No acute scalp soft tissue abnormality. Sinuses: Tiny right sphenoid sinus retention cyst. Paranasal sinuses are otherwise clear. No mastoid effusion. Other: Globes and orbital soft tissues demonstrate no acute finding. CTA HEAD Anterior circulation: Visualized distal cervical internal carotid arteries widely patent bilaterally. Petrous segments widely patent. Scattered calcified atheromatous plaque within the cavernous/supraclinoid ICAs without visible high-grade stenosis. Sequelae of prior surgical clipping of aneurysm at the supraclinoid left ICA. No visible residual aneurysm evident by CTA, although evaluation somewhat limited by streak artifact. A1 segments patent bilaterally. Normal anterior communicating artery complex. Anterior cerebral arteries patent to their distal aspects without stenosis. No M1 stenosis or occlusion. Normal MCA bifurcations. Distal MCA branches well perfused and  symmetric. Posterior circulation: Vertebral arteries patent to the vertebrobasilar junction without stenosis. Left vertebral artery dominant. Both PICA origins patent and normal. Basilar patent to its distal aspect without stenosis. Superior cerebellar arteries patent bilaterally. Both PCAs primarily supplied via the basilar well perfused to their distal aspects. Venous sinuses: Grossly patent allowing for timing the contrast bolus. Anatomic variants: None significant. No other aneurysm seen elsewhere about the intracranial circulation. Review of the MIP images confirms the above findings. IMPRESSION: 1. Postoperative changes from previous left pterional craniotomy for surgical clipping of a left ICA aneurysm. No visible residual aneurysm evident by CTA. 2. Mild for age intracranial atherosclerotic disease with no large vessel occlusion, hemodynamically significant stenosis, or other acute vascular abnormality. 3. Age-related cerebral atrophy with moderate chronic microvascular ischemic disease. No other acute intracranial abnormality. Electronically Signed   By: Jeannine Boga M.D.   On: 07/13/2021 22:39   DG Chest 2 View  Result Date: 07/12/2021 CLINICAL DATA:  Headache. Shortness of breath. Patient off her blood thinners for 3 weeks. Had recent back surgery. EXAM: CHEST - 2 VIEW COMPARISON:  06/08/2020 FINDINGS: Cardiac silhouette is normal in size. No mediastinal or hilar masses or evidence adenopathy. Linear opacities noted in the right middle lobe consistent with atelectasis or scarring. Lungs otherwise clear. Lung volumes are low. No pleural effusion or pneumothorax. Skeletal structures are demineralized, but intact. IMPRESSION: No acute cardiopulmonary disease. Electronically Signed   By: Lajean Manes M.D.   On: 07/12/2021 12:05   DG Lumbar Spine 2-3 Views  Result Date: 07/01/2021 CLINICAL DATA:  Lumbar surgery. L3-4 microdiscectomy, L4-S1 decompression EXAM: LUMBAR SPINE - 2-3 VIEW; DG C-ARM  1-60 MIN COMPARISON:  05/22/2021 FINDINGS: 7 C-arm fluoroscopic images were obtained intraoperatively and submitted for post operative interpretation. Surgical instrumentation seen posterior to the L3-4, L4-5, and L5-S1 levels. 8 seconds of fluoroscopy time was utilized. Please see the performing provider's procedural report for further detail. IMPRESSION: As above. Electronically Signed   By: Davina Poke D.O.   On: 07/01/2021 11:35   CT Head Wo Contrast  Result Date: 07/12/2021 CLINICAL DATA:  Headache. History of ruptured aneurysm. Recent back surgery. EXAM: CT HEAD WITHOUT CONTRAST TECHNIQUE: Contiguous axial images were obtained from the base of the skull through the vertex without intravenous contrast. COMPARISON:  None. FINDINGS: Brain: Expected cerebral volume loss for age. Mild low density in the periventricular white matter likely related to small vessel disease. Given limitations of beam hardening artifact from aneurysm clips, no mass lesion, hemorrhage, hydrocephalus, acute infarct,  intra-axial, or extra-axial fluid collection. Vascular: Intracranial atherosclerosis. Skull: Left frontotemporal craniotomy for aneurysm clipping. Sinuses/Orbits: Normal imaged portions of the orbits and globes. Clear paranasal sinuses and mastoid air cells. Other: None. IMPRESSION: 1. Status post craniotomy for aneurysm clipping. Given beam hardening artifact from aneurysm clips, no acute intracranial abnormality. 2.  Cerebral atrophy and small vessel ischemic change. Electronically Signed   By: Abigail Miyamoto M.D.   On: 07/12/2021 06:19   CT Lumbar Spine Wo Contrast  Result Date: 07/12/2021 CLINICAL DATA:  Spinal fusion, lumbosacral, follow-up; post LP EXAM: CT LUMBAR SPINE WITHOUT CONTRAST TECHNIQUE: Multidetector CT imaging of the lumbar spine was performed without intravenous contrast administration. Multiplanar CT image reconstructions were also generated. COMPARISON:  05/22/2021 FINDINGS: Segmentation: 5  lumbar type vertebrae. Alignment: Stable grade 1 anterolisthesis at L4-L5 and L5-S1. Vertebrae: Stable vertebral body heights. There are new postoperative changes of left laminectomies at L3-L4, L4-L5, and L5-S1. Paraspinal and other soft tissues: Postoperative changes in the dorsal paraspinal soft tissues. Ill-defined density within the laminectomy beds. Few foci of air including within the left laminectomy bed at L3-L4 may be related to recent lumbar puncture. Aortic atherosclerosis. Disc levels: L1-L2: Disc bulge eccentric to the right. No significant canal or foraminal stenosis. L2-L3:  No significant canal or foraminal stenosis. L3-L4: Operative level. Disc bulge and facet hypertrophy. Canal not well evaluated but stenosis appears decreased. No significant right foraminal stenosis. Similar mild left foraminal stenosis. L4-L5: Operative level. Disc bulge and facet hypertrophy. Canal not well evaluated but stenosis appears decreased. No significant right foraminal stenosis. Similar mild left foraminal stenosis. L5-S1: Operative level. Disc bulge and facet hypertrophy. Canal is not well evaluated but does not appear to be significant stenosis. Similar mild to moderate foraminal stenosis. IMPRESSION: Postoperative changes at L3-L4, L4-L5, and L5-S1. Nonspecific density in the laminectomy beds may reflect fluid and granulation tissue. Infection is not well evaluated. The canal is not well evaluated at the operative levels but stenosis does appear decreased. Electronically Signed   By: Macy Mis M.D.   On: 07/12/2021 18:12   DG C-Arm 1-60 Min  Result Date: 07/01/2021 CLINICAL DATA:  Lumbar surgery. L3-4 microdiscectomy, L4-S1 decompression EXAM: LUMBAR SPINE - 2-3 VIEW; DG C-ARM 1-60 MIN COMPARISON:  05/22/2021 FINDINGS: 7 C-arm fluoroscopic images were obtained intraoperatively and submitted for post operative interpretation. Surgical instrumentation seen posterior to the L3-4, L4-5, and L5-S1 levels. 8  seconds of fluoroscopy time was utilized. Please see the performing provider's procedural report for further detail. IMPRESSION: As above. Electronically Signed   By: Davina Poke D.O.   On: 07/01/2021 11:35   DG C-Arm 1-60 Min  Result Date: 07/01/2021 CLINICAL DATA:  Lumbar surgery. L3-4 microdiscectomy, L4-S1 decompression EXAM: LUMBAR SPINE - 2-3 VIEW; DG C-ARM 1-60 MIN COMPARISON:  05/22/2021 FINDINGS: 7 C-arm fluoroscopic images were obtained intraoperatively and submitted for post operative interpretation. Surgical instrumentation seen posterior to the L3-4, L4-5, and L5-S1 levels. 8 seconds of fluoroscopy time was utilized. Please see the performing provider's procedural report for further detail. IMPRESSION: As above. Electronically Signed   By: Davina Poke D.O.   On: 07/01/2021 11:35   [place] EKG SITE RITE  Result Date: 07/16/2021 If [facility] image not attached, placement could not be confirmed due to current cardiac rhythm.  DG Lumbar Puncture Fluoro Guide  Result Date: 07/12/2021 CLINICAL DATA:  Headaches and elevated white count. Patient had lumbar surgery on 07/01/2021. EXAM: DIAGNOSTIC LUMBAR PUNCTURE UNDER FLUOROSCOPIC GUIDANCE COMPARISON:  Prior studies FLUOROSCOPY  TIME:  Fluoroscopy Time:  1 minutes 6 second Number of Acquired Spot Images: 3 PROCEDURE: Informed consent was obtained from the patient's daughter, Jeannene Patella prior to the procedure, including potential complications of headache, allergy, and pain. With the patient prone, the lower back was prepped with Betadine. 1% Lidocaine was used for local anesthesia. Lumbar puncture was performed at the L3-4 level using a 21 gauge needle with return of bloody, cloudy, yellowish CSF with an opening pressure of 7 cm water. During collection, the fluid cleared and was cloudy-yellowish . Eight ml of CSF were obtained for laboratory studies. The patient tolerated the procedure well and there were no apparent complications. IMPRESSION: Status  post fluoroscopic guided lumbar puncture at L3-4. No apparent complications. Electronically Signed   By: Nolon Nations M.D.   On: 07/12/2021 15:14    Subjective: Patient was seen and examined today.  She was having some right plantar pain when standing up.  We discussed about doing some stretching exercises and follow-up with podiatry if the pain continues. He was also having some transient dizziness on standing up, orthostatic vitals checked and they were within normal limit.  Discharge Exam: Vitals:   07/21/21 0415 07/21/21 0736  BP: (!) 142/91 122/62  Pulse: 61 62  Resp: 16   Temp: (!) 97.3 F (36.3 C) 98.2 F (36.8 C)  SpO2: 95% 96%   Vitals:   07/20/21 0727 07/20/21 1948 07/21/21 0415 07/21/21 0736  BP: 131/65 117/71 (!) 142/91 122/62  Pulse: 65 64 61 62  Resp: _0 Temp: 98.5 F (36.9 C) (!) 97.5 F (36.4 C) (!) 97.3 F (36.3 C) 98.2 F (36.8 C)  TempSrc: Oral Oral Oral Oral  SpO2: 92% 97% 95% 96%  Weight:      Height:        General: Pt is alert, awake, not in acute distress Cardiovascular: RRR, S1/S2 +, no rubs, no gallops Respiratory: CTA bilaterally, no wheezing, no rhonchi Abdominal: Soft, NT, ND, bowel sounds + Extremities: no edema, no cyanosis   The results of significant diagnostics from this hospitalization (including imaging, microbiology, ancillary and laboratory) are listed below for reference.    Microbiology: Recent Results (from the past 240 hour(s))  Resp Panel by RT-PCR (Flu A&B, Covid) Nasopharyngeal Swab     Status: None   Collection Time: 07/12/21 10:44 AM   Specimen: Nasopharyngeal Swab; Nasopharyngeal(NP) swabs in vial transport medium  Result Value Ref Range Status   SARS Coronavirus 2 by RT PCR NEGATIVE NEGATIVE Final    Comment: (NOTE) SARS-CoV-2 target nucleic acids are NOT DETECTED.  The SARS-CoV-2 RNA is generally detectable in upper respiratory specimens during the acute phase of infection. The lowest concentration of  SARS-CoV-2 viral copies this assay can detect is 138 copies/mL. A negative result does not preclude SARS-Cov-2 infection and should not be used as the sole basis for treatment or other patient management decisions. A negative result may occur with  improper specimen collection/handling, submission of specimen other than nasopharyngeal swab, presence of viral mutation(s) within the areas targeted by this assay, and inadequate number of viral copies(<138 copies/mL). A negative result must be combined with clinical observations, patient history, and epidemiological information. The expected result is Negative.  Fact Sheet for Patients:  EntrepreneurPulse.com.au  Fact Sheet for Healthcare Providers:  IncredibleEmployment.be  This test is no t yet approved or cleared by the [place] FDA and  has been authorized for detection and/or diagnosis of SARS-CoV-2 by FDA under an Emergency  Use Authorization (EUA). This EUA will remain  in effect (meaning this test can be used) for the duration of the COVID-19 declaration under Section 564(b)(1) of the Act, 21 U.S.C.section 360bbb-3(b)(1), unless the authorization is terminated  or revoked sooner.       Influenza A by PCR NEGATIVE NEGATIVE Final   Influenza B by PCR NEGATIVE NEGATIVE Final    Comment: (NOTE) The Xpert Xpress SARS-CoV-2/FLU/RSV plus assay is intended as an aid in the diagnosis of influenza from Nasopharyngeal swab specimens and should not be used as a sole basis for treatment. Nasal washings and aspirates are unacceptable for Xpert Xpress SARS-CoV-2/FLU/RSV testing.  Fact Sheet for Patients: EntrepreneurPulse.com.au  Fact Sheet for Healthcare Providers: IncredibleEmployment.be  This test is not yet approved or cleared by the [place] FDA and has been authorized for detection and/or diagnosis of SARS-CoV-2 by FDA under an Emergency Use  Authorization (EUA). This EUA will remain in effect (meaning this test can be used) for the duration of the COVID-19 declaration under Section 564(b)(1) of the Act, 21 U.S.C. section 360bbb-3(b)(1), unless the authorization is terminated or revoked.  Performed at [facility], [address] [place], [place] [address]   Blood culture (routine x 2)     Status: None   Collection Time: 07/12/21  1:15 PM   Specimen: BLOOD  Result Value Ref Range Status   Specimen Description BLOOD RIGHT HAND  Final   Special Requests   Final    BOTTLES DRAWN AEROBIC AND ANAEROBIC Blood Culture adequate volume   Culture   Final    NO GROWTH 5 DAYS Performed at [facility], [address] [place], [place] [address]    Report Status 07/17/2021 FINAL  Final  CSF culture w Gram Stain     Status: Abnormal   Collection Time: 07/12/21  1:20 PM   Specimen: PATH Cytology CSF; Cerebrospinal Fluid  Result Value Ref Range Status   Specimen Description   Final    CSF TUBE 4 Performed at [facility], [address], [place], [place] [place] [id number]    Special Requests   Final    NONE Performed at [facility], [address] [place], [place] [address]    Gram Stain   Final    NO ORGANISMS SEEN WBC SEEN RED BLOOD CELLS RESULT CALLED TO, READ BACK BY AND VERIFIED WITH: [place] AT 5537 ON 07/12/21 BY SS Performed at [facility], [address] [place], [place] [address]    Culture (A)  Final    ENTEROBACTER CLOACAE CRITICAL RESULT CALLED TO, READ BACK BY AND VERIFIED WITH: RN S.HINO AT 1158 ON 07/14/2021 BY T.SAAD. Performed at [facility], [place] [address] [place], [place] [address]    Report Status 07/15/2021 FINAL  Final   Organism ID, Bacteria ENTEROBACTER CLOACAE  Final      Susceptibility   Enterobacter cloacae - MIC*    CEFAZOLIN >=64 RESISTANT Resistant     CEFEPIME <=0.12 SENSITIVE Sensitive     CEFTAZIDIME <=1 SENSITIVE Sensitive      CIPROFLOXACIN <=0.25 SENSITIVE Sensitive     GENTAMICIN <=1 SENSITIVE Sensitive     IMIPENEM 0.5 SENSITIVE Sensitive     TRIMETH/SULFA <=20 SENSITIVE Sensitive     PIP/TAZO <=4 SENSITIVE Sensitive     * ENTEROBACTER CLOACAE  Culture, blood (Routine X 2) w Reflex to ID Panel     Status: None   Collection Time: 07/14/21  6:28 AM  Specimen: BLOOD  Result Value Ref Range Status   Specimen Description BLOOD RIGHT HAND  Final   Special Requests   Final    BOTTLES DRAWN AEROBIC AND ANAEROBIC Blood Culture adequate volume   Culture   Final    NO GROWTH 5 DAYS Performed at [facility], [address] [place], [place] [address]    Report Status 07/19/2021 FINAL  Final  Resp Panel by RT-PCR (Flu A&B, Covid) Nasopharyngeal Swab     Status: None   Collection Time: 07/20/21  9:27 AM   Specimen: Nasopharyngeal Swab; Nasopharyngeal(NP) swabs in vial transport medium  Result Value Ref Range Status   SARS Coronavirus 2 by RT PCR NEGATIVE NEGATIVE Final    Comment: (NOTE) SARS-CoV-2 target nucleic acids are NOT DETECTED.  The SARS-CoV-2 RNA is generally detectable in upper respiratory specimens during the acute phase of infection. The lowest concentration of SARS-CoV-2 viral copies this assay can detect is 138 copies/mL. A negative result does not preclude SARS-Cov-2 infection and should not be used as the sole basis for treatment or other patient management decisions. A negative result may occur with  improper specimen collection/handling, submission of specimen other than nasopharyngeal swab, presence of viral mutation(s) within the areas targeted by this assay, and inadequate number of viral copies(<138 copies/mL). A negative result must be combined with clinical observations, patient history, and epidemiological information. The expected result is Negative.  Fact Sheet for Patients:  EntrepreneurPulse.com.au  Fact Sheet for Healthcare Providers:   IncredibleEmployment.be  This test is no t yet approved or cleared by the [place] FDA and  has been authorized for detection and/or diagnosis of SARS-CoV-2 by FDA under an Emergency Use Authorization (EUA). This EUA will remain  in effect (meaning this test can be used) for the duration of the COVID-19 declaration under Section 564(b)(1) of the Act, 21 U.S.C.section 360bbb-3(b)(1), unless the authorization is terminated  or revoked sooner.       Influenza A by PCR NEGATIVE NEGATIVE Final   Influenza B by PCR NEGATIVE NEGATIVE Final    Comment: (NOTE) The Xpert Xpress SARS-CoV-2/FLU/RSV plus assay is intended as an aid in the diagnosis of influenza from Nasopharyngeal swab specimens and should not be used as a sole basis for treatment. Nasal washings and aspirates are unacceptable for Xpert Xpress SARS-CoV-2/FLU/RSV testing.  Fact Sheet for Patients: EntrepreneurPulse.com.au  Fact Sheet for Healthcare Providers: IncredibleEmployment.be  This test is not yet approved or cleared by the [place] FDA and has been authorized for detection and/or diagnosis of SARS-CoV-2 by FDA under an Emergency Use Authorization (EUA). This EUA will remain in effect (meaning this test can be used) for the duration of the COVID-19 declaration under Section 564(b)(1) of the Act, 21 U.S.C. section 360bbb-3(b)(1), unless the authorization is terminated or revoked.  Performed at [facility], [address] [place], [place] [address]      Labs: BNP (last 3 results) No results for input(s): BNP in the last 8760 hours. Basic Metabolic Panel: Recent Labs  Lab 07/15/21 0408 07/20/21 0927 07/21/21 0516  NA 140 133* 134*  K 3.6 3.2* 3.9  CL 107 100 100  CO2 _0 GLUCOSE 88 147* 97  BUN 16 26* 35*  CREATININE 1.02* 1.26* 1.23*  CALCIUM 9.7 10.4* 10.5*  MG  --   --  2.6*   Liver Function Tests: No results for  input(s): AST, ALT, ALKPHOS, BILITOT, PROT, ALBUMIN in the last 168 hours. No results for  input(s): LIPASE, AMYLASE in the last 168 hours. No results for input(s): AMMONIA in the last 168 hours. CBC: Recent Labs  Lab 07/15/21 0408 07/20/21 0927  WBC 14.0* 8.6  HGB 11.0* 13.2  HCT 31.7* 37.3  MCV 94.6 90.5  PLT 223 312   Cardiac Enzymes: No results for input(s): CKTOTAL, CKMB, CKMBINDEX, TROPONINI in the last 168 hours. BNP: Invalid input(s): POCBNP CBG: No results for input(s): GLUCAP in the last 168 hours. D-Dimer No results for input(s): DDIMER in the last 72 hours. Hgb A1c No results for input(s): HGBA1C in the last 72 hours. Lipid Profile No results for input(s): CHOL, HDL, LDLCALC, TRIG, CHOLHDL, LDLDIRECT in the last 72 hours. Thyroid function studies No results for input(s): TSH, T4TOTAL, T3FREE, THYROIDAB in the last 72 hours.  Invalid input(s): FREET3 Anemia work up No results for input(s): VITAMINB12, FOLATE, FERRITIN, TIBC, IRON, RETICCTPCT in the last 72 hours. Urinalysis    Component Value Date/Time   COLORURINE YELLOW 07/13/2021 0159   APPEARANCEUR CLEAR 07/13/2021 0159   LABSPEC 1.025 07/13/2021 0159   PHURINE 5.0 07/13/2021 0159   GLUCOSEU NEGATIVE 07/13/2021 0159   HGBUR NEGATIVE 07/13/2021 0159   BILIRUBINUR NEGATIVE 07/13/2021 0159   BILIRUBINUR neg 03/20/2019 1148   KETONESUR 15 (A) 07/13/2021 0159   PROTEINUR NEGATIVE 07/13/2021 0159   UROBILINOGEN negative (A) 03/20/2019 1148   NITRITE NEGATIVE 07/13/2021 0159   LEUKOCYTESUR NEGATIVE 07/13/2021 0159   Sepsis Labs Invalid input(s): PROCALCITONIN,  WBC,  LACTICIDVEN Microbiology Recent Results (from the past 240 hour(s))  Resp Panel by RT-PCR (Flu A&B, Covid) Nasopharyngeal Swab     Status: None   Collection Time: 07/12/21 10:44 AM   Specimen: Nasopharyngeal Swab; Nasopharyngeal(NP) swabs in vial transport medium  Result Value Ref Range Status   SARS Coronavirus 2 by RT PCR NEGATIVE  NEGATIVE Final    Comment: (NOTE) SARS-CoV-2 target nucleic acids are NOT DETECTED.  The SARS-CoV-2 RNA is generally detectable in upper respiratory specimens during the acute phase of infection. The lowest concentration of SARS-CoV-2 viral copies this assay can detect is 138 copies/mL. A negative result does not preclude SARS-Cov-2 infection and should not be used as the sole basis for treatment or other patient management decisions. A negative result may occur with  improper specimen collection/handling, submission of specimen other than nasopharyngeal swab, presence of viral mutation(s) within the areas targeted by this assay, and inadequate number of viral copies(<138 copies/mL). A negative result must be combined with clinical observations, patient history, and epidemiological information. The expected result is Negative.  Fact Sheet for Patients:  EntrepreneurPulse.com.au  Fact Sheet for Healthcare Providers:  IncredibleEmployment.be  This test is no t yet approved or cleared by the [place] FDA and  has been authorized for detection and/or diagnosis of SARS-CoV-2 by FDA under an Emergency Use Authorization (EUA). This EUA will remain  in effect (meaning this test can be used) for the duration of the COVID-19 declaration under Section 564(b)(1) of the Act, 21 U.S.C.section 360bbb-3(b)(1), unless the authorization is terminated  or revoked sooner.       Influenza A by PCR NEGATIVE NEGATIVE Final   Influenza B by PCR NEGATIVE NEGATIVE Final    Comment: (NOTE) The Xpert Xpress SARS-CoV-2/FLU/RSV plus assay is intended as an aid in the diagnosis of influenza from Nasopharyngeal swab specimens and should not be used as a sole basis for treatment. Nasal washings and aspirates are unacceptable for Xpert Xpress SARS-CoV-2/FLU/RSV testing.  Fact Sheet for Patients: EntrepreneurPulse.com.au  Fact  Sheet for Healthcare  Providers: IncredibleEmployment.be  This test is not yet approved or cleared by the [place] and has been authorized for detection and/or diagnosis of SARS-CoV-2 by FDA under an Emergency Use Authorization (EUA). This EUA will remain in effect (meaning this test can be used) for the duration of the COVID-19 declaration under Section 564(b)(1) of the Act, 21 U.S.C. section 360bbb-3(b)(1), unless the authorization is terminated or revoked.  Performed at [facility], [address] [place], [place] [address]   Blood culture (routine x 2)     Status: None   Collection Time: 07/12/21  1:15 PM   Specimen: BLOOD  Result Value Ref Range Status   Specimen Description BLOOD RIGHT HAND  Final   Special Requests   Final    BOTTLES DRAWN AEROBIC AND ANAEROBIC Blood Culture adequate volume   Culture   Final    NO GROWTH 5 DAYS Performed at [facility], [address] [place], [place] [address]    Report Status 07/17/2021 FINAL  Final  CSF culture w Gram Stain     Status: Abnormal   Collection Time: 07/12/21  1:20 PM   Specimen: PATH Cytology CSF; Cerebrospinal Fluid  Result Value Ref Range Status   Specimen Description   Final    CSF TUBE 4 Performed at [facility], [address], [place], [place] [address]    Special Requests   Final    NONE Performed at [facility], [address] [place], [place] [address]    Gram Stain   Final    NO ORGANISMS SEEN WBC SEEN RED BLOOD CELLS RESULT CALLED TO, READ BACK BY AND VERIFIED WITH: [place] AT 1694 ON 07/12/21 BY SS Performed at [facility], [address] [place], [place] [address]    Culture (A)  Final    ENTEROBACTER CLOACAE CRITICAL RESULT CALLED TO, READ BACK BY AND VERIFIED WITH: RN S.HINO AT 1158 ON 07/14/2021 BY T.SAAD. Performed at [facility], [place] [address], [place], [place] [address]    Report Status 07/15/2021 FINAL  Final    Organism ID, Bacteria ENTEROBACTER CLOACAE  Final      Susceptibility   Enterobacter cloacae - MIC*    CEFAZOLIN >=64 RESISTANT Resistant     CEFEPIME <=0.12 SENSITIVE Sensitive     CEFTAZIDIME <=1 SENSITIVE Sensitive     CIPROFLOXACIN <=0.25 SENSITIVE Sensitive     GENTAMICIN <=1 SENSITIVE Sensitive     IMIPENEM 0.5 SENSITIVE Sensitive     TRIMETH/SULFA <=20 SENSITIVE Sensitive     PIP/TAZO <=4 SENSITIVE Sensitive     * ENTEROBACTER CLOACAE  Culture, blood (Routine X 2) w Reflex to ID Panel     Status: None   Collection Time: 07/14/21  6:28 AM   Specimen: BLOOD  Result Value Ref Range Status   Specimen Description BLOOD RIGHT HAND  Final   Special Requests   Final    BOTTLES DRAWN AEROBIC AND ANAEROBIC Blood Culture adequate volume   Culture   Final    NO GROWTH 5 DAYS Performed at [facility], [address] [place], [place] [address]    Report Status 07/19/2021 FINAL  Final  Resp Panel by RT-PCR (Flu A&B, Covid) Nasopharyngeal Swab     Status: None   Collection Time: 07/20/21  9:27 AM   Specimen: Nasopharyngeal Swab; Nasopharyngeal(NP) swabs in vial transport medium  Result Value Ref Range Status   SARS Coronavirus 2 by RT  PCR NEGATIVE NEGATIVE Final    Comment: (NOTE) SARS-CoV-2 target nucleic acids are NOT DETECTED.  The SARS-CoV-2 RNA is generally detectable in upper respiratory specimens during the acute phase of infection. The lowest concentration of SARS-CoV-2 viral copies this assay can detect is 138 copies/mL. A negative result does not preclude SARS-Cov-2 infection and should not be used as the sole basis for treatment or other patient management decisions. A negative result may occur with  improper specimen collection/handling, submission of specimen other than nasopharyngeal swab, presence of viral mutation(s) within the areas targeted by this assay, and inadequate number of viral copies(<138 copies/mL). A negative result must be combined  with clinical observations, patient history, and epidemiological information. The expected result is Negative.  Fact Sheet for Patients:  EntrepreneurPulse.com.au  Fact Sheet for Healthcare Providers:  IncredibleEmployment.be  This test is no t yet approved or cleared by the [place] FDA and  has been authorized for detection and/or diagnosis of SARS-CoV-2 by FDA under an Emergency Use Authorization (EUA). This EUA will remain  in effect (meaning this test can be used) for the duration of the COVID-19 declaration under Section 564(b)(1) of the Act, 21 U.S.C.section 360bbb-3(b)(1), unless the authorization is terminated  or revoked sooner.       Influenza A by PCR NEGATIVE NEGATIVE Final   Influenza B by PCR NEGATIVE NEGATIVE Final    Comment: (NOTE) The Xpert Xpress SARS-CoV-2/FLU/RSV plus assay is intended as an aid in the diagnosis of influenza from Nasopharyngeal swab specimens and should not be used as a sole basis for treatment. Nasal washings and aspirates are unacceptable for Xpert Xpress SARS-CoV-2/FLU/RSV testing.  Fact Sheet for Patients: EntrepreneurPulse.com.au  Fact Sheet for Healthcare Providers: IncredibleEmployment.be  This test is not yet approved or cleared by the [place] FDA and has been authorized for detection and/or diagnosis of SARS-CoV-2 by FDA under an Emergency Use Authorization (EUA). This EUA will remain in effect (meaning this test can be used) for the duration of the COVID-19 declaration under Section 564(b)(1) of the Act, 21 U.S.C. section 360bbb-3(b)(1), unless the authorization is terminated or revoked.  Performed at [facility], [address] [place], [place] [address]     Time coordinating discharge: Over 30 minutes  SIGNED:  Lorella Nimrod, MD  [facility] 07/21/2021, 1:32 PM  If 7PM-7AM, please contact  night-coverage [email or web address]  This record has been created using Systems analyst. Errors have been sought and corrected,but may not always be located. Such creation errors do not reflect on the standard of care.

## 2021-07-20 NOTE — Progress Notes (Signed)
ID Sitting in chair No headache No distress  O/e alert Oriented x4 Patient Vitals for the past 24 hrs:  BP Temp Temp src Pulse Resp SpO2  07/20/21 0727 131/65 98.5 F (36.9 C) Oral 65 18 92 %  07/20/21 0343 (!) 142/64 98 F (36.7 C) Oral 65 19 94 %  07/19/21 2213 118/60 97.8 F (36.6 C) Oral 67 18 95 %  07/19/21 1734 122/75 98.6 F (37 C) Oral 75 16 97 %  07/19/21 1514 123/77 98.4 F (36.9 C) -- 78 16 95 %    No neck rigidity B/l air entry [id number] CNS non focal Lumbar surgical site- healed well Rt PICC   Labs CBC Latest Ref Rng & Units 07/20/2021 07/15/2021 07/14/2021  WBC 4.0 - 10.5 K/uL 8.6 14.0(H) 19.5(H)  Hemoglobin 12.0 - 15.0 g/dL 13.2 11.0(L) 10.5(L)  Hematocrit 36.0 - 46.0 % 37.3 31.7(L) 30.5(L)  Platelets 150 - 400 K/uL 312 223 235    CMP Latest Ref Rng & Units 07/20/2021 07/15/2021 07/14/2021  Glucose 70 - 99 mg/dL 147(H) 88 102(H)  BUN 8 - 23 mg/dL 26(H) 16 17  Creatinine 0.44 - 1.00 mg/dL 1.26(H) 1.02(H) 1.11(H)  Sodium 135 - 145 mmol/L 133(L) 140 140  Potassium 3.5 - 5.1 mmol/L 3.2(L) 3.6 3.8  Chloride 98 - 111 mmol/L 100 107 112(H)  CO2 22 - 32 mmol/L 22 24 21(L)  Calcium 8.9 - 10.3 mg/dL 10.4(H) 9.7 10.0  Total Protein 6.5 - 8.1 g/dL - - -  Total Bilirubin 0.3 - 1.2 mg/dL - - -  Alkaline Phos 38 - 126 U/L - - -  AST 15 - 41 U/L - - -  ALT 0 - 44 U/L - - -  Micro 07/12/2021 blood culture no growth 07/12/2021 CSF culture Enterobacter cloacae Susceptible to cefepime, ceftazidime, Cipro imipenem PIP Tazocin and Bactrim.  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 Was on meropenem since admisison which was switched to cefepime on 07/17/21  Will need for a total of 3 weeks.  End date would be 08/02/2021.  She does not have any hardware the CNS.  Hence repeat CSF not  needed.   Confusion/headache resolved  Degenerative disc disease.  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  CTA done this admission did not show any bleed  AKI on CKD improved  Hypothyroidism on Synthroid  Discussed the management with the patient,   and the care team.

## 2021-07-20 NOTE — TOC Progression Note (Signed)
Transition of Care [facility]) - Progression Note    Patient Details  Name: Latoya Freeman MRN: [id number] Date of Birth: 07/31/1941  Transition of Care [facility]) CM/SW Contact  Beverly Sessions, RN Phone Number: 07/20/2021, 10:57 AM  Clinical Narrative:     Per Lavella Lemons at [place] is still pending.  They are unable to accept any admissions today  Expected Discharge Plan: [place] Barriers to Discharge: Continued Medical Work up  Expected Discharge Plan and Services Expected Discharge Plan: [place] Choice: [place] arrangements for the past 2 months: Single Family Home                                       Social Determinants of Health (SDOH) Interventions    Readmission Risk Interventions No flowsheet data found.

## 2021-07-20 NOTE — Progress Notes (Signed)
Occupational Therapy Treatment Patient Details Name: Latoya Freeman MRN: [id number] DOB: 10/13/1941 Today's Date: 07/20/2021    History of present illness Pt is a 80 y.o. female with medical history significant for hypothyroidism, hypertension, GERD, status post recent L3-L4 discectomy and L4-L5 as well as L5-S1 decompression on 07/01/21 and was discharged home in stable condition.  She presents to the ER for evaluation of a headache which she described as the worst headache of her life.  MD assessment includes suspected bacterial meningitis, AKI, concern of delirium/mild underlying cognitive impairment, HTN, and general weakness.   OT comments  Pt seen for OT treatment on this date. Upon arrival to room, pt awake and seated upright in bed. Pt A&Ox2 and agreeable to OT evaluation. Pt reporting that she has been feeling dizzy with OOB mobility and orthostatics obtained:  Supine BP 120/56, HR 74; Seated (EOB) BP 120/67, HR 88; Standing 106/61; Seated (in recliner following transfer) 104/58; Seated (in recliner after 2 mins) 114/57, HR 76, SpO2 97%  Pt continues to present with cognition different from baseline, decreased balance, and decreased activity tolerance. Due to these impairments, pt requires MIN A for bed mobility, MIN A for seated LB dressing, CGA/MIN A for stand pivot transfers with RW, and MOD A for toilet hygiene. Pt is making good progress toward goals and continues to benefit from skilled OT services to maximize return to PLOF and minimize risk of future falls, injury, caregiver burden, and readmission. Will continue to follow POC. Discharge recommendation remains appropriate.     Follow Up Recommendations  SNF    Equipment Recommendations  Other (comment) (defer to next level of care)       Precautions / Restrictions Precautions Precautions: Fall Restrictions Weight Bearing Restrictions: No       Mobility Bed Mobility Overal bed mobility: Needs Assistance Bed Mobility:  Supine to Sit     Supine to sit: Min assist;HOB elevated     General bed mobility comments: MIN A for trunk support    Transfers Overall transfer level: Needs assistance Equipment used: Rolling walker (2 wheeled) Transfers: Sit to/from Stand Sit to Stand: Min guard;Min assist         General transfer comment: MIN GUARD from recliner, MIN A to/from [facility]    Balance Overall balance assessment: Needs assistance Sitting-balance support: Single extremity supported;Feet supported Sitting balance-Leahy Scale: Good Sitting balance - Comments: Good balance reaching outside BOS to don socks while seated EOB   Standing balance support: During functional activity;Bilateral upper extremity supported Standing balance-Leahy Scale: Fair Standing balance comment: CGA/MIN A to for stand pivot transfers with b/l UE support from RW                           ADL either performed or assessed with clinical judgement   ADL Overall ADL's : Needs assistance/impaired                     Lower Body Dressing: Minimal assistance;Sitting/lateral leans Lower Body Dressing Details (indicate cue type and reason): don socks Toilet Transfer: Minimal assistance;Stand-pivot;BSC Toilet Transfer Details (indicate cue type and reason): MIN A for posterior LOB during transfer Toileting- Clothing Manipulation and Hygiene: Moderate assistance;Sit to/from stand Toileting - Clothing Manipulation Details (indicate cue type and reason): able to complete anterior peri care sufficiently in sitting and requires MOD A for standing posterior peri care.     Functional mobility during ADLs: Rolling walker;Minimal assistance  Cognition Arousal/Alertness: Awake/alert Behavior During Therapy: WFL for tasks assessed/performed Overall Cognitive Status: No family/caregiver present to determine baseline cognitive functioning                                 General Comments: Pt oriented  to self and place, disoriented to date and situation. Pt pleasant and able to follow 1-step instructions consistently, however with incoherent speech at times              General Comments Pt c/o of dizziness in standing. Orthostatic vitals obtained: Supine BP 120/56, HR 74; Seated (EOB) BP 120/67, HR 88; Standing 106/61; Seated (in recliner following transfer) 104/58; Seated (in recliner after 2 mins) 114/57, HR 76, SpO2 97%    Pertinent Vitals/ Pain       Pain Assessment: No/denies pain         Frequency  Min 1X/week        Progress Toward Goals  OT Goals(current goals can now be found in the care plan section)  Progress towards OT goals: Progressing toward goals  Acute Rehab OT Goals Patient Stated Goal: to go home OT Goal Formulation: With patient Time For Goal Achievement: 07/28/21 Potential to Achieve Goals: Good  Plan Discharge plan remains appropriate;Frequency remains appropriate       AM-PAC OT "6 Clicks" Daily Activity     Outcome Measure   Help from another person eating meals?: None Help from another person taking care of personal grooming?: A Little Help from another person toileting, which includes using toliet, bedpan, or urinal?: A Lot Help from another person bathing (including washing, rinsing, drying)?: A Lot Help from another person to put on and taking off regular upper body clothing?: A Little Help from another person to put on and taking off regular lower body clothing?: A Lot 6 Click Score: 16    End of Session Equipment Utilized During Treatment: Rolling walker;Gait belt  OT Visit Diagnosis: Other abnormalities of gait and mobility (R26.89);Other symptoms and signs involving cognitive function   Activity Tolerance Patient tolerated treatment well   Patient Left in chair;with call bell/phone within reach;with chair alarm set   Nurse Communication Mobility status;Other (comment) (pt orthostatic)        Time: 2585-2778 OT Time  Calculation (min): 39 min  Charges: OT General Charges $OT Visit: 1 Visit OT Treatments $Self Care/Home Management : 38-52 mins  Fredirick Maudlin, OTR/L [place]

## 2021-07-20 NOTE — Care Management Important Message (Signed)
Important Message  Patient Details  Name: EIKO MCGOWEN MRN: [id number] Date of Birth: [birth year]   Medicare Important Message Given:  Yes     Dannette Barbara 07/20/2021, 2:16 PM

## 2021-07-20 NOTE — Progress Notes (Addendum)
PROGRESS NOTE    Latoya Freeman  TOI:[id number] DOB: 07/06/1941 DOA: 07/12/2021 PCP: [name], MD   Brief Narrative: Taken from prior notes. 78 y f with k/h/o hypothyroidism, HTN, GERD, s/p recent L3-L4 & L4-L5 discectomy and L5-S1 decompression on 07/01/21 admitted for worst headache of life. CSF c/s bacterial meningitis, growing Enterobacter cloacae, nosocomial infection per ID.  Infectious disease de-escalated antibiotics to cefepime according to susceptibility results.  Initially received meropenem and vancomycin. Will need 3 weeks of antibiotics, stop date is 08/02/2021.  Neurosurgery was consulted due to her history of recent L3-S1 decompression.  Per neurosurgery no sign of surgical infection.  Sutures still in place which needs to be removed before discharge by neurosurgery.  OT is now recommending SNF.  Daughter might not be able to take care of her at home. TOC is working on placement-had a bed offer at [facility], will not take her over the weekend. Likely discharge tomorrow-pending insurance authorization.  Subjective: Patient just finished with her breakfast when seen today.  Denies any complaints.  Advised patient to stay in chair most of the time instead of bed.  Assessment & Plan:   Principal Problem:   Acute bacterial meningitis Active Problems:   GERD (gastroesophageal reflux disease)   Thyroid disease   History of pulmonary embolism   Hypokalemia   Obesity (BMI 30-39.9)   Benign hypertension with CKD (chronic kidney disease) stage III (HCC)   Headache   Dehydration   Leukocytosis  Bacterial meningitis.  Some improvement in leukocytosis.  CSF cultures with Enterobacter closure, and nosocomial infection.  Blood cultures negative. History of recent spinal surgery, L3-S1 decompression with dural breach. Infectious diseases on board.  Initially received meropenem and vancomycin which were switched with cefepime and according to susceptibility results. -Continue  cefepime-till 08/02/2021. -PICC line placed.  Hypokalemia. -Replace potassium and monitor -Check magnesium  History of PE.  Eliquis was initially held due to elevated RBC on CSF.  Neurosurgery wants to have a CTA before resuming it which shows no acute abnormality, prior ICA aneurysm repaired. -Eliquis was resumed after having a CTA without any acute abnormality or bleeding.  Hypothyroidism. -Continue Synthroid  AKI with underlying CKD stage IIIa.  Creatinine at 1.26 today. -Encourage p.o. hydration  Concern of delirium/mild underlying cognitive impairment.  Per daughter patient did not had any cognitive impairment before recent hospitalizations.  Do endorse having concern of delirium and hallucinations during recent hospitalization for surgery. -Delirium precautions -Continue to monitor  Hypertension.  On multiple antihypertensives at home which were initially held due to softer blood pressure.  Blood pressure mildly elevated.  Home meds were restarted. -Continue home meds and monitor.  Generalized weakness. -PT/OT is recommending home health services, but daughter does not think that she will be able to provide the care she needs at this time.  Patient lives alone at baseline. -Repeat PT/OT evaluation is suggesting SNF placement.  Class II obesity. Estimated body mass index is 36.87 kg/m as calculated from the following:   Height as of this encounter: 5\' 1"  (1.549 m).   Weight as of this encounter: 88.5 kg.   Objective: Vitals:   07/19/21 1734 07/19/21 2213 07/20/21 0343 07/20/21 0727  BP: 122/75 118/60 (!) 142/64 131/65  Pulse: 75 67 65 65  Resp: 16 18 19 18   Temp: 98.6 F (37 C) 97.8 F (36.6 C) 98 F (36.7 C) 98.5 F (36.9 C)  TempSrc: Oral Oral Oral Oral  SpO2: 97% 95% 94% 92%  Weight:  Height:        Intake/Output Summary (Last 24 hours) at 07/20/2021 1644 Last data filed at 07/20/2021 7782 Gross per 24 hour  Intake 286.52 ml  Output 1000 ml  Net -713.48  ml    Filed Weights   07/12/21 0529  Weight: 88.5 kg    Examination:  General.  Well-developed elderly lady, in no acute distress. Pulmonary.  Lungs clear bilaterally, normal respiratory effort. CV.  Regular rate and rhythm, no JVD, rub or murmur. Abdomen.  Soft, nontender, nondistended, BS positive. CNS.  Alert and oriented.  No focal neurologic deficit. Extremities.  No edema, no cyanosis, pulses intact and symmetrical. Psychiatry.  Judgment and insight appears normal.   DVT prophylaxis: Eliquis Code Status: Full Family Communication: Talked with daughter on phone. Disposition Plan:  Status is: Inpatient  Remains inpatient appropriate because:Inpatient level of care appropriate due to severity of illness  Dispo: The patient is from: Home              Anticipated d/c is to: SNF              Patient currently is medically stable.   Difficult to place patient No               Level of care: Med-Surg  All the records are reviewed and case discussed with Care Management/Social Worker. Management plans discussed with the patient, nursing and they are in agreement.  Consultants:  Infectious disease  Procedures:  Lumbar puncture  Antimicrobials:  Cefepime  Data Reviewed: I have personally reviewed following labs and imaging studies  CBC: Recent Labs  Lab 07/14/21 0628 07/15/21 0408 07/20/21 0927  WBC 19.5* 14.0* 8.6  HGB 10.5* 11.0* 13.2  HCT 30.5* 31.7* 37.3  MCV 91.6 94.6 90.5  PLT 235 223 423    Basic Metabolic Panel: Recent Labs  Lab 07/14/21 0628 07/15/21 0408 07/20/21 0927  NA 140 140 133*  K 3.8 3.6 3.2*  CL 112* 107 100  CO2 21* 24 22  GLUCOSE 102* 88 147*  BUN 17 16 26*  CREATININE 1.11* 1.02* 1.26*  CALCIUM 10.0 9.7 10.4*    GFR: Estimated Creatinine Clearance: 36.6 mL/min (A) (by C-G formula based on SCr of 1.26 mg/dL (H)). Liver Function Tests: No results for input(s): AST, ALT, ALKPHOS, BILITOT, PROT, ALBUMIN in the last 168  hours.  No results for input(s): LIPASE, AMYLASE in the last 168 hours. No results for input(s): AMMONIA in the last 168 hours. Coagulation Profile: No results for input(s): INR, PROTIME in the last 168 hours.  Cardiac Enzymes: No results for input(s): CKTOTAL, CKMB, CKMBINDEX, TROPONINI in the last 168 hours. BNP (last 3 results) No results for input(s): PROBNP in the last 8760 hours. HbA1C: No results for input(s): HGBA1C in the last 72 hours. CBG: No results for input(s): GLUCAP in the last 168 hours.  Lipid Profile: No results for input(s): CHOL, HDL, LDLCALC, TRIG, CHOLHDL, LDLDIRECT in the last 72 hours. Thyroid Function Tests: No results for input(s): TSH, T4TOTAL, FREET4, T3FREE, THYROIDAB in the last 72 hours. Anemia Panel: No results for input(s): VITAMINB12, FOLATE, FERRITIN, TIBC, IRON, RETICCTPCT in the last 72 hours. Sepsis Labs: No results for input(s): PROCALCITON, LATICACIDVEN in the last 168 hours.  Recent Results (from the past 240 hour(s))  Resp Panel by RT-PCR (Flu A&B, Covid) Nasopharyngeal Swab     Status: None   Collection Time: 07/12/21 10:44 AM   Specimen: Nasopharyngeal Swab; Nasopharyngeal(NP) swabs in vial transport medium  Result Value Ref Range Status   SARS Coronavirus 2 by RT PCR NEGATIVE NEGATIVE Final    Comment: (NOTE) SARS-CoV-2 target nucleic acids are NOT DETECTED.  The SARS-CoV-2 RNA is generally detectable in upper respiratory specimens during the acute phase of infection. The lowest concentration of SARS-CoV-2 viral copies this assay can detect is 138 copies/mL. A negative result does not preclude SARS-Cov-2 infection and should not be used as the sole basis for treatment or other patient management decisions. A negative result may occur with  improper specimen collection/handling, submission of specimen other than nasopharyngeal swab, presence of viral mutation(s) within the areas targeted by this assay, and inadequate number of  viral copies(<138 copies/mL). A negative result must be combined with clinical observations, patient history, and epidemiological information. The expected result is Negative.  Fact Sheet for Patients:  EntrepreneurPulse.com.au  Fact Sheet for Healthcare Providers:  IncredibleEmployment.be  This test is no t yet approved or cleared by the [place] FDA and  has been authorized for detection and/or diagnosis of SARS-CoV-2 by FDA under an Emergency Use Authorization (EUA). This EUA will remain  in effect (meaning this test can be used) for the duration of the COVID-19 declaration under Section 564(b)(1) of the Act, 21 U.S.C.section 360bbb-3(b)(1), unless the authorization is terminated  or revoked sooner.       Influenza A by PCR NEGATIVE NEGATIVE Final   Influenza B by PCR NEGATIVE NEGATIVE Final    Comment: (NOTE) The Xpert Xpress SARS-CoV-2/FLU/RSV plus assay is intended as an aid in the diagnosis of influenza from Nasopharyngeal swab specimens and should not be used as a sole basis for treatment. Nasal washings and aspirates are unacceptable for Xpert Xpress SARS-CoV-2/FLU/RSV testing.  Fact Sheet for Patients: EntrepreneurPulse.com.au  Fact Sheet for Healthcare Providers: IncredibleEmployment.be  This test is not yet approved or cleared by the [place] FDA and has been authorized for detection and/or diagnosis of SARS-CoV-2 by FDA under an Emergency Use Authorization (EUA). This EUA will remain in effect (meaning this test can be used) for the duration of the COVID-19 declaration under Section 564(b)(1) of the Act, 21 U.S.C. section 360bbb-3(b)(1), unless the authorization is terminated or revoked.  Performed at [facility], [address] [place], [place] [address]   Blood culture (routine x 2)     Status: None   Collection Time: 07/12/21  1:15 PM   Specimen: BLOOD  Result  Value Ref Range Status   Specimen Description BLOOD RIGHT HAND  Final   Special Requests   Final    BOTTLES DRAWN AEROBIC AND ANAEROBIC Blood Culture adequate volume   Culture   Final    NO GROWTH 5 DAYS Performed at [facility], [address] [place], [place] [address]    Report Status 07/17/2021 FINAL  Final  CSF culture w Gram Stain     Status: Abnormal   Collection Time: 07/12/21  1:20 PM   Specimen: PATH Cytology CSF; Cerebrospinal Fluid  Result Value Ref Range Status   Specimen Description   Final    CSF TUBE 4 Performed at [facility], [address] [place], [place] [address]    Special Requests   Final    NONE Performed at [facility], [address] [place], [place] [address]    Gram Stain   Final    NO ORGANISMS SEEN WBC SEEN RED BLOOD CELLS RESULT CALLED TO, READ BACK BY AND VERIFIED WITH: [place] AT 7673 ON 07/12/21 BY  SS Performed at [facility], [address] [place], [place] [address]    Culture (A)  Final    ENTEROBACTER CLOACAE CRITICAL RESULT CALLED TO, READ BACK BY AND VERIFIED WITH: RN S.HINO AT 1158 ON 07/14/2021 BY T.SAAD. Performed at [facility], [place] [address], [place], [place] [address]    Report Status 07/15/2021 FINAL  Final   Organism ID, Bacteria ENTEROBACTER CLOACAE  Final      Susceptibility   Enterobacter cloacae - MIC*    CEFAZOLIN >=64 RESISTANT Resistant     CEFEPIME <=0.12 SENSITIVE Sensitive     CEFTAZIDIME <=1 SENSITIVE Sensitive     CIPROFLOXACIN <=0.25 SENSITIVE Sensitive     GENTAMICIN <=1 SENSITIVE Sensitive     IMIPENEM 0.5 SENSITIVE Sensitive     TRIMETH/SULFA <=20 SENSITIVE Sensitive     PIP/TAZO <=4 SENSITIVE Sensitive     * ENTEROBACTER CLOACAE  Culture, blood (Routine X 2) w Reflex to ID Panel     Status: None   Collection Time: 07/14/21  6:28 AM   Specimen: BLOOD  Result Value Ref Range Status   Specimen Description BLOOD RIGHT HAND  Final   Special Requests    Final    BOTTLES DRAWN AEROBIC AND ANAEROBIC Blood Culture adequate volume   Culture   Final    NO GROWTH 5 DAYS Performed at [facility], [address] [place], [place] [address]    Report Status 07/19/2021 FINAL  Final  Resp Panel by RT-PCR (Flu A&B, Covid) Nasopharyngeal Swab     Status: None   Collection Time: 07/20/21  9:27 AM   Specimen: Nasopharyngeal Swab; Nasopharyngeal(NP) swabs in vial transport medium  Result Value Ref Range Status   SARS Coronavirus 2 by RT PCR NEGATIVE NEGATIVE Final    Comment: (NOTE) SARS-CoV-2 target nucleic acids are NOT DETECTED.  The SARS-CoV-2 RNA is generally detectable in upper respiratory specimens during the acute phase of infection. The lowest concentration of SARS-CoV-2 viral copies this assay can detect is 138 copies/mL. A negative result does not preclude SARS-Cov-2 infection and should not be used as the sole basis for treatment or other patient management decisions. A negative result may occur with  improper specimen collection/handling, submission of specimen other than nasopharyngeal swab, presence of viral mutation(s) within the areas targeted by this assay, and inadequate number of viral copies(<138 copies/mL). A negative result must be combined with clinical observations, patient history, and epidemiological information. The expected result is Negative.  Fact Sheet for Patients:  EntrepreneurPulse.com.au  Fact Sheet for Healthcare Providers:  IncredibleEmployment.be  This test is no t yet approved or cleared by the [place] FDA and  has been authorized for detection and/or diagnosis of SARS-CoV-2 by FDA under an Emergency Use Authorization (EUA). This EUA will remain  in effect (meaning this test can be used) for the duration of the COVID-19 declaration under Section 564(b)(1) of the Act, 21 U.S.C.section 360bbb-3(b)(1), unless the authorization is terminated  or revoked  sooner.       Influenza A by PCR NEGATIVE NEGATIVE Final   Influenza B by PCR NEGATIVE NEGATIVE Final    Comment: (NOTE) The Xpert Xpress SARS-CoV-2/FLU/RSV plus assay is intended as an aid in the diagnosis of influenza from Nasopharyngeal swab specimens and should not be used as a sole basis for treatment. Nasal washings and aspirates are unacceptable for Xpert Xpress SARS-CoV-2/FLU/RSV testing.  Fact Sheet for Patients: EntrepreneurPulse.com.au  Fact Sheet for Healthcare Providers: IncredibleEmployment.be  This test  is not yet approved or cleared by the [place] and has been authorized for detection and/or diagnosis of SARS-CoV-2 by FDA under an Emergency Use Authorization (EUA). This EUA will remain in effect (meaning this test can be used) for the duration of the COVID-19 declaration under Section 564(b)(1) of the Act, 21 U.S.C. section 360bbb-3(b)(1), unless the authorization is terminated or revoked.  Performed at [facility], [address] [place], [place] [address]       Radiology Studies: No results found.  Scheduled Meds:  amLODipine  7.5 mg Oral Daily   apixaban  5 mg Oral BID   Chlorhexidine Gluconate Cloth  6 each Topical Daily   citalopram  20 mg Oral Daily   levothyroxine  75 mcg Oral Daily   loratadine  10 mg Oral Daily   losartan  50 mg Oral Daily   pantoprazole  40 mg Oral Daily   senna  1 tablet Oral BID   sodium chloride flush  10-40 mL Intracatheter Q12H   triamterene-hydrochlorothiazide  1 tablet Oral Daily   Continuous Infusions:  sodium chloride Stopped (07/20/21 0625)   ceFEPime (MAXIPIME) IV 200 mL/hr at 07/20/21 0626     LOS: 8 days   Time spent: 28 minutes. More than 50% of the time was spent in counseling/coordination of care  Lorella Nimrod, MD [facility]  If 7PM-7AM, please contact night-coverage [email or web address]  07/20/2021, 4:44 PM   This record has been  created using Systems analyst. Errors have been sought and corrected,but may not always be located. Such creation errors do not reflect on the standard of care.

## 2021-07-20 NOTE — TOC Progression Note (Signed)
Transition of Care [facility]) - Progression Note    Patient Details  Name: Latoya Freeman MRN: [id number] Date of Birth: [birth year]  Transition of Care [facility]) CM/SW Contact  Beverly Sessions, RN Phone Number: 07/20/2021, 9:03 AM  Clinical Narrative:     Reached out to Lavella Lemons at [facility] to determine if she has received auth.  Awaiting Response Will need covid test prior to discharge   Expected Discharge Plan: [place] Barriers to Discharge: Continued Medical Work up  Expected Discharge Plan and Services Expected Discharge Plan: [place] Choice: [place] arrangements for the past 2 months: Single Family Home                                       Social Determinants of Health (SDOH) Interventions    Readmission Risk Interventions No flowsheet data found.

## 2021-07-21 DIAGNOSIS — Z880 Allergy status to penicillin: Secondary | ICD-10-CM | POA: Diagnosis not present

## 2021-07-21 DIAGNOSIS — R5381 Other malaise: Secondary | ICD-10-CM | POA: Diagnosis not present

## 2021-07-21 DIAGNOSIS — G008 Other bacterial meningitis: Secondary | ICD-10-CM | POA: Diagnosis not present

## 2021-07-21 DIAGNOSIS — E079 Disorder of thyroid, unspecified: Secondary | ICD-10-CM | POA: Diagnosis not present

## 2021-07-21 DIAGNOSIS — I129 Hypertensive chronic kidney disease with stage 1 through stage 4 chronic kidney disease, or unspecified chronic kidney disease: Secondary | ICD-10-CM | POA: Diagnosis not present

## 2021-07-21 DIAGNOSIS — R519 Headache, unspecified: Secondary | ICD-10-CM | POA: Diagnosis not present

## 2021-07-21 DIAGNOSIS — D649 Anemia, unspecified: Secondary | ICD-10-CM | POA: Diagnosis not present

## 2021-07-21 DIAGNOSIS — Z7901 Long term (current) use of anticoagulants: Secondary | ICD-10-CM | POA: Diagnosis not present

## 2021-07-21 DIAGNOSIS — G894 Chronic pain syndrome: Secondary | ICD-10-CM | POA: Diagnosis not present

## 2021-07-21 DIAGNOSIS — Z7401 Bed confinement status: Secondary | ICD-10-CM | POA: Diagnosis not present

## 2021-07-21 DIAGNOSIS — X58XXXA Exposure to other specified factors, initial encounter: Secondary | ICD-10-CM | POA: Diagnosis not present

## 2021-07-21 DIAGNOSIS — G009 Bacterial meningitis, unspecified: Secondary | ICD-10-CM | POA: Diagnosis not present

## 2021-07-21 DIAGNOSIS — Z86711 Personal history of pulmonary embolism: Secondary | ICD-10-CM | POA: Diagnosis not present

## 2021-07-21 DIAGNOSIS — E860 Dehydration: Secondary | ICD-10-CM | POA: Diagnosis not present

## 2021-07-21 DIAGNOSIS — E669 Obesity, unspecified: Secondary | ICD-10-CM | POA: Diagnosis not present

## 2021-07-21 DIAGNOSIS — Z79899 Other long term (current) drug therapy: Secondary | ICD-10-CM | POA: Diagnosis not present

## 2021-07-21 DIAGNOSIS — R531 Weakness: Secondary | ICD-10-CM | POA: Diagnosis not present

## 2021-07-21 DIAGNOSIS — N183 Chronic kidney disease, stage 3 unspecified: Secondary | ICD-10-CM | POA: Diagnosis not present

## 2021-07-21 DIAGNOSIS — E039 Hypothyroidism, unspecified: Secondary | ICD-10-CM | POA: Diagnosis not present

## 2021-07-21 DIAGNOSIS — M6281 Muscle weakness (generalized): Secondary | ICD-10-CM | POA: Diagnosis not present

## 2021-07-21 DIAGNOSIS — Z79890 Hormone replacement therapy (postmenopausal): Secondary | ICD-10-CM | POA: Diagnosis not present

## 2021-07-21 DIAGNOSIS — B9689 Other specified bacterial agents as the cause of diseases classified elsewhere: Secondary | ICD-10-CM | POA: Diagnosis not present

## 2021-07-21 DIAGNOSIS — T8140XA Infection following a procedure, unspecified, initial encounter: Secondary | ICD-10-CM | POA: Diagnosis not present

## 2021-07-21 DIAGNOSIS — N1831 Chronic kidney disease, stage 3a: Secondary | ICD-10-CM | POA: Diagnosis not present

## 2021-07-21 DIAGNOSIS — G3184 Mild cognitive impairment, so stated: Secondary | ICD-10-CM | POA: Diagnosis not present

## 2021-07-21 DIAGNOSIS — Z91030 Bee allergy status: Secondary | ICD-10-CM | POA: Diagnosis not present

## 2021-07-21 DIAGNOSIS — D72829 Elevated white blood cell count, unspecified: Secondary | ICD-10-CM | POA: Diagnosis not present

## 2021-07-21 DIAGNOSIS — Z87891 Personal history of nicotine dependence: Secondary | ICD-10-CM | POA: Diagnosis not present

## 2021-07-21 DIAGNOSIS — D509 Iron deficiency anemia, unspecified: Secondary | ICD-10-CM | POA: Diagnosis not present

## 2021-07-21 DIAGNOSIS — I10 Essential (primary) hypertension: Secondary | ICD-10-CM | POA: Diagnosis not present

## 2021-07-21 LAB — BASIC METABOLIC PANEL
Anion gap: 8 (ref 5–15)
BUN: 35 mg/dL — ABNORMAL HIGH (ref 8–23)
CO2: 26 mmol/L (ref 22–32)
Calcium: 10.5 mg/dL — ABNORMAL HIGH (ref 8.9–10.3)
Chloride: 100 mmol/L (ref 98–111)
Creatinine, Ser: 1.23 mg/dL — ABNORMAL HIGH (ref 0.44–1.00)
GFR, Estimated: 45 mL/min — ABNORMAL LOW (ref >60)
Glucose, Bld: 97 mg/dL (ref 70–99)
Potassium: 3.9 mmol/L (ref 3.5–5.1)
Sodium: 134 mmol/L — ABNORMAL LOW (ref 135–145)

## 2021-07-21 LAB — MAGNESIUM: Magnesium: 2.6 mg/dL — ABNORMAL HIGH (ref 1.7–2.4)

## 2021-07-21 MED ORDER — HYDROCODONE-ACETAMINOPHEN 5-325 MG PO TABS: 1.0000 | ORAL_TABLET | Freq: Four times a day (QID) | ORAL | 0 refills | Status: DC | PRN

## 2021-07-21 MED ORDER — CEFEPIME IV (FOR PTA / DISCHARGE USE ONLY): 2.0000 g | Freq: Two times a day (BID) | INTRAVENOUS | 0 refills | Status: AC

## 2021-07-21 MED ORDER — ACETAMINOPHEN 325 MG PO TABS: 650.0000 mg | ORAL_TABLET | Freq: Four times a day (QID) | ORAL | Status: AC | PRN

## 2021-07-21 NOTE — Progress Notes (Signed)
Mobility Specialist - Progress Note   07/21/21 1000  Mobility  Activity Ambulated in room;Ambulated to bathroom  Level of Assistance Standby assist, set-up cues, supervision of patient - no hands on  Assistive Device Front wheel walker  Distance Ambulated (ft) 35 ft  Mobility Ambulated with assistance in room;Out of bed for toileting  Mobility Response Tolerated well  Mobility performed by Mobility specialist  $Mobility charge 1 Mobility    Pre-mobility: 73 HR, 123/68 BP, 98% SpO2 Post-mobility: 130/83 BP   Pt ambulated in room and to bathroom for BM. Mild dizziness upon standing. C/o pain in R foot. In bed with alarm set, needs in reach.    Kathee Delton Mobility Specialist 07/21/21, 11:04 AM

## 2021-07-21 NOTE — TOC Progression Note (Addendum)
Transition of Care [facility]) - Progression Note    Patient Details  Name: DEVETTA HAGENOW MRN: [id number] Date of Birth: 12-14-1940  Transition of Care [facility]) CM/SW [place], LCSW Phone Number: 07/21/2021, 10:21 AM  Clinical Narrative:   [facility] got insurance approval but is unsure when they would be able to accept her due to staffing issues. They said patient may need to look at another facility. Only other bed offer is [facility] which was daughter's second preference. Patient and daughter are aware and agreeable. [facility] admissions coordinator is going to call and see if she can transfer the [place] approval to them or if she'll need to start a new one.  10:32 am: [facility] is starting insurance authorization. Admissions coordinator was told they may be able to expedite it since it was already approved with [facility].  Expected Discharge Plan: [place] Barriers to Discharge: Continued Medical Work up  Expected Discharge Plan and Services Expected Discharge Plan: [place] Choice: [place] arrangements for the past 2 months: Single Family Home                                       Social Determinants of Health (SDOH) Interventions    Readmission Risk Interventions No flowsheet data found.

## 2021-07-21 NOTE — TOC Transition Note (Signed)
Transition of Care [facility]) - CM/SW Discharge Note   Patient Details  Name: Latoya Freeman MRN: [id number] Date of Birth: Jun 29, 1941  Transition of Care [facility]) CM/SW Contact:  Latoya Chroman, LCSW Phone Number: 07/21/2021, 2:42 PM   Clinical Narrative:   Patient has orders to discharge to [facility] today. Insurance authorization approved. RN will call report to [phone] (Room 301). EMS transport has been arranged for 4:00. RN will call daughter when they arrive. No further concerns. CSW signing off.  Final next level of care: Skilled Nursing Facility Barriers to Discharge: Barriers Resolved   Patient Goals and CMS Choice Patient states their goals for this hospitalization and ongoing recovery are:: to go home CMS Medicare.gov Compare Post Acute Care list provided to:: Patient Represenative (must comment) Choice offered to / list presented to : Patient, Adult Children  Discharge Placement PASRR number recieved: 07/16/21            Patient chooses bed at: [facility] Patient to be transferred to facility by: EMS Name of family member notified: Alinda Dooms Patient and family notified of of transfer: 07/21/21  Discharge Plan and Services     Post Acute Care Choice: Home Health                               Social Determinants of Health (SDOH) Interventions     Readmission Risk Interventions No flowsheet data found.

## 2021-07-27 DIAGNOSIS — G894 Chronic pain syndrome: Secondary | ICD-10-CM | POA: Diagnosis not present

## 2021-07-27 DIAGNOSIS — G009 Bacterial meningitis, unspecified: Secondary | ICD-10-CM | POA: Diagnosis not present

## 2021-07-27 DIAGNOSIS — D509 Iron deficiency anemia, unspecified: Secondary | ICD-10-CM | POA: Diagnosis not present

## 2021-07-27 DIAGNOSIS — N1831 Chronic kidney disease, stage 3a: Secondary | ICD-10-CM | POA: Diagnosis not present

## 2021-07-27 DIAGNOSIS — M6281 Muscle weakness (generalized): Secondary | ICD-10-CM | POA: Diagnosis not present

## 2021-07-29 DIAGNOSIS — D649 Anemia, unspecified: Secondary | ICD-10-CM | POA: Diagnosis not present

## 2021-07-30 ENCOUNTER — Other Ambulatory Visit: Payer: Self-pay

## 2021-07-30 ENCOUNTER — Ambulatory Visit: Payer: Medicare HMO | Attending: Infectious Diseases | Admitting: Infectious Diseases

## 2021-07-30 DIAGNOSIS — Z880 Allergy status to penicillin: Secondary | ICD-10-CM | POA: Insufficient documentation

## 2021-07-30 DIAGNOSIS — E039 Hypothyroidism, unspecified: Secondary | ICD-10-CM | POA: Insufficient documentation

## 2021-07-30 DIAGNOSIS — G009 Bacterial meningitis, unspecified: Secondary | ICD-10-CM

## 2021-07-30 DIAGNOSIS — Z7901 Long term (current) use of anticoagulants: Secondary | ICD-10-CM | POA: Diagnosis not present

## 2021-07-30 DIAGNOSIS — X58XXXA Exposure to other specified factors, initial encounter: Secondary | ICD-10-CM | POA: Diagnosis not present

## 2021-07-30 DIAGNOSIS — Z91030 Bee allergy status: Secondary | ICD-10-CM | POA: Insufficient documentation

## 2021-07-30 DIAGNOSIS — Z79890 Hormone replacement therapy (postmenopausal): Secondary | ICD-10-CM | POA: Insufficient documentation

## 2021-07-30 DIAGNOSIS — B9689 Other specified bacterial agents as the cause of diseases classified elsewhere: Secondary | ICD-10-CM | POA: Insufficient documentation

## 2021-07-30 DIAGNOSIS — I10 Essential (primary) hypertension: Secondary | ICD-10-CM | POA: Diagnosis not present

## 2021-07-30 DIAGNOSIS — Z79899 Other long term (current) drug therapy: Secondary | ICD-10-CM | POA: Diagnosis not present

## 2021-07-30 DIAGNOSIS — Z86711 Personal history of pulmonary embolism: Secondary | ICD-10-CM | POA: Insufficient documentation

## 2021-07-30 DIAGNOSIS — T8140XA Infection following a procedure, unspecified, initial encounter: Secondary | ICD-10-CM | POA: Insufficient documentation

## 2021-07-30 DIAGNOSIS — Z87891 Personal history of nicotine dependence: Secondary | ICD-10-CM | POA: Diagnosis not present

## 2021-07-30 DIAGNOSIS — G008 Other bacterial meningitis: Secondary | ICD-10-CM | POA: Insufficient documentation

## 2021-07-30 NOTE — Progress Notes (Signed)
The purpose of this virtual visit is to provide medical care while limiting exposure to the novel coronavirus (COVID19) for both patient and office staff.   Consent was obtained for phone visit:  Yes.   Answered questions that patient had about telehealth interaction:  Yes.   I discussed the limitations, risks, security and privacy concerns of performing an evaluation and management service by telephone. I also discussed with the patient that there may be a patient responsible charge related to this service. The patient expressed understanding and agreed to proceed.   Patient Location:  [facility] Provider Location: office People on the call : patient, daughter and provider  NAME: Latoya Freeman  DOB: [birth year]  MRN: [id number]  Date/Time: 07/30/2021 2:46 PM ? Latoya Freeman is a 80 y.o. female with a history of clipped cerebral aneurysm, HTN, hypothyroidism, PE was recently in hospital between 8/21-8/30/22 for headache and altered mental status and was diagnosed with enterobacter meningitis. Pt had recent  L3-l4 microdiscectomy L4-S1 laminectomyon 07/01/21 during the lumbar surgery there was dural breach due to a synovial cyst and it was sutured. She was doing well after surgery but developed severe headache  few days later. In the hospital she had LP and it showed 14,350 WBC, RBC 8877, Total protein > 600 and glucose < 20. She was started on vanco and meropenem- As the culture came back as enterobacter cloacae the meropenem was switched to Cefepime and patient improved over a few dys- As there was no hardware in the lumbar spine it was decided not to reapt a follow up LP. Pt was [facility] to [facility] a total of 3 weeks of IV antibiotic.  Today pt is with her daughter for this visit  She is feeling great She want to go home  She has no headache, neck pain, fever, nausea , vomiting, rash, diarrhea or back pain. She is ambulating and independent She will complete Iv on 08/02/21   Past Medical  History:  Diagnosis Date   Aneurysm ([place]) 1980   Arthritis    Cancer ([place])    GERD (gastroesophageal reflux disease)    Hypertension 1980   Hypothyroidism 1999   Pneumonia    Pulmonary embolism ([place])    Shingles    Wears dentures    full upper and lower    Past Surgical History:  Procedure Laterality Date   BRAIN SURGERY     aneurysm   BREAST CYST ASPIRATION Left 2005   BREAST EXCISIONAL BIOPSY Left 2013   atypical ductal hyperplasia   BREAST SURGERY  2013   LF Breast Wide EXC    CHOLECYSTECTOMY  2004   COLONOSCOPY     COLONOSCOPY WITH PROPOFOL N/A 01/31/2018   Procedure: COLONOSCOPY WITH PROPOFOL;  Surgeon: Lollie Sails, MD;  Location: [facility] ENDOSCOPY;  Service: Endoscopy;  Laterality: N/A;   COLONOSCOPY WITH PROPOFOL N/A 08/06/2020   Procedure: COLONOSCOPY WITH PROPOFOL;  Surgeon: Virgel Manifold, MD;  Location: [facility] ENDOSCOPY;  Service: Endoscopy;  Laterality: N/A;   DILATION AND CURETTAGE OF UTERUS N/A 05/03/2019   Procedure: DILATATION AND CURETTAGE;  Surgeon: Gae Dry, MD;  Location: [facility] ORS;  Service: Gynecology;  Laterality: N/A;   ESOPHAGOGASTRODUODENOSCOPY N/A 01/31/2018   Procedure: ESOPHAGOGASTRODUODENOSCOPY (EGD);  Surgeon: Lollie Sails, MD;  Location: [facility] ENDOSCOPY;  Service: Endoscopy;  Laterality: N/A;   ESOPHAGOGASTRODUODENOSCOPY (EGD) WITH PROPOFOL N/A 08/06/2020   Procedure: ESOPHAGOGASTRODUODENOSCOPY (EGD) WITH PROPOFOL;  Surgeon: Virgel Manifold, MD;  Location: [facility] ENDOSCOPY;  Service: Endoscopy;  Laterality: N/A;   LUMBAR LAMINECTOMY/DECOMPRESSION MICRODISCECTOMY N/A 07/01/2021   Procedure: LEFT L3-4 MICRODSICECTOMY, L4-S1 DECOMPRESSION;  Surgeon: Meade Maw, MD;  Location: [facility] ORS;  Service: Neurosurgery;  Laterality: N/A;   SHOULDER ARTHROSCOPY Left 07/25/2018   Procedure: SHOULDER MINI OPEN ROTATOR CUFF REPAIR  BICEPS TENDOSIS ARTHROSCOPIC DISTAL CLAVICLE EXCISION  SUBACROMIAL DECOMP;  Surgeon: Leim Fabry, MD;   Location: [place];  Service: Orthopedics;  Laterality: Left;  [facility] WITH SPYDER SMITH & NEPHEW HEAD COIL ANCHOR FOOTPRINT ANCHOR QFIX ANCHOR   VISCERAL ANGIOGRAPHY N/A 08/14/2020   Procedure: VISCERAL ANGIOGRAPHY;  Surgeon: Algernon Huxley, MD;  Location: [place] CV LAB;  Service: Cardiovascular;  Laterality: N/A;    Social History   Socioeconomic History   Marital status: Divorced    Spouse name: Not on file   Number of children: Not on file   Years of education: Not on file   Highest education level: Not on file  Occupational History   Not on file  Tobacco Use   Smoking status: Former    Packs/day: 1.50    Years: 20.00    Pack years: 30.00    Types: Cigarettes    Quit date: 71    Years since quitting: 32.7   Smokeless tobacco: Never  Vaping Use   Vaping Use: Never used  Substance and Sexual Activity   Alcohol use: No   Drug use: No   Sexual activity: Not on file  Other Topics Concern   Not on file  Social History Narrative   Lives alone   Social Determinants of Health   Financial Resource Strain: Not on file  Food Insecurity: Not on file  Transportation Needs: Not on file  Physical Activity: Not on file  Stress: Not on file  Social Connections: Not on file  Intimate Partner Violence: Not on file    Family History  Problem Relation Age of Onset   Breast cancer Neg Hx    Allergies  Allergen Reactions   Bee Venom Swelling   Shellfish Allergy Swelling   Tamoxifen Hives   Penicillins Hives, Rash and Other (See Comments)    Has patient had a PCN reaction causing immediate rash, facial/tongue/throat swelling, SOB or lightheadedness with hypotension: Unknown Has patient had a PCN reaction causing severe rash involving mucus membranes or skin necrosis: Unknown Has patient had a PCN reaction that required hospitalization: Unknown Has patient had a PCN reaction occurring within the last 10 years: No If all of the above answers are "NO", then  may proceed with Cephalosporin use.    I? Current Outpatient Medications  Medication Sig Dispense Refill   acetaminophen (TYLENOL) 325 MG tablet Take 2 tablets (650 mg total) by mouth every 6 (six) hours as needed for mild pain, moderate pain, fever or headache (or Fever >/= 101).     amLODipine (NORVASC) 5 MG tablet Take 7.5 mg by mouth daily.     apixaban (ELIQUIS) 5 MG TABS tablet Take 5 mg by mouth 2 (two) times daily.     bisacodyl (DULCOLAX) 5 MG EC tablet Take 1 tablet (5 mg total) by mouth daily as needed for moderate constipation. 30 tablet 0   ceFEPime (MAXIPIME) IVPB Inject 2 g into the vein every 12 (twelve) hours for 16 days. Indication:  Enterobacter cloacae meningitis First Dose: Yes Adjust cefepime to q8hr for CrCl > 60 Last Day of Therapy:  08/02/2021 Labs weekly  Monday while on IV antibiotics: CBC with differential, CMP Labs  every Thursday while on antibiotics: BMP Please pull PIC at completion of IV antibiotics Fax weekly labs promptly to Dr.Laquanna Veazey  to [phone] Method of administration: IV Push Method of administration may be changed at the discretion of home infusion pharmacist based upon assessment of the patient and/or caregiver's ability to self-administer the medication ordered. 32 Units 0   cetirizine (ZYRTEC) 10 MG tablet Take 10 mg by mouth daily as needed for allergies.     citalopram (CELEXA) 20 MG tablet Take 20 mg by mouth daily.     clopidogrel (PLAVIX) 75 MG tablet Take 75 mg by mouth daily.     HYDROcodone-acetaminophen (NORCO/VICODIN) 5-325 MG tablet Take 1 tablet by mouth every 6 (six) hours as needed for moderate pain or severe pain. 30 tablet 0   levothyroxine (SYNTHROID, LEVOTHROID) 75 MCG tablet Take 75 mcg by mouth daily.     losartan (COZAAR) 50 MG tablet Take 50 mg by mouth daily.      omeprazole (PRILOSEC) 20 MG capsule Take 20 mg by mouth 2 (two) times daily.     senna (SENOKOT) 8.6 MG TABS tablet Take 1 tablet (8.6 mg total) by mouth  2 (two) times daily. 120 tablet 0   triamterene-hydrochlorothiazide (MAXZIDE-25) 37.5-25 MG tablet Take 1 tablet by mouth daily.     No current facility-administered medications for this visit.     Abtx:  Anti-infectives (From admission, onward)    None       REVIEW OF SYSTEMS:  Const: negative fever, negative chills, negative weight loss Eyes: negative diplopia or visual changes, negative eye pain ENT: negative coryza, negative sore throat Resp: negative cough, hemoptysis, dyspnea Cards: negative for chest pain, palpitations, lower extremity edema GU: negative for frequency, dysuria and hematuria GI: Negative for abdominal pain, diarrhea, bleeding, constipation Skin: negative for rash and pruritus Heme: negative for easy bruising and gum/nose bleeding MS: negative for myalgias, arthralgias, back pain and muscle weakness Neurolo:negative for headaches, dizziness, vertigo, memory problems  Psych: negative for feelings of anxiety, depression  Endocrine: negative for thyroid, diabetes Allergy/Immunology- as above Objective:  VITALS:  There were no vitals taken for this visit. PHYSICAL EXAM:  General: Alert, , no distress, appears stated age. Oriented x5 Head: Normocephalic, without obvious abnormality, atraumatic. Able to move her neck freely Lungs: cannot be examined Heart: not examined Abdomen: non examined Extremities: atraumatic, no cyanosis. No edema. No clubbing Skin: No rashes or lesions. 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 During the surgery there was dural breach and that could have become contaminated later with the bacteria She is on cefepime and today is day 18. Doing very well- She wants to go home from [facility]. So will reduce her antibiotic by 2 days so that she can finish her antibiotic tomorrow and  PICC can be removed Saturday for home Labs from 07/29/21 reviewed with patient and daughter and looks good  Discussed the management with the patient and her daughter in great detail  Spent 25 min

## 2021-07-31 ENCOUNTER — Telehealth: Payer: Self-pay

## 2021-07-31 DIAGNOSIS — D649 Anemia, unspecified: Secondary | ICD-10-CM | POA: Diagnosis not present

## 2021-07-31 NOTE — Telephone Encounter (Signed)
Spoke to daughter regarding PULL PICC as she is upset that [facility] will not pull until Sunday due to discharge and attending unable to do this Saturday. Daughter Freda Munro states her mother is leaving as planned Saturday and will sign herself out PICC still in. I advised she needs to wait as she will have trouble getting this PICC removed if she does not discharge with facility. I also advised that thye may go over things that will help when she transitions home. She will try to talk to her mother and allow facility to discharge Sunday.

## 2021-08-03 DIAGNOSIS — G894 Chronic pain syndrome: Secondary | ICD-10-CM | POA: Diagnosis not present

## 2021-08-03 DIAGNOSIS — G009 Bacterial meningitis, unspecified: Secondary | ICD-10-CM | POA: Diagnosis not present

## 2021-08-03 DIAGNOSIS — M6281 Muscle weakness (generalized): Secondary | ICD-10-CM | POA: Diagnosis not present

## 2021-08-03 DIAGNOSIS — D509 Iron deficiency anemia, unspecified: Secondary | ICD-10-CM | POA: Diagnosis not present

## 2021-08-03 DIAGNOSIS — N1831 Chronic kidney disease, stage 3a: Secondary | ICD-10-CM | POA: Diagnosis not present

## 2021-08-04 DIAGNOSIS — Z8661 Personal history of infections of the central nervous system: Secondary | ICD-10-CM | POA: Diagnosis not present

## 2021-08-04 DIAGNOSIS — N1831 Chronic kidney disease, stage 3a: Secondary | ICD-10-CM | POA: Diagnosis not present

## 2021-08-04 DIAGNOSIS — I129 Hypertensive chronic kidney disease with stage 1 through stage 4 chronic kidney disease, or unspecified chronic kidney disease: Secondary | ICD-10-CM | POA: Diagnosis not present

## 2021-08-04 DIAGNOSIS — N3281 Overactive bladder: Secondary | ICD-10-CM | POA: Diagnosis not present

## 2021-08-07 DIAGNOSIS — M4316 Spondylolisthesis, lumbar region: Secondary | ICD-10-CM | POA: Diagnosis not present

## 2021-08-07 DIAGNOSIS — M47817 Spondylosis without myelopathy or radiculopathy, lumbosacral region: Secondary | ICD-10-CM | POA: Diagnosis not present

## 2021-08-07 DIAGNOSIS — M4807 Spinal stenosis, lumbosacral region: Secondary | ICD-10-CM | POA: Diagnosis not present

## 2021-08-07 DIAGNOSIS — M48061 Spinal stenosis, lumbar region without neurogenic claudication: Secondary | ICD-10-CM | POA: Diagnosis not present

## 2021-08-07 DIAGNOSIS — Z4789 Encounter for other orthopedic aftercare: Secondary | ICD-10-CM | POA: Diagnosis not present

## 2021-08-07 DIAGNOSIS — M5136 Other intervertebral disc degeneration, lumbar region: Secondary | ICD-10-CM | POA: Diagnosis not present

## 2021-08-07 DIAGNOSIS — M4317 Spondylolisthesis, lumbosacral region: Secondary | ICD-10-CM | POA: Diagnosis not present

## 2021-08-07 DIAGNOSIS — M5137 Other intervertebral disc degeneration, lumbosacral region: Secondary | ICD-10-CM | POA: Diagnosis not present

## 2021-08-07 DIAGNOSIS — M47816 Spondylosis without myelopathy or radiculopathy, lumbar region: Secondary | ICD-10-CM | POA: Diagnosis not present

## 2021-08-11 ENCOUNTER — Telehealth: Payer: Self-pay

## 2021-08-11 DIAGNOSIS — M5137 Other intervertebral disc degeneration, lumbosacral region: Secondary | ICD-10-CM | POA: Diagnosis not present

## 2021-08-11 DIAGNOSIS — M47816 Spondylosis without myelopathy or radiculopathy, lumbar region: Secondary | ICD-10-CM | POA: Diagnosis not present

## 2021-08-11 DIAGNOSIS — M4316 Spondylolisthesis, lumbar region: Secondary | ICD-10-CM | POA: Diagnosis not present

## 2021-08-11 DIAGNOSIS — Z4789 Encounter for other orthopedic aftercare: Secondary | ICD-10-CM | POA: Diagnosis not present

## 2021-08-11 DIAGNOSIS — M48061 Spinal stenosis, lumbar region without neurogenic claudication: Secondary | ICD-10-CM | POA: Diagnosis not present

## 2021-08-11 DIAGNOSIS — M4317 Spondylolisthesis, lumbosacral region: Secondary | ICD-10-CM | POA: Diagnosis not present

## 2021-08-11 DIAGNOSIS — M4807 Spinal stenosis, lumbosacral region: Secondary | ICD-10-CM | POA: Diagnosis not present

## 2021-08-11 DIAGNOSIS — M5136 Other intervertebral disc degeneration, lumbar region: Secondary | ICD-10-CM | POA: Diagnosis not present

## 2021-08-11 DIAGNOSIS — M47817 Spondylosis without myelopathy or radiculopathy, lumbosacral region: Secondary | ICD-10-CM | POA: Diagnosis not present

## 2021-08-11 NOTE — Telephone Encounter (Signed)
Spoke to Freda Munro (daughter) who reports patient is home and doing amazing. She is having weakness in eyes so she will see eye doctor 08/31/2021. She is doing PT in the home and today will be 2nd time. She has a good diet and is getting better every day.

## 2021-08-17 DIAGNOSIS — M4317 Spondylolisthesis, lumbosacral region: Secondary | ICD-10-CM | POA: Diagnosis not present

## 2021-08-17 DIAGNOSIS — M47816 Spondylosis without myelopathy or radiculopathy, lumbar region: Secondary | ICD-10-CM | POA: Diagnosis not present

## 2021-08-17 DIAGNOSIS — M4807 Spinal stenosis, lumbosacral region: Secondary | ICD-10-CM | POA: Diagnosis not present

## 2021-08-17 DIAGNOSIS — Z4789 Encounter for other orthopedic aftercare: Secondary | ICD-10-CM | POA: Diagnosis not present

## 2021-08-17 DIAGNOSIS — M47817 Spondylosis without myelopathy or radiculopathy, lumbosacral region: Secondary | ICD-10-CM | POA: Diagnosis not present

## 2021-08-17 DIAGNOSIS — M5137 Other intervertebral disc degeneration, lumbosacral region: Secondary | ICD-10-CM | POA: Diagnosis not present

## 2021-08-17 DIAGNOSIS — M5136 Other intervertebral disc degeneration, lumbar region: Secondary | ICD-10-CM | POA: Diagnosis not present

## 2021-08-17 DIAGNOSIS — M4316 Spondylolisthesis, lumbar region: Secondary | ICD-10-CM | POA: Diagnosis not present

## 2021-08-17 DIAGNOSIS — M48061 Spinal stenosis, lumbar region without neurogenic claudication: Secondary | ICD-10-CM | POA: Diagnosis not present

## 2021-08-19 DIAGNOSIS — I10 Essential (primary) hypertension: Secondary | ICD-10-CM | POA: Diagnosis not present

## 2021-08-19 DIAGNOSIS — Z0000 Encounter for general adult medical examination without abnormal findings: Secondary | ICD-10-CM | POA: Diagnosis not present

## 2021-08-19 DIAGNOSIS — I2699 Other pulmonary embolism without acute cor pulmonale: Secondary | ICD-10-CM | POA: Diagnosis not present

## 2021-08-19 DIAGNOSIS — K219 Gastro-esophageal reflux disease without esophagitis: Secondary | ICD-10-CM | POA: Diagnosis not present

## 2021-08-19 DIAGNOSIS — N2581 Secondary hyperparathyroidism of renal origin: Secondary | ICD-10-CM | POA: Diagnosis not present

## 2021-08-19 DIAGNOSIS — N3281 Overactive bladder: Secondary | ICD-10-CM | POA: Diagnosis not present

## 2021-08-19 DIAGNOSIS — E079 Disorder of thyroid, unspecified: Secondary | ICD-10-CM | POA: Diagnosis not present

## 2021-08-25 ENCOUNTER — Ambulatory Visit (INDEPENDENT_AMBULATORY_CARE_PROVIDER_SITE_OTHER): Payer: Medicare HMO | Admitting: Nurse Practitioner

## 2021-08-25 ENCOUNTER — Other Ambulatory Visit: Payer: Self-pay

## 2021-08-25 VITALS — BP 114/68 | HR 90 | Ht 61.0 in | Wt 180.0 lb

## 2021-08-25 DIAGNOSIS — I129 Hypertensive chronic kidney disease with stage 1 through stage 4 chronic kidney disease, or unspecified chronic kidney disease: Secondary | ICD-10-CM | POA: Diagnosis not present

## 2021-08-25 DIAGNOSIS — I771 Stricture of artery: Secondary | ICD-10-CM | POA: Diagnosis not present

## 2021-08-25 DIAGNOSIS — N183 Chronic kidney disease, stage 3 unspecified: Secondary | ICD-10-CM

## 2021-08-26 ENCOUNTER — Encounter (INDEPENDENT_AMBULATORY_CARE_PROVIDER_SITE_OTHER): Payer: Self-pay

## 2021-08-26 DIAGNOSIS — M5137 Other intervertebral disc degeneration, lumbosacral region: Secondary | ICD-10-CM | POA: Diagnosis not present

## 2021-08-26 DIAGNOSIS — M5136 Other intervertebral disc degeneration, lumbar region: Secondary | ICD-10-CM | POA: Diagnosis not present

## 2021-08-26 DIAGNOSIS — M4317 Spondylolisthesis, lumbosacral region: Secondary | ICD-10-CM | POA: Diagnosis not present

## 2021-08-26 DIAGNOSIS — M4316 Spondylolisthesis, lumbar region: Secondary | ICD-10-CM | POA: Diagnosis not present

## 2021-08-26 DIAGNOSIS — M48061 Spinal stenosis, lumbar region without neurogenic claudication: Secondary | ICD-10-CM | POA: Diagnosis not present

## 2021-08-26 DIAGNOSIS — Z4789 Encounter for other orthopedic aftercare: Secondary | ICD-10-CM | POA: Diagnosis not present

## 2021-08-26 DIAGNOSIS — M4807 Spinal stenosis, lumbosacral region: Secondary | ICD-10-CM | POA: Diagnosis not present

## 2021-08-26 DIAGNOSIS — M47816 Spondylosis without myelopathy or radiculopathy, lumbar region: Secondary | ICD-10-CM | POA: Diagnosis not present

## 2021-08-26 DIAGNOSIS — M47817 Spondylosis without myelopathy or radiculopathy, lumbosacral region: Secondary | ICD-10-CM | POA: Diagnosis not present

## 2021-08-28 ENCOUNTER — Other Ambulatory Visit: Payer: Medicare HMO

## 2021-08-28 ENCOUNTER — Inpatient Hospital Stay: Payer: Medicare HMO | Attending: Oncology | Admitting: Oncology

## 2021-08-28 ENCOUNTER — Inpatient Hospital Stay: Payer: Medicare HMO

## 2021-08-28 ENCOUNTER — Encounter: Payer: Self-pay | Admitting: Oncology

## 2021-08-28 ENCOUNTER — Other Ambulatory Visit: Payer: Self-pay

## 2021-08-28 VITALS — BP 103/62 | HR 69 | Temp 97.9°F | Resp 16 | Wt 181.4 lb

## 2021-08-28 DIAGNOSIS — Z4789 Encounter for other orthopedic aftercare: Secondary | ICD-10-CM | POA: Diagnosis not present

## 2021-08-28 DIAGNOSIS — Z86711 Personal history of pulmonary embolism: Secondary | ICD-10-CM | POA: Diagnosis not present

## 2021-08-28 DIAGNOSIS — M5137 Other intervertebral disc degeneration, lumbosacral region: Secondary | ICD-10-CM | POA: Diagnosis not present

## 2021-08-28 DIAGNOSIS — E039 Hypothyroidism, unspecified: Secondary | ICD-10-CM | POA: Insufficient documentation

## 2021-08-28 DIAGNOSIS — Z79899 Other long term (current) drug therapy: Secondary | ICD-10-CM | POA: Insufficient documentation

## 2021-08-28 DIAGNOSIS — N189 Chronic kidney disease, unspecified: Secondary | ICD-10-CM | POA: Insufficient documentation

## 2021-08-28 DIAGNOSIS — Z7901 Long term (current) use of anticoagulants: Secondary | ICD-10-CM | POA: Insufficient documentation

## 2021-08-28 DIAGNOSIS — M47816 Spondylosis without myelopathy or radiculopathy, lumbar region: Secondary | ICD-10-CM | POA: Diagnosis not present

## 2021-08-28 DIAGNOSIS — M4316 Spondylolisthesis, lumbar region: Secondary | ICD-10-CM | POA: Diagnosis not present

## 2021-08-28 DIAGNOSIS — D508 Other iron deficiency anemias: Secondary | ICD-10-CM

## 2021-08-28 DIAGNOSIS — E860 Dehydration: Secondary | ICD-10-CM | POA: Diagnosis not present

## 2021-08-28 DIAGNOSIS — D509 Iron deficiency anemia, unspecified: Secondary | ICD-10-CM | POA: Diagnosis not present

## 2021-08-28 DIAGNOSIS — E876 Hypokalemia: Secondary | ICD-10-CM

## 2021-08-28 DIAGNOSIS — M5136 Other intervertebral disc degeneration, lumbar region: Secondary | ICD-10-CM | POA: Diagnosis not present

## 2021-08-28 DIAGNOSIS — M48061 Spinal stenosis, lumbar region without neurogenic claudication: Secondary | ICD-10-CM | POA: Diagnosis not present

## 2021-08-28 DIAGNOSIS — M4807 Spinal stenosis, lumbosacral region: Secondary | ICD-10-CM | POA: Diagnosis not present

## 2021-08-28 DIAGNOSIS — M4317 Spondylolisthesis, lumbosacral region: Secondary | ICD-10-CM | POA: Diagnosis not present

## 2021-08-28 DIAGNOSIS — M47817 Spondylosis without myelopathy or radiculopathy, lumbosacral region: Secondary | ICD-10-CM | POA: Diagnosis not present

## 2021-08-28 LAB — CBC WITH DIFFERENTIAL/PLATELET
Abs Immature Granulocytes: 0.04 10*3/uL (ref 0.00–0.07)
Basophils Absolute: 0.1 10*3/uL (ref 0.0–0.1)
Basophils Relative: 1 %
Eosinophils Absolute: 0.1 10*3/uL (ref 0.0–0.5)
Eosinophils Relative: 1 %
HCT: 32.7 % — ABNORMAL LOW (ref 36.0–46.0)
Hemoglobin: 11.4 g/dL — ABNORMAL LOW (ref 12.0–15.0)
Immature Granulocytes: 1 %
Lymphocytes Relative: 35 %
Lymphs Abs: 2.9 10*3/uL (ref 0.7–4.0)
MCH: 30.7 pg (ref 26.0–34.0)
MCHC: 34.9 g/dL (ref 30.0–36.0)
MCV: 88.1 fL (ref 80.0–100.0)
Monocytes Absolute: 0.6 10*3/uL (ref 0.1–1.0)
Monocytes Relative: 8 %
Neutro Abs: 4.6 10*3/uL (ref 1.7–7.7)
Neutrophils Relative %: 54 %
Platelets: 330 10*3/uL (ref 150–400)
RBC: 3.71 MIL/uL — ABNORMAL LOW (ref 3.87–5.11)
RDW: 13.6 % (ref 11.5–15.5)
WBC: 8.3 10*3/uL (ref 4.0–10.5)
nRBC: 0 % (ref 0.0–0.2)

## 2021-08-28 LAB — COMPREHENSIVE METABOLIC PANEL
ALT: 13 U/L (ref 0–44)
AST: 17 U/L (ref 15–41)
Albumin: 4 g/dL (ref 3.5–5.0)
Alkaline Phosphatase: 71 U/L (ref 38–126)
Anion gap: 11 (ref 5–15)
BUN: 17 mg/dL (ref 8–23)
CO2: 27 mmol/L (ref 22–32)
Calcium: 10.1 mg/dL (ref 8.9–10.3)
Chloride: 96 mmol/L — ABNORMAL LOW (ref 98–111)
Creatinine, Ser: 2.01 mg/dL — ABNORMAL HIGH (ref 0.44–1.00)
GFR, Estimated: 25 mL/min — ABNORMAL LOW (ref >60)
Glucose, Bld: 110 mg/dL — ABNORMAL HIGH (ref 70–99)
Potassium: 3 mmol/L — ABNORMAL LOW (ref 3.5–5.1)
Sodium: 134 mmol/L — ABNORMAL LOW (ref 135–145)
Total Bilirubin: 0.6 mg/dL (ref 0.3–1.2)
Total Protein: 7.7 g/dL (ref 6.5–8.1)

## 2021-08-28 LAB — IRON AND TIBC
Iron: 58 ug/dL (ref 28–170)
Saturation Ratios: 18 % (ref 10.4–31.8)
TIBC: 321 ug/dL (ref 250–450)
UIBC: 263 ug/dL

## 2021-08-28 LAB — FERRITIN: Ferritin: 75 ng/mL (ref 11–307)

## 2021-08-28 NOTE — Progress Notes (Signed)
Hematology/Oncology Consult note [facility]  [phone] [phone]  Patient Care Team: [name], MD as PCP - General (Family Medicine) Sindy Guadeloupe, MD as Consulting Physician (Oncology)   Name of the patient: Latoya Freeman  [id number]  January 24, 1941   Date of visit: 08/31/21  Diagnosis- 1.  History of unprovoked PE on Eliquis 2.  Anemia likely due to iron deficiency  Chief complaint/ Reason for visit-routine follow-up of being anemia  Heme/Onc history:  Patient is a 80 year old female who was seen by me in June 2020.  She was found to have unprovoked PE in December 2019 and has been on Eliquis since then.  She has now been referred to me for anemia.  Most recent CBC from 07/20/2020 showed H&H of 9.7/29 with an MCV of 83 ferritin levels were low at 12 and iron saturation low at 14%.  Patient has been following up with Dr. 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  She was evaluated by vascular surgery at [facility] for possible intervention of this ligament and subsequently underwent surgery with resolution of symptoms  Interval history-Latoya Freeman is a 80 year old female who presents today for follow-up.  She continues Plavix and Eliquis and denies any bleeding.  Patient recently recovered from meningitis after back surgery.  She was hospitalized from 07/12/2021-07/21/2021 for the worst headache of her life and altered mental status.  She was treated with IV antibiotics and required 3 additional weeks of outpatient antibiotics.  She was sent to a skilled nursing facility.    She has been having diarrhea several episodes per day thought to be due to severe celiac artery stenosis.  She is  scheduled for stent replacement with Dr. Lucky Cowboy on 09/03/2021.  She has not been taking any medication to help with her diarrhea.  ECOG PS- 1 Pain scale- 4   Review of systems- Review of Systems  Constitutional: Negative.  Negative for chills, fever, malaise/fatigue and weight loss.  HENT:  Negative for congestion, ear pain and tinnitus.   Eyes: Negative.  Negative for blurred vision and double vision.  Respiratory: Negative.  Negative for cough, sputum production and shortness of breath.   Cardiovascular: Negative.  Negative for chest pain, palpitations and leg swelling.  Gastrointestinal:  Positive for diarrhea and nausea. Negative for abdominal pain, constipation and vomiting.  Genitourinary:  Negative for dysuria, frequency and urgency.  Musculoskeletal:  Negative for back pain and falls.  Skin: Negative.  Negative for rash.  Neurological: Negative.  Negative for weakness and headaches.  Endo/Heme/Allergies: Negative.  Does not bruise/bleed easily.  Psychiatric/Behavioral: Negative.  Negative for depression. The patient is not nervous/anxious and does not have insomnia.      Allergies  Allergen Reactions   Bee Venom Swelling   Shellfish Allergy Swelling   Tamoxifen Hives   Penicillins Hives, Rash and Other (See Comments)    Has patient had a PCN reaction causing immediate rash, facial/tongue/throat swelling, SOB or lightheadedness with hypotension: Unknown Has patient had a PCN reaction causing severe rash involving mucus membranes or skin necrosis: Unknown Has patient had a PCN reaction that required hospitalization: Unknown Has patient had a PCN reaction occurring within the last 10 years: No If all of the above answers are "NO", then may proceed  with Cephalosporin use.      Past Medical History:  Diagnosis Date   Aneurysm ([place]) 1980   Arthritis    Cancer ([place])    GERD (gastroesophageal reflux disease)    Hypertension 1980   Hypothyroidism 1999   Pneumonia    Pulmonary  embolism ([place])    Shingles    Wears dentures    full upper and lower     Past Surgical History:  Procedure Laterality Date   BRAIN SURGERY     aneurysm   BREAST CYST ASPIRATION Left 2005   BREAST EXCISIONAL BIOPSY Left 2013   atypical ductal hyperplasia   BREAST SURGERY  2013   LF Breast Wide EXC    CHOLECYSTECTOMY  2004   COLONOSCOPY     COLONOSCOPY WITH PROPOFOL N/A 01/31/2018   Procedure: COLONOSCOPY WITH PROPOFOL;  Surgeon: Lollie Sails, MD;  Location: [facility] ENDOSCOPY;  Service: Endoscopy;  Laterality: N/A;   COLONOSCOPY WITH PROPOFOL N/A 08/06/2020   Procedure: COLONOSCOPY WITH PROPOFOL;  Surgeon: Virgel Manifold, MD;  Location: [facility] ENDOSCOPY;  Service: Endoscopy;  Laterality: N/A;   DILATION AND CURETTAGE OF UTERUS N/A 05/03/2019   Procedure: DILATATION AND CURETTAGE;  Surgeon: Gae Dry, MD;  Location: [facility] ORS;  Service: Gynecology;  Laterality: N/A;   ESOPHAGOGASTRODUODENOSCOPY N/A 01/31/2018   Procedure: ESOPHAGOGASTRODUODENOSCOPY (EGD);  Surgeon: Lollie Sails, MD;  Location: [facility] ENDOSCOPY;  Service: Endoscopy;  Laterality: N/A;   ESOPHAGOGASTRODUODENOSCOPY (EGD) WITH PROPOFOL N/A 08/06/2020   Procedure: ESOPHAGOGASTRODUODENOSCOPY (EGD) WITH PROPOFOL;  Surgeon: Virgel Manifold, MD;  Location: [facility] ENDOSCOPY;  Service: Endoscopy;  Laterality: N/A;   LUMBAR LAMINECTOMY/DECOMPRESSION MICRODISCECTOMY N/A 07/01/2021   Procedure: LEFT L3-4 MICRODSICECTOMY, L4-S1 DECOMPRESSION;  Surgeon: Meade Maw, MD;  Location: [facility] ORS;  Service: Neurosurgery;  Laterality: N/A;   SHOULDER ARTHROSCOPY Left 07/25/2018   Procedure: SHOULDER MINI OPEN ROTATOR CUFF REPAIR  BICEPS TENDOSIS ARTHROSCOPIC DISTAL CLAVICLE EXCISION  SUBACROMIAL DECOMP;  Surgeon: Leim Fabry, MD;  Location: [place];  Service: Orthopedics;  Laterality: Left;  [facility] WITH SPYDER SMITH & NEPHEW HEAD COIL ANCHOR FOOTPRINT ANCHOR QFIX ANCHOR   VISCERAL ANGIOGRAPHY N/A 08/14/2020    Procedure: VISCERAL ANGIOGRAPHY;  Surgeon: Algernon Huxley, MD;  Location: [place] CV LAB;  Service: Cardiovascular;  Laterality: N/A;    Social History   Socioeconomic History   Marital status: Divorced    Spouse name: Not on file   Number of children: Not on file   Years of education: Not on file   Highest education level: Not on file  Occupational History   Not on file  Tobacco Use   Smoking status: Former    Packs/day: 1.50    Years: 20.00    Pack years: 30.00    Types: Cigarettes    Quit date: 44    Years since quitting: 32.7   Smokeless tobacco: Never  Vaping Use   Vaping Use: Never used  Substance and Sexual Activity   Alcohol use: No   Drug use: No   Sexual activity: Not on file  Other Topics Concern   Not on file  Social History Narrative   Lives alone   Social Determinants of Health   Financial Resource Strain: Not on file  Food Insecurity: Not on file  Transportation Needs: Not on file  Physical Activity: Not on file  Stress: Not on file  Social Connections: Not on file  Intimate Partner Violence: Not on file    Family History  Problem Relation  Age of Onset   Breast cancer Neg Hx      Current Outpatient Medications:    acetaminophen (TYLENOL) 325 MG tablet, Take 2 tablets (650 mg total) by mouth every 6 (six) hours as needed for mild pain, moderate pain, fever or headache (or Fever >/= 101)., Disp: , Rfl:    amLODipine (NORVASC) 5 MG tablet, Take 5 mg by mouth daily., Disp: , Rfl:    apixaban (ELIQUIS) 5 MG TABS tablet, Take 5 mg by mouth 2 (two) times daily., Disp: , Rfl:    bisacodyl (DULCOLAX) 5 MG EC tablet, Take 1 tablet (5 mg total) by mouth daily as needed for moderate constipation., Disp: 30 tablet, Rfl: 0   cetirizine (ZYRTEC) 10 MG tablet, Take 10 mg by mouth daily as needed for allergies., Disp: , Rfl:    citalopram (CELEXA) 20 MG tablet, Take 20 mg by mouth daily., Disp: , Rfl:    clopidogrel (PLAVIX) 75 MG tablet, Take 75 mg by  mouth daily., Disp: , Rfl:    levothyroxine (SYNTHROID, LEVOTHROID) 75 MCG tablet, Take 75 mcg by mouth daily., Disp: , Rfl:    losartan (COZAAR) 50 MG tablet, Take 50 mg by mouth daily. , Disp: , Rfl:    omeprazole (PRILOSEC) 20 MG capsule, Take 20 mg by mouth 2 (two) times daily., Disp: , Rfl:    potassium chloride SA (KLOR-CON) 20 MEQ tablet, Take 1 tablet (20 mEq total) by mouth 2 (two) times daily., Disp: 14 tablet, Rfl: 0   solifenacin (VESICARE) 5 MG tablet, Take 5 mg by mouth daily., Disp: , Rfl:    triamterene-hydrochlorothiazide (MAXZIDE-25) 37.5-25 MG tablet, Take 1 tablet by mouth daily., Disp: , Rfl:    HYDROcodone-acetaminophen (NORCO/VICODIN) 5-325 MG tablet, Take 1 tablet by mouth every 6 (six) hours as needed for moderate pain or severe pain. (Patient not taking: Reported on 08/28/2021), Disp: 30 tablet, Rfl: 0   senna (SENOKOT) 8.6 MG TABS tablet, Take 1 tablet (8.6 mg total) by mouth 2 (two) times daily. (Patient not taking: Reported on 08/28/2021), Disp: 120 tablet, Rfl: 0  Physical exam:  Vitals:   08/28/21 1450  BP: 103/62  Pulse: 69  Resp: 16  Temp: 97.9 F (36.6 C)  Weight: 181 lb 6.4 oz (82.3 kg)   Physical Exam Constitutional:      Appearance: Normal appearance.  HENT:     Head: Normocephalic and atraumatic.  Eyes:     Pupils: Pupils are equal, round, and reactive to light.  Cardiovascular:     Rate and Rhythm: Normal rate and regular rhythm.     Heart sounds: Normal heart sounds. No murmur heard. Pulmonary:     Effort: Pulmonary effort is normal.     Breath sounds: Normal breath sounds. No wheezing.  Abdominal:     General: Bowel sounds are normal. There is no distension.     Palpations: Abdomen is soft.     Tenderness: There is no abdominal tenderness.  Musculoskeletal:        General: Normal range of motion.     Cervical back: Normal range of motion.  Skin:    General: Skin is warm and dry.     Findings: No rash.  Neurological:     Mental Status:  She is alert and oriented to person, place, and time.  Psychiatric:        Judgment: Judgment normal.     CMP Latest Ref Rng & Units 08/28/2021  Glucose 70 - 99 mg/dL 110(H)  BUN 8 - 23 mg/dL 17  Creatinine 0.44 - 1.00 mg/dL 2.01(H)  Sodium 135 - 145 mmol/L 134(L)  Potassium 3.5 - 5.1 mmol/L 3.0(L)  Chloride 98 - 111 mmol/L 96(L)  CO2 22 - 32 mmol/L 27  Calcium 8.7 - 10.3 mg/dL 10.1  Total Protein 6.5 - 8.1 g/dL 7.7  Total Bilirubin 0.3 - 1.2 mg/dL 0.6  Alkaline Phos 38 - 126 U/L 71  AST 15 - 41 U/L 17  ALT 0 - 44 U/L 13   CBC Latest Ref Rng & Units 08/28/2021  WBC 4.0 - 10.5 K/uL 8.3  Hemoglobin 12.0 - 15.0 g/dL 11.4(L)  Hematocrit 36.0 - 46.0 % 32.7(L)  Platelets 150 - 400 K/uL 330      Assessment and plan- Patient is a 80 y.o. female with history of unprovoked PE on Eliquis and iron deficiency anemia here for routine follow-up.   Iron deficiency anemia- She last received IV Venofer on 03/16/2021 and 03/18/2021.  Labs from 08/28/2021 show hemoglobin of 11.4 (13.2), ferritin 75 and iron saturation 18%.  Creatinine trended up to 2.01.  Potassium is low at 3.0.  Bacterial meningitis- Was hospitalized from 07/12/2021-07/21/2021 for headache prompting evaluation and lumbar puncture revealing bacterial meningitis.  She recently had had back surgery. She was discharged to a SNF for additional IV antibiotics. She continues to recover.  Hypokalemia- Secondary to diuretics and diarrhea.  Recommend potassium replacement 20 mEq twice daily x7 days.  Prescription sent.  Elevated creatinine-does have history of CKD.  Likely exacerbated by dehydration and diarrhea.  Increase fluids and will repeat labs in the next few weeks.  History of unprovoked PE-continue Eliquis.  Denies any bleeding.  Severe arthrosclerotic changes of the mesenteric arteries- She has history of stenosis of iliac artery and is followed by vein and vascular.  She is scheduled to have    This is causing diarrhea.   Colonoscopy was nondiagnostic.   Disposition- RTC in 3 months for repeat labs see Dr. Janese Banks and possible IV iron.  I spent 25 minutes dedicated to the care of this patient (face-to-face and non-face-to-face) on the date of the encounter to include what is described in the assessment and plan.   Visit Diagnosis 1. Iron deficiency anemia, unspecified iron deficiency anemia type   2. Dehydration   3. Hypokalemia     Faythe Casa, NP 08/31/2021 12:57 PM

## 2021-08-28 NOTE — Progress Notes (Signed)
Patient denies new problems/concerns today.  Did have back surgery a couple of weeks ago.

## 2021-08-29 LAB — PTH, INTACT AND CALCIUM
Calcium, Total (PTH): 10.2 mg/dL (ref 8.7–10.3)
PTH: 18 pg/mL (ref 15–65)

## 2021-08-31 ENCOUNTER — Encounter (INDEPENDENT_AMBULATORY_CARE_PROVIDER_SITE_OTHER): Payer: Self-pay | Admitting: Nurse Practitioner

## 2021-08-31 ENCOUNTER — Encounter: Payer: Self-pay | Admitting: Oncology

## 2021-08-31 DIAGNOSIS — H40003 Preglaucoma, unspecified, bilateral: Secondary | ICD-10-CM | POA: Diagnosis not present

## 2021-08-31 LAB — KAPPA/LAMBDA LIGHT CHAINS
Kappa free light chain: 54.2 mg/L — ABNORMAL HIGH (ref 3.3–19.4)
Kappa, lambda light chain ratio: 2.04 — ABNORMAL HIGH (ref 0.26–1.65)
Lambda free light chains: 26.6 mg/L — ABNORMAL HIGH (ref 5.7–26.3)

## 2021-08-31 MED ORDER — POTASSIUM CHLORIDE CRYS ER 20 MEQ PO TBCR: 20.0000 meq | EXTENDED_RELEASE_TABLET | Freq: Two times a day (BID) | ORAL | 0 refills | Status: DC

## 2021-08-31 NOTE — Progress Notes (Signed)
Subjective:    Patient ID: Latoya Freeman, female    DOB: January 29, 1941, 80 y.o.   MRN: [id number] Chief Complaint  Patient presents with   Follow-up    Last seen JD on 9/21 Consult ( [place] result from 05/04/21 in referral packet from outside facility)    Latoya Freeman is a 80 year old female that presents today as a referral from Dr. Teola Bradley concerning possible celiac artery stenosis.  Patient has previously been treated with a stent to her celiac artery years ago.  The patient also underwent median arcuate ligament surgery the patient has noted some no weight loss but significant nausea.  The patient does want substantiate food fear, particular foods do not seem to aggravate or alleviate the symptoms.  The patient denies bloody bowel movements but endorses diarrhea.  The patient has a history of colonoscopy which was not diagnostic.  No history of peptic ulcer disease.   No prior peripheral angiograms or vascular interventions.  The patient denies amaurosis fugax or recent TIA symptoms. There are no recent neurological changes noted. The patient denies claudication symptoms or rest pain symptoms. The patient denies history of DVT, PE or superficial thrombophlebitis. The patient denies recent episodes of angina    Noninvasive studies done at Dr. Unk Lightning office show a 60-70% stenosis of her celiac artery.  Velocities are seen within the mesenteric     Review of Systems  Gastrointestinal:  Positive for diarrhea and nausea.  Neurological:  Positive for dizziness.  All other systems reviewed and are negative.     Objective:   Physical Exam Vitals reviewed.  HENT:     Head: Normocephalic.  Cardiovascular:     Rate and Rhythm: Normal rate.     Pulses: Normal pulses.  Pulmonary:     Effort: Pulmonary effort is normal.  Neurological:     Mental Status: She is alert and oriented to person, place, and time.  Psychiatric:        Mood and Affect: Mood normal.        Behavior: Behavior  normal.        Thought Content: Thought content normal.        Judgment: Judgment normal.    BP 114/68   Pulse 90   Ht 5\' 1"  (1.549 m)   Wt 180 lb (81.6 kg)   BMI 34.01 kg/m   Past Medical History:  Diagnosis Date   Aneurysm ([place]) 1980   Arthritis    Cancer ([place])    GERD (gastroesophageal reflux disease)    Hypertension 1980   Hypothyroidism 1999   Pneumonia    Pulmonary embolism (HCC)    Shingles    Wears dentures    full upper and lower    Social History   Socioeconomic History   Marital status: Divorced    Spouse name: Not on file   Number of children: Not on file   Years of education: Not on file   Highest education level: Not on file  Occupational History   Not on file  Tobacco Use   Smoking status: Former    Packs/day: 1.50    Years: 20.00    Pack years: 30.00    Types: Cigarettes    Quit date: 21    Years since quitting: 32.7   Smokeless tobacco: Never  Vaping Use   Vaping Use: Never used  Substance and Sexual Activity   Alcohol use: No   Drug use: No   Sexual activity: Not on file  Other  Topics Concern   Not on file  Social History Narrative   Lives alone   Social Determinants of Health   Financial Resource Strain: Not on file  Food Insecurity: Not on file  Transportation Needs: Not on file  Physical Activity: Not on file  Stress: Not on file  Social Connections: Not on file  Intimate Partner Violence: Not on file    Past Surgical History:  Procedure Laterality Date   BRAIN SURGERY     aneurysm   BREAST CYST ASPIRATION Left 2005   BREAST EXCISIONAL BIOPSY Left 2013   atypical ductal hyperplasia   BREAST SURGERY  2013   LF Breast Wide EXC    CHOLECYSTECTOMY  2004   COLONOSCOPY     COLONOSCOPY WITH PROPOFOL N/A 01/31/2018   Procedure: COLONOSCOPY WITH PROPOFOL;  Surgeon: Lollie Sails, MD;  Location: [facility] ENDOSCOPY;  Service: Endoscopy;  Laterality: N/A;   COLONOSCOPY WITH PROPOFOL N/A 08/06/2020   Procedure: COLONOSCOPY  WITH PROPOFOL;  Surgeon: Virgel Manifold, MD;  Location: [facility] ENDOSCOPY;  Service: Endoscopy;  Laterality: N/A;   DILATION AND CURETTAGE OF UTERUS N/A 05/03/2019   Procedure: DILATATION AND CURETTAGE;  Surgeon: Gae Dry, MD;  Location: [facility] ORS;  Service: Gynecology;  Laterality: N/A;   ESOPHAGOGASTRODUODENOSCOPY N/A 01/31/2018   Procedure: ESOPHAGOGASTRODUODENOSCOPY (EGD);  Surgeon: Lollie Sails, MD;  Location: [facility] ENDOSCOPY;  Service: Endoscopy;  Laterality: N/A;   ESOPHAGOGASTRODUODENOSCOPY (EGD) WITH PROPOFOL N/A 08/06/2020   Procedure: ESOPHAGOGASTRODUODENOSCOPY (EGD) WITH PROPOFOL;  Surgeon: Virgel Manifold, MD;  Location: [facility] ENDOSCOPY;  Service: Endoscopy;  Laterality: N/A;   LUMBAR LAMINECTOMY/DECOMPRESSION MICRODISCECTOMY N/A 07/01/2021   Procedure: LEFT L3-4 MICRODSICECTOMY, L4-S1 DECOMPRESSION;  Surgeon: Meade Maw, MD;  Location: [facility] ORS;  Service: Neurosurgery;  Laterality: N/A;   SHOULDER ARTHROSCOPY Left 07/25/2018   Procedure: SHOULDER MINI OPEN ROTATOR CUFF REPAIR  BICEPS TENDOSIS ARTHROSCOPIC DISTAL CLAVICLE EXCISION  SUBACROMIAL DECOMP;  Surgeon: Leim Fabry, MD;  Location: [place];  Service: Orthopedics;  Laterality: Left;  [facility] WITH SPYDER SMITH & NEPHEW HEAD COIL ANCHOR FOOTPRINT ANCHOR QFIX ANCHOR   VISCERAL ANGIOGRAPHY N/A 08/14/2020   Procedure: VISCERAL ANGIOGRAPHY;  Surgeon: Algernon Huxley, MD;  Location: [place] CV LAB;  Service: Cardiovascular;  Laterality: N/A;    Family History  Problem Relation Age of Onset   Breast cancer Neg Hx     Allergies  Allergen Reactions   Bee Venom Swelling   Shellfish Allergy Swelling   Tamoxifen Hives   Penicillins Hives, Rash and Other (See Comments)    Has patient had a PCN reaction causing immediate rash, facial/tongue/throat swelling, SOB or lightheadedness with hypotension: Unknown Has patient had a PCN reaction causing severe rash involving mucus membranes or skin  necrosis: Unknown Has patient had a PCN reaction that required hospitalization: Unknown Has patient had a PCN reaction occurring within the last 10 years: No If all of the above answers are "NO", then may proceed with Cephalosporin use.     CBC Latest Ref Rng & Units 08/28/2021 07/20/2021 07/15/2021  WBC 4.0 - 10.5 K/uL 8.3 8.6 14.0(H)  Hemoglobin 12.0 - 15.0 g/dL 11.4(L) 13.2 11.0(L)  Hematocrit 36.0 - 46.0 % 32.7(L) 37.3 31.7(L)  Platelets 150 - 400 K/uL 330 312 223      CMP     Component Value Date/Time   NA 134 (L) 08/28/2021 1516   K 3.0 (L) 08/28/2021 1516   K 3.0 (L) 02/14/2014 1447   CL 96 (L) 08/28/2021 1516  CO2 27 08/28/2021 1516   GLUCOSE 110 (H) 08/28/2021 1516   BUN 17 08/28/2021 1516   CREATININE 2.01 (H) 08/28/2021 1516   CALCIUM 10.2 08/28/2021 1516   CALCIUM 10.1 08/28/2021 1516   PROT 7.7 08/28/2021 1516   ALBUMIN 4.0 08/28/2021 1516   AST 17 08/28/2021 1516   ALT 13 08/28/2021 1516   ALKPHOS 71 08/28/2021 1516   BILITOT 0.6 08/28/2021 1516   GFRNONAA 25 (L) 08/28/2021 1516   GFRAA 38 (L) 08/22/2020 1210     No results found.     Assessment & Plan:   1. Celiac artery stenosis (HCC) Recommend:  The patient has evidence of severe atherosclerotic changes of the mesenteric arteries associated with weight loss as well as abdominal pain and N/V.  This represents a high risk for bowel infarction and death.  Patient should undergo angiography of the mesenteric arteries with the hope for intervention to eliminate the ischemic symptoms.    The risks and benefits as well as the alternative therapies was discussed in detail with the patient.  All questions were answered.  Patient agrees to proceed with angiography and intervention.  The patient will follow up with me after the angiogram.   2. Benign hypertension with CKD (chronic kidney disease) stage III (HCC) Continue antihypertensive medications as already ordered, these medications have been reviewed  and there are no changes at this time.    Current Outpatient Medications on File Prior to Visit  Medication Sig Dispense Refill   acetaminophen (TYLENOL) 325 MG tablet Take 2 tablets (650 mg total) by mouth every 6 (six) hours as needed for mild pain, moderate pain, fever or headache (or Fever >/= 101).     amLODipine (NORVASC) 5 MG tablet Take 5 mg by mouth daily.     apixaban (ELIQUIS) 5 MG TABS tablet Take 5 mg by mouth 2 (two) times daily.     bisacodyl (DULCOLAX) 5 MG EC tablet Take 1 tablet (5 mg total) by mouth daily as needed for moderate constipation. 30 tablet 0   cetirizine (ZYRTEC) 10 MG tablet Take 10 mg by mouth daily as needed for allergies.     citalopram (CELEXA) 20 MG tablet Take 20 mg by mouth daily.     clopidogrel (PLAVIX) 75 MG tablet Take 75 mg by mouth daily.     HYDROcodone-acetaminophen (NORCO/VICODIN) 5-325 MG tablet Take 1 tablet by mouth every 6 (six) hours as needed for moderate pain or severe pain. (Patient not taking: Reported on 08/28/2021) 30 tablet 0   levothyroxine (SYNTHROID, LEVOTHROID) 75 MCG tablet Take 75 mcg by mouth daily.     losartan (COZAAR) 50 MG tablet Take 50 mg by mouth daily.      omeprazole (PRILOSEC) 20 MG capsule Take 20 mg by mouth 2 (two) times daily.     senna (SENOKOT) 8.6 MG TABS tablet Take 1 tablet (8.6 mg total) by mouth 2 (two) times daily. (Patient not taking: Reported on 08/28/2021) 120 tablet 0   triamterene-hydrochlorothiazide (MAXZIDE-25) 37.5-25 MG tablet Take 1 tablet by mouth daily.     No current facility-administered medications on file prior to visit.    There are no Patient Instructions on file for this visit. No follow-ups on file.   Kris Hartmann, NP

## 2021-08-31 NOTE — H&P (View-Only) (Signed)
Subjective:    Patient ID: Latoya Freeman, female    DOB: 19-May-1941, 80 y.o.   MRN: [id number] Chief Complaint  Patient presents with   Follow-up    Last seen JD on 9/21 Consult ( [place] result from 05/04/21 in referral packet from outside facility)    Latoya Freeman is a 80 year old female that presents today as a referral from Dr. Teola Bradley concerning possible celiac artery stenosis.  Patient has previously been treated with a stent to her celiac artery years ago.  The patient also underwent median arcuate ligament surgery the patient has noted some no weight loss but significant nausea.  The patient does want substantiate food fear, particular foods do not seem to aggravate or alleviate the symptoms.  The patient denies bloody bowel movements but endorses diarrhea.  The patient has a history of colonoscopy which was not diagnostic.  No history of peptic ulcer disease.   No prior peripheral angiograms or vascular interventions.  The patient denies amaurosis fugax or recent TIA symptoms. There are no recent neurological changes noted. The patient denies claudication symptoms or rest pain symptoms. The patient denies history of DVT, PE or superficial thrombophlebitis. The patient denies recent episodes of angina    Noninvasive studies done at Dr. Unk Lightning office show a 60-70% stenosis of her celiac artery.  Velocities are seen within the mesenteric     Review of Systems  Gastrointestinal:  Positive for diarrhea and nausea.  Neurological:  Positive for dizziness.  All other systems reviewed and are negative.     Objective:   Physical Exam Vitals reviewed.  HENT:     Head: Normocephalic.  Cardiovascular:     Rate and Rhythm: Normal rate.     Pulses: Normal pulses.  Pulmonary:     Effort: Pulmonary effort is normal.  Neurological:     Mental Status: She is alert and oriented to person, place, and time.  Psychiatric:        Mood and Affect: Mood normal.        Behavior: Behavior  normal.        Thought Content: Thought content normal.        Judgment: Judgment normal.    BP 114/68   Pulse 90   Ht 5\' 1"  (1.549 m)   Wt 180 lb (81.6 kg)   BMI 34.01 kg/m   Past Medical History:  Diagnosis Date   Aneurysm ([place]) 1980   Arthritis    Cancer ([place])    GERD (gastroesophageal reflux disease)    Hypertension 1980   Hypothyroidism 1999   Pneumonia    Pulmonary embolism (HCC)    Shingles    Wears dentures    full upper and lower    Social History   Socioeconomic History   Marital status: Divorced    Spouse name: Not on file   Number of children: Not on file   Years of education: Not on file   Highest education level: Not on file  Occupational History   Not on file  Tobacco Use   Smoking status: Former    Packs/day: 1.50    Years: 20.00    Pack years: 30.00    Types: Cigarettes    Quit date: 50    Years since quitting: 32.7   Smokeless tobacco: Never  Vaping Use   Vaping Use: Never used  Substance and Sexual Activity   Alcohol use: No   Drug use: No   Sexual activity: Not on file  Other  Topics Concern   Not on file  Social History Narrative   Lives alone   Social Determinants of Health   Financial Resource Strain: Not on file  Food Insecurity: Not on file  Transportation Needs: Not on file  Physical Activity: Not on file  Stress: Not on file  Social Connections: Not on file  Intimate Partner Violence: Not on file    Past Surgical History:  Procedure Laterality Date   BRAIN SURGERY     aneurysm   BREAST CYST ASPIRATION Left 2005   BREAST EXCISIONAL BIOPSY Left 2013   atypical ductal hyperplasia   BREAST SURGERY  2013   LF Breast Wide EXC    CHOLECYSTECTOMY  2004   COLONOSCOPY     COLONOSCOPY WITH PROPOFOL N/A 01/31/2018   Procedure: COLONOSCOPY WITH PROPOFOL;  Surgeon: Lollie Sails, MD;  Location[facility] ENDOSCOPY;  Service: Endoscopy;  Laterality: N/A;   COLONOSCOPY WITH PROPOFOL N/A 08/06/2020   Procedure: COLONOSCOPY  WITH PROPOFOL;  Surgeon: Virgel Manifold, MD;  Location: [facility] ENDOSCOPY;  Service: Endoscopy;  Laterality: N/A;   DILATION AND CURETTAGE OF UTERUS N/A 05/03/2019   Procedure: DILATATION AND CURETTAGE;  Surgeon: Gae Dry, MD;  Location: [facility] ORS;  Service: Gynecology;  Laterality: N/A;   ESOPHAGOGASTRODUODENOSCOPY N/A 01/31/2018   Procedure: ESOPHAGOGASTRODUODENOSCOPY (EGD);  Surgeon: Lollie Sails, MD;  Location: [facility] ENDOSCOPY;  Service: Endoscopy;  Laterality: N/A;   ESOPHAGOGASTRODUODENOSCOPY (EGD) WITH PROPOFOL N/A 08/06/2020   Procedure: ESOPHAGOGASTRODUODENOSCOPY (EGD) WITH PROPOFOL;  Surgeon: Virgel Manifold, MD;  Location: [facility] ENDOSCOPY;  Service: Endoscopy;  Laterality: N/A;   LUMBAR LAMINECTOMY/DECOMPRESSION MICRODISCECTOMY N/A 07/01/2021   Procedure: LEFT L3-4 MICRODSICECTOMY, L4-S1 DECOMPRESSION;  Surgeon: Meade Maw, MD;  Location: [facility] ORS;  Service: Neurosurgery;  Laterality: N/A;   SHOULDER ARTHROSCOPY Left 07/25/2018   Procedure: SHOULDER MINI OPEN ROTATOR CUFF REPAIR  BICEPS TENDOSIS ARTHROSCOPIC DISTAL CLAVICLE EXCISION  SUBACROMIAL DECOMP;  Surgeon: Leim Fabry, MD;  Location: [place];  Service: Orthopedics;  Laterality: Left;  [facility] WITH SPYDER SMITH & NEPHEW HEAD COIL ANCHOR FOOTPRINT ANCHOR QFIX ANCHOR   VISCERAL ANGIOGRAPHY N/A 08/14/2020   Procedure: VISCERAL ANGIOGRAPHY;  Surgeon: Algernon Huxley, MD;  Location: [place] [facility];  Service: Cardiovascular;  Laterality: N/A;    Family History  Problem Relation Age of Onset   Breast cancer Neg Hx     Allergies  Allergen Reactions   Bee Venom Swelling   Shellfish Allergy Swelling   Tamoxifen Hives   Penicillins Hives, Rash and Other (See Comments)    Has patient had a PCN reaction causing immediate rash, facial/tongue/throat swelling, SOB or lightheadedness with hypotension: Unknown Has patient had a PCN reaction causing severe rash involving mucus membranes or skin  necrosis: Unknown Has patient had a PCN reaction that required hospitalization: Unknown Has patient had a PCN reaction occurring within the last 10 years: No If all of the above answers are "NO", then may proceed with Cephalosporin use.     CBC Latest Ref Rng & Units 08/28/2021 07/20/2021 07/15/2021  WBC 4.0 - 10.5 K/uL 8.3 8.6 14.0(H)  Hemoglobin 12.0 - 15.0 g/dL 11.4(L) 13.2 11.0(L)  Hematocrit 36.0 - 46.0 % 32.7(L) 37.3 31.7(L)  Platelets 150 - 400 K/uL 330 312 223      CMP     Component Value Date/Time   NA 134 (L) 08/28/2021 1516   K 3.0 (L) 08/28/2021 1516   K 3.0 (L) 02/14/2014 1447   CL 96 (L) 08/28/2021 1516  CO2 27 08/28/2021 1516   GLUCOSE 110 (H) 08/28/2021 1516   BUN 17 08/28/2021 1516   CREATININE 2.01 (H) 08/28/2021 1516   CALCIUM 10.2 08/28/2021 1516   CALCIUM 10.1 08/28/2021 1516   PROT 7.7 08/28/2021 1516   ALBUMIN 4.0 08/28/2021 1516   AST 17 08/28/2021 1516   ALT 13 08/28/2021 1516   ALKPHOS 71 08/28/2021 1516   BILITOT 0.6 08/28/2021 1516   GFRNONAA 25 (L) 08/28/2021 1516   GFRAA 38 (L) 08/22/2020 1210     No results found.     Assessment & Plan:   1. Celiac artery stenosis (HCC) Recommend:  The patient has evidence of severe atherosclerotic changes of the mesenteric arteries associated with weight loss as well as abdominal pain and N/V.  This represents a high risk for bowel infarction and death.  Patient should undergo angiography of the mesenteric arteries with the hope for intervention to eliminate the ischemic symptoms.    The risks and benefits as well as the alternative therapies was discussed in detail with the patient.  All questions were answered.  Patient agrees to proceed with angiography and intervention.  The patient will follow up with me after the angiogram.   2. Benign hypertension with CKD (chronic kidney disease) stage III (HCC) Continue antihypertensive medications as already ordered, these medications have been reviewed  and there are no changes at this time.    Current Outpatient Medications on File Prior to Visit  Medication Sig Dispense Refill   acetaminophen (TYLENOL) 325 MG tablet Take 2 tablets (650 mg total) by mouth every 6 (six) hours as needed for mild pain, moderate pain, fever or headache (or Fever >/= 101).     amLODipine (NORVASC) 5 MG tablet Take 5 mg by mouth daily.     apixaban (ELIQUIS) 5 MG TABS tablet Take 5 mg by mouth 2 (two) times daily.     bisacodyl (DULCOLAX) 5 MG EC tablet Take 1 tablet (5 mg total) by mouth daily as needed for moderate constipation. 30 tablet 0   cetirizine (ZYRTEC) 10 MG tablet Take 10 mg by mouth daily as needed for allergies.     citalopram (CELEXA) 20 MG tablet Take 20 mg by mouth daily.     clopidogrel (PLAVIX) 75 MG tablet Take 75 mg by mouth daily.     HYDROcodone-acetaminophen (NORCO/VICODIN) 5-325 MG tablet Take 1 tablet by mouth every 6 (six) hours as needed for moderate pain or severe pain. (Patient not taking: Reported on 08/28/2021) 30 tablet 0   levothyroxine (SYNTHROID, LEVOTHROID) 75 MCG tablet Take 75 mcg by mouth daily.     losartan (COZAAR) 50 MG tablet Take 50 mg by mouth daily.      omeprazole (PRILOSEC) 20 MG capsule Take 20 mg by mouth 2 (two) times daily.     senna (SENOKOT) 8.6 MG TABS tablet Take 1 tablet (8.6 mg total) by mouth 2 (two) times daily. (Patient not taking: Reported on 08/28/2021) 120 tablet 0   triamterene-hydrochlorothiazide (MAXZIDE-25) 37.5-25 MG tablet Take 1 tablet by mouth daily.     No current facility-administered medications on file prior to visit.    There are no Patient Instructions on file for this visit. No follow-ups on file.   Kris Hartmann, NP

## 2021-09-01 LAB — MULTIPLE MYELOMA PANEL, SERUM
Albumin SerPl Elph-Mcnc: 3.5 g/dL (ref 2.9–4.4)
Albumin/Glob SerPl: 1.1 (ref 0.7–1.7)
Alpha 1: 0.2 g/dL (ref 0.0–0.4)
Alpha2 Glob SerPl Elph-Mcnc: 0.8 g/dL (ref 0.4–1.0)
B-Globulin SerPl Elph-Mcnc: 1.2 g/dL (ref 0.7–1.3)
Gamma Glob SerPl Elph-Mcnc: 1.1 g/dL (ref 0.4–1.8)
Globulin, Total: 3.3 g/dL (ref 2.2–3.9)
IgA: 344 mg/dL (ref 64–422)
IgG (Immunoglobin G), Serum: 1040 mg/dL (ref 586–1602)
IgM (Immunoglobulin M), Srm: 65 mg/dL (ref 26–217)
Total Protein ELP: 6.8 g/dL (ref 6.0–8.5)

## 2021-09-02 ENCOUNTER — Other Ambulatory Visit (INDEPENDENT_AMBULATORY_CARE_PROVIDER_SITE_OTHER): Payer: Self-pay | Admitting: Vascular Surgery

## 2021-09-02 DIAGNOSIS — M5136 Other intervertebral disc degeneration, lumbar region: Secondary | ICD-10-CM | POA: Diagnosis not present

## 2021-09-02 DIAGNOSIS — Z4789 Encounter for other orthopedic aftercare: Secondary | ICD-10-CM | POA: Diagnosis not present

## 2021-09-02 DIAGNOSIS — M5137 Other intervertebral disc degeneration, lumbosacral region: Secondary | ICD-10-CM | POA: Diagnosis not present

## 2021-09-02 DIAGNOSIS — M4807 Spinal stenosis, lumbosacral region: Secondary | ICD-10-CM | POA: Diagnosis not present

## 2021-09-02 DIAGNOSIS — M4316 Spondylolisthesis, lumbar region: Secondary | ICD-10-CM | POA: Diagnosis not present

## 2021-09-02 DIAGNOSIS — M48061 Spinal stenosis, lumbar region without neurogenic claudication: Secondary | ICD-10-CM | POA: Diagnosis not present

## 2021-09-02 DIAGNOSIS — M47816 Spondylosis without myelopathy or radiculopathy, lumbar region: Secondary | ICD-10-CM | POA: Diagnosis not present

## 2021-09-02 DIAGNOSIS — M4317 Spondylolisthesis, lumbosacral region: Secondary | ICD-10-CM | POA: Diagnosis not present

## 2021-09-02 DIAGNOSIS — M47817 Spondylosis without myelopathy or radiculopathy, lumbosacral region: Secondary | ICD-10-CM | POA: Diagnosis not present

## 2021-09-03 ENCOUNTER — Other Ambulatory Visit: Payer: Self-pay

## 2021-09-03 ENCOUNTER — Encounter: Payer: Self-pay | Admitting: Vascular Surgery

## 2021-09-03 ENCOUNTER — Encounter
Admission: RE | Disposition: A | Discharge status: Home / Self Care | Payer: Self-pay | Source: Home / Self Care | Attending: Vascular Surgery

## 2021-09-03 ENCOUNTER — Ambulatory Visit
Admission: RE | Admit: 2021-09-03 | Discharge: 2021-09-03 | Disposition: A | Discharge status: Home / Self Care | Payer: Medicare HMO | Attending: Vascular Surgery | Admitting: Vascular Surgery

## 2021-09-03 DIAGNOSIS — K551 Chronic vascular disorders of intestine: Secondary | ICD-10-CM | POA: Insufficient documentation

## 2021-09-03 DIAGNOSIS — Z91013 Allergy to seafood: Secondary | ICD-10-CM | POA: Diagnosis not present

## 2021-09-03 DIAGNOSIS — Z888 Allergy status to other drugs, medicaments and biological substances status: Secondary | ICD-10-CM | POA: Insufficient documentation

## 2021-09-03 DIAGNOSIS — Z79890 Hormone replacement therapy (postmenopausal): Secondary | ICD-10-CM | POA: Insufficient documentation

## 2021-09-03 DIAGNOSIS — Z79899 Other long term (current) drug therapy: Secondary | ICD-10-CM | POA: Insufficient documentation

## 2021-09-03 DIAGNOSIS — I129 Hypertensive chronic kidney disease with stage 1 through stage 4 chronic kidney disease, or unspecified chronic kidney disease: Secondary | ICD-10-CM | POA: Diagnosis not present

## 2021-09-03 DIAGNOSIS — N183 Chronic kidney disease, stage 3 unspecified: Secondary | ICD-10-CM | POA: Insufficient documentation

## 2021-09-03 DIAGNOSIS — I708 Atherosclerosis of other arteries: Secondary | ICD-10-CM | POA: Insufficient documentation

## 2021-09-03 DIAGNOSIS — Z7902 Long term (current) use of antithrombotics/antiplatelets: Secondary | ICD-10-CM | POA: Insufficient documentation

## 2021-09-03 DIAGNOSIS — Z880 Allergy status to penicillin: Secondary | ICD-10-CM | POA: Insufficient documentation

## 2021-09-03 DIAGNOSIS — Z7901 Long term (current) use of anticoagulants: Secondary | ICD-10-CM | POA: Diagnosis not present

## 2021-09-03 DIAGNOSIS — I771 Stricture of artery: Secondary | ICD-10-CM | POA: Insufficient documentation

## 2021-09-03 DIAGNOSIS — Z87891 Personal history of nicotine dependence: Secondary | ICD-10-CM | POA: Insufficient documentation

## 2021-09-03 HISTORY — PX: VISCERAL ANGIOGRAPHY: CATH118276

## 2021-09-03 SURGERY — VISCERAL ANGIOGRAPHY
Anesthesia: Moderate Sedation

## 2021-09-03 MED ORDER — FENTANYL CITRATE PF 50 MCG/ML IJ SOSY
PREFILLED_SYRINGE | INTRAMUSCULAR | Status: AC
  Filled 2021-09-03: qty 2

## 2021-09-03 MED ORDER — FAMOTIDINE 20 MG PO TABS
ORAL_TABLET | ORAL | Status: AC
  Administered 2021-09-03: 40 mg via ORAL
  Filled 2021-09-03: qty 2

## 2021-09-03 MED ORDER — HYDROMORPHONE HCL 1 MG/ML IJ SOLN: 1.0000 mg | Freq: Once | INTRAMUSCULAR | Status: DC | PRN

## 2021-09-03 MED ORDER — MIDAZOLAM HCL 2 MG/ML PO SYRP: 8.0000 mg | ORAL_SOLUTION | Freq: Once | ORAL | Status: DC | PRN

## 2021-09-03 MED ORDER — FAMOTIDINE 20 MG PO TABS: 40.0000 mg | ORAL_TABLET | Freq: Once | ORAL | Status: AC | PRN

## 2021-09-03 MED ORDER — DIPHENHYDRAMINE HCL 50 MG/ML IJ SOLN: 50.0000 mg | Freq: Once | INTRAMUSCULAR | Status: AC | PRN

## 2021-09-03 MED ORDER — METHYLPREDNISOLONE SODIUM SUCC 125 MG IJ SOLR: 125.0000 mg | Freq: Once | INTRAMUSCULAR | Status: AC | PRN

## 2021-09-03 MED ORDER — ONDANSETRON HCL 4 MG/2ML IJ SOLN: 4.0000 mg | Freq: Four times a day (QID) | INTRAMUSCULAR | Status: DC | PRN

## 2021-09-03 MED ORDER — MIDAZOLAM HCL 2 MG/2ML IJ SOLN
INTRAMUSCULAR | Status: DC | PRN
  Administered 2021-09-03: 2 mg via INTRAVENOUS

## 2021-09-03 MED ORDER — SODIUM CHLORIDE 0.9 % IV SOLN: INTRAVENOUS | Status: DC

## 2021-09-03 MED ORDER — METHYLPREDNISOLONE SODIUM SUCC 125 MG IJ SOLR
INTRAMUSCULAR | Status: AC
  Administered 2021-09-03: 125 mg via INTRAVENOUS
  Filled 2021-09-03: qty 2

## 2021-09-03 MED ORDER — FENTANYL CITRATE (PF) 100 MCG/2ML IJ SOLN
INTRAMUSCULAR | Status: DC | PRN
  Administered 2021-09-03: 50 ug via INTRAVENOUS

## 2021-09-03 MED ORDER — DIPHENHYDRAMINE HCL 50 MG/ML IJ SOLN
INTRAMUSCULAR | Status: AC
  Administered 2021-09-03: 50 mg via INTRAVENOUS
  Filled 2021-09-03: qty 1

## 2021-09-03 MED ORDER — CLINDAMYCIN PHOSPHATE 300 MG/50ML IV SOLN: 300.0000 mg | Freq: Once | INTRAVENOUS | Status: AC

## 2021-09-03 MED ORDER — MIDAZOLAM HCL 5 MG/5ML IJ SOLN
INTRAMUSCULAR | Status: AC
  Filled 2021-09-03: qty 5

## 2021-09-03 MED ORDER — HEPARIN SODIUM (PORCINE) 1000 UNIT/ML IJ SOLN
INTRAMUSCULAR | Status: AC
  Filled 2021-09-03: qty 1

## 2021-09-03 MED ORDER — CLINDAMYCIN PHOSPHATE 300 MG/50ML IV SOLN
INTRAVENOUS | Status: AC
  Administered 2021-09-03: 300 mg via INTRAVENOUS
  Filled 2021-09-03: qty 50

## 2021-09-03 SURGICAL SUPPLY — 11 items
CATH ANGIO 5F PIGTAIL 65CM (CATHETERS) ×2 IMPLANT
CATH VS15FR (CATHETERS) ×2 IMPLANT
COVER PROBE U/S 5X48 (MISCELLANEOUS) ×2 IMPLANT
DEVICE STARCLOSE SE CLOSURE (Vascular Products) ×2 IMPLANT
DEVICE TORQUE .025-.038 (MISCELLANEOUS) ×2 IMPLANT
GLIDEWIRE STIFF .35X180X3 HYDR (WIRE) ×2 IMPLANT
PACK ANGIOGRAPHY (CUSTOM PROCEDURE TRAY) ×2 IMPLANT
SHEATH BRITE TIP 5FRX11 (SHEATH) ×2 IMPLANT
SYR MEDRAD MARK 7 150ML (SYRINGE) ×2 IMPLANT
TUBING CONTRAST HIGH PRESS 72 (TUBING) ×2 IMPLANT
WIRE GUIDERIGHT .035X150 (WIRE) ×2 IMPLANT

## 2021-09-03 NOTE — Op Note (Signed)
[facility]  Percutaneous Study/Intervention Procedural Note   Date: 09/03/2021  Surgeon(s): [name], MD  Assistants: none  Pre-operative Diagnosis: 1.  Chronic mesenteric ischemia 2.  Previous celiac artery stent placement with duplex suggesting recurrent stenosis   Post-operative diagnosis:  Same  Procedure(s) Performed:             1.  Ultrasound guidance for vascular access right femoral artery             2.  Catheter placement into celiac artery from right femoral approach             3.  Aortogram and selective angiogram of the celiac artery             4.  StarClose closure device right femoral artery  Contrast: 35  Fluoro time: 2.4 minutes  EBL: 5 cc  Anesthesia: Approximately 23 minutes of Moderate conscious sedation using 2 mg of Versed and 50 mcg of Fentanyl              Indications:  Patient is a 80 y.o. female who has symptoms consistent with mesenteric ischemia.  She has previously had medium arcuate ligament release as well as celiac stent placement.  The patient has a duplex showing velocities concerning for greater than 60% stenosis of the celiac artery. The patient is brought in for angiography for further evaluation and potential treatment. Risks and benefits are discussed and informed consent is obtained  Procedure:  The patient was identified and appropriate procedural time out was performed.  The patient was then placed supine on the table and prepped and draped in the usual sterile fashion. Moderate conscious sedation was administered during a face to face encounter with the patient throughout the procedure with my supervision of the RN administering medicines and monitoring the patient's vital signs, pulse oximetry, telemetry and mental status throughout from the start of the procedure until the patient was taken to the recovery room.  Ultrasound was used to evaluate the right common femoral artery.  It was patent .  A digital  ultrasound image was acquired.  A Seldinger needle was used to access the right common femoral artery under direct ultrasound guidance and a permanent image was performed.  A 0.035 J wire was advanced without resistance and a 5Fr sheath was placed.  Pigtail catheter was placed into the aorta and an AP aortogram was performed. Both renal arteries appeared widely patent as did the aorta and iliac arteries.  In the lateral projection, the SMA was widely patent without significant stenosis.  The celiac had a stent that had a significant curvature and could not be well seen on the original aortogram in the lateral projection due to the multiple surgical clips in the area.  I selectively cannulated the celiac artery with a V S1 catheter and perform selective imaging in multiple views which demonstrated that the stent was widely patent and there is no significant stenosis within the previously placed stent or proximal or distal to the previously placed stent.  This would suggest the elevated velocities on duplex were from the marked curvature in the vessel.  At this point, I elected to terminate the procedure.  No intervention was required.  The diagnostic catheter was removed. StarClose closure device was deployed in usual fashion with excellent hemostatic result. The patient was taken to the recovery room in stable condition having tolerated the procedure well.     Findings: Both renal arteries appeared widely patent as  did the aorta and iliac arteries.  In the lateral projection, the SMA was widely patent without significant stenosis.  The celiac had a stent that had a significant curvature and could not be well seen on the original aortogram in the lateral projection due to the multiple surgical clips in the area.  I selectively cannulated the celiac artery with a V S1 catheter and perform selective imaging in multiple views which demonstrated that the stent was widely patent and there is no significant stenosis  within the previously placed stent or proximal or distal to the previously placed stent.  This would suggest the elevated velocities on duplex were from the marked curvature in the vessel  Disposition: Patient was taken to the recovery room in stable condition having tolerated the procedure well.  Complications:  None  [name] 09/03/2021 10:54 AM   This note was created with Dragon Medical transcription system. Any errors in dictation are purely unintentional.

## 2021-09-03 NOTE — Interval H&P Note (Signed)
History and Physical Interval Note:  09/03/2021 8:45 AM  Latoya Freeman  has presented today for surgery, with the diagnosis of Mesenteric Angiography   Iliac artery stenosis   SHELLFISH ALLERGY.  The various methods of treatment have been discussed with the patient and family. After consideration of risks, benefits and other options for treatment, the patient has consented to  Procedure(s): VISCERAL ANGIOGRAPHY (N/A) as a surgical intervention.  The patient's history has been reviewed, patient examined, no change in status, stable for surgery.  I have reviewed the patient's chart and labs.  Questions were answered to the patient's satisfaction.     [name]

## 2021-09-06 LAB — PTH-RELATED PEPTIDE: PTH-related peptide: <2 pmol/L

## 2021-09-10 DIAGNOSIS — M4807 Spinal stenosis, lumbosacral region: Secondary | ICD-10-CM | POA: Diagnosis not present

## 2021-09-10 DIAGNOSIS — M5137 Other intervertebral disc degeneration, lumbosacral region: Secondary | ICD-10-CM | POA: Diagnosis not present

## 2021-09-10 DIAGNOSIS — M5136 Other intervertebral disc degeneration, lumbar region: Secondary | ICD-10-CM | POA: Diagnosis not present

## 2021-09-10 DIAGNOSIS — M4317 Spondylolisthesis, lumbosacral region: Secondary | ICD-10-CM | POA: Diagnosis not present

## 2021-09-10 DIAGNOSIS — Z4789 Encounter for other orthopedic aftercare: Secondary | ICD-10-CM | POA: Diagnosis not present

## 2021-09-10 DIAGNOSIS — M47817 Spondylosis without myelopathy or radiculopathy, lumbosacral region: Secondary | ICD-10-CM | POA: Diagnosis not present

## 2021-09-10 DIAGNOSIS — M48061 Spinal stenosis, lumbar region without neurogenic claudication: Secondary | ICD-10-CM | POA: Diagnosis not present

## 2021-09-10 DIAGNOSIS — M4316 Spondylolisthesis, lumbar region: Secondary | ICD-10-CM | POA: Diagnosis not present

## 2021-09-10 DIAGNOSIS — M47816 Spondylosis without myelopathy or radiculopathy, lumbar region: Secondary | ICD-10-CM | POA: Diagnosis not present

## 2021-09-15 ENCOUNTER — Ambulatory Visit (INDEPENDENT_AMBULATORY_CARE_PROVIDER_SITE_OTHER): Payer: Medicare HMO | Admitting: Gastroenterology

## 2021-09-15 ENCOUNTER — Other Ambulatory Visit: Payer: Self-pay

## 2021-09-15 DIAGNOSIS — K30 Functional dyspepsia: Secondary | ICD-10-CM

## 2021-09-15 DIAGNOSIS — D509 Iron deficiency anemia, unspecified: Secondary | ICD-10-CM

## 2021-09-15 DIAGNOSIS — R197 Diarrhea, unspecified: Secondary | ICD-10-CM

## 2021-09-15 MED ORDER — PANTOPRAZOLE SODIUM 40 MG PO TBEC: 40.0000 mg | DELAYED_RELEASE_TABLET | Freq: Two times a day (BID) | ORAL | 0 refills | Status: DC

## 2021-09-16 NOTE — Progress Notes (Signed)
Vonda Antigua, MD [address]  [place]  [place], [place] [address]  Main: [phone]  Fax: [phone]   Primary Care Physician: [name], MD   Chief complaint: Indigestion, loose stools  HPI: Latoya Freeman is a 80 y.o. female here for follow-up with her daughter.  Reports frequent indigestion despite taking Prilosec twice a day.  No dysphagia.  No nausea or vomiting.  Is also reporting loose stools as of the last few weeks to a month, 2-3 loose stools a day.  No blood in stool.    Vascular surgery clinic note reports severe atherosclerotic changes of the mesenteric arteries, representing high risk for bowel infarction.  Patient was scheduled for angiography.  Recently underwent the procedure with Dr. Lucky Cowboy of vascular surgery.  Operative note reviewed from 09/03/2021 and noted " I selectively cannulated the celiac artery with a V S1 catheter and perform selective imaging in multiple views which demonstrated that the stent was widely patent and there is no significant stenosis within the previously placed stent or proximal or distal to the previously placed stent.  This would suggest the elevated velocities on duplex were from the marked curvature in the vessel"  Patient seen by hematology and receiving IV iron replacement. Oct 2022 note reviewed.  Was also recently admitted for unprovoked PE and is on Eliquis.  Was also recently admitted for bacterial meningitis in August 2022.  EGD and colonoscopy for iron deficiency anemia in September 2021.  EGD showed salmon-colored mucosa suggestive of short segment Barrett's.  Colonoscopy with 2 subcentimeter polyps removed  Previous History: patient went to the ER for abdominal pain and was admitted.  CTA showed greater than 50% celiac stenosis, patient was evaluated by vascular surgery who did not think this attributed to her abdominal pain did not recommend further intervention.  Patient continues to report bilateral lower  quadrant and epigastric abdominal pain despite treatment with Linzess for constipation and having regular bowel movements.  Also on PPI and H. pylori stool antigen negative.   Previous history from previous visit: When inquired about her symptoms, she reports bilateral lower quadrant abdominal pain since November 2020.  States it worsens with meals, improves with certain stretches.  No weight loss.  States she thinks this "ulcer" pain.  However, when I discussed the stomach ulcers usually cause pain in the upper abdomen region, then she stated, that sometimes she does have pain in the bilateral upper quadrant region, which is a different location than she gave and her initial history above.   Review of her previous record, and Dr. Ricky Stabs note from May 2021 states that patient has history of Barrett's esophagus with surveillance due in March 2022.  They do report history of right lower quadrant abdominal pain chronic constipation.   "Summary of evaluation: - CSY: 01/2018 - pandiverticulosis, one 3 mm polyp at hepatic flexure with absent tissue on biopsy, and one 4 mm polyp at splenic flexure with path showing tubular adenoma - EGD: 01/2018 - short-segment Barrett's esophagus, erosive gastritis with minimal chronic gastritis, single gastric polyp with minimal chronic inflammation, normal duodenum "   Please see their detailed notes.  They recommended peppermint oil after last visit.   Patient reports small stool as well.  Was taking Colace but continued to have constipation has been using Dulcolax suppositories as needed.  Has tried MiraLAX in the past with inconsistent results.  No blood in stool.   See a recent CT scan from April 2021 which did  not show any acute abnormalities  ROS: All ROS reviewed and negative except as per HPI   Past Medical History:  Diagnosis Date   Aneurysm ([place]) 1980   Arthritis    Cancer ([place])    GERD (gastroesophageal reflux disease)    Hypertension 1980    Hypothyroidism 1999   Pneumonia    Pulmonary embolism ([place])    Shingles    Wears dentures    full upper and lower    Past Surgical History:  Procedure Laterality Date   BRAIN SURGERY     aneurysm   BREAST CYST ASPIRATION Left 2005   BREAST EXCISIONAL BIOPSY Left 2013   atypical ductal hyperplasia   BREAST SURGERY  2013   LF Breast Wide EXC    CHOLECYSTECTOMY  2004   COLONOSCOPY     COLONOSCOPY WITH PROPOFOL N/A 01/31/2018   Procedure: COLONOSCOPY WITH PROPOFOL;  Surgeon: Lollie Sails, MD;  Location: [facility] ENDOSCOPY;  Service: Endoscopy;  Laterality: N/A;   COLONOSCOPY WITH PROPOFOL N/A 08/06/2020   Procedure: COLONOSCOPY WITH PROPOFOL;  Surgeon: Virgel Manifold, MD;  Location: [facility] ENDOSCOPY;  Service: Endoscopy;  Laterality: N/A;   DILATION AND CURETTAGE OF UTERUS N/A 05/03/2019   Procedure: DILATATION AND CURETTAGE;  Surgeon: Gae Dry, MD;  Location: [facility] ORS;  Service: Gynecology;  Laterality: N/A;   ESOPHAGOGASTRODUODENOSCOPY N/A 01/31/2018   Procedure: ESOPHAGOGASTRODUODENOSCOPY (EGD);  Surgeon: Lollie Sails, MD;  Location: [facility] ENDOSCOPY;  Service: Endoscopy;  Laterality: N/A;   ESOPHAGOGASTRODUODENOSCOPY (EGD) WITH PROPOFOL N/A 08/06/2020   Procedure: ESOPHAGOGASTRODUODENOSCOPY (EGD) WITH PROPOFOL;  Surgeon: Virgel Manifold, MD;  Location: [facility] ENDOSCOPY;  Service: Endoscopy;  Laterality: N/A;   LUMBAR LAMINECTOMY/DECOMPRESSION MICRODISCECTOMY N/A 07/01/2021   Procedure: LEFT L3-4 MICRODSICECTOMY, L4-S1 DECOMPRESSION;  Surgeon: Meade Maw, MD;  Location: [facility] ORS;  Service: Neurosurgery;  Laterality: N/A;   LUMBAR PUNCTURE     SHOULDER ARTHROSCOPY Left 07/25/2018   Procedure: SHOULDER MINI OPEN ROTATOR CUFF REPAIR  BICEPS TENDOSIS ARTHROSCOPIC DISTAL CLAVICLE EXCISION  SUBACROMIAL DECOMP;  Surgeon: Leim Fabry, MD;  Location: [facility];  Service: Orthopedics;  Laterality: Left;  [facility] WITH SPYDER SMITH & NEPHEW HEAD COIL  ANCHOR FOOTPRINT ANCHOR QFIX ANCHOR   VISCERAL ANGIOGRAPHY N/A 08/14/2020   Procedure: VISCERAL ANGIOGRAPHY;  Surgeon: Algernon Huxley, MD;  Location: [facility] CV LAB;  Service: Cardiovascular;  Laterality: N/A;   VISCERAL ANGIOGRAPHY N/A 09/03/2021   Procedure: VISCERAL ANGIOGRAPHY;  Surgeon: Algernon Huxley, MD;  Location: [facility];  Service: Cardiovascular;  Laterality: N/A;    Prior to Admission medications   Medication Sig Start Date End Date Taking? Authorizing Provider  acetaminophen (TYLENOL) 325 MG tablet Take 2 tablets (650 mg total) by mouth every 6 (six) hours as needed for mild pain, moderate pain, fever or headache (or Fever >/= 101). 07/21/21  Yes Lorella Nimrod, MD  amLODipine (NORVASC) 5 MG tablet Take 5 mg by mouth daily. 02/07/13  Yes [provider]  apixaban (ELIQUIS) 5 MG TABS tablet Take 5 mg by mouth 2 (two) times daily.   Yes [provider]  cetirizine (ZYRTEC) 10 MG tablet Take 10 mg by mouth daily as needed for allergies.   Yes [provider]  citalopram (CELEXA) 20 MG tablet Take 20 mg by mouth daily. 04/23/19  Yes [provider]  levothyroxine (SYNTHROID, LEVOTHROID) 75 MCG tablet Take 75 mcg by mouth daily.   Yes [provider]  losartan (COZAAR) 50 MG tablet Take 50  mg by mouth daily.    Yes [provider]  pantoprazole (PROTONIX) 40 MG tablet Take 1 tablet (40 mg total) by mouth 2 (two) times daily. 09/15/21 10/15/21 Yes Virgel Manifold, MD  triamterene-hydrochlorothiazide (MAXZIDE-25) 37.5-25 MG tablet Take 1 tablet by mouth daily. 07/10/20  Yes [provider]  bisacodyl (DULCOLAX) 5 MG EC tablet Take 1 tablet (5 mg total) by mouth daily as needed for moderate constipation. Patient not taking: No sig reported 07/02/21   Loleta Dicker, PA  clopidogrel (PLAVIX) 75 MG tablet Take 75 mg by mouth daily. Patient not taking: Reported on 09/15/2021    [provider]   HYDROcodone-acetaminophen (NORCO/VICODIN) 5-325 MG tablet Take 1 tablet by mouth every 6 (six) hours as needed for moderate pain or severe pain. Patient not taking: No sig reported 07/21/21   Lorella Nimrod, MD  potassium chloride SA (KLOR-CON) 20 MEQ tablet Take 1 tablet (20 mEq total) by mouth 2 (two) times daily. Patient not taking: No sig reported 08/31/21   Jacquelin Hawking, NP  senna (SENOKOT) 8.6 MG TABS tablet Take 1 tablet (8.6 mg total) by mouth 2 (two) times daily. Patient not taking: Reported on 09/15/2021 07/02/21   Loleta Dicker, PA  solifenacin (VESICARE) 5 MG tablet Take 5 mg by mouth daily. Patient not taking: No sig reported    [provider]    Family History  Problem Relation Age of Onset   Breast cancer Neg Hx      Social History   Tobacco Use   Smoking status: Former    Packs/day: 1.50    Years: 20.00    Pack years: 30.00    Types: Cigarettes    Quit date: 1990    Years since quitting: 32.8   Smokeless tobacco: Never  Vaping Use   Vaping Use: Never used  Substance Use Topics   Alcohol use: No   Drug use: No    Allergies as of 09/15/2021 - Review Complete 09/03/2021  Allergen Reaction Noted   Bee venom Swelling 01/31/2018   Shellfish allergy Swelling 01/31/2018   Tamoxifen Hives 07/20/2018   Penicillins Hives, Rash, and Other (See Comments) 12/21/2012    Physical Examination:  Constitutional: General:   Alert,  Well-developed, well-nourished, pleasant and cooperative in NAD There were no vitals taken for this visit.  Respiratory: Normal respiratory effort  Gastrointestinal:  Soft, non-tender and non-distended without masses, hepatosplenomegaly or hernias noted.  No guarding or rebound tenderness.     Cardiac: No clubbing or edema.  No cyanosis. Normal posterior tibial pedal pulses noted.  Psych:  Alert and cooperative. Normal mood and affect.  Musculoskeletal:  Normal gait. Head normocephalic, atraumatic. Symmetrical without  gross deformities. 5/5 Lower extremity strength bilaterally.  Skin: Warm. Intact without significant lesions or rashes. No jaundice.  Neck: Supple, trachea midline  Lymph: No cervical lymphadenopathy  Psych:  Alert and oriented x3, Alert and cooperative. Normal mood and affect.  Labs: CMP     Component Value Date/Time   NA 134 (L) 08/28/2021 1516   K 3.0 (L) 08/28/2021 1516   K 3.0 (L) 02/14/2014 1447   CL 96 (L) 08/28/2021 1516   CO2 27 08/28/2021 1516   GLUCOSE 110 (H) 08/28/2021 1516   BUN 17 08/28/2021 1516   CREATININE 2.01 (H) 08/28/2021 1516   CALCIUM 10.2 08/28/2021 1516   CALCIUM 10.1 08/28/2021 1516   PROT 7.7 08/28/2021 1516   ALBUMIN 4.0 08/28/2021 1516   AST 17 08/28/2021 1516  ALT 13 08/28/2021 1516   ALKPHOS 71 08/28/2021 1516   BILITOT 0.6 08/28/2021 1516   GFRNONAA 25 (L) 08/28/2021 1516   GFRAA 38 (L) 08/22/2020 1210   Lab Results  Component Value Date   WBC 8.3 08/28/2021   HGB 11.4 (L) 08/28/2021   HCT 32.7 (L) 08/28/2021   MCV 88.1 08/28/2021   PLT 330 08/28/2021   Ferritin 75 Iron 58  Imaging Studies:   Assessment and Plan:   JLA REYNOLDS is a 80 y.o. y/o female here for follow-up and is reporting loose stools and indigestion despite twice daily PPI therapy   Patient has had recent medical care and hospitalizations, therefore at risk for developing C. difficile.  Obtain GI profile at this time.  Also check fecal elastase  Change PPI from Prilosec to Protonix given ongoing daily symptoms despite twice daily dosing.  Proper use 30 to 45 minutes before breakfast and dinner discussed  (Risks of PPI use were discussed with patient including bone loss, C. Diff diarrhea, pneumonia, infections, CKD, electrolyte abnormalities.  Pt. Verbalizes understanding and chooses to continue the medication.)  Patient educated extensively on acid reflux lifestyle modification, including using a bed wedge, not eating 3 hrs before bedtime, diet  modifications, and handout given for the same.   Iron labs/ferritin have improved.  Given improving iron labs, and extensive recent medical issues including PE, bacterial meningitis, ongoing vascular work-up, would not recommend capsule study at this time in the absence of any active bleeding and improving labs.  However, this can be considered in the future depending on labs and once she is further out from her recent hospitalizations and acute medical issues/vascular work-up.  As per patient, hematology told her she may have a component of anemia from other causes such as kidney disease and not just iron deficiency  Dr Vonda Antigua

## 2021-09-20 DIAGNOSIS — R197 Diarrhea, unspecified: Secondary | ICD-10-CM | POA: Diagnosis not present

## 2021-09-24 LAB — GI PROFILE, STOOL, PCR

## 2021-09-24 LAB — PANCREATIC ELASTASE, FECAL: Pancreatic Elastase, Fecal: 240 ug Elast./g (ref >200)

## 2021-10-07 ENCOUNTER — Other Ambulatory Visit: Payer: Self-pay | Admitting: Oncology

## 2021-10-08 ENCOUNTER — Telehealth: Payer: Self-pay | Admitting: Gastroenterology

## 2021-10-08 NOTE — Telephone Encounter (Signed)
Inbound call from pt's daughter requesting a call back stating her mother is in pain with poor appetite

## 2021-10-09 NOTE — Telephone Encounter (Signed)
Per Dr Bonna Gains, pt can come in Monday. I spoke to daughter and she stated she will bring pt to [facility] office for 10:15 appt 11/21

## 2021-10-12 ENCOUNTER — Ambulatory Visit (INDEPENDENT_AMBULATORY_CARE_PROVIDER_SITE_OTHER): Payer: Medicare HMO | Admitting: Gastroenterology

## 2021-10-12 ENCOUNTER — Other Ambulatory Visit: Payer: Self-pay

## 2021-10-12 ENCOUNTER — Encounter: Payer: Self-pay | Admitting: Gastroenterology

## 2021-10-12 VITALS — BP 119/71 | HR 82 | Temp 97.4°F | Wt 183.0 lb

## 2021-10-12 DIAGNOSIS — K219 Gastro-esophageal reflux disease without esophagitis: Secondary | ICD-10-CM | POA: Diagnosis not present

## 2021-10-12 MED ORDER — SUCRALFATE 1 G PO TABS: 1.0000 g | ORAL_TABLET | Freq: Four times a day (QID) | ORAL | 0 refills | Status: DC

## 2021-10-12 MED ORDER — PANTOPRAZOLE SODIUM 40 MG PO TBEC
40.0000 mg | DELAYED_RELEASE_TABLET | Freq: Two times a day (BID) | ORAL | 0 refills | Status: DC
Start: 2021-10-12 — End: 2021-10-29

## 2021-10-12 MED ORDER — FAMOTIDINE 20 MG PO TABS: 20.0000 mg | ORAL_TABLET | Freq: Every day | ORAL | 0 refills | Status: DC

## 2021-10-12 NOTE — Addendum Note (Signed)
Addended by: Lurlean Nanny on: 10/12/2021 04:57 PM   Modules accepted: Orders

## 2021-10-12 NOTE — Progress Notes (Signed)
Latoya Antigua, MD [address]  [place]  [place], [place] [address]  Main: [phone]  Fax: [phone]   Primary Care Physician: [name], MD   Chief complaint: Heartburn  HPI: Latoya Freeman is a 80 y.o. female here for follow-up and her main complaint is heartburn.  States she feels a burning sensation in her throat and her chest and will use baking soda and potato chips and this will resolve the sensation.  This usually occurs in the mornings.  After she uses the baking soda and potato chips, she does not have any further symptoms.  However, sometimes she will drink a Pepsi after the potato chips which makes her vomit and after that she does not have any further heartburn.  No hematemesis.  Is denying any abdominal pain, nausea or vomiting.  No altered bowel habits.  No diarrhea.  No blood in stool.  Was given PPI on last visit and states that despite that she is having to use baking soda.  No dysphagia.  I did discuss the patient again with Dr. Lucky Cowboy after her last visit and he does not think that patient's previous abdominal symptoms are from her vascular findings.  GI profile and fecal elastase negative Ferritin and Iron panel Normal Oct 2022  Previous history: Vascular surgery clinic note reports severe atherosclerotic changes of the mesenteric arteries, representing high risk for bowel infarction.  Patient was scheduled for angiography.  Recently underwent the procedure with Dr. Lucky Cowboy of vascular surgery.  Operative note reviewed from 09/03/2021 and noted " I selectively cannulated the celiac artery with a V S1 catheter and perform selective imaging in multiple views which demonstrated that the stent was widely patent and there is no significant stenosis within the previously placed stent or proximal or distal to the previously placed stent.  This would suggest the elevated velocities on duplex were from the marked curvature in the vessel"  Patient seen by  hematology and receiving IV iron replacement. Oct 2022 note reviewed.  Was also recently admitted for unprovoked PE and is on Eliquis.  Was also recently admitted for bacterial meningitis in August 2022.  EGD and colonoscopy for iron deficiency anemia in September 2021.  EGD showed salmon-colored mucosa suggestive of short segment Barrett's.  Colonoscopy with 2 subcentimeter polyps removed  Previous History: patient went to the ER for abdominal pain and was admitted.  CTA showed greater than 50% celiac stenosis, patient was evaluated by vascular surgery who did not think this attributed to her abdominal pain did not recommend further intervention.  Patient continues to report bilateral lower quadrant and epigastric abdominal pain despite treatment with Linzess for constipation and having regular bowel movements.  Also on PPI and H. pylori stool antigen negative.   Previous history from previous visit: When inquired about her symptoms, she reports bilateral lower quadrant abdominal pain since November 2020.  States it worsens with meals, improves with certain stretches.  No weight loss.  States she thinks this "ulcer" pain.  However, when I discussed the stomach ulcers usually cause pain in the upper abdomen region, then she stated, that sometimes she does have pain in the bilateral upper quadrant region, which is a different location than she gave and her initial history above.   Review of her previous record, and Dr. Ricky Stabs note from May 2021 states that patient has history of Barrett's esophagus with surveillance due in March 2022.  They do report history of right lower quadrant abdominal pain chronic  constipation.   "Summary of evaluation: - CSY: 01/2018 - pandiverticulosis, one 3 mm polyp at hepatic flexure with absent tissue on biopsy, and one 4 mm polyp at splenic flexure with path showing tubular adenoma - EGD: 01/2018 - short-segment Barrett's esophagus, erosive gastritis with minimal chronic  gastritis, single gastric polyp with minimal chronic inflammation, normal duodenum "   Please see their detailed notes.  They recommended peppermint oil after last visit.   Patient reports small stool as well.  Was taking Colace but continued to have constipation has been using Dulcolax suppositories as needed.  Has tried MiraLAX in the past with inconsistent results.  No blood in stool.   See a recent CT scan from April 2021 which did not show any acute abnormalities   ROS: All ROS reviewed and negative except as per HPI   Past Medical History:  Diagnosis Date   Aneurysm ([place]) 1980   Arthritis    Cancer ([place])    GERD (gastroesophageal reflux disease)    Hypertension 1980   Hypothyroidism 1999   Pneumonia    Pulmonary embolism ([place])    Shingles    Wears dentures    full upper and lower    Past Surgical History:  Procedure Laterality Date   BRAIN SURGERY     aneurysm   BREAST CYST ASPIRATION Left 2005   BREAST EXCISIONAL BIOPSY Left 2013   atypical ductal hyperplasia   BREAST SURGERY  2013   LF Breast Wide EXC    CHOLECYSTECTOMY  2004   COLONOSCOPY     COLONOSCOPY WITH PROPOFOL N/A 01/31/2018   Procedure: COLONOSCOPY WITH PROPOFOL;  Surgeon: Lollie Sails, MD;  Location: [facility] ENDOSCOPY;  Service: Endoscopy;  Laterality: N/A;   COLONOSCOPY WITH PROPOFOL N/A 08/06/2020   Procedure: COLONOSCOPY WITH PROPOFOL;  Surgeon: Virgel Manifold, MD;  Location: [facility] ENDOSCOPY;  Service: Endoscopy;  Laterality: N/A;   DILATION AND CURETTAGE OF UTERUS N/A 05/03/2019   Procedure: DILATATION AND CURETTAGE;  Surgeon: Gae Dry, MD;  Location: [facility] ORS;  Service: Gynecology;  Laterality: N/A;   ESOPHAGOGASTRODUODENOSCOPY N/A 01/31/2018   Procedure: ESOPHAGOGASTRODUODENOSCOPY (EGD);  Surgeon: Lollie Sails, MD;  Location: [facility] ENDOSCOPY;  Service: Endoscopy;  Laterality: N/A;   ESOPHAGOGASTRODUODENOSCOPY (EGD) WITH PROPOFOL N/A 08/06/2020   Procedure:  ESOPHAGOGASTRODUODENOSCOPY (EGD) WITH PROPOFOL;  Surgeon: Virgel Manifold, MD;  Location: [facility] ENDOSCOPY;  Service: Endoscopy;  Laterality: N/A;   LUMBAR LAMINECTOMY/DECOMPRESSION MICRODISCECTOMY N/A 07/01/2021   Procedure: LEFT L3-4 MICRODSICECTOMY, L4-S1 DECOMPRESSION;  Surgeon: Meade Maw, MD;  Location: [facility] ORS;  Service: Neurosurgery;  Laterality: N/A;   LUMBAR PUNCTURE     SHOULDER ARTHROSCOPY Left 07/25/2018   Procedure: SHOULDER MINI OPEN ROTATOR CUFF REPAIR  BICEPS TENDOSIS ARTHROSCOPIC DISTAL CLAVICLE EXCISION  SUBACROMIAL DECOMP;  Surgeon: Leim Fabry, MD;  Location: [place];  Service: Orthopedics;  Laterality: Left;  [facility] WITH SPYDER SMITH & NEPHEW HEAD COIL ANCHOR FOOTPRINT ANCHOR QFIX ANCHOR   VISCERAL ANGIOGRAPHY N/A 08/14/2020   Procedure: VISCERAL ANGIOGRAPHY;  Surgeon: Algernon Huxley, MD;  Location: [place] CV LAB;  Service: Cardiovascular;  Laterality: N/A;   VISCERAL ANGIOGRAPHY N/A 09/03/2021   Procedure: VISCERAL ANGIOGRAPHY;  Surgeon: Algernon Huxley, MD;  Location: [place] CV LAB;  Service: Cardiovascular;  Laterality: N/A;    Prior to Admission medications   Medication Sig Start Date End Date Taking? Authorizing Provider  acetaminophen (TYLENOL) 325 MG tablet Take 2 tablets (650 mg total) by mouth every 6 (six) hours as  needed for mild pain, moderate pain, fever or headache (or Fever >/= 101). 07/21/21  Yes Lorella Nimrod, MD  amLODipine (NORVASC) 5 MG tablet Take 5 mg by mouth daily. 02/07/13  Yes [provider]  bisacodyl (DULCOLAX) 5 MG EC tablet Take 1 tablet (5 mg total) by mouth daily as needed for moderate constipation. 07/02/21  Yes Loleta Dicker, PA  cetirizine (ZYRTEC) 10 MG tablet Take 10 mg by mouth daily as needed for allergies.   Yes [provider]  citalopram (CELEXA) 20 MG tablet Take 20 mg by mouth daily. 04/23/19  Yes [provider]  clopidogrel (PLAVIX) 75 MG tablet Take 75 mg by  mouth daily.   Yes [provider]  ELIQUIS 5 MG TABS tablet TAKE 1 TABLET TWICE DAILY 10/07/21  Yes Sindy Guadeloupe, MD  famotidine (PEPCID) 20 MG tablet Take 1 tablet (20 mg total) by mouth at bedtime. 10/12/21 11/11/21 Yes Virgel Manifold, MD  HYDROcodone-acetaminophen (NORCO/VICODIN) 5-325 MG tablet Take 1 tablet by mouth every 6 (six) hours as needed for moderate pain or severe pain. 07/21/21  Yes Lorella Nimrod, MD  levothyroxine (SYNTHROID, LEVOTHROID) 75 MCG tablet Take 75 mcg by mouth daily.   Yes [provider]  losartan (COZAAR) 50 MG tablet Take 50 mg by mouth daily.    Yes [provider]  potassium chloride SA (KLOR-CON) 20 MEQ tablet Take 1 tablet (20 mEq total) by mouth 2 (two) times daily. 08/31/21  Yes Jacquelin Hawking, NP  senna (SENOKOT) 8.6 MG TABS tablet Take 1 tablet (8.6 mg total) by mouth 2 (two) times daily. 07/02/21  Yes Loleta Dicker, PA  solifenacin (VESICARE) 5 MG tablet Take 5 mg by mouth daily.   Yes [provider]  sucralfate (CARAFATE) 1 g tablet Take 1 tablet (1 g total) by mouth in the morning, at noon, in the evening, and at bedtime. 10/12/21 11/11/21 Yes Virgel Manifold, MD  triamterene-hydrochlorothiazide (MAXZIDE-25) 37.5-25 MG tablet Take 1 tablet by mouth daily. 07/10/20  Yes [provider]  pantoprazole (PROTONIX) 40 MG tablet Take 1 tablet (40 mg total) by mouth 2 (two) times daily. 10/12/21 11/11/21  Virgel Manifold, MD    Family History  Problem Relation Age of Onset   Breast cancer Neg Hx      Social History   Tobacco Use   Smoking status: Former    Packs/day: 1.50    Years: 20.00    Pack years: 30.00    Types: Cigarettes    Quit date: 1990    Years since quitting: 32.9   Smokeless tobacco: Never  Vaping Use   Vaping Use: Never used  Substance Use Topics   Alcohol use: No   Drug use: No    Allergies as of 10/12/2021 - Review Complete 10/12/2021  Allergen Reaction Noted    Bee venom Swelling 01/31/2018   Shellfish allergy Swelling 01/31/2018   Tamoxifen Hives 07/20/2018   Penicillins Hives, Rash, and Other (See Comments) 12/21/2012    Physical Examination:  Constitutional: General:   Alert,  Well-developed, well-nourished, pleasant and cooperative in NAD BP 119/71   Pulse 82   Temp (!) 97.4 F (36.3 C) (Oral)   Wt 183 lb (83 kg)   BMI 34.58 kg/m   Respiratory: Normal respiratory effort  Gastrointestinal:  Soft, non-tender and non-distended without masses, hepatosplenomegaly or hernias noted.  No guarding or rebound tenderness.     Cardiac: No clubbing or edema.  No cyanosis. Normal  posterior tibial pedal pulses noted.  Psych:  Alert and cooperative. Normal mood and affect.  Musculoskeletal:  Normal gait. Head normocephalic, atraumatic. Symmetrical without gross deformities. 5/5 Lower extremity strength bilaterally.  Skin: Warm. Intact without significant lesions or rashes. No jaundice.  Neck: Supple, trachea midline  Lymph: No cervical lymphadenopathy  Psych:  Alert and oriented x3, Alert and cooperative. Normal mood and affect.  Labs: CMP     Component Value Date/Time   NA 134 (L) 08/28/2021 1516   K 3.0 (L) 08/28/2021 1516   K 3.0 (L) 02/14/2014 1447   CL 96 (L) 08/28/2021 1516   CO2 27 08/28/2021 1516   GLUCOSE 110 (H) 08/28/2021 1516   BUN 17 08/28/2021 1516   CREATININE 2.01 (H) 08/28/2021 1516   CALCIUM 10.2 08/28/2021 1516   CALCIUM 10.1 08/28/2021 1516   PROT 7.7 08/28/2021 1516   ALBUMIN 4.0 08/28/2021 1516   AST 17 08/28/2021 1516   ALT 13 08/28/2021 1516   ALKPHOS 71 08/28/2021 1516   BILITOT 0.6 08/28/2021 1516   GFRNONAA 25 (L) 08/28/2021 1516   GFRAA 38 (L) 08/22/2020 1210   Lab Results  Component Value Date   WBC 8.3 08/28/2021   HGB 11.4 (L) 08/28/2021   HCT 32.7 (L) 08/28/2021   MCV 88.1 08/28/2021   PLT 330 08/28/2021    Imaging Studies:   Assessment and Plan:   JORIE ZEE is a 81 y.o.  y/o female Here for follow-up with her main complaint at this time being heartburn despite PPI use, requiring baking soda use daily  Patient is denying any abdominal pain, diarrhea at this time and is only concerned about her heartburn and having to use baking soda at least once a day  Patient is already on PPI twice daily and proper use 30 to 45 minutes before breakfast and dinner discussed.  We will add Pepcid at bedtime.  We will also start patient on sucralfate to see if it helps with her symptoms  Patient has had an upper endoscopy last year already.  Repeat upper endoscopy is unlikely to change management and would put this 80 year old at risks of procedure and sedation in the setting of recent hospitalizations.  I will go ahead and obtain upper GI series to rule out any significant findings or obstructive disease.  Since patient was recently hospitalized earlier this year, upper GI series would help evaluate for any underlying strictures that can develop from severe esophagitis that can occur in elderly people during illness or hospitalizations.  We will also obtain pH study given ongoing reflux symptoms despite full-dose PPI therapy  (Risks of PPI use were discussed with patient including bone loss, C. Diff diarrhea, pneumonia, infections, CKD, electrolyte abnormalities.  Pt. Verbalizes understanding and chooses to continue the medication.)   Dr Latoya Freeman

## 2021-10-12 NOTE — Patient Instructions (Signed)
I will be faxing referral to [facility] GI lab for [place] study. There phone number is [phone]

## 2021-10-13 ENCOUNTER — Telehealth: Payer: Self-pay

## 2021-10-13 NOTE — Telephone Encounter (Signed)
Called pt lmovm with Appt info  Upper GI scheduled at [facility] arrive at 9:30AM on November 30th... Cannot have anything to eat after 12 midnight the day before.

## 2021-10-20 ENCOUNTER — Ambulatory Visit: Payer: Medicare HMO | Admitting: Gastroenterology

## 2021-10-21 ENCOUNTER — Ambulatory Visit
Admission: RE | Admit: 2021-10-21 | Discharge: 2021-10-21 | Disposition: A | Discharge status: Home / Self Care | Payer: Medicare HMO | Source: Ambulatory Visit | Attending: Gastroenterology | Admitting: Gastroenterology

## 2021-10-21 ENCOUNTER — Other Ambulatory Visit: Payer: Self-pay

## 2021-10-21 ENCOUNTER — Other Ambulatory Visit: Payer: Self-pay | Admitting: Gastroenterology

## 2021-10-21 DIAGNOSIS — K219 Gastro-esophageal reflux disease without esophagitis: Secondary | ICD-10-CM | POA: Diagnosis not present

## 2021-10-28 ENCOUNTER — Telehealth: Payer: Self-pay | Admitting: Gastroenterology

## 2021-10-28 NOTE — Telephone Encounter (Signed)
Inbound call from pt's daughter requesting a call back stating her mother's symptoms has not improved. Please advise. Thank you.

## 2021-10-29 ENCOUNTER — Other Ambulatory Visit: Payer: Self-pay | Admitting: Gastroenterology

## 2021-10-29 MED ORDER — DEXLANSOPRAZOLE 60 MG PO CPDR: 60.0000 mg | DELAYED_RELEASE_CAPSULE | Freq: Every day | ORAL | 0 refills | Status: DC

## 2021-10-30 NOTE — Telephone Encounter (Signed)
I spoke to Freda Munro (daughter) and she is aware and expressed understanding

## 2021-11-05 ENCOUNTER — Telehealth: Payer: Self-pay

## 2021-11-05 NOTE — Telephone Encounter (Signed)
Daughter lmovm stating that he has called [facility] to schedule appt for referral placed 09/2021 and they did not have it and she could not schedule appt   Called Freda Munro lmovm letting her know I had faxed referral 4 times last month and I have refaxed today 4 times with receipt confirmed

## 2021-11-08 ENCOUNTER — Encounter: Payer: Self-pay | Admitting: Gastroenterology

## 2021-11-10 DIAGNOSIS — K30 Functional dyspepsia: Secondary | ICD-10-CM | POA: Diagnosis not present

## 2021-11-10 DIAGNOSIS — K219 Gastro-esophageal reflux disease without esophagitis: Secondary | ICD-10-CM | POA: Diagnosis not present

## 2021-11-10 DIAGNOSIS — R109 Unspecified abdominal pain: Secondary | ICD-10-CM | POA: Diagnosis not present

## 2021-11-10 DIAGNOSIS — N1832 Chronic kidney disease, stage 3b: Secondary | ICD-10-CM | POA: Diagnosis not present

## 2021-11-10 DIAGNOSIS — D631 Anemia in chronic kidney disease: Secondary | ICD-10-CM | POA: Diagnosis not present

## 2021-11-10 DIAGNOSIS — I129 Hypertensive chronic kidney disease with stage 1 through stage 4 chronic kidney disease, or unspecified chronic kidney disease: Secondary | ICD-10-CM | POA: Diagnosis not present

## 2021-11-10 DIAGNOSIS — N2581 Secondary hyperparathyroidism of renal origin: Secondary | ICD-10-CM | POA: Diagnosis not present

## 2021-11-24 IMAGING — US US RENAL
1 series · 14 of 25 positions shown · non-contrast
Comparison: CT 10/19/2019.  Ultrasound 08/06/2008.

CLINICAL DATA: Chronic renal disease.

EXAM:
RENAL / URINARY TRACT ULTRASOUND COMPLETE

[Series 1: us renal · 0.23mm/px · 14 of 48 slices shown]
[im 1/48]
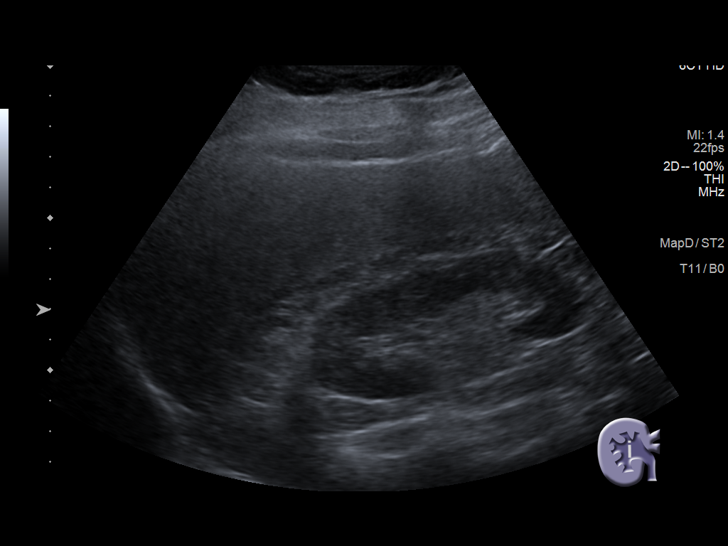
[im 4/48]
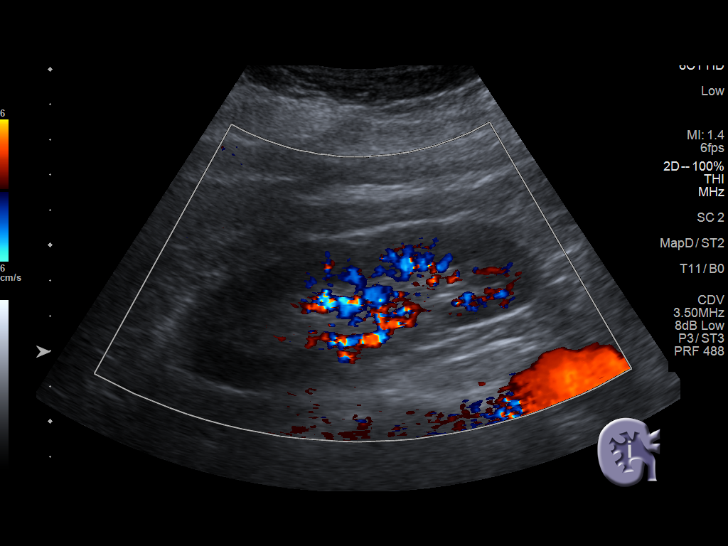
[im 8/48]
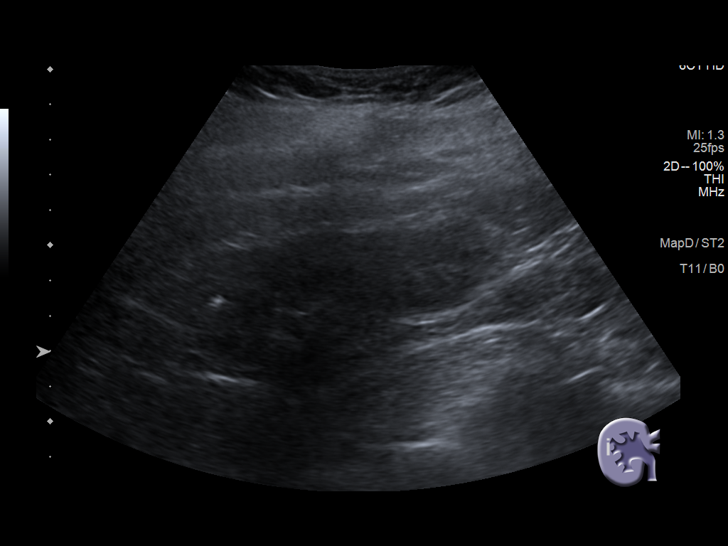
[im 12/48]
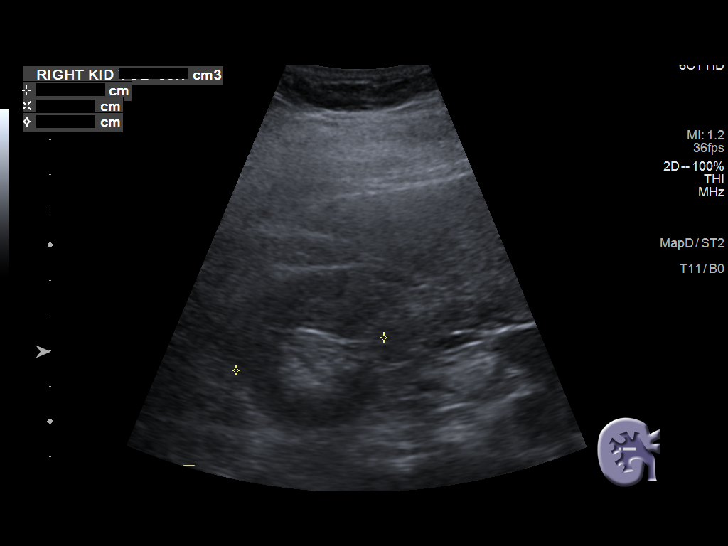
[im 16/48]
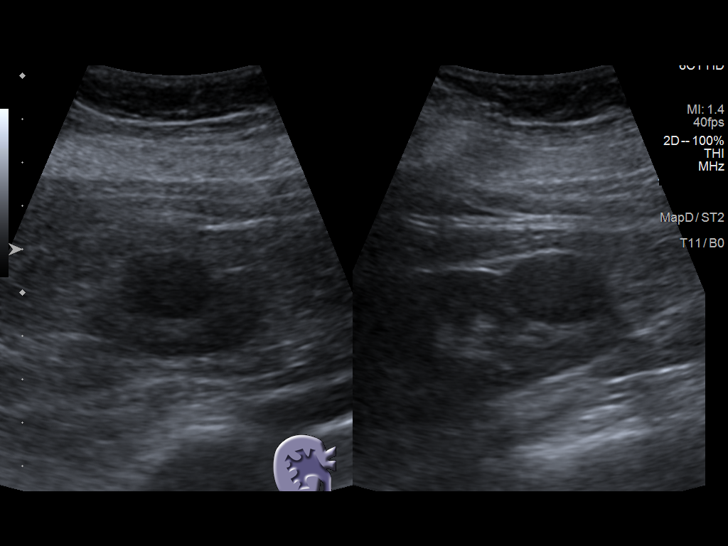
[im 18/48]
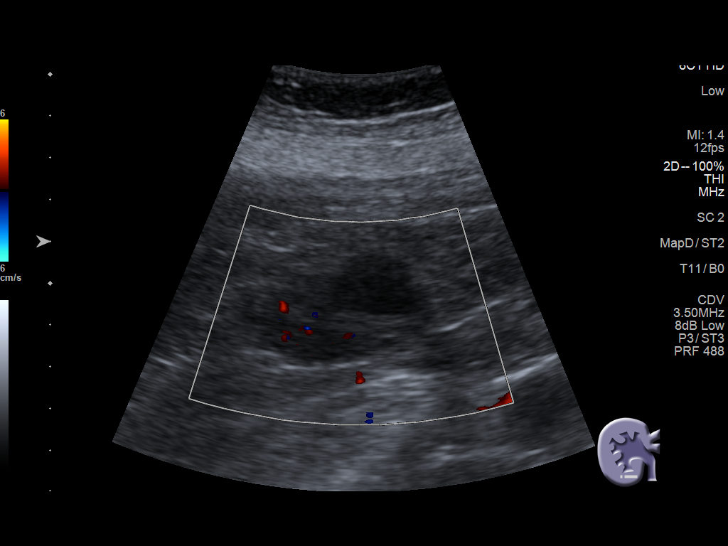
[im 22/48]
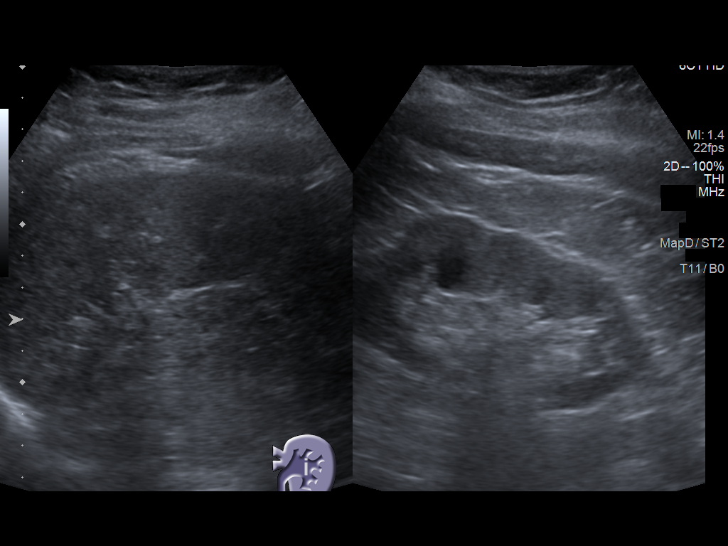
[im 26/48]
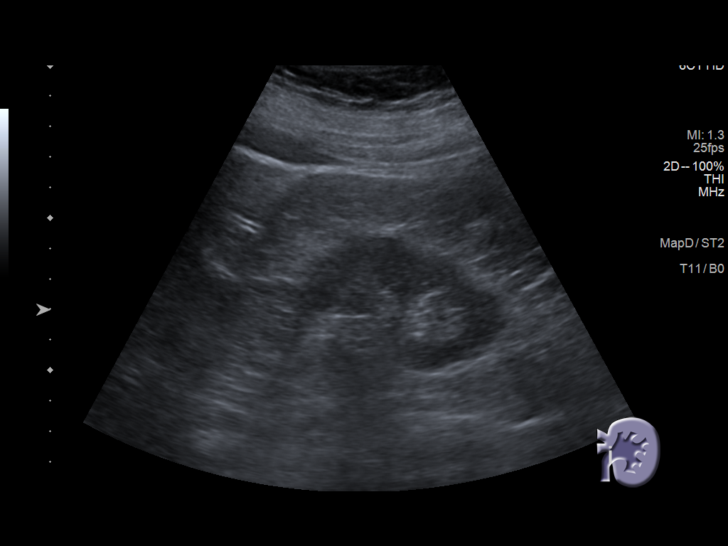
[im 30/48]
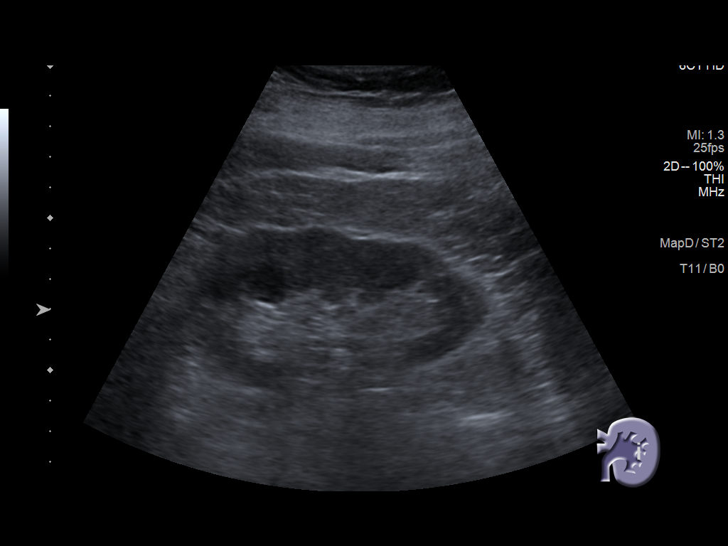
[im 32/48]
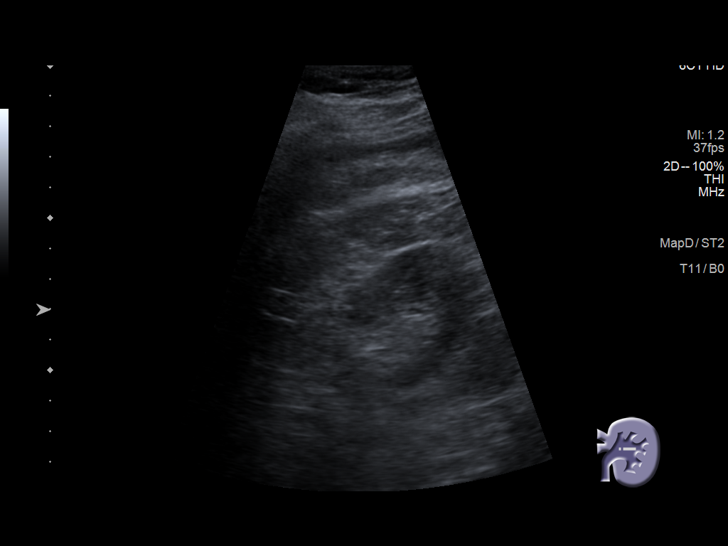
[im 36/48]
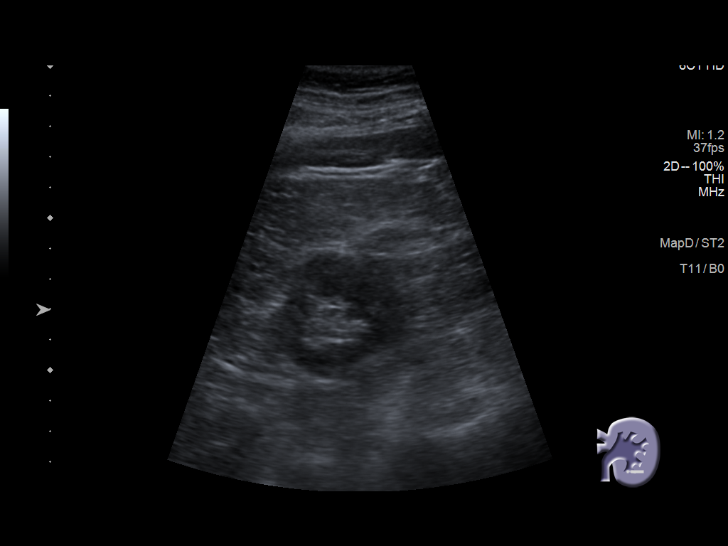
[im 40/48]
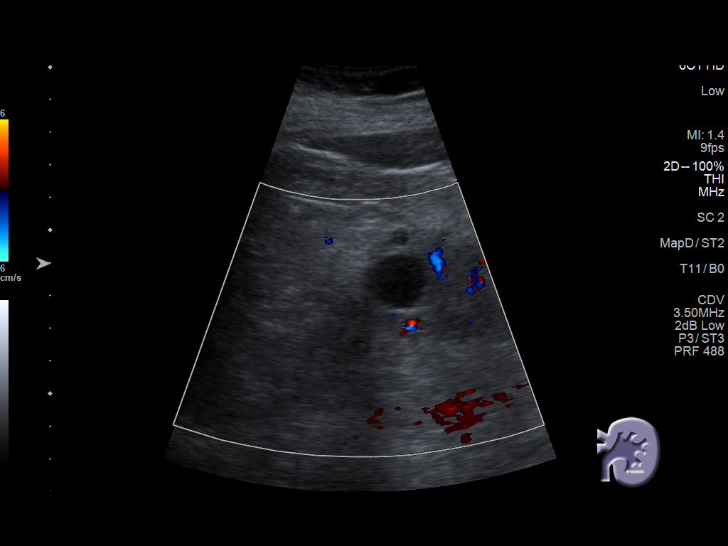
[im 44/48]
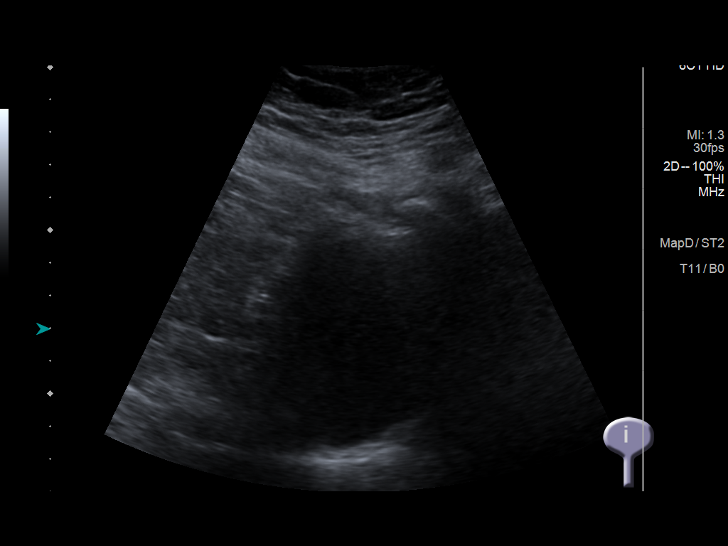
[im 48/48]
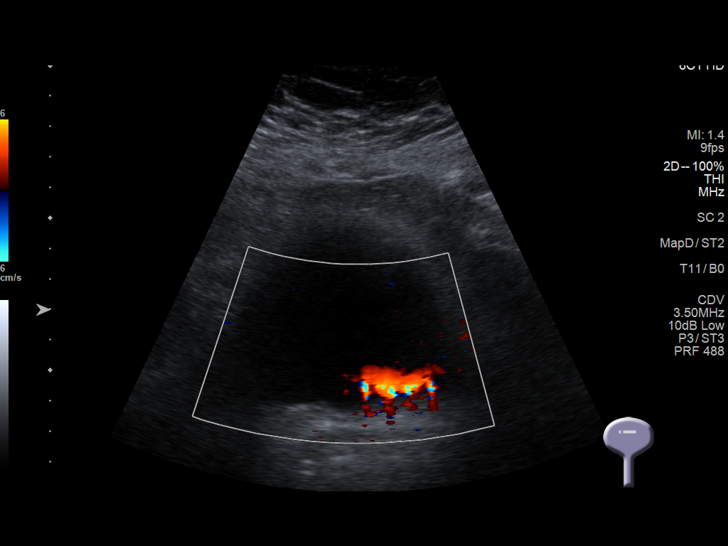

[14 of 25 positions shown; findings below may reference images not displayed]

FINDINGS: Right Kidney:

Renal measurements: 10.4 x 4.3 x 4.3 cm = volume: 100 mL .
Echogenicity within normal limits. No hydronephrosis. 2.2 cm right
upper pole probable cyst. Internal echoes may be present and to
confirm that this represents a cyst (as was noted on prior CT of
10/19/2019) renal MRI suggested. A 1.2 cm simple cyst also noted
within the right kidney.

Left Kidney:

Renal measurements: 9.8 x 4.7 x 5.1 cm = volume: 124.1 mL.
Echogenicity within normal limits. No hydronephrosis visualized. Two
simple cysts left kidney measuring 2.2 and 1.9 cm.

Bladder:

Appears normal for degree of bladder distention.

Other:

None.
IMPRESSION: 1. No acute abnormality identified. No hydronephrosis or bladder
distention.

2. 2 cm right upper renal pole probable cyst. Internal echoes may be
present and to confirm that this represents a cyst (as was noted on
prior CT of 10/19/2019) renal MRI suggested.

3.  Bilateral simple renal cysts.

## 2021-11-25 ENCOUNTER — Other Ambulatory Visit: Payer: Self-pay | Admitting: *Deleted

## 2021-11-25 DIAGNOSIS — D509 Iron deficiency anemia, unspecified: Secondary | ICD-10-CM

## 2021-11-26 ENCOUNTER — Other Ambulatory Visit: Payer: Self-pay

## 2021-12-01 ENCOUNTER — Telehealth: Payer: Self-pay

## 2021-12-01 ENCOUNTER — Telehealth: Payer: Self-pay | Admitting: Oncology

## 2021-12-01 ENCOUNTER — Inpatient Hospital Stay: Payer: Medicare HMO | Admitting: Oncology

## 2021-12-01 ENCOUNTER — Inpatient Hospital Stay: Payer: Medicare HMO

## 2021-12-01 NOTE — Telephone Encounter (Signed)
Daughter called to reschedule pt's appt for today. She is not feeling good. Call back at [phone]

## 2021-12-01 NOTE — Telephone Encounter (Signed)
Daughter Freda Munro left msg on VM requesting guidance on what to do next as the pH study has been done and the pt has not had any improvement since last OV  pH study below  Findings:       A multi-channel impedance, multi-channel esophageal pH catheter was       introduced through the left naris and passed into the esophagus. Probe       positioning was determined by LES-finder. The patient was instructed to       continue a normal lifestyle during the monitoring period. Reflux       medication(s) taken by the patient during the study consisted of the       following: proton pump inhibitor BID. The total duration of the       ambulatory pH monitoring study was 21 hours and 14 minutes.       LOWER ESOPHAGEAL ACID EXPOSURE:       - Lower Esophageal Acid Exposure Time(s) for pH < 4.0:       Upright Time: 0% (normal on PPI medications: less than 1.5% of time)       Recumbent Time: 0% (normal on PPI medications: less than 0.5% of time)       Total Time: 0% (normal on PPI medications: less than 1.3% of time)       GASTRIC ACID EXPOSURE:       - Gastric Acid Exposure Time(s) for pH < 4.0:       Upright Time: 39.8%       Recumbent Time: 56.1%       Total Time: 49.9%       DISTAL IMPEDANCE:       # Upright Events: 2 acid and 13 non-acid       # Recumbent Events: 1 acid and 9 non-acid       Total # Reflux Events: 25 total, Normal on Meds: <48       PROXIMAL IMPEDANCE:       # Upright Events: 5 non-acid       # Recumbent Events: 1 non-acid       Total # Reflux Events: 6 total       SYMPTOM ASSOCIATION:       - REGURGITATION: 1 episode included for analysis during the evaluable       period. 0 were related to reflux events. Symptom Index: 0%.       - NAUSEA: 1 episode included for analysis during the evaluable period. 0       were related to reflux events. Symptom Index: 0%.                                                                                  Impression:    - Control of the distal  esophageal acid was excellent.                         - Control of the gastric acid was excellent.                         - Normal absolute number  of gastroesophageal reflux                         events on PPI twice daily.                         - Negative symptom correlation for all symptoms.

## 2021-12-02 NOTE — Telephone Encounter (Signed)
Left message on voicemail.

## 2021-12-03 ENCOUNTER — Encounter: Payer: Self-pay | Admitting: Gastroenterology

## 2021-12-04 ENCOUNTER — Telehealth: Payer: Self-pay | Admitting: Oncology

## 2021-12-04 NOTE — Telephone Encounter (Signed)
Spoke with patient's daughter in regards to her appointment on 1/26. I requested she come in 2:15 instead of 1:30 due to a change in the Provider's scheduled. She was agreeable to this.

## 2021-12-07 NOTE — Telephone Encounter (Signed)
I spoke to pt's daughter Freda Munro, ok to speak to per [facility]...  She stated pt no longer takes Celexa and was never able to get Rx for sucralfate.  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

## 2021-12-07 NOTE — Addendum Note (Signed)
Addended by: Lurlean Nanny on: 12/07/2021 11:08 AM   Modules accepted: Orders

## 2021-12-11 NOTE — Telephone Encounter (Signed)
This is a Dr Bonna Gains pt.... I spoke to daughter to schedule f/u.... Daughter stated pt was never able to pick up Rx for Carafate.... I called pt's pharmacy and [facility]... They stated that the Sucralfate tablets are on back order and have been for months... However, they do have liquid....   The daughter would like to see if Rx could help pt and schedule a f/u for her to bring pt in for a visit further out as she will have to arrange to take off from work....   Please advise if you would be okay with me changing existing Rx from tablets to liquid? I will schedule a f/u visit as well

## 2021-12-11 NOTE — Addendum Note (Signed)
Addended by: Lurlean Nanny on: 12/11/2021 10:07 AM   Modules accepted: Orders

## 2021-12-12 DIAGNOSIS — Z0100 Encounter for examination of eyes and vision without abnormal findings: Secondary | ICD-10-CM | POA: Diagnosis not present

## 2021-12-15 MED ORDER — SUCRALFATE 1 GM/10ML PO SUSP: 1.0000 g | Freq: Three times a day (TID) | ORAL | 0 refills | Status: DC

## 2021-12-15 NOTE — Addendum Note (Signed)
Addended by: Lurlean Nanny on: 12/15/2021 08:18 AM   Modules accepted: Orders

## 2021-12-15 NOTE — Telephone Encounter (Signed)
Rx sent through [facility] for liquid  Called daughter no answer and vm full

## 2021-12-15 NOTE — Telephone Encounter (Signed)
Spoke to daughter and let her know that new Rx for liquid suspension was sent to [facility] as they had that in stock... f/u with Dr Allen Norris scheduled for January 28, 2022

## 2021-12-17 ENCOUNTER — Inpatient Hospital Stay: Payer: Medicare HMO | Attending: Oncology

## 2021-12-17 ENCOUNTER — Inpatient Hospital Stay (HOSPITAL_BASED_OUTPATIENT_CLINIC_OR_DEPARTMENT_OTHER): Payer: Medicare HMO | Admitting: Nurse Practitioner

## 2021-12-17 ENCOUNTER — Encounter: Payer: Self-pay | Admitting: Nurse Practitioner

## 2021-12-17 ENCOUNTER — Other Ambulatory Visit: Payer: Self-pay

## 2021-12-17 ENCOUNTER — Other Ambulatory Visit: Payer: Medicare HMO

## 2021-12-17 ENCOUNTER — Inpatient Hospital Stay: Payer: Medicare HMO

## 2021-12-17 ENCOUNTER — Ambulatory Visit: Payer: Medicare HMO | Admitting: Oncology

## 2021-12-17 VITALS — BP 122/58 | HR 65 | Temp 97.8°F | Resp 20 | Wt 185.6 lb

## 2021-12-17 DIAGNOSIS — Z7901 Long term (current) use of anticoagulants: Secondary | ICD-10-CM | POA: Diagnosis not present

## 2021-12-17 DIAGNOSIS — N1832 Chronic kidney disease, stage 3b: Secondary | ICD-10-CM

## 2021-12-17 DIAGNOSIS — Z86711 Personal history of pulmonary embolism: Secondary | ICD-10-CM | POA: Diagnosis not present

## 2021-12-17 DIAGNOSIS — R5383 Other fatigue: Secondary | ICD-10-CM | POA: Diagnosis not present

## 2021-12-17 DIAGNOSIS — D631 Anemia in chronic kidney disease: Secondary | ICD-10-CM | POA: Insufficient documentation

## 2021-12-17 DIAGNOSIS — E039 Hypothyroidism, unspecified: Secondary | ICD-10-CM | POA: Insufficient documentation

## 2021-12-17 DIAGNOSIS — D509 Iron deficiency anemia, unspecified: Secondary | ICD-10-CM | POA: Insufficient documentation

## 2021-12-17 DIAGNOSIS — Z79899 Other long term (current) drug therapy: Secondary | ICD-10-CM | POA: Insufficient documentation

## 2021-12-17 DIAGNOSIS — D508 Other iron deficiency anemias: Secondary | ICD-10-CM

## 2021-12-17 LAB — CBC WITH DIFFERENTIAL/PLATELET
Abs Immature Granulocytes: 0.02 10*3/uL (ref 0.00–0.07)
Basophils Absolute: 0 10*3/uL (ref 0.0–0.1)
Basophils Relative: 1 %
Eosinophils Absolute: 0 10*3/uL (ref 0.0–0.5)
Eosinophils Relative: 1 %
HCT: 31.9 % — ABNORMAL LOW (ref 36.0–46.0)
Hemoglobin: 10.3 g/dL — ABNORMAL LOW (ref 12.0–15.0)
Immature Granulocytes: 0 %
Lymphocytes Relative: 33 %
Lymphs Abs: 2.1 10*3/uL (ref 0.7–4.0)
MCH: 25.8 pg — ABNORMAL LOW (ref 26.0–34.0)
MCHC: 32.3 g/dL (ref 30.0–36.0)
MCV: 79.8 fL — ABNORMAL LOW (ref 80.0–100.0)
Monocytes Absolute: 0.6 10*3/uL (ref 0.1–1.0)
Monocytes Relative: 9 %
Neutro Abs: 3.6 10*3/uL (ref 1.7–7.7)
Neutrophils Relative %: 56 %
Platelets: 286 10*3/uL (ref 150–400)
RBC: 4 MIL/uL (ref 3.87–5.11)
RDW: 13.4 % (ref 11.5–15.5)
WBC: 6.4 10*3/uL (ref 4.0–10.5)
nRBC: 0 % (ref 0.0–0.2)

## 2021-12-17 LAB — IRON AND TIBC
Iron: 35 ug/dL (ref 28–170)
Saturation Ratios: 8 % — ABNORMAL LOW (ref 10.4–31.8)
TIBC: 414 ug/dL (ref 250–450)
UIBC: 379 ug/dL

## 2021-12-17 LAB — COMPREHENSIVE METABOLIC PANEL
ALT: 11 U/L (ref 0–44)
AST: 17 U/L (ref 15–41)
Albumin: 3.8 g/dL (ref 3.5–5.0)
Alkaline Phosphatase: 63 U/L (ref 38–126)
Anion gap: 8 (ref 5–15)
BUN: 19 mg/dL (ref 8–23)
CO2: 28 mmol/L (ref 22–32)
Calcium: 9.9 mg/dL (ref 8.9–10.3)
Chloride: 103 mmol/L (ref 98–111)
Creatinine, Ser: 1.47 mg/dL — ABNORMAL HIGH (ref 0.44–1.00)
GFR, Estimated: 36 mL/min — ABNORMAL LOW (ref >60)
Glucose, Bld: 99 mg/dL (ref 70–99)
Potassium: 3.6 mmol/L (ref 3.5–5.1)
Sodium: 139 mmol/L (ref 135–145)
Total Bilirubin: 0.5 mg/dL (ref 0.3–1.2)
Total Protein: 7.1 g/dL (ref 6.5–8.1)

## 2021-12-17 LAB — FERRITIN: Ferritin: 6 ng/mL — ABNORMAL LOW (ref 11–307)

## 2021-12-17 MED ORDER — IRON SUCROSE 20 MG/ML IV SOLN
200.0000 mg | Freq: Once | INTRAVENOUS | Status: AC
  Administered 2021-12-17: 200 mg via INTRAVENOUS
  Filled 2021-12-17: qty 10

## 2021-12-17 MED ORDER — SODIUM CHLORIDE 0.9 % IV SOLN
INTRAVENOUS | Status: DC
  Filled 2021-12-17: qty 250

## 2021-12-17 MED ORDER — SODIUM CHLORIDE 0.9 % IV SOLN: 200.0000 mg | Freq: Once | INTRAVENOUS | Status: DC

## 2021-12-17 NOTE — Progress Notes (Signed)
Hematology/Oncology Consult Note [facility]  Telephone:([phone] [phone]  Patient Care Team: [name], MD as PCP - General (Family Medicine) Sindy Guadeloupe, MD as Consulting Physician (Oncology)   Name of the patient: Latoya Freeman  [id number]  July 06, 1941   Date of visit: 12/17/21  Diagnosis- 1.  History of unprovoked PE on Eliquis 2.  Anemia likely due to iron deficiency  Chief complaint/ Reason for visit-routine follow-up for anemia  Heme/Onc history:  Patient is a 81 year old female who was seen by me in June 2020.  She was found to have unprovoked PE in December 2019 and has been on Eliquis since then.  She has now been referred to me for anemia.  Most recent CBC from 07/20/2020 showed H&H of 9.7/29 with an MCV of 83 ferritin levels were low at 12 and iron saturation low at 14%.  Patient has been following up with Dr. 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  She was evaluated by vascular surgery at [facility] for possible intervention of this ligament and subsequently underwent surgery with resolution of symptoms  Interval history-Latoya Freeman is a 81 year old female who presents today for follow-up.  She continues Plavix and Eliquis and denies any bleeding. She continues to recover from effects of meningitis after back surgery. She denies bleeding or black stools. Feels fatigued. Some leg cramping and generalized weakness. She gets short of breath with exertion. Continues to rely on daughter for assistance with errands, appointments, and chores.    ECOG PS- 2 Pain scale- 0   Review of systems- Review of Systems  Constitutional:  Positive for malaise/fatigue. Negative for chills, fever and  weight loss.  HENT:  Negative for congestion, ear discharge, ear pain, sinus pain, sore throat and tinnitus.   Eyes: Negative.   Respiratory: Negative.  Negative for cough, sputum production and shortness of breath.   Cardiovascular:  Negative for chest pain, palpitations, orthopnea, claudication and leg swelling.  Gastrointestinal:  Negative for abdominal pain, blood in stool, constipation, diarrhea, heartburn, nausea and vomiting.  Genitourinary: Negative.   Musculoskeletal: Negative.   Skin: Negative.   Neurological:  Negative for dizziness, tingling, weakness and headaches.  Endo/Heme/Allergies: Negative.   Psychiatric/Behavioral: Negative.       Allergies  Allergen Reactions   Bee Venom Swelling   Shellfish Allergy Swelling   Tamoxifen Hives   Penicillins Hives, Rash and Other (See Comments)    Has patient had a PCN reaction causing immediate rash, facial/tongue/throat swelling, SOB or lightheadedness with hypotension: Unknown Has patient had a PCN reaction causing severe rash involving mucus membranes or skin necrosis: Unknown Has patient had a PCN reaction that required hospitalization: Unknown Has patient had a PCN reaction occurring within the last 10 years: No If all of the above answers are "NO", then may proceed with Cephalosporin use.      Past Medical History:  Diagnosis Date   Aneurysm ([place]) 1980   Arthritis    Cancer ([place])    GERD (gastroesophageal reflux disease)    Hypertension 1980   Hypothyroidism 1999   Pneumonia    Pulmonary embolism ([place])    Shingles    Wears dentures    full upper and lower     Past Surgical History:  Procedure Laterality Date  BRAIN SURGERY     aneurysm   BREAST CYST ASPIRATION Left 2005   BREAST EXCISIONAL BIOPSY Left 2013   atypical ductal hyperplasia   BREAST SURGERY  2013   LF Breast Wide EXC    CHOLECYSTECTOMY  2004   COLONOSCOPY     COLONOSCOPY WITH PROPOFOL N/A 01/31/2018   Procedure: COLONOSCOPY WITH PROPOFOL;   Surgeon: Lollie Sails, MD;  Location: [facility] ENDOSCOPY;  Service: Endoscopy;  Laterality: N/A;   COLONOSCOPY WITH PROPOFOL N/A 08/06/2020   Procedure: COLONOSCOPY WITH PROPOFOL;  Surgeon: Virgel Manifold, MD;  Location: [facility] ENDOSCOPY;  Service: Endoscopy;  Laterality: N/A;   DILATION AND CURETTAGE OF UTERUS N/A 05/03/2019   Procedure: DILATATION AND CURETTAGE;  Surgeon: Gae Dry, MD;  Location: [facility] ORS;  Service: Gynecology;  Laterality: N/A;   ESOPHAGOGASTRODUODENOSCOPY N/A 01/31/2018   Procedure: ESOPHAGOGASTRODUODENOSCOPY (EGD);  Surgeon: Lollie Sails, MD;  Location: [facility] ENDOSCOPY;  Service: Endoscopy;  Laterality: N/A;   ESOPHAGOGASTRODUODENOSCOPY (EGD) WITH PROPOFOL N/A 08/06/2020   Procedure: ESOPHAGOGASTRODUODENOSCOPY (EGD) WITH PROPOFOL;  Surgeon: Virgel Manifold, MD;  Location: [facility] ENDOSCOPY;  Service: Endoscopy;  Laterality: N/A;   LUMBAR LAMINECTOMY/DECOMPRESSION MICRODISCECTOMY N/A 07/01/2021   Procedure: LEFT L3-4 MICRODSICECTOMY, L4-S1 DECOMPRESSION;  Surgeon: Meade Maw, MD;  Location: [facility] ORS;  Service: Neurosurgery;  Laterality: N/A;   LUMBAR PUNCTURE     SHOULDER ARTHROSCOPY Left 07/25/2018   Procedure: SHOULDER MINI OPEN ROTATOR CUFF REPAIR  BICEPS TENDOSIS ARTHROSCOPIC DISTAL CLAVICLE EXCISION  SUBACROMIAL DECOMP;  Surgeon: Leim Fabry, MD;  Location: [place];  Service: Orthopedics;  Laterality: Left;  [facility] WITH SPYDER SMITH & NEPHEW HEAD COIL ANCHOR FOOTPRINT ANCHOR QFIX ANCHOR   VISCERAL ANGIOGRAPHY N/A 08/14/2020   Procedure: VISCERAL ANGIOGRAPHY;  Surgeon: Algernon Huxley, MD;  Location: [place] CV LAB;  Service: Cardiovascular;  Laterality: N/A;   VISCERAL ANGIOGRAPHY N/A 09/03/2021   Procedure: VISCERAL ANGIOGRAPHY;  Surgeon: Algernon Huxley, MD;  Location: [place] CV LAB;  Service: Cardiovascular;  Laterality: N/A;    Social History   Socioeconomic History   Marital status: Divorced    Spouse  name: Not on file   Number of children: Not on file   Years of education: Not on file   Highest education level: Not on file  Occupational History   Not on file  Tobacco Use   Smoking status: Former    Packs/day: 1.50    Years: 20.00    Pack years: 30.00    Types: Cigarettes    Quit date: 91    Years since quitting: 33.0   Smokeless tobacco: Never  Vaping Use   Vaping Use: Never used  Substance and Sexual Activity   Alcohol use: No   Drug use: No   Sexual activity: Not on file  Other Topics Concern   Not on file  Social History Narrative   Lives alone   Social Determinants of Health   Financial Resource Strain: Not on file  Food Insecurity: Not on file  Transportation Needs: Not on file  Physical Activity: Not on file  Stress: Not on file  Social Connections: Not on file  Intimate Partner Violence: Not on file    Family History  Problem Relation Age of Onset   Breast cancer Neg Hx      Current Outpatient Medications:    acetaminophen (TYLENOL) 325 MG tablet, Take 2 tablets (650 mg total) by mouth every 6 (six) hours as needed for mild pain, moderate pain, fever or  headache (or Fever >/= 101)., Disp: , Rfl:    amLODipine (NORVASC) 5 MG tablet, Take 5 mg by mouth daily., Disp: , Rfl:    bisacodyl (DULCOLAX) 5 MG EC tablet, Take 1 tablet (5 mg total) by mouth daily as needed for moderate constipation., Disp: 30 tablet, Rfl: 0   cetirizine (ZYRTEC) 10 MG tablet, Take 10 mg by mouth daily as needed for allergies., Disp: , Rfl:    clopidogrel (PLAVIX) 75 MG tablet, Take 75 mg by mouth daily., Disp: , Rfl:    dexlansoprazole (DEXILANT) 60 MG capsule, Take 1 capsule (60 mg total) by mouth daily., Disp: 30 capsule, Rfl: 0   ELIQUIS 5 MG TABS tablet, TAKE 1 TABLET TWICE DAILY, Disp: 180 tablet, Rfl: 3   famotidine (PEPCID) 20 MG tablet, Take 1 tablet (20 mg total) by mouth at bedtime., Disp: 30 tablet, Rfl: 0   HYDROcodone-acetaminophen (NORCO/VICODIN) 5-325 MG tablet,  Take 1 tablet by mouth every 6 (six) hours as needed for moderate pain or severe pain., Disp: 30 tablet, Rfl: 0   levothyroxine (SYNTHROID, LEVOTHROID) 75 MCG tablet, Take 75 mcg by mouth daily., Disp: , Rfl:    losartan (COZAAR) 50 MG tablet, Take 50 mg by mouth daily. , Disp: , Rfl:    potassium chloride SA (KLOR-CON) 20 MEQ tablet, Take 1 tablet (20 mEq total) by mouth 2 (two) times daily., Disp: 14 tablet, Rfl: 0   senna (SENOKOT) 8.6 MG TABS tablet, Take 1 tablet (8.6 mg total) by mouth 2 (two) times daily., Disp: 120 tablet, Rfl: 0   solifenacin (VESICARE) 5 MG tablet, Take 5 mg by mouth daily., Disp: , Rfl:    sucralfate (CARAFATE) 1 GM/10ML suspension, Take 10 mLs (1 g total) by mouth 4 (four) times daily -  with meals and at bedtime., Disp: 828 mL, Rfl: 0   triamterene-hydrochlorothiazide (MAXZIDE-25) 37.5-25 MG tablet, Take 1 tablet by mouth daily., Disp: , Rfl:   Physical exam:  Vitals:   12/17/21 1353  BP: (!) 122/58  Pulse: 65  Resp: 20  Temp: 97.8 F (36.6 C)  SpO2: 100%  Weight: 185 lb 9.6 oz (84.2 kg)   Physical Exam Constitutional:      General: She is not in acute distress.    Appearance: She is well-developed. She is not ill-appearing.  HENT:     Mouth/Throat:     Pharynx: No oropharyngeal exudate.  Eyes:     General: No scleral icterus. Cardiovascular:     Rate and Rhythm: Normal rate and regular rhythm.  Pulmonary:     Effort: Pulmonary effort is normal.     Breath sounds: Normal breath sounds.  Abdominal:     General: There is no distension.     Palpations: Abdomen is soft.     Tenderness: There is no abdominal tenderness. There is no rebound.  Musculoskeletal:        General: No tenderness or deformity.     Right lower leg: No edema.     Left lower leg: No edema.  Skin:    General: Skin is warm and dry.     Coloration: Skin is not pale.  Neurological:     General: No focal deficit present.     Mental Status: She is alert and oriented to person,  place, and time.     Motor: No weakness.  Psychiatric:        Mood and Affect: Mood normal.        Behavior: Behavior normal.  CMP Latest Ref Rng & Units 08/28/2021  Glucose 70 - 99 mg/dL 110(H)  BUN 8 - 23 mg/dL 17  Creatinine 0.44 - 1.00 mg/dL 2.01(H)  Sodium 135 - 145 mmol/L 134(L)  Potassium 3.5 - 5.1 mmol/L 3.0(L)  Chloride 98 - 111 mmol/L 96(L)  CO2 22 - 32 mmol/L 27  Calcium 8.7 - 10.3 mg/dL 10.1  Total Protein 6.5 - 8.1 g/dL 7.7  Total Bilirubin 0.3 - 1.2 mg/dL 0.6  Alkaline Phos 38 - 126 U/L 71  AST 15 - 41 U/L 17  ALT 0 - 44 U/L 13   CBC Latest Ref Rng & Units 08/28/2021  WBC 4.0 - 10.5 K/uL 8.3  Hemoglobin 12.0 - 15.0 g/dL 11.4(L)  Hematocrit 36.0 - 46.0 % 32.7(L)  Platelets 150 - 400 K/uL 330   Iron/TIBC/Ferritin/ %Sat    Component Value Date/Time   IRON 58 08/28/2021 1516   IRON 49 07/29/2020 1519   TIBC 321 08/28/2021 1516   TIBC 361 07/29/2020 1519   FERRITIN 75 08/28/2021 1516   FERRITIN 12 (L) 07/29/2020 1519   IRONPCTSAT 18 08/28/2021 1516   IRONPCTSAT 14 (L) 07/29/2020 1519      Assessment and plan- Patient is a 81 y.o. female   Iron deficiency anemia- last venofer 03/18/21. Likely related to CKD as previous GI workup has been unrevealing. Has hx of gastritis which may also be contributing and is managed by Dr. Bonna Gains. Hmg 10.3, microcytic. Ferritin 6, iron sat 8%. Unable to tolerate oral iron. Recommend she continue IV iron. Plan for venofer x 5. She will receive first dose today.   CKD stage 3b- baseline creatinine between 1.0-2. Encouraged her to discuss with nephrology and pcp. Reviewed that we'd consider ESAs if hmg consistently < 10. Monitor.   Bacterial meningitis- s/p IV antibiotics. Continuing to recover.   Hypokalemia- resolved  History of unprovoked PE-continue Eliquis.   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 Iron deficiency anemia, unspecified iron deficiency anemia type   2. Anemia in stage 3b chronic kidney disease ([place])     Beckey Rutter, DNP, AGNP-C [place] at [facility] [phone] (clinic) 12/17/2021

## 2021-12-17 NOTE — Patient Instructions (Signed)
[facility] [place]  Discharge Instructions: ?Thank you for choosing [facility] to provide your oncology and hematology care.  ?If you have a lab appointment with the Cancer Center, please go directly to the Cancer Center and check in at the registration area. ? ?Wear comfortable clothing and clothing appropriate for easy access to any Portacath or PICC line.  ? ?We strive to give you quality time with your provider. You may need to reschedule your appointment if you arrive late (15 or more minutes).  Arriving late affects you and other patients whose appointments are after yours.  Also, if you miss three or more appointments without notifying the office, you may be dismissed from the clinic at the provider?s discretion.    ?  ?For prescription refill requests, have your pharmacy contact our office and allow 72 hours for refills to be completed.   ? ?Today you received the following chemotherapy and/or immunotherapy agents VENOFER    ?  ?To help prevent nausea and vomiting after your treatment, we encourage you to take your nausea medication as directed. ? ?BELOW ARE SYMPTOMS THAT SHOULD BE REPORTED IMMEDIATELY: ?*FEVER GREATER THAN 100.4 F (38 ?C) OR HIGHER ?*CHILLS OR SWEATING ?*NAUSEA AND VOMITING THAT IS NOT CONTROLLED WITH YOUR NAUSEA MEDICATION ?*UNUSUAL SHORTNESS OF BREATH ?*UNUSUAL BRUISING OR BLEEDING ?*URINARY PROBLEMS (pain or burning when urinating, or frequent urination) ?*BOWEL PROBLEMS (unusual diarrhea, constipation, pain near the anus) ?TENDERNESS IN MOUTH AND THROAT WITH OR WITHOUT PRESENCE OF ULCERS (sore throat, sores in mouth, or a toothache) ?UNUSUAL RASH, SWELLING OR PAIN  ?UNUSUAL VAGINAL DISCHARGE OR ITCHING  ? ?Items with * indicate a potential emergency and should be followed up as soon as possible or go to the Emergency Department if any problems should occur. ? ?Please show the CHEMOTHERAPY ALERT CARD or IMMUNOTHERAPY ALERT CARD at check-in to the  Emergency Department and triage nurse. ? ?Should you have questions after your visit or need to cancel or reschedule your appointment, please contact [facility] [place]  [phone] and follow the prompts.  Office hours are 8:00 a.m. to 4:30 p.m. Monday - Friday. Please note that voicemails left after 4:00 p.m. may not be returned until the following business day.  We are closed weekends and major holidays. You have access to a nurse at all times for urgent questions. Please call the main number to the clinic [phone] and follow the prompts. ? ?For any non-urgent questions, you may also contact your provider using MyChart. We now offer e-Visits for anyone 18 and older to request care online for non-urgent symptoms. For details visit [email or web address]. ?  ?Also download the MyChart app! Go to the app store, search "MyChart", open the app, select , and log in with your MyChart username and password. ? ?Due to Covid, a mask is required upon entering the hospital/clinic. If you do not have a mask, one will be given to you upon arrival. For doctor visits, patients may have 1 support person aged 18 or older with them. For treatment visits, patients cannot have anyone with them due to current Covid guidelines and our immunocompromised population.  ? ?Iron Sucrose Injection ?What is this medication? ?IRON SUCROSE (EYE ern SOO krose) treats low levels of iron (iron deficiency anemia) in people with kidney disease. Iron is a mineral that plays an important role in making red blood cells, which carry oxygen from your lungs to the rest of your body. ?This medicine may   be used for other purposes; ask your health care provider or pharmacist if you have questions. ?COMMON BRAND NAME(S): Venofer ?What should I tell my care team before I take this medication? ?They need to know if you have any of these conditions: ?Anemia not caused by low iron levels ?Heart disease ?High levels of  iron in the blood ?Kidney disease ?Liver disease ?An unusual or allergic reaction to iron, other medications, foods, dyes, or preservatives ?Pregnant or trying to get pregnant ?Breast-feeding ?How should I use this medication? ?This medication is for infusion into a vein. It is given in a hospital or clinic setting. ?Talk to your care team about the use of this medication in children. While this medication may be prescribed for children as young as 2 years for selected conditions, precautions do apply. ?Overdosage: If you think you have taken too much of this medicine contact a poison control center or emergency room at once. ?NOTE: This medicine is only for you. Do not share this medicine with others. ?What if I miss a dose? ?It is important not to miss your dose. Call your care team if you are unable to keep an appointment. ?What may interact with this medication? ?Do not take this medication with any of the following: ?Deferoxamine ?Dimercaprol ?Other iron products ?This medication may also interact with the following: ?Chloramphenicol ?Deferasirox ?This list may not describe all possible interactions. Give your health care provider a list of all the medicines, herbs, non-prescription drugs, or dietary supplements you use. Also tell them if you smoke, drink alcohol, or use illegal drugs. Some items may interact with your medicine. ?What should I watch for while using this medication? ?Visit your care team regularly. Tell your care team if your symptoms do not start to get better or if they get worse. You may need blood work done while you are taking this medication. ?You may need to follow a special diet. Talk to your care team. Foods that contain iron include: whole grains/cereals, dried fruits, beans, or peas, leafy green vegetables, and organ meats (liver, kidney). ?What side effects may I notice from receiving this medication? ?Side effects that you should report to your care team as soon as  possible: ?Allergic reactions--skin rash, itching, hives, swelling of the face, lips, tongue, or throat ?Low blood pressure--dizziness, feeling faint or lightheaded, blurry vision ?Shortness of breath ?Side effects that usually do not require medical attention (report to your care team if they continue or are bothersome): ?Flushing ?Headache ?Joint pain ?Muscle pain ?Nausea ?Pain, redness, or irritation at injection site ?This list may not describe all possible side effects. Call your doctor for medical advice about side effects. You may report side effects to FDA at 1-800-FDA-1088. ?Where should I keep my medication? ?This medication is given in a hospital or clinic and will not be stored at home. ?NOTE: This sheet is a summary. It may not cover all possible information. If you have questions about this medicine, talk to your doctor, pharmacist, or health care provider. ?? 2022 Elsevier/Gold Standard (2021-04-03 00:00:00) ? ?

## 2021-12-18 ENCOUNTER — Telehealth: Payer: Self-pay | Admitting: *Deleted

## 2021-12-18 ENCOUNTER — Other Ambulatory Visit: Payer: Self-pay | Admitting: *Deleted

## 2021-12-18 NOTE — Progress Notes (Signed)
Called pt with appts. She already had 1 on 1/26. Anderson Malta made appts. I had to leave a message about dates.

## 2021-12-18 NOTE — Telephone Encounter (Signed)
Called pt and got voicemail and let her know that she had the 1 dose of venofer on 1/26 and all labs come back and she does need a full 5 infusions of venofer. The scheduler has made appts

## 2021-12-18 NOTE — Telephone Encounter (Signed)
-----   Message from Sindy Guadeloupe, MD sent at 12/18/2021  8:39 AM EST ----- Needs iv iron

## 2021-12-18 NOTE — Telephone Encounter (Signed)
For 2/2, 2/9, 2/13, 2/17 all at 2 pm. If for anyreason you can't do these dates to please call. Left my direct number

## 2021-12-24 ENCOUNTER — Inpatient Hospital Stay: Payer: Medicare HMO | Attending: Oncology

## 2021-12-24 ENCOUNTER — Other Ambulatory Visit: Payer: Self-pay

## 2021-12-24 VITALS — BP 120/70 | HR 64 | Temp 97.0°F | Resp 20

## 2021-12-24 DIAGNOSIS — R5383 Other fatigue: Secondary | ICD-10-CM | POA: Insufficient documentation

## 2021-12-24 DIAGNOSIS — E039 Hypothyroidism, unspecified: Secondary | ICD-10-CM | POA: Insufficient documentation

## 2021-12-24 DIAGNOSIS — D509 Iron deficiency anemia, unspecified: Secondary | ICD-10-CM | POA: Insufficient documentation

## 2021-12-24 DIAGNOSIS — N1832 Chronic kidney disease, stage 3b: Secondary | ICD-10-CM | POA: Insufficient documentation

## 2021-12-24 DIAGNOSIS — Z79899 Other long term (current) drug therapy: Secondary | ICD-10-CM | POA: Insufficient documentation

## 2021-12-24 DIAGNOSIS — D631 Anemia in chronic kidney disease: Secondary | ICD-10-CM | POA: Insufficient documentation

## 2021-12-24 DIAGNOSIS — Z7901 Long term (current) use of anticoagulants: Secondary | ICD-10-CM | POA: Insufficient documentation

## 2021-12-24 DIAGNOSIS — D508 Other iron deficiency anemias: Secondary | ICD-10-CM

## 2021-12-24 DIAGNOSIS — Z86711 Personal history of pulmonary embolism: Secondary | ICD-10-CM | POA: Insufficient documentation

## 2021-12-24 MED ORDER — SODIUM CHLORIDE 0.9 % IV SOLN: 200.0000 mg | Freq: Once | INTRAVENOUS | Status: DC

## 2021-12-24 MED ORDER — SODIUM CHLORIDE 0.9 % IV SOLN
INTRAVENOUS | Status: DC
  Filled 2021-12-24: qty 250

## 2021-12-24 MED ORDER — IRON SUCROSE 20 MG/ML IV SOLN
200.0000 mg | Freq: Once | INTRAVENOUS | Status: AC
  Administered 2021-12-24: 200 mg via INTRAVENOUS
  Filled 2021-12-24: qty 10

## 2021-12-24 NOTE — Patient Instructions (Signed)

## 2021-12-31 ENCOUNTER — Other Ambulatory Visit: Payer: Self-pay

## 2021-12-31 ENCOUNTER — Inpatient Hospital Stay: Payer: Medicare HMO

## 2021-12-31 VITALS — BP 102/54 | HR 73 | Temp 97.2°F | Resp 18

## 2021-12-31 DIAGNOSIS — Z7901 Long term (current) use of anticoagulants: Secondary | ICD-10-CM | POA: Diagnosis not present

## 2021-12-31 DIAGNOSIS — R5383 Other fatigue: Secondary | ICD-10-CM | POA: Diagnosis not present

## 2021-12-31 DIAGNOSIS — D631 Anemia in chronic kidney disease: Secondary | ICD-10-CM | POA: Diagnosis not present

## 2021-12-31 DIAGNOSIS — D509 Iron deficiency anemia, unspecified: Secondary | ICD-10-CM | POA: Diagnosis not present

## 2021-12-31 DIAGNOSIS — E039 Hypothyroidism, unspecified: Secondary | ICD-10-CM | POA: Diagnosis not present

## 2021-12-31 DIAGNOSIS — D508 Other iron deficiency anemias: Secondary | ICD-10-CM

## 2021-12-31 DIAGNOSIS — N1832 Chronic kidney disease, stage 3b: Secondary | ICD-10-CM | POA: Diagnosis not present

## 2021-12-31 DIAGNOSIS — Z79899 Other long term (current) drug therapy: Secondary | ICD-10-CM | POA: Diagnosis not present

## 2021-12-31 DIAGNOSIS — Z86711 Personal history of pulmonary embolism: Secondary | ICD-10-CM | POA: Diagnosis not present

## 2021-12-31 MED ORDER — SODIUM CHLORIDE 0.9 % IV SOLN
Freq: Once | INTRAVENOUS | Status: AC
  Filled 2021-12-31: qty 250

## 2021-12-31 MED ORDER — IRON SUCROSE 20 MG/ML IV SOLN
200.0000 mg | Freq: Once | INTRAVENOUS | Status: AC
  Administered 2021-12-31: 200 mg via INTRAVENOUS
  Filled 2021-12-31: qty 10

## 2021-12-31 MED ORDER — SODIUM CHLORIDE 0.9 % IV SOLN: 200.0000 mg | Freq: Once | INTRAVENOUS | Status: DC

## 2021-12-31 NOTE — Patient Instructions (Signed)

## 2022-01-04 ENCOUNTER — Inpatient Hospital Stay: Payer: Medicare HMO

## 2022-01-05 ENCOUNTER — Inpatient Hospital Stay: Payer: Medicare HMO

## 2022-01-05 ENCOUNTER — Other Ambulatory Visit: Payer: Self-pay

## 2022-01-05 VITALS — BP 130/61 | HR 67

## 2022-01-05 DIAGNOSIS — D509 Iron deficiency anemia, unspecified: Secondary | ICD-10-CM | POA: Diagnosis not present

## 2022-01-05 DIAGNOSIS — D508 Other iron deficiency anemias: Secondary | ICD-10-CM

## 2022-01-05 DIAGNOSIS — E039 Hypothyroidism, unspecified: Secondary | ICD-10-CM | POA: Diagnosis not present

## 2022-01-05 DIAGNOSIS — Z86711 Personal history of pulmonary embolism: Secondary | ICD-10-CM | POA: Diagnosis not present

## 2022-01-05 DIAGNOSIS — N1832 Chronic kidney disease, stage 3b: Secondary | ICD-10-CM | POA: Diagnosis not present

## 2022-01-05 DIAGNOSIS — Z79899 Other long term (current) drug therapy: Secondary | ICD-10-CM | POA: Diagnosis not present

## 2022-01-05 DIAGNOSIS — D631 Anemia in chronic kidney disease: Secondary | ICD-10-CM | POA: Diagnosis not present

## 2022-01-05 DIAGNOSIS — R5383 Other fatigue: Secondary | ICD-10-CM | POA: Diagnosis not present

## 2022-01-05 DIAGNOSIS — Z7901 Long term (current) use of anticoagulants: Secondary | ICD-10-CM | POA: Diagnosis not present

## 2022-01-05 MED ORDER — IRON SUCROSE 20 MG/ML IV SOLN
200.0000 mg | Freq: Once | INTRAVENOUS | Status: AC
  Administered 2022-01-05: 200 mg via INTRAVENOUS
  Filled 2022-01-05: qty 10

## 2022-01-05 MED ORDER — SODIUM CHLORIDE 0.9 % IV SOLN
INTRAVENOUS | Status: DC
  Filled 2022-01-05: qty 250

## 2022-01-05 MED ORDER — SODIUM CHLORIDE 0.9 % IV SOLN: 200.0000 mg | Freq: Once | INTRAVENOUS | Status: DC

## 2022-01-05 NOTE — Patient Instructions (Signed)
[facility] [place]  Discharge Instructions: Thank you for choosing [facility] to provide your oncology and hematology care.  If you have a lab appointment with the [facility], please go directly to the [place] and check in at the registration area.  Wear comfortable clothing and clothing appropriate for easy access to any Portacath or PICC line.   We strive to give you quality time with your provider. You may need to reschedule your appointment if you arrive late (15 or more minutes).  Arriving late affects you and other patients whose appointments are after yours.  Also, if you miss three or more appointments without notifying the office, you may be dismissed from the clinic at the providers discretion.      For prescription refill requests, have your pharmacy contact our office and allow 72 hours for refills to be completed.    Today you received the following : Venofer    To help prevent nausea and vomiting after your treatment, we encourage you to take your nausea medication as directed.  BELOW ARE SYMPTOMS THAT SHOULD BE REPORTED IMMEDIATELY: *FEVER GREATER THAN 100.4 F (38 C) OR HIGHER *CHILLS OR SWEATING *NAUSEA AND VOMITING THAT IS NOT CONTROLLED WITH YOUR NAUSEA MEDICATION *UNUSUAL SHORTNESS OF BREATH *UNUSUAL BRUISING OR BLEEDING *URINARY PROBLEMS (pain or burning when urinating, or frequent urination) *BOWEL PROBLEMS (unusual diarrhea, constipation, pain near the anus) TENDERNESS IN MOUTH AND THROAT WITH OR WITHOUT PRESENCE OF ULCERS (sore throat, sores in mouth, or a toothache) UNUSUAL RASH, SWELLING OR PAIN  UNUSUAL VAGINAL DISCHARGE OR ITCHING   Items with * indicate a potential emergency and should be followed up as soon as possible or go to the Emergency Department if any problems should occur.  Please show the CHEMOTHERAPY ALERT CARD or IMMUNOTHERAPY ALERT CARD at check-in to the Emergency Department and triage  nurse.  Should you have questions after your visit or need to cancel or reschedule your appointment, please contact [facility] [place] AT [place]  [phone] and follow the prompts.  Office hours are 8:00 a.m. to 4:30 p.m. Monday - Friday. Please note that voicemails left after 4:00 p.m. may not be returned until the following business day.  We are closed weekends and major holidays. You have access to a nurse at all times for urgent questions. Please call the main number to the clinic [phone] and follow the prompts.  For any non-urgent questions, you may also contact your provider using MyChart. We now offer e-Visits for anyone 71 and older to request care online for non-urgent symptoms. For details visit [email or web address].   Also download the MyChart app! Go to the app store, search "MyChart", open the app, select Latoya Freeman, and log in with your MyChart username and password.  Due to Covid, a mask is required upon entering the hospital/clinic. If you do not have a mask, one will be given to you upon arrival. For doctor visits, patients may have 1 support person aged 1 or older with them. For treatment visits, patients cannot have anyone with them due to current Covid guidelines and our immunocompromised population.

## 2022-01-06 ENCOUNTER — Inpatient Hospital Stay: Payer: Medicare HMO

## 2022-01-08 ENCOUNTER — Inpatient Hospital Stay: Payer: Medicare HMO

## 2022-01-08 ENCOUNTER — Other Ambulatory Visit: Payer: Self-pay

## 2022-01-08 VITALS — BP 127/63 | HR 61 | Temp 97.7°F | Resp 16

## 2022-01-08 DIAGNOSIS — D508 Other iron deficiency anemias: Secondary | ICD-10-CM

## 2022-01-08 DIAGNOSIS — R5383 Other fatigue: Secondary | ICD-10-CM | POA: Diagnosis not present

## 2022-01-08 DIAGNOSIS — Z79899 Other long term (current) drug therapy: Secondary | ICD-10-CM | POA: Diagnosis not present

## 2022-01-08 DIAGNOSIS — D631 Anemia in chronic kidney disease: Secondary | ICD-10-CM | POA: Diagnosis not present

## 2022-01-08 DIAGNOSIS — Z86711 Personal history of pulmonary embolism: Secondary | ICD-10-CM | POA: Diagnosis not present

## 2022-01-08 DIAGNOSIS — D509 Iron deficiency anemia, unspecified: Secondary | ICD-10-CM | POA: Diagnosis not present

## 2022-01-08 DIAGNOSIS — N1832 Chronic kidney disease, stage 3b: Secondary | ICD-10-CM | POA: Diagnosis not present

## 2022-01-08 DIAGNOSIS — E039 Hypothyroidism, unspecified: Secondary | ICD-10-CM | POA: Diagnosis not present

## 2022-01-08 DIAGNOSIS — Z7901 Long term (current) use of anticoagulants: Secondary | ICD-10-CM | POA: Diagnosis not present

## 2022-01-08 MED ORDER — IRON SUCROSE 20 MG/ML IV SOLN
200.0000 mg | Freq: Once | INTRAVENOUS | Status: AC
  Administered 2022-01-08: 200 mg via INTRAVENOUS
  Filled 2022-01-08: qty 10

## 2022-01-08 MED ORDER — SODIUM CHLORIDE 0.9 % IV SOLN: 200.0000 mg | Freq: Once | INTRAVENOUS | Status: DC

## 2022-01-08 MED ORDER — SODIUM CHLORIDE 0.9 % IV SOLN
Freq: Once | INTRAVENOUS | Status: AC
  Filled 2022-01-08: qty 250

## 2022-01-08 NOTE — Patient Instructions (Signed)
[facility]   ?Discharge Instructions: ?Thank you for choosing [facility] to provide your oncology and hematology care.  ?If you have a lab appointment with the Cancer Center, please go directly to the Cancer Center and check in at the registration area. ?  ?We strive to give you quality time with your provider. You may need to reschedule your appointment if you arrive late (15 or more minutes).  Arriving late affects you and other patients whose appointments are after yours.  Also, if you miss three or more appointments without notifying the office, you may be dismissed from the clinic at the provider?s discretion.    ?  ?For prescription refill requests, have your pharmacy contact our office and allow 72 hours for refills to be completed.   ? ?Today you received the following: Venofer.    ?  ?BELOW ARE SYMPTOMS THAT SHOULD BE REPORTED IMMEDIATELY: ?*FEVER GREATER THAN 100.4 F (38 ?C) OR HIGHER ?*CHILLS OR SWEATING ?*NAUSEA AND VOMITING THAT IS NOT CONTROLLED WITH YOUR NAUSEA MEDICATION ?*UNUSUAL SHORTNESS OF BREATH ?*UNUSUAL BRUISING OR BLEEDING ?*URINARY PROBLEMS (pain or burning when urinating, or frequent urination) ?*BOWEL PROBLEMS (unusual diarrhea, constipation, pain near the anus) ?TENDERNESS IN MOUTH AND THROAT WITH OR WITHOUT PRESENCE OF ULCERS (sore throat, sores in mouth, or a toothache) ?UNUSUAL RASH, SWELLING OR PAIN  ?UNUSUAL VAGINAL DISCHARGE OR ITCHING  ? ?Items with * indicate a potential emergency and should be followed up as soon as possible or go to the Emergency Department if any problems should occur. ? ?Should you have questions after your visit or need to cancel or reschedule your appointment, please contact [facility]  Dept: [phone]  and follow the prompts.  Office hours are 8:00 a.m. to 4:30 p.m. Monday - Friday. Please note that voicemails left after 4:00 p.m. may not be returned until the following  business day.  We are closed weekends and major holidays. You have access to a nurse at all times for urgent questions. Please call the main number to the clinic Dept: [phone] and follow the prompts. ? ?For any non-urgent questions, you may also contact your provider using MyChart. We now offer e-Visits for anyone 18 and older to request care online for non-urgent symptoms. For details visit [email or web address]. ?  ?Also download the MyChart app! Go to the app store, search "MyChart", open the app, select , and log in with your MyChart username and password. ? ?Due to Covid, a mask is required upon entering the hospital/clinic. If you do not have a mask, one will be given to you upon arrival. For doctor visits, patients may have 1 support person aged 18 or older with them. For treatment visits, patients cannot have anyone with them due to current Covid guidelines and our immunocompromised population.  ?

## 2022-01-28 ENCOUNTER — Other Ambulatory Visit: Payer: Self-pay

## 2022-01-28 ENCOUNTER — Ambulatory Visit (INDEPENDENT_AMBULATORY_CARE_PROVIDER_SITE_OTHER): Payer: Medicare HMO | Admitting: Gastroenterology

## 2022-01-28 VITALS — BP 141/73 | HR 68 | Temp 98.7°F | Wt 186.0 lb

## 2022-01-28 DIAGNOSIS — R1114 Bilious vomiting: Secondary | ICD-10-CM | POA: Diagnosis not present

## 2022-01-28 NOTE — Progress Notes (Signed)
? ? ?Primary Care Physician: [name], MD ? ?Primary Gastroenterologist:  Dr. Lucilla Lame ? ?Chief Complaint  ?Patient presents with  ? Gastroesophageal Reflux  ?  Transfer from Dr Bonna Gains  ? ? ?HPI: Latoya Freeman is a 81 y.o. female here for follow-up of chronic nausea and vomiting.  The patient states that she started to have worsening of her symptoms at the having meningitis. The patient had pH studies with impedance that did not show any correlation with her symptoms.  The patient also had good acid control on a PPI of both the esophagus and the stomach.  The patient appeared study did not show any sign of reflux.  The patient also reports that now she is under less stress in the summer is coming she has been doing much better this week.  Her symptoms historically have been very intermittent but deathly got worse after her Meningitis. The patient also reports that when she has these symptoms that cause her to vomit she then has the very bad taste in her mouth with burning upper esophagus. ? ?Past Medical History:  ?Diagnosis Date  ? Aneurysm ([place]) 1980  ? Arthritis   ? Cancer [facility])   ? GERD (gastroesophageal reflux disease)   ? Hypertension 1980  ? Hypothyroidism 1999  ? Pneumonia   ? Pulmonary embolism ([place])   ? Shingles   ? Wears dentures   ? full upper and lower  ? ? ?Current Outpatient Medications  ?Medication Sig Dispense Refill  ? acetaminophen (TYLENOL) 325 MG tablet Take 2 tablets (650 mg total) by mouth every 6 (six) hours as needed for mild pain, moderate pain, fever or headache (or Fever >/= 101).    ? amLODipine (NORVASC) 5 MG tablet Take 5 mg by mouth daily.    ? bisacodyl (DULCOLAX) 5 MG EC tablet Take 1 tablet (5 mg total) by mouth daily as needed for moderate constipation. 30 tablet 0  ? cetirizine (ZYRTEC) 10 MG tablet Take 10 mg by mouth daily as needed for allergies.    ? clopidogrel (PLAVIX) 75 MG tablet Take 75 mg by mouth daily.    ? ELIQUIS 5 MG TABS tablet TAKE 1 TABLET TWICE  DAILY 180 tablet 3  ? HYDROcodone-acetaminophen (NORCO/VICODIN) 5-325 MG tablet Take 1 tablet by mouth every 6 (six) hours as needed for moderate pain or severe pain. 30 tablet 0  ? levothyroxine (SYNTHROID, LEVOTHROID) 75 MCG tablet Take 75 mcg by mouth daily.    ? losartan (COZAAR) 50 MG tablet Take 50 mg by mouth daily.     ? potassium chloride SA (KLOR-CON) 20 MEQ tablet Take 1 tablet (20 mEq total) by mouth 2 (two) times daily. 14 tablet 0  ? senna (SENOKOT) 8.6 MG TABS tablet Take 1 tablet (8.6 mg total) by mouth 2 (two) times daily. 120 tablet 0  ? solifenacin (VESICARE) 5 MG tablet Take 5 mg by mouth daily.    ? sucralfate (CARAFATE) 1 GM/10ML suspension Take 10 mLs (1 g total) by mouth 4 (four) times daily -  with meals and at bedtime. 828 mL 0  ? triamterene-hydrochlorothiazide (MAXZIDE-25) 37.5-25 MG tablet Take 1 tablet by mouth daily.    ? dexlansoprazole (DEXILANT) 60 MG capsule Take 1 capsule (60 mg total) by mouth daily. 30 capsule 0  ? famotidine (PEPCID) 20 MG tablet Take 1 tablet (20 mg total) by mouth at bedtime. 30 tablet 0  ? ?No current facility-administered medications for this visit.  ? ? ?Allergies as  of 01/28/2022 - Review Complete 01/28/2022  ?Allergen Reaction Noted  ? Bee venom Swelling 01/31/2018  ? Shellfish allergy Swelling 01/31/2018  ? Tamoxifen Hives 07/20/2018  ? Penicillins Hives, Rash, and Other (See Comments) 12/21/2012  ? ? ?ROS: ? ?General: Negative for anorexia, weight loss, fever, chills, fatigue, weakness. ?ENT: Negative for hoarseness, difficulty swallowing , nasal congestion. ?CV: Negative for chest pain, angina, palpitations, dyspnea on exertion, peripheral edema.  ?Respiratory: Negative for dyspnea at rest, dyspnea on exertion, cough, sputum, wheezing.  ?GI: See history of present illness. ?GU:  Negative for dysuria, hematuria, urinary incontinence, urinary frequency, nocturnal urination.  ?Endo: Negative for unusual weight change.  ?  ?Physical Examination: ? ? BP  (!) 141/73   Pulse 68   Temp 98.7 ?F (37.1 ?C) (Oral)   Wt 186 lb (84.4 kg)   BMI 35.14 kg/m?  ? ?General: Well-nourished, well-developed in no acute distress.  ?Eyes: No icterus. Conjunctivae pink. ?Extremities: No lower extremity edema. No clubbing or deformities. ?Neuro: Alert and oriented x 3.  Grossly intact. ?Skin: Warm and dry, no jaundice.   ?Psych: Alert and cooperative, normal mood and affect. ? ?Labs:  ?  ?Imaging Studies: ?No results found. ? ?Assessment and Plan:  ? ?Latoya Freeman is a 81 y.o. y/o female who comes in today for follow-up after being seen by one of my partners who is no long with practice. The patient states that she has been doing well recently and has been explained the results of her impedance and pH study and that it is unlikely that her intermittent nausea, which has been worse since her bout of meningitis and worse with stress, Is unlikely from a GI source and is more likely a central cause for her symptoms.  The patient has been told to continue doing what she is doing since she is feeling better at the present time and she has been told to follow up with me as needed.  The patient has been explained the plan and agrees with it. ? ? ? ? ?Lucilla Lame, MD. Mar[facility] Regal ? ? ? Note: This dictation was prepared with Dragon dictation along with smaller phrase technology. Any transcriptional errors that result from this process are unintentional.  ?

## 2022-02-16 DIAGNOSIS — R42 Dizziness and giddiness: Secondary | ICD-10-CM | POA: Diagnosis not present

## 2022-02-16 DIAGNOSIS — K219 Gastro-esophageal reflux disease without esophagitis: Secondary | ICD-10-CM | POA: Diagnosis not present

## 2022-02-16 DIAGNOSIS — I10 Essential (primary) hypertension: Secondary | ICD-10-CM | POA: Diagnosis not present

## 2022-02-16 DIAGNOSIS — R112 Nausea with vomiting, unspecified: Secondary | ICD-10-CM | POA: Diagnosis not present

## 2022-03-01 ENCOUNTER — Inpatient Hospital Stay: Payer: Medicare HMO

## 2022-03-01 ENCOUNTER — Inpatient Hospital Stay: Payer: Medicare HMO | Admitting: Oncology

## 2022-03-01 ENCOUNTER — Inpatient Hospital Stay: Payer: Medicare HMO | Attending: Oncology

## 2022-03-02 ENCOUNTER — Telehealth: Payer: Self-pay | Admitting: *Deleted

## 2022-03-02 NOTE — Telephone Encounter (Signed)
Patient left message that she is returning call to schedule appointments ?

## 2022-03-09 DIAGNOSIS — M9901 Segmental and somatic dysfunction of cervical region: Secondary | ICD-10-CM | POA: Diagnosis not present

## 2022-03-09 DIAGNOSIS — M9903 Segmental and somatic dysfunction of lumbar region: Secondary | ICD-10-CM | POA: Diagnosis not present

## 2022-03-09 DIAGNOSIS — M9902 Segmental and somatic dysfunction of thoracic region: Secondary | ICD-10-CM | POA: Diagnosis not present

## 2022-03-09 DIAGNOSIS — M62830 Muscle spasm of back: Secondary | ICD-10-CM | POA: Diagnosis not present

## 2022-03-11 DIAGNOSIS — M9903 Segmental and somatic dysfunction of lumbar region: Secondary | ICD-10-CM | POA: Diagnosis not present

## 2022-03-11 DIAGNOSIS — M9901 Segmental and somatic dysfunction of cervical region: Secondary | ICD-10-CM | POA: Diagnosis not present

## 2022-03-11 DIAGNOSIS — M62830 Muscle spasm of back: Secondary | ICD-10-CM | POA: Diagnosis not present

## 2022-03-11 DIAGNOSIS — M9902 Segmental and somatic dysfunction of thoracic region: Secondary | ICD-10-CM | POA: Diagnosis not present

## 2022-03-15 DIAGNOSIS — M9902 Segmental and somatic dysfunction of thoracic region: Secondary | ICD-10-CM | POA: Diagnosis not present

## 2022-03-15 DIAGNOSIS — M62830 Muscle spasm of back: Secondary | ICD-10-CM | POA: Diagnosis not present

## 2022-03-15 DIAGNOSIS — M9901 Segmental and somatic dysfunction of cervical region: Secondary | ICD-10-CM | POA: Diagnosis not present

## 2022-03-15 DIAGNOSIS — M9903 Segmental and somatic dysfunction of lumbar region: Secondary | ICD-10-CM | POA: Diagnosis not present

## 2022-03-18 DIAGNOSIS — M9903 Segmental and somatic dysfunction of lumbar region: Secondary | ICD-10-CM | POA: Diagnosis not present

## 2022-03-18 DIAGNOSIS — M62830 Muscle spasm of back: Secondary | ICD-10-CM | POA: Diagnosis not present

## 2022-03-18 DIAGNOSIS — M9902 Segmental and somatic dysfunction of thoracic region: Secondary | ICD-10-CM | POA: Diagnosis not present

## 2022-03-18 DIAGNOSIS — M9901 Segmental and somatic dysfunction of cervical region: Secondary | ICD-10-CM | POA: Diagnosis not present

## 2022-03-29 DIAGNOSIS — M62830 Muscle spasm of back: Secondary | ICD-10-CM | POA: Diagnosis not present

## 2022-03-29 DIAGNOSIS — M9902 Segmental and somatic dysfunction of thoracic region: Secondary | ICD-10-CM | POA: Diagnosis not present

## 2022-03-29 DIAGNOSIS — M9903 Segmental and somatic dysfunction of lumbar region: Secondary | ICD-10-CM | POA: Diagnosis not present

## 2022-03-29 DIAGNOSIS — M9901 Segmental and somatic dysfunction of cervical region: Secondary | ICD-10-CM | POA: Diagnosis not present

## 2022-03-30 DIAGNOSIS — H9203 Otalgia, bilateral: Secondary | ICD-10-CM | POA: Diagnosis not present

## 2022-03-30 DIAGNOSIS — R110 Nausea: Secondary | ICD-10-CM | POA: Diagnosis not present

## 2022-03-30 DIAGNOSIS — R42 Dizziness and giddiness: Secondary | ICD-10-CM | POA: Diagnosis not present

## 2022-03-31 ENCOUNTER — Inpatient Hospital Stay: Payer: Medicare HMO

## 2022-03-31 ENCOUNTER — Inpatient Hospital Stay (HOSPITAL_BASED_OUTPATIENT_CLINIC_OR_DEPARTMENT_OTHER): Payer: Medicare HMO | Admitting: Oncology

## 2022-03-31 ENCOUNTER — Encounter: Payer: Self-pay | Admitting: Oncology

## 2022-03-31 ENCOUNTER — Inpatient Hospital Stay: Payer: Medicare HMO | Attending: Oncology

## 2022-03-31 VITALS — BP 119/64 | HR 66 | Temp 97.6°F | Resp 18 | Wt 184.9 lb

## 2022-03-31 DIAGNOSIS — Z7901 Long term (current) use of anticoagulants: Secondary | ICD-10-CM | POA: Diagnosis not present

## 2022-03-31 DIAGNOSIS — N1832 Chronic kidney disease, stage 3b: Secondary | ICD-10-CM | POA: Insufficient documentation

## 2022-03-31 DIAGNOSIS — Z79899 Other long term (current) drug therapy: Secondary | ICD-10-CM | POA: Diagnosis not present

## 2022-03-31 DIAGNOSIS — Z86711 Personal history of pulmonary embolism: Secondary | ICD-10-CM | POA: Diagnosis not present

## 2022-03-31 DIAGNOSIS — E039 Hypothyroidism, unspecified: Secondary | ICD-10-CM | POA: Diagnosis not present

## 2022-03-31 DIAGNOSIS — Z862 Personal history of diseases of the blood and blood-forming organs and certain disorders involving the immune mechanism: Secondary | ICD-10-CM | POA: Diagnosis not present

## 2022-03-31 DIAGNOSIS — D631 Anemia in chronic kidney disease: Secondary | ICD-10-CM | POA: Insufficient documentation

## 2022-03-31 DIAGNOSIS — D509 Iron deficiency anemia, unspecified: Secondary | ICD-10-CM

## 2022-03-31 LAB — CBC WITH DIFFERENTIAL/PLATELET
Abs Immature Granulocytes: 0.01 10*3/uL (ref 0.00–0.07)
Basophils Absolute: 0 10*3/uL (ref 0.0–0.1)
Basophils Relative: 1 %
Eosinophils Absolute: 0.1 10*3/uL (ref 0.0–0.5)
Eosinophils Relative: 1 %
HCT: 40.2 % (ref 36.0–46.0)
Hemoglobin: 13.7 g/dL (ref 12.0–15.0)
Immature Granulocytes: 0 %
Lymphocytes Relative: 31 %
Lymphs Abs: 2 10*3/uL (ref 0.7–4.0)
MCH: 29.7 pg (ref 26.0–34.0)
MCHC: 34.1 g/dL (ref 30.0–36.0)
MCV: 87 fL (ref 80.0–100.0)
Monocytes Absolute: 0.6 10*3/uL (ref 0.1–1.0)
Monocytes Relative: 10 %
Neutro Abs: 3.8 10*3/uL (ref 1.7–7.7)
Neutrophils Relative %: 57 %
Platelets: 284 10*3/uL (ref 150–400)
RBC: 4.62 MIL/uL (ref 3.87–5.11)
RDW: 14 % (ref 11.5–15.5)
WBC: 6.5 10*3/uL (ref 4.0–10.5)
nRBC: 0 % (ref 0.0–0.2)

## 2022-03-31 LAB — COMPREHENSIVE METABOLIC PANEL
ALT: 15 U/L (ref 0–44)
AST: 19 U/L (ref 15–41)
Albumin: 3.9 g/dL (ref 3.5–5.0)
Alkaline Phosphatase: 60 U/L (ref 38–126)
Anion gap: 7 (ref 5–15)
BUN: 14 mg/dL (ref 8–23)
CO2: 27 mmol/L (ref 22–32)
Calcium: 10 mg/dL (ref 8.9–10.3)
Chloride: 101 mmol/L (ref 98–111)
Creatinine, Ser: 1.53 mg/dL — ABNORMAL HIGH (ref 0.44–1.00)
GFR, Estimated: 34 mL/min — ABNORMAL LOW (ref >60)
Glucose, Bld: 105 mg/dL — ABNORMAL HIGH (ref 70–99)
Potassium: 3.4 mmol/L — ABNORMAL LOW (ref 3.5–5.1)
Sodium: 135 mmol/L (ref 135–145)
Total Bilirubin: 0.7 mg/dL (ref 0.3–1.2)
Total Protein: 7.4 g/dL (ref 6.5–8.1)

## 2022-03-31 LAB — FERRITIN: Ferritin: 68 ng/mL (ref 11–307)

## 2022-03-31 LAB — IRON AND TIBC
Iron: 97 ug/dL (ref 28–170)
Saturation Ratios: 30 % (ref 10.4–31.8)
TIBC: 328 ug/dL (ref 250–450)
UIBC: 231 ug/dL

## 2022-03-31 LAB — VITAMIN B12: Vitamin B-12: 303 pg/mL (ref 180–914)

## 2022-03-31 NOTE — Progress Notes (Signed)
Pt states that she has been dealing with extreme heartburn in the morning to the point that they make her sick. Was seen by ENT yesterday and was informed that VESICARE might be causing the extreme heartburn so pt has quit taking it since yesterday,  ? ?

## 2022-04-04 ENCOUNTER — Encounter: Payer: Self-pay | Admitting: Oncology

## 2022-04-04 NOTE — Progress Notes (Signed)
? ? ? ?Hematology/Oncology Consult note ?[place]  ?[phone] [phone] ? ?Patient Care Team: ?[name], MD as PCP - General (Family Medicine) ?Sindy Guadeloupe, MD as Consulting Physician (Oncology)  ? ?Name of the patient: Latoya Freeman  ?[id number]  ?1941/07/23  ? ?Date of visit: 04/04/22 ? ?Diagnosis- 1.  History of unprovoked PE on Eliquis ?2.  History of iron deficiency anemia ? ?Chief complaint/ Reason for visit-routine follow-up of PE and iron deficiency anemia ? ?Heme/Onc history: Patient is a 81 year old female who was seen by me in June 2020.  She was found to have unprovoked PE in December 2019 and has been on Eliquis since then.  She has now been referred to me for anemia.  Most recent CBC from 07/20/2020 showed H&H of 9.7/29 with an MCV of 83 ferritin levels were low at 12 and iron saturation low at 14%.  Patient has been following up with Dr. 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  She was evaluated by vascular surgery at [facility] for possible intervention of this ligament and subsequently underwent surgery with resolution of symptoms ? ?Interval history-patient is doing well overall.  She does have some mild baseline nausea for which she follows up with GI.  Patient also had back surgery which was complicated by meningitis and required prolonged recovery.  Denies any blood loss in her stool or urine ? ?ECOG PS- 1 ?Pain scale- 0 ? ? ?Review of systems- Review of Systems  ?Constitutional:  Negative for chills, fever, malaise/fatigue and weight loss.  ?HENT:  Negative for congestion, ear discharge and nosebleeds.   ?Eyes:  Negative for blurred vision.  ?Respiratory:  Negative for cough, hemoptysis,  sputum production, shortness of breath and wheezing.   ?Cardiovascular:  Negative for chest pain, palpitations, orthopnea and claudication.  ?Gastrointestinal:  Positive for nausea. Negative for abdominal pain, blood in stool, constipation, diarrhea, heartburn, melena and vomiting.  ?Genitourinary:  Negative for dysuria, flank pain, frequency, hematuria and urgency.  ?Musculoskeletal:  Negative for back pain, joint pain and myalgias.  ?Skin:  Negative for rash.  ?Neurological:  Negative for dizziness, tingling, focal weakness, seizures, weakness and headaches.  ?Endo/Heme/Allergies:  Does not bruise/bleed easily.  ?Psychiatric/Behavioral:  Negative for depression and suicidal ideas. The patient does not have insomnia.    ? ? ?Allergies  ?Allergen Reactions  ? Bee Venom Swelling  ? Shellfish Allergy Swelling  ? Tamoxifen Hives  ? Penicillins Hives, Rash and Other (See Comments)  ?  Has patient had a PCN reaction causing immediate rash, facial/tongue/throat swelling, SOB or lightheadedness with hypotension: Unknown ?Has patient had a PCN reaction causing severe rash involving mucus membranes or skin necrosis: Unknown ?Has patient had a PCN reaction that required hospitalization: Unknown ?Has patient had a PCN reaction occurring within the last 10 years: No ?If all of the above answers are "NO", then may proceed with Cephalosporin use. ?  ? ? ? ?Past Medical History:  ?Diagnosis Date  ? Aneurysm ([place]) 1980  ? Arthritis   ? Cancer [facility])   ? GERD (gastroesophageal reflux disease)   ? Hypertension 1980  ? Hypothyroidism 1999  ? Pneumonia   ? Pulmonary embolism ([place])   ? Shingles   ? Wears dentures   ? full upper and  lower  ? ? ? ?Past Surgical History:  ?Procedure Laterality Date  ? BRAIN SURGERY    ? aneurysm  ? BREAST CYST ASPIRATION Left 2005  ? BREAST EXCISIONAL BIOPSY Left 2013  ? atypical ductal hyperplasia  ? BREAST SURGERY  2013  ? LF Breast Wide EXC   ? CHOLECYSTECTOMY  2004  ? COLONOSCOPY    ? COLONOSCOPY WITH  PROPOFOL N/A 01/31/2018  ? Procedure: COLONOSCOPY WITH PROPOFOL;  Surgeon: Lollie Sails, MD;  Location: [facility] ENDOSCOPY;  Service: Endoscopy;  Laterality: N/A;  ? COLONOSCOPY WITH PROPOFOL N/A 08/06/2020  ? Procedure: COLONOSCOPY WITH PROPOFOL;  Surgeon: Virgel Manifold, MD;  Location: [facility] ENDOSCOPY;  Service: Endoscopy;  Laterality: N/A;  ? DILATION AND CURETTAGE OF UTERUS N/A 05/03/2019  ? Procedure: DILATATION AND CURETTAGE;  Surgeon: Gae Dry, MD;  Location: [facility] ORS;  Service: Gynecology;  Laterality: N/A;  ? ESOPHAGOGASTRODUODENOSCOPY N/A 01/31/2018  ? Procedure: ESOPHAGOGASTRODUODENOSCOPY (EGD);  Surgeon: Lollie Sails, MD;  Location: [facility] ENDOSCOPY;  Service: Endoscopy;  Laterality: N/A;  ? ESOPHAGOGASTRODUODENOSCOPY (EGD) WITH PROPOFOL N/A 08/06/2020  ? Procedure: ESOPHAGOGASTRODUODENOSCOPY (EGD) WITH PROPOFOL;  Surgeon: Virgel Manifold, MD;  Location: [facility] ENDOSCOPY;  Service: Endoscopy;  Laterality: N/A;  ? LUMBAR LAMINECTOMY/DECOMPRESSION MICRODISCECTOMY N/A 07/01/2021  ? Procedure: LEFT L3-4 MICRODSICECTOMY, L4-S1 DECOMPRESSION;  Surgeon: Meade Maw, MD;  Location: [facility] ORS;  Service: Neurosurgery;  Laterality: N/A;  ? LUMBAR PUNCTURE    ? SHOULDER ARTHROSCOPY Left 07/25/2018  ? 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 VISCERAL ANGIOGRAPHY N/A 08/14/2020  ? Procedure: VISCERAL ANGIOGRAPHY;  Surgeon: Algernon Huxley, MD;  Location: [facility];  Service: Cardiovascular;  Laterality: N/A;  ? VISCERAL ANGIOGRAPHY N/A 09/03/2021  ? Procedure: VISCERAL ANGIOGRAPHY;  Surgeon: Algernon Huxley, MD;  Location: [facility];  Service: Cardiovascular;  Laterality: N/A;  ? ? ?Social History   ? ?Socioeconomic History  ? Marital status: Divorced  ?  Spouse name: Not on file  ? Number of children: Not on file  ? Years of education: Not on file  ? Highest education level: Not on file  ?Occupational History  ? Not on file  ?Tobacco Use  ? Smoking status: Former  ?  Packs/day: 1.50  ?  Years: 20.00  ?  Pack years: 30.00  ?  Types: Cigarettes  ?  Quit date: 35  ?  Years since quitting: 33.3  ? Smokeless tobacco: Never  ?Vaping Use  ? Vaping Use: Never used  ?Substance and Sexual Activity  ? Alcohol use: No  ? Drug use: No  ? Sexual activity: Not on file  ?Other Topics Concern  ? Not on file  ?Social History Narrative  ? Lives alone  ? ?Social Determinants of Health  ? ?Financial Resource Strain: Not on file  ?Food Insecurity: Not on file  ?Transportation Needs: Not on file  ?Physical Activity: Not on file  ?Stress: Not on file  ?Social Connections: Not on file  ?Intimate Partner Violence: Not on file  ? ? ?Family History  ?Problem Relation Age of Onset  ? Breast cancer Neg Hx   ? ? ? ?Current Outpatient Medications:  ?  acetaminophen (TYLENOL) 325 MG tablet, Take 2 tablets (650 mg total) by  mouth every 6 (six) hours as needed for mild pain, moderate pain, fever or headache (or Fever >/= 101)., Disp: , Rfl:  ?  amLODipine (NORVASC) 5 MG tablet, Take 5 mg by mouth daily., Disp: , Rfl:  ?  cetirizine (ZYRTEC) 10 MG tablet, Take 10 mg by mouth daily as needed for allergies., Disp: , Rfl:  ?  ELIQUIS 5 MG TABS tablet, TAKE 1 TABLET TWICE DAILY, Disp: 180 tablet, Rfl: 3 ?  levothyroxine (SYNTHROID, LEVOTHROID) 75 MCG tablet, Take 75 mcg by mouth daily., Disp: , Rfl:  ?  losartan (COZAAR) 50 MG tablet, Take 50 mg by mouth daily. , Disp: , Rfl:  ?  omeprazole (PRILOSEC) 20 MG capsule, Take by mouth., Disp: , Rfl:  ?  solifenacin (VESICARE) 5 MG tablet, Take 5 mg by mouth daily., Disp: , Rfl:  ?  sucralfate (CARAFATE) 1 GM/10ML suspension, Take 10 mLs (1 g total) by mouth 4 (four) times daily -  with meals and at  bedtime., Disp: 828 mL, Rfl: 0 ?  triamterene-hydrochlorothiazide (MAXZIDE-25) 37.5-25 MG tablet, Take 1 tablet by mouth daily., Disp: , Rfl:  ?  bisacodyl (DULCOLAX) 5 MG EC tablet, Take 1 tablet (5 mg tot

## 2022-04-05 DIAGNOSIS — M9902 Segmental and somatic dysfunction of thoracic region: Secondary | ICD-10-CM | POA: Diagnosis not present

## 2022-04-05 DIAGNOSIS — M9901 Segmental and somatic dysfunction of cervical region: Secondary | ICD-10-CM | POA: Diagnosis not present

## 2022-04-05 DIAGNOSIS — M62830 Muscle spasm of back: Secondary | ICD-10-CM | POA: Diagnosis not present

## 2022-04-05 DIAGNOSIS — M9903 Segmental and somatic dysfunction of lumbar region: Secondary | ICD-10-CM | POA: Diagnosis not present

## 2022-04-12 DIAGNOSIS — M9901 Segmental and somatic dysfunction of cervical region: Secondary | ICD-10-CM | POA: Diagnosis not present

## 2022-04-12 DIAGNOSIS — M62830 Muscle spasm of back: Secondary | ICD-10-CM | POA: Diagnosis not present

## 2022-04-12 DIAGNOSIS — M9902 Segmental and somatic dysfunction of thoracic region: Secondary | ICD-10-CM | POA: Diagnosis not present

## 2022-04-12 DIAGNOSIS — M9903 Segmental and somatic dysfunction of lumbar region: Secondary | ICD-10-CM | POA: Diagnosis not present

## 2022-04-20 DIAGNOSIS — M9902 Segmental and somatic dysfunction of thoracic region: Secondary | ICD-10-CM | POA: Diagnosis not present

## 2022-04-20 DIAGNOSIS — M9901 Segmental and somatic dysfunction of cervical region: Secondary | ICD-10-CM | POA: Diagnosis not present

## 2022-04-20 DIAGNOSIS — M9903 Segmental and somatic dysfunction of lumbar region: Secondary | ICD-10-CM | POA: Diagnosis not present

## 2022-04-20 DIAGNOSIS — M62830 Muscle spasm of back: Secondary | ICD-10-CM | POA: Diagnosis not present

## 2022-04-26 DIAGNOSIS — M9901 Segmental and somatic dysfunction of cervical region: Secondary | ICD-10-CM | POA: Diagnosis not present

## 2022-04-26 DIAGNOSIS — M9902 Segmental and somatic dysfunction of thoracic region: Secondary | ICD-10-CM | POA: Diagnosis not present

## 2022-04-26 DIAGNOSIS — M62830 Muscle spasm of back: Secondary | ICD-10-CM | POA: Diagnosis not present

## 2022-04-26 DIAGNOSIS — M9903 Segmental and somatic dysfunction of lumbar region: Secondary | ICD-10-CM | POA: Diagnosis not present

## 2022-05-03 DIAGNOSIS — M9901 Segmental and somatic dysfunction of cervical region: Secondary | ICD-10-CM | POA: Diagnosis not present

## 2022-05-03 DIAGNOSIS — M62830 Muscle spasm of back: Secondary | ICD-10-CM | POA: Diagnosis not present

## 2022-05-03 DIAGNOSIS — M9903 Segmental and somatic dysfunction of lumbar region: Secondary | ICD-10-CM | POA: Diagnosis not present

## 2022-05-03 DIAGNOSIS — M9902 Segmental and somatic dysfunction of thoracic region: Secondary | ICD-10-CM | POA: Diagnosis not present

## 2022-05-17 DIAGNOSIS — M9902 Segmental and somatic dysfunction of thoracic region: Secondary | ICD-10-CM | POA: Diagnosis not present

## 2022-05-17 DIAGNOSIS — M9901 Segmental and somatic dysfunction of cervical region: Secondary | ICD-10-CM | POA: Diagnosis not present

## 2022-05-17 DIAGNOSIS — M9903 Segmental and somatic dysfunction of lumbar region: Secondary | ICD-10-CM | POA: Diagnosis not present

## 2022-05-17 DIAGNOSIS — M62830 Muscle spasm of back: Secondary | ICD-10-CM | POA: Diagnosis not present

## 2022-05-31 DIAGNOSIS — M9902 Segmental and somatic dysfunction of thoracic region: Secondary | ICD-10-CM | POA: Diagnosis not present

## 2022-05-31 DIAGNOSIS — M9903 Segmental and somatic dysfunction of lumbar region: Secondary | ICD-10-CM | POA: Diagnosis not present

## 2022-05-31 DIAGNOSIS — M9901 Segmental and somatic dysfunction of cervical region: Secondary | ICD-10-CM | POA: Diagnosis not present

## 2022-05-31 DIAGNOSIS — M62830 Muscle spasm of back: Secondary | ICD-10-CM | POA: Diagnosis not present

## 2022-06-14 DIAGNOSIS — M9903 Segmental and somatic dysfunction of lumbar region: Secondary | ICD-10-CM | POA: Diagnosis not present

## 2022-06-14 DIAGNOSIS — M9902 Segmental and somatic dysfunction of thoracic region: Secondary | ICD-10-CM | POA: Diagnosis not present

## 2022-06-14 DIAGNOSIS — M62830 Muscle spasm of back: Secondary | ICD-10-CM | POA: Diagnosis not present

## 2022-06-14 DIAGNOSIS — M9901 Segmental and somatic dysfunction of cervical region: Secondary | ICD-10-CM | POA: Diagnosis not present

## 2022-07-01 ENCOUNTER — Inpatient Hospital Stay: Payer: Medicare HMO

## 2022-07-08 ENCOUNTER — Inpatient Hospital Stay: Payer: Medicare HMO | Attending: Oncology

## 2022-07-08 DIAGNOSIS — Z86711 Personal history of pulmonary embolism: Secondary | ICD-10-CM | POA: Insufficient documentation

## 2022-07-08 DIAGNOSIS — Z7901 Long term (current) use of anticoagulants: Secondary | ICD-10-CM | POA: Diagnosis not present

## 2022-07-08 DIAGNOSIS — Z87891 Personal history of nicotine dependence: Secondary | ICD-10-CM | POA: Insufficient documentation

## 2022-07-08 DIAGNOSIS — D509 Iron deficiency anemia, unspecified: Secondary | ICD-10-CM | POA: Diagnosis not present

## 2022-07-08 LAB — CBC WITH DIFFERENTIAL/PLATELET
Abs Immature Granulocytes: 0.02 10*3/uL (ref 0.00–0.07)
Basophils Absolute: 0 10*3/uL (ref 0.0–0.1)
Basophils Relative: 0 %
Eosinophils Absolute: 0 10*3/uL (ref 0.0–0.5)
Eosinophils Relative: 0 %
HCT: 38.2 % (ref 36.0–46.0)
Hemoglobin: 13.3 g/dL (ref 12.0–15.0)
Immature Granulocytes: 0 %
Lymphocytes Relative: 33 %
Lymphs Abs: 2.4 10*3/uL (ref 0.7–4.0)
MCH: 30.4 pg (ref 26.0–34.0)
MCHC: 34.8 g/dL (ref 30.0–36.0)
MCV: 87.4 fL (ref 80.0–100.0)
Monocytes Absolute: 0.7 10*3/uL (ref 0.1–1.0)
Monocytes Relative: 9 %
Neutro Abs: 4.3 10*3/uL (ref 1.7–7.7)
Neutrophils Relative %: 58 %
Platelets: 274 10*3/uL (ref 150–400)
RBC: 4.37 MIL/uL (ref 3.87–5.11)
RDW: 12.3 % (ref 11.5–15.5)
WBC: 7.5 10*3/uL (ref 4.0–10.5)
nRBC: 0 % (ref 0.0–0.2)

## 2022-07-08 LAB — IRON AND TIBC
Iron: 113 ug/dL (ref 28–170)
Saturation Ratios: 35 % — ABNORMAL HIGH (ref 10.4–31.8)
TIBC: 319 ug/dL (ref 250–450)
UIBC: 206 ug/dL

## 2022-07-08 LAB — FERRITIN: Ferritin: 68 ng/mL (ref 11–307)

## 2022-07-28 DIAGNOSIS — M1612 Unilateral primary osteoarthritis, left hip: Secondary | ICD-10-CM | POA: Diagnosis not present

## 2022-07-28 DIAGNOSIS — M25552 Pain in left hip: Secondary | ICD-10-CM | POA: Diagnosis not present

## 2022-07-28 DIAGNOSIS — M25561 Pain in right knee: Secondary | ICD-10-CM | POA: Diagnosis not present

## 2022-07-28 DIAGNOSIS — M25562 Pain in left knee: Secondary | ICD-10-CM | POA: Diagnosis not present

## 2022-07-28 DIAGNOSIS — M1712 Unilateral primary osteoarthritis, left knee: Secondary | ICD-10-CM | POA: Diagnosis not present

## 2022-07-28 DIAGNOSIS — M76892 Other specified enthesopathies of left lower limb, excluding foot: Secondary | ICD-10-CM | POA: Diagnosis not present

## 2022-07-28 DIAGNOSIS — M7062 Trochanteric bursitis, left hip: Secondary | ICD-10-CM | POA: Diagnosis not present

## 2022-07-28 DIAGNOSIS — M1711 Unilateral primary osteoarthritis, right knee: Secondary | ICD-10-CM | POA: Diagnosis not present

## 2022-08-10 ENCOUNTER — Other Ambulatory Visit: Payer: Self-pay | Admitting: Family Medicine

## 2022-08-10 DIAGNOSIS — M25562 Pain in left knee: Secondary | ICD-10-CM | POA: Diagnosis not present

## 2022-08-10 DIAGNOSIS — M1711 Unilateral primary osteoarthritis, right knee: Secondary | ICD-10-CM | POA: Diagnosis not present

## 2022-08-10 DIAGNOSIS — M1712 Unilateral primary osteoarthritis, left knee: Secondary | ICD-10-CM | POA: Diagnosis not present

## 2022-08-10 DIAGNOSIS — M170 Bilateral primary osteoarthritis of knee: Secondary | ICD-10-CM | POA: Diagnosis not present

## 2022-08-10 DIAGNOSIS — G8929 Other chronic pain: Secondary | ICD-10-CM | POA: Diagnosis not present

## 2022-08-10 DIAGNOSIS — Z1231 Encounter for screening mammogram for malignant neoplasm of breast: Secondary | ICD-10-CM

## 2022-08-10 DIAGNOSIS — M25561 Pain in right knee: Secondary | ICD-10-CM | POA: Diagnosis not present

## 2022-08-17 DIAGNOSIS — M170 Bilateral primary osteoarthritis of knee: Secondary | ICD-10-CM | POA: Diagnosis not present

## 2022-08-17 DIAGNOSIS — M25561 Pain in right knee: Secondary | ICD-10-CM | POA: Diagnosis not present

## 2022-08-17 DIAGNOSIS — M1712 Unilateral primary osteoarthritis, left knee: Secondary | ICD-10-CM | POA: Diagnosis not present

## 2022-08-17 DIAGNOSIS — M25562 Pain in left knee: Secondary | ICD-10-CM | POA: Diagnosis not present

## 2022-08-17 DIAGNOSIS — M1711 Unilateral primary osteoarthritis, right knee: Secondary | ICD-10-CM | POA: Diagnosis not present

## 2022-08-17 DIAGNOSIS — G8929 Other chronic pain: Secondary | ICD-10-CM | POA: Diagnosis not present

## 2022-08-23 DIAGNOSIS — Z0000 Encounter for general adult medical examination without abnormal findings: Secondary | ICD-10-CM | POA: Diagnosis not present

## 2022-08-23 DIAGNOSIS — G4700 Insomnia, unspecified: Secondary | ICD-10-CM | POA: Diagnosis not present

## 2022-08-23 DIAGNOSIS — R5381 Other malaise: Secondary | ICD-10-CM | POA: Diagnosis not present

## 2022-08-23 DIAGNOSIS — I129 Hypertensive chronic kidney disease with stage 1 through stage 4 chronic kidney disease, or unspecified chronic kidney disease: Secondary | ICD-10-CM | POA: Diagnosis not present

## 2022-08-23 DIAGNOSIS — Z1331 Encounter for screening for depression: Secondary | ICD-10-CM | POA: Diagnosis not present

## 2022-08-23 DIAGNOSIS — E876 Hypokalemia: Secondary | ICD-10-CM | POA: Diagnosis not present

## 2022-08-23 DIAGNOSIS — K219 Gastro-esophageal reflux disease without esophagitis: Secondary | ICD-10-CM | POA: Diagnosis not present

## 2022-08-23 DIAGNOSIS — E079 Disorder of thyroid, unspecified: Secondary | ICD-10-CM | POA: Diagnosis not present

## 2022-08-23 DIAGNOSIS — E669 Obesity, unspecified: Secondary | ICD-10-CM | POA: Diagnosis not present

## 2022-08-23 DIAGNOSIS — N1832 Chronic kidney disease, stage 3b: Secondary | ICD-10-CM | POA: Diagnosis not present

## 2022-08-24 DIAGNOSIS — E079 Disorder of thyroid, unspecified: Secondary | ICD-10-CM | POA: Diagnosis not present

## 2022-08-24 DIAGNOSIS — M1711 Unilateral primary osteoarthritis, right knee: Secondary | ICD-10-CM | POA: Diagnosis not present

## 2022-08-24 DIAGNOSIS — N1832 Chronic kidney disease, stage 3b: Secondary | ICD-10-CM | POA: Diagnosis not present

## 2022-08-24 DIAGNOSIS — M25561 Pain in right knee: Secondary | ICD-10-CM | POA: Diagnosis not present

## 2022-08-24 DIAGNOSIS — I10 Essential (primary) hypertension: Secondary | ICD-10-CM | POA: Diagnosis not present

## 2022-08-24 DIAGNOSIS — G8929 Other chronic pain: Secondary | ICD-10-CM | POA: Diagnosis not present

## 2022-08-24 DIAGNOSIS — E538 Deficiency of other specified B group vitamins: Secondary | ICD-10-CM | POA: Diagnosis not present

## 2022-08-24 DIAGNOSIS — E876 Hypokalemia: Secondary | ICD-10-CM | POA: Diagnosis not present

## 2022-08-24 DIAGNOSIS — R5381 Other malaise: Secondary | ICD-10-CM | POA: Diagnosis not present

## 2022-08-24 DIAGNOSIS — M25562 Pain in left knee: Secondary | ICD-10-CM | POA: Diagnosis not present

## 2022-08-24 DIAGNOSIS — M1712 Unilateral primary osteoarthritis, left knee: Secondary | ICD-10-CM | POA: Diagnosis not present

## 2022-08-24 DIAGNOSIS — M170 Bilateral primary osteoarthritis of knee: Secondary | ICD-10-CM | POA: Diagnosis not present

## 2022-08-24 DIAGNOSIS — R5383 Other fatigue: Secondary | ICD-10-CM | POA: Diagnosis not present

## 2022-08-26 DIAGNOSIS — E538 Deficiency of other specified B group vitamins: Secondary | ICD-10-CM | POA: Diagnosis not present

## 2022-09-01 ENCOUNTER — Ambulatory Visit
Admission: RE | Admit: 2022-09-01 | Discharge: 2022-09-01 | Disposition: A | Discharge status: Home / Self Care | Payer: Medicare HMO | Source: Ambulatory Visit | Attending: Family Medicine | Admitting: Family Medicine

## 2022-09-01 DIAGNOSIS — Z1231 Encounter for screening mammogram for malignant neoplasm of breast: Secondary | ICD-10-CM | POA: Diagnosis not present

## 2022-09-02 DIAGNOSIS — E538 Deficiency of other specified B group vitamins: Secondary | ICD-10-CM | POA: Diagnosis not present

## 2022-09-10 DIAGNOSIS — E538 Deficiency of other specified B group vitamins: Secondary | ICD-10-CM | POA: Diagnosis not present

## 2022-09-16 DIAGNOSIS — N184 Chronic kidney disease, stage 4 (severe): Secondary | ICD-10-CM | POA: Diagnosis not present

## 2022-09-16 DIAGNOSIS — I10 Essential (primary) hypertension: Secondary | ICD-10-CM | POA: Diagnosis not present

## 2022-09-16 DIAGNOSIS — R600 Localized edema: Secondary | ICD-10-CM | POA: Diagnosis not present

## 2022-09-16 DIAGNOSIS — I129 Hypertensive chronic kidney disease with stage 1 through stage 4 chronic kidney disease, or unspecified chronic kidney disease: Secondary | ICD-10-CM | POA: Diagnosis not present

## 2022-09-17 DIAGNOSIS — E538 Deficiency of other specified B group vitamins: Secondary | ICD-10-CM | POA: Diagnosis not present

## 2022-09-20 ENCOUNTER — Encounter (INDEPENDENT_AMBULATORY_CARE_PROVIDER_SITE_OTHER): Payer: Self-pay

## 2022-10-05 ENCOUNTER — Inpatient Hospital Stay: Payer: Medicare HMO | Admitting: Oncology

## 2022-10-05 ENCOUNTER — Inpatient Hospital Stay: Payer: Medicare HMO

## 2022-10-26 ENCOUNTER — Encounter: Payer: Self-pay | Admitting: Oncology

## 2022-10-26 ENCOUNTER — Inpatient Hospital Stay: Payer: Medicare HMO | Attending: Oncology

## 2022-10-26 ENCOUNTER — Inpatient Hospital Stay (HOSPITAL_BASED_OUTPATIENT_CLINIC_OR_DEPARTMENT_OTHER): Payer: Medicare HMO | Admitting: Oncology

## 2022-10-26 VITALS — BP 124/63 | HR 64 | Temp 98.0°F | Resp 18 | Wt 188.2 lb

## 2022-10-26 DIAGNOSIS — Z87891 Personal history of nicotine dependence: Secondary | ICD-10-CM | POA: Diagnosis not present

## 2022-10-26 DIAGNOSIS — D509 Iron deficiency anemia, unspecified: Secondary | ICD-10-CM | POA: Diagnosis not present

## 2022-10-26 DIAGNOSIS — Z7901 Long term (current) use of anticoagulants: Secondary | ICD-10-CM | POA: Insufficient documentation

## 2022-10-26 DIAGNOSIS — Z86711 Personal history of pulmonary embolism: Secondary | ICD-10-CM | POA: Diagnosis not present

## 2022-10-26 LAB — CBC WITH DIFFERENTIAL/PLATELET
Abs Immature Granulocytes: 0.02 10*3/uL (ref 0.00–0.07)
Basophils Absolute: 0 10*3/uL (ref 0.0–0.1)
Basophils Relative: 0 %
Eosinophils Absolute: 0 10*3/uL (ref 0.0–0.5)
Eosinophils Relative: 1 %
HCT: 40.5 % (ref 36.0–46.0)
Hemoglobin: 13.6 g/dL (ref 12.0–15.0)
Immature Granulocytes: 0 %
Lymphocytes Relative: 31 %
Lymphs Abs: 2.2 10*3/uL (ref 0.7–4.0)
MCH: 29.6 pg (ref 26.0–34.0)
MCHC: 33.6 g/dL (ref 30.0–36.0)
MCV: 88.2 fL (ref 80.0–100.0)
Monocytes Absolute: 0.6 10*3/uL (ref 0.1–1.0)
Monocytes Relative: 8 %
Neutro Abs: 4.2 10*3/uL (ref 1.7–7.7)
Neutrophils Relative %: 60 %
Platelets: 285 10*3/uL (ref 150–400)
RBC: 4.59 MIL/uL (ref 3.87–5.11)
RDW: 12.9 % (ref 11.5–15.5)
WBC: 7.1 10*3/uL (ref 4.0–10.5)
nRBC: 0 % (ref 0.0–0.2)

## 2022-10-26 LAB — IRON AND TIBC
Iron: 71 ug/dL (ref 28–170)
Saturation Ratios: 19 % (ref 10.4–31.8)
TIBC: 370 ug/dL (ref 250–450)
UIBC: 299 ug/dL

## 2022-10-26 LAB — FERRITIN: Ferritin: 19 ng/mL (ref 11–307)

## 2022-10-26 NOTE — Progress Notes (Signed)
Pt states at times she experiences some dizzines with general weakness.

## 2022-10-27 ENCOUNTER — Telehealth: Payer: Self-pay | Admitting: Oncology

## 2022-10-27 NOTE — Telephone Encounter (Signed)
-----   Message from Sindy Guadeloupe, MD sent at 10/27/2022  9:22 AM EST ----- Ferritin is low. We can give her iv iron if she wants

## 2022-10-27 NOTE — Telephone Encounter (Signed)
VM left with patient. Per MD her Ferritin is low and if she would like, patient can have Venofer x 5 set up.

## 2022-10-31 ENCOUNTER — Encounter: Payer: Self-pay | Admitting: Oncology

## 2022-10-31 NOTE — Progress Notes (Signed)
Hematology/Oncology Consult note [facility]  [phone] [phone]  Patient Care Team: [name], MD as PCP - General (Family Medicine) Sindy Guadeloupe, MD as Consulting Physician (Oncology)   Name of the patient: Latoya Freeman  [id number]  [birth year]   Date of visit: 10/31/22  Diagnosis- 1.  History of unprovoked PE on Eliquis 2.  History of iron deficiency anemia  Chief complaint/ Reason for visit-routine follow-up of iron deficiency anemia and history of PE  Heme/Onc history: Patient is a 81 year old female who was seen by me in June 2020.  She was found to have unprovoked PE in December 2019 and has been on Eliquis since then.  She has now been referred to me for anemia.  Most recent CBC from 07/20/2020 showed H&H of 9.7/29 with an MCV of 83 ferritin levels were low at 12 and iron saturation low at 14%.  Patient has been following up with Dr. 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  She was evaluated by vascular surgery at [facility] for possible intervention of this ligament and subsequently underwent surgery with resolution of symptoms   Interval history-patient reports ongoing fatigue.  Denies any blood inAlso no stool or urine.  Occasional dizziness when she stands up.  ECOG PS- 1 Pain scale- 0   Review of systems- Review of Systems  Constitutional:  Positive for malaise/fatigue. Negative for chills, fever and weight loss.  HENT:  Negative for congestion, ear discharge and nosebleeds.   Eyes:  Negative for blurred vision.  Respiratory:  Negative for cough, hemoptysis, sputum production, shortness of breath and wheezing.   Cardiovascular:  Negative for chest pain,  palpitations, orthopnea and claudication.  Gastrointestinal:  Negative for abdominal pain, blood in stool, constipation, diarrhea, heartburn, melena, nausea and vomiting.  Genitourinary:  Negative for dysuria, flank pain, frequency, hematuria and urgency.  Musculoskeletal:  Negative for back pain, joint pain and myalgias.  Skin:  Negative for rash.  Neurological:  Negative for dizziness, tingling, focal weakness, seizures, weakness and headaches.  Endo/Heme/Allergies:  Does not bruise/bleed easily.  Psychiatric/Behavioral:  Negative for depression and suicidal ideas. The patient does not have insomnia.       Allergies  Allergen Reactions   Bee Venom Swelling   Shellfish Allergy Swelling   Tamoxifen Hives   Penicillins Hives, Rash and Other (See Comments)    Has patient had a PCN reaction causing immediate rash, facial/tongue/throat swelling, SOB or lightheadedness with hypotension: Unknown Has patient had a PCN reaction causing severe rash involving mucus membranes or skin necrosis: Unknown Has patient had a PCN reaction that required hospitalization: Unknown Has patient had a PCN reaction occurring within the last 10 years: No If all of the above answers are "NO", then may proceed with Cephalosporin use.      Past Medical History:  Diagnosis Date   Aneurysm ([place]) 1980   Arthritis    Cancer ([place])    GERD (gastroesophageal reflux disease)    Hypertension 1980   Hypothyroidism 1999   Pneumonia    Pulmonary embolism ([place])    Shingles    Wears dentures    full upper and lower     Past Surgical History:  Procedure Laterality Date   BRAIN SURGERY     aneurysm  BREAST CYST ASPIRATION Left 2005   BREAST EXCISIONAL BIOPSY Left 2013   atypical ductal hyperplasia   BREAST SURGERY  2013   LF Breast Wide EXC    CHOLECYSTECTOMY  2004   COLONOSCOPY     COLONOSCOPY WITH PROPOFOL N/A 01/31/2018   Procedure: COLONOSCOPY WITH PROPOFOL;  Surgeon: Lollie Sails, MD;  Location:  [facility] ENDOSCOPY;  Service: Endoscopy;  Laterality: N/A;   COLONOSCOPY WITH PROPOFOL N/A 08/06/2020   Procedure: COLONOSCOPY WITH PROPOFOL;  Surgeon: Virgel Manifold, MD;  Location: [facility] ENDOSCOPY;  Service: Endoscopy;  Laterality: N/A;   DILATION AND CURETTAGE OF UTERUS N/A 05/03/2019   Procedure: DILATATION AND CURETTAGE;  Surgeon: Gae Dry, MD;  Location: [facility] ORS;  Service: Gynecology;  Laterality: N/A;   ESOPHAGOGASTRODUODENOSCOPY N/A 01/31/2018   Procedure: ESOPHAGOGASTRODUODENOSCOPY (EGD);  Surgeon: Lollie Sails, MD;  Location: [facility] ENDOSCOPY;  Service: Endoscopy;  Laterality: N/A;   ESOPHAGOGASTRODUODENOSCOPY (EGD) WITH PROPOFOL N/A 08/06/2020   Procedure: ESOPHAGOGASTRODUODENOSCOPY (EGD) WITH PROPOFOL;  Surgeon: Virgel Manifold, MD;  Location: [facility] ENDOSCOPY;  Service: Endoscopy;  Laterality: N/A;   LUMBAR LAMINECTOMY/DECOMPRESSION MICRODISCECTOMY N/A 07/01/2021   Procedure: LEFT L3-4 MICRODSICECTOMY, L4-S1 DECOMPRESSION;  Surgeon: Meade Maw, MD;  Location: [facility] ORS;  Service: Neurosurgery;  Laterality: N/A;   LUMBAR PUNCTURE     SHOULDER ARTHROSCOPY Left 07/25/2018   Procedure: SHOULDER MINI OPEN ROTATOR CUFF REPAIR  BICEPS TENDOSIS ARTHROSCOPIC DISTAL CLAVICLE EXCISION  SUBACROMIAL DECOMP;  Surgeon: Leim Fabry, MD;  Location: [place];  Service: Orthopedics;  Laterality: Left;  [facility] WITH SPYDER SMITH & NEPHEW HEAD COIL ANCHOR FOOTPRINT ANCHOR QFIX ANCHOR   VISCERAL ANGIOGRAPHY N/A 08/14/2020   Procedure: VISCERAL ANGIOGRAPHY;  Surgeon: Algernon Huxley, MD;  Location: [place] CV LAB;  Service: Cardiovascular;  Laterality: N/A;   VISCERAL ANGIOGRAPHY N/A 09/03/2021   Procedure: VISCERAL ANGIOGRAPHY;  Surgeon: Algernon Huxley, MD;  Location: [place] CV LAB;  Service: Cardiovascular;  Laterality: N/A;    Social History   Socioeconomic History   Marital status: Divorced    Spouse name: Not on file   Number of children: Not on  file   Years of education: Not on file   Highest education level: Not on file  Occupational History   Not on file  Tobacco Use   Smoking status: Former    Packs/day: 1.50    Years: 20.00    Total pack years: 30.00    Types: Cigarettes    Quit date: 31    Years since quitting: 33.9   Smokeless tobacco: Never  Vaping Use   Vaping Use: Never used  Substance and Sexual Activity   Alcohol use: No   Drug use: No   Sexual activity: Not on file  Other Topics Concern   Not on file  Social History Narrative   Lives alone   Social Determinants of Health   Financial Resource Strain: Not on file  Food Insecurity: Not on file  Transportation Needs: Not on file  Physical Activity: Not on file  Stress: Not on file  Social Connections: Not on file  Intimate Partner Violence: Not on file    Family History  Problem Relation Age of Onset   Breast cancer Neg Hx      Current Outpatient Medications:    acetaminophen (TYLENOL) 325 MG tablet, Take 2 tablets (650 mg total) by mouth every 6 (six) hours as needed for mild pain, moderate pain, fever or headache (or Fever >/= 101)., Disp: , Rfl:  amLODipine (NORVASC) 5 MG tablet, Take 5 mg by mouth daily., Disp: , Rfl:    ELIQUIS 5 MG TABS tablet, TAKE 1 TABLET TWICE DAILY, Disp: 180 tablet, Rfl: 3   furosemide (LASIX) 20 MG tablet, Take 20 mg by mouth., Disp: , Rfl:    levothyroxine (SYNTHROID, LEVOTHROID) 75 MCG tablet, Take 75 mcg by mouth daily., Disp: , Rfl:    losartan (COZAAR) 50 MG tablet, Take 50 mg by mouth daily. , Disp: , Rfl:    omeprazole (PRILOSEC) 20 MG capsule, Take by mouth., Disp: , Rfl:    solifenacin (VESICARE) 5 MG tablet, Take 5 mg by mouth daily., Disp: , Rfl:    sucralfate (CARAFATE) 1 GM/10ML suspension, Take 10 mLs (1 g total) by mouth 4 (four) times daily -  with meals and at bedtime., Disp: 828 mL, Rfl: 0   triamterene-hydrochlorothiazide (MAXZIDE-25) 37.5-25 MG tablet, Take 1 tablet by mouth daily., Disp: ,  Rfl:    bisacodyl (DULCOLAX) 5 MG EC tablet, Take 1 tablet (5 mg total) by mouth daily as needed for moderate constipation. (Patient not taking: Reported on 03/31/2022), Disp: 30 tablet, Rfl: 0   cetirizine (ZYRTEC) 10 MG tablet, Take 10 mg by mouth daily as needed for allergies. (Patient not taking: Reported on 10/26/2022), Disp: , Rfl:    clopidogrel (PLAVIX) 75 MG tablet, Take 75 mg by mouth daily. (Patient not taking: Reported on 03/31/2022), Disp: , Rfl:    dexlansoprazole (DEXILANT) 60 MG capsule, Take 1 capsule (60 mg total) by mouth daily. (Patient not taking: Reported on 03/31/2022), Disp: 30 capsule, Rfl: 0   famotidine (PEPCID) 20 MG tablet, Take 1 tablet (20 mg total) by mouth at bedtime. (Patient not taking: Reported on 03/31/2022), Disp: 30 tablet, Rfl: 0   HYDROcodone-acetaminophen (NORCO/VICODIN) 5-325 MG tablet, Take 1 tablet by mouth every 6 (six) hours as needed for moderate pain or severe pain. (Patient not taking: Reported on 03/31/2022), Disp: 30 tablet, Rfl: 0   potassium chloride SA (KLOR-CON) 20 MEQ tablet, Take 1 tablet (20 mEq total) by mouth 2 (two) times daily. (Patient not taking: Reported on 03/31/2022), Disp: 14 tablet, Rfl: 0   senna (SENOKOT) 8.6 MG TABS tablet, Take 1 tablet (8.6 mg total) by mouth 2 (two) times daily. (Patient not taking: Reported on 03/31/2022), Disp: 120 tablet, Rfl: 0  Physical exam:  Vitals:   10/26/22 1508  BP: 124/63  Pulse: 64  Resp: 18  Temp: 98 F (36.7 C)  SpO2: 98%  Weight: 188 lb 3.2 oz (85.4 kg)   Physical Exam Cardiovascular:     Rate and Rhythm: Normal rate and regular rhythm.     Heart sounds: Normal heart sounds.  Pulmonary:     Effort: Pulmonary effort is normal.     Breath sounds: Normal breath sounds.  Abdominal:     General: Bowel sounds are normal.     Palpations: Abdomen is soft.  Skin:    General: Skin is warm and dry.  Neurological:     Mental Status: She is alert and oriented to person, place, and time.         Latest Ref Rng & Units 03/31/2022   12:46 PM  CMP  Glucose 70 - 99 mg/dL 105   BUN 8 - 23 mg/dL 14   Creatinine 0.44 - 1.00 mg/dL 1.53   Sodium 135 - 145 mmol/L 135   Potassium 3.5 - 5.1 mmol/L 3.4   Chloride 98 - 111 mmol/L 101   CO2 22 -  32 mmol/L 27   Calcium 8.9 - 10.3 mg/dL 10.0   Total Protein 6.5 - 8.1 g/dL 7.4   Total Bilirubin 0.3 - 1.2 mg/dL 0.7   Alkaline Phos 38 - 126 U/L 60   AST 15 - 41 U/L 19   ALT 0 - 44 U/L 15       Latest Ref Rng & Units 10/26/2022    2:45 PM  CBC  WBC 4.0 - 10.5 K/uL 7.1   Hemoglobin 12.0 - 15.0 g/dL 13.6   Hematocrit 36.0 - 46.0 % 40.5   Platelets 150 - 400 K/uL 285     No images are attached to the encounter.  No results found.   Assessment and plan- Patient is a 81 y.o. female who is here for follow-up of following issues  Iron deficiency anemia: Patient is not presently anemic with a hemoglobin of 13.6.  Ferritin levels are however low at 19 with an iron saturation of 19%.  Patient would like to proceed with IV iron given her fatigue.  Will set her up for 5 infusions of Venofer at this time.  Will repeat CBC ferritin and iron studies in 6 months and see her thereafter.  History of PE: Currently on eliquis.  She is tolerating it well without any significant side effects.   Visit Diagnosis 1. Iron deficiency anemia, unspecified iron deficiency anemia type   2. Long term current use of anticoagulant therapy      Dr. Randa Evens, MD, MPH [facility] [phone] 10/31/2022 11:26 AM

## 2022-10-31 NOTE — Addendum Note (Signed)
Addended by: Randa Evens C on: 10/31/2022 12:03 PM   Modules accepted: Orders

## 2022-11-09 ENCOUNTER — Inpatient Hospital Stay: Payer: Medicare HMO

## 2022-11-09 VITALS — BP 131/63 | HR 70 | Temp 98.8°F | Resp 18

## 2022-11-09 DIAGNOSIS — D508 Other iron deficiency anemias: Secondary | ICD-10-CM

## 2022-11-09 DIAGNOSIS — D509 Iron deficiency anemia, unspecified: Secondary | ICD-10-CM | POA: Diagnosis not present

## 2022-11-09 DIAGNOSIS — Z87891 Personal history of nicotine dependence: Secondary | ICD-10-CM | POA: Diagnosis not present

## 2022-11-09 DIAGNOSIS — Z86711 Personal history of pulmonary embolism: Secondary | ICD-10-CM | POA: Diagnosis not present

## 2022-11-09 DIAGNOSIS — Z7901 Long term (current) use of anticoagulants: Secondary | ICD-10-CM | POA: Diagnosis not present

## 2022-11-09 MED ORDER — SODIUM CHLORIDE 0.9 % IV SOLN
INTRAVENOUS | Status: DC
  Filled 2022-11-09: qty 250

## 2022-11-09 MED ORDER — SODIUM CHLORIDE 0.9 % IV SOLN
200.0000 mg | INTRAVENOUS | Status: DC
  Administered 2022-11-09: 200 mg via INTRAVENOUS
  Filled 2022-11-09: qty 200

## 2022-11-09 NOTE — Patient Instructions (Signed)

## 2022-11-18 ENCOUNTER — Inpatient Hospital Stay: Payer: Medicare HMO

## 2022-11-18 VITALS — BP 125/53 | HR 73 | Temp 98.8°F | Resp 18

## 2022-11-18 DIAGNOSIS — Z86711 Personal history of pulmonary embolism: Secondary | ICD-10-CM | POA: Diagnosis not present

## 2022-11-18 DIAGNOSIS — D508 Other iron deficiency anemias: Secondary | ICD-10-CM

## 2022-11-18 DIAGNOSIS — Z7901 Long term (current) use of anticoagulants: Secondary | ICD-10-CM | POA: Diagnosis not present

## 2022-11-18 DIAGNOSIS — Z87891 Personal history of nicotine dependence: Secondary | ICD-10-CM | POA: Diagnosis not present

## 2022-11-18 DIAGNOSIS — D509 Iron deficiency anemia, unspecified: Secondary | ICD-10-CM | POA: Diagnosis not present

## 2022-11-18 MED ORDER — SODIUM CHLORIDE 0.9 % IV SOLN
INTRAVENOUS | Status: DC
  Filled 2022-11-18: qty 250

## 2022-11-18 MED ORDER — SODIUM CHLORIDE 0.9 % IV SOLN
200.0000 mg | INTRAVENOUS | Status: DC
  Administered 2022-11-18: 200 mg via INTRAVENOUS
  Filled 2022-11-18: qty 200

## 2022-11-18 NOTE — Patient Instructions (Signed)

## 2022-11-24 DIAGNOSIS — M1711 Unilateral primary osteoarthritis, right knee: Secondary | ICD-10-CM | POA: Diagnosis not present

## 2022-11-24 DIAGNOSIS — M25461 Effusion, right knee: Secondary | ICD-10-CM | POA: Diagnosis not present

## 2022-11-24 DIAGNOSIS — G8929 Other chronic pain: Secondary | ICD-10-CM | POA: Diagnosis not present

## 2022-11-24 DIAGNOSIS — M1712 Unilateral primary osteoarthritis, left knee: Secondary | ICD-10-CM | POA: Diagnosis not present

## 2022-11-24 DIAGNOSIS — M170 Bilateral primary osteoarthritis of knee: Secondary | ICD-10-CM | POA: Diagnosis not present

## 2022-11-24 DIAGNOSIS — M25562 Pain in left knee: Secondary | ICD-10-CM | POA: Diagnosis not present

## 2022-11-24 DIAGNOSIS — M25561 Pain in right knee: Secondary | ICD-10-CM | POA: Diagnosis not present

## 2022-11-29 ENCOUNTER — Inpatient Hospital Stay: Payer: Medicare HMO | Attending: Oncology

## 2022-11-29 DIAGNOSIS — Z7901 Long term (current) use of anticoagulants: Secondary | ICD-10-CM | POA: Insufficient documentation

## 2022-11-29 DIAGNOSIS — D509 Iron deficiency anemia, unspecified: Secondary | ICD-10-CM | POA: Insufficient documentation

## 2022-11-29 DIAGNOSIS — Z86711 Personal history of pulmonary embolism: Secondary | ICD-10-CM | POA: Insufficient documentation

## 2022-11-29 DIAGNOSIS — Z87891 Personal history of nicotine dependence: Secondary | ICD-10-CM | POA: Insufficient documentation

## 2022-11-30 ENCOUNTER — Inpatient Hospital Stay: Payer: Medicare HMO

## 2022-12-02 ENCOUNTER — Inpatient Hospital Stay: Payer: Medicare HMO

## 2022-12-03 ENCOUNTER — Other Ambulatory Visit: Payer: Self-pay | Admitting: Nurse Practitioner

## 2022-12-03 ENCOUNTER — Other Ambulatory Visit: Payer: Self-pay | Admitting: Oncology

## 2022-12-03 ENCOUNTER — Inpatient Hospital Stay: Payer: Medicare HMO

## 2022-12-03 VITALS — BP 140/68 | HR 62 | Temp 97.5°F | Resp 18

## 2022-12-03 DIAGNOSIS — Z87891 Personal history of nicotine dependence: Secondary | ICD-10-CM | POA: Diagnosis not present

## 2022-12-03 DIAGNOSIS — D509 Iron deficiency anemia, unspecified: Secondary | ICD-10-CM | POA: Diagnosis not present

## 2022-12-03 DIAGNOSIS — Z86711 Personal history of pulmonary embolism: Secondary | ICD-10-CM | POA: Diagnosis not present

## 2022-12-03 DIAGNOSIS — Z7901 Long term (current) use of anticoagulants: Secondary | ICD-10-CM | POA: Diagnosis not present

## 2022-12-03 DIAGNOSIS — D508 Other iron deficiency anemias: Secondary | ICD-10-CM

## 2022-12-03 MED ORDER — SODIUM CHLORIDE 0.9 % IV SOLN
INTRAVENOUS | Status: DC
  Filled 2022-12-03: qty 250

## 2022-12-03 MED ORDER — SODIUM CHLORIDE 0.9 % IV SOLN
200.0000 mg | INTRAVENOUS | Status: DC
  Administered 2022-12-03: 200 mg via INTRAVENOUS
  Filled 2022-12-03: qty 200

## 2022-12-03 NOTE — Progress Notes (Signed)
Venofer orders added on behalf of Dr Janese Banks

## 2022-12-09 ENCOUNTER — Inpatient Hospital Stay: Payer: Medicare HMO

## 2022-12-09 VITALS — BP 137/68 | HR 63 | Temp 96.6°F | Resp 16

## 2022-12-09 DIAGNOSIS — D508 Other iron deficiency anemias: Secondary | ICD-10-CM

## 2022-12-09 DIAGNOSIS — D509 Iron deficiency anemia, unspecified: Secondary | ICD-10-CM | POA: Diagnosis not present

## 2022-12-09 DIAGNOSIS — Z86711 Personal history of pulmonary embolism: Secondary | ICD-10-CM | POA: Diagnosis not present

## 2022-12-09 DIAGNOSIS — Z87891 Personal history of nicotine dependence: Secondary | ICD-10-CM | POA: Diagnosis not present

## 2022-12-09 DIAGNOSIS — Z7901 Long term (current) use of anticoagulants: Secondary | ICD-10-CM | POA: Diagnosis not present

## 2022-12-09 MED ORDER — SODIUM CHLORIDE 0.9 % IV SOLN
200.0000 mg | Freq: Once | INTRAVENOUS | Status: AC
  Administered 2022-12-09: 200 mg via INTRAVENOUS
  Filled 2022-12-09: qty 200

## 2022-12-09 MED ORDER — SODIUM CHLORIDE 0.9 % IV SOLN
INTRAVENOUS | Status: DC
  Filled 2022-12-09 (×2): qty 250

## 2022-12-09 NOTE — Patient Instructions (Signed)
Iron Sucrose Injection What is this medication? IRON SUCROSE (EYE ern SOO krose) treats low levels of iron (iron deficiency anemia) in people with kidney disease. Iron is a mineral that plays an important role in making red blood cells, which carry oxygen from your lungs to the rest of your body. This medicine may be used for other purposes; ask your health care provider or pharmacist if you have questions. COMMON BRAND NAME(S): Venofer What should I tell my care team before I take this medication? They need to know if you have any of these conditions: Anemia not caused by low iron levels Heart disease High levels of iron in the blood Kidney disease Liver disease An unusual or allergic reaction to iron, other medications, foods, dyes, or preservatives Pregnant or trying to get pregnant Breastfeeding How should I use this medication? This medication is for infusion into a vein. It is given in a hospital or clinic setting. Talk to your care team about the use of this medication in children. While this medication may be prescribed for children as young as 2 years for selected conditions, precautions do apply. Overdosage: If you think you have taken too much of this medicine contact a poison control center or emergency room at once. NOTE: This medicine is only for you. Do not share this medicine with others. What if I miss a dose? Keep appointments for follow-up doses. It is important not to miss your dose. Call your care team if you are unable to keep an appointment. What may interact with this medication? Do not take this medication with any of the following: Deferoxamine Dimercaprol Other iron products This medication may also interact with the following: Chloramphenicol Deferasirox This list may not describe all possible interactions. Give your health care provider a list of all the medicines, herbs, non-prescription drugs, or dietary supplements you use. Also tell them if you smoke,  drink alcohol, or use illegal drugs. Some items may interact with your medicine. What should I watch for while using this medication? Visit your care team regularly. Tell your care team if your symptoms do not start to get better or if they get worse. You may need blood work done while you are taking this medication. You may need to follow a special diet. Talk to your care team. Foods that contain iron include: whole grains/cereals, dried fruits, beans, or peas, leafy green vegetables, and organ meats (liver, kidney). What side effects may I notice from receiving this medication? Side effects that you should report to your care team as soon as possible: Allergic reactions--skin rash, itching, hives, swelling of the face, lips, tongue, or throat Low blood pressure--dizziness, feeling faint or lightheaded, blurry vision Shortness of breath Side effects that usually do not require medical attention (report to your care team if they continue or are bothersome): Flushing Headache Joint pain Muscle pain Nausea Pain, redness, or irritation at injection site This list may not describe all possible side effects. Call your doctor for medical advice about side effects. You may report side effects to FDA at 1-800-FDA-1088. Where should I keep my medication? This medication is given in a hospital or clinic and will not be stored at home. NOTE: This sheet is a summary. It may not cover all possible information. If you have questions about this medicine, talk to your doctor, pharmacist, or health care provider.  2023 Elsevier/Gold Standard (2021-02-19 00:00:00)

## 2022-12-24 DIAGNOSIS — Z961 Presence of intraocular lens: Secondary | ICD-10-CM | POA: Diagnosis not present

## 2022-12-24 DIAGNOSIS — H401124 Primary open-angle glaucoma, left eye, indeterminate stage: Secondary | ICD-10-CM | POA: Diagnosis not present

## 2022-12-24 DIAGNOSIS — Z0100 Encounter for examination of eyes and vision without abnormal findings: Secondary | ICD-10-CM | POA: Diagnosis not present

## 2023-01-17 DIAGNOSIS — I129 Hypertensive chronic kidney disease with stage 1 through stage 4 chronic kidney disease, or unspecified chronic kidney disease: Secondary | ICD-10-CM | POA: Diagnosis not present

## 2023-01-17 DIAGNOSIS — I10 Essential (primary) hypertension: Secondary | ICD-10-CM | POA: Diagnosis not present

## 2023-01-24 DIAGNOSIS — H401124 Primary open-angle glaucoma, left eye, indeterminate stage: Secondary | ICD-10-CM | POA: Diagnosis not present

## 2023-02-22 DIAGNOSIS — N2581 Secondary hyperparathyroidism of renal origin: Secondary | ICD-10-CM | POA: Diagnosis not present

## 2023-02-22 DIAGNOSIS — K219 Gastro-esophageal reflux disease without esophagitis: Secondary | ICD-10-CM | POA: Diagnosis not present

## 2023-02-22 DIAGNOSIS — K432 Incisional hernia without obstruction or gangrene: Secondary | ICD-10-CM | POA: Diagnosis not present

## 2023-02-22 DIAGNOSIS — E079 Disorder of thyroid, unspecified: Secondary | ICD-10-CM | POA: Diagnosis not present

## 2023-02-22 DIAGNOSIS — I129 Hypertensive chronic kidney disease with stage 1 through stage 4 chronic kidney disease, or unspecified chronic kidney disease: Secondary | ICD-10-CM | POA: Diagnosis not present

## 2023-02-22 DIAGNOSIS — N1832 Chronic kidney disease, stage 3b: Secondary | ICD-10-CM | POA: Diagnosis not present

## 2023-02-22 DIAGNOSIS — I2699 Other pulmonary embolism without acute cor pulmonale: Secondary | ICD-10-CM | POA: Diagnosis not present

## 2023-02-22 DIAGNOSIS — Z6837 Body mass index (BMI) 37.0-37.9, adult: Secondary | ICD-10-CM | POA: Diagnosis not present

## 2023-02-25 DIAGNOSIS — G8929 Other chronic pain: Secondary | ICD-10-CM | POA: Diagnosis not present

## 2023-02-25 DIAGNOSIS — M170 Bilateral primary osteoarthritis of knee: Secondary | ICD-10-CM | POA: Diagnosis not present

## 2023-04-25 DIAGNOSIS — D508 Other iron deficiency anemias: Secondary | ICD-10-CM | POA: Diagnosis not present

## 2023-04-25 DIAGNOSIS — I129 Hypertensive chronic kidney disease with stage 1 through stage 4 chronic kidney disease, or unspecified chronic kidney disease: Secondary | ICD-10-CM | POA: Diagnosis not present

## 2023-04-27 ENCOUNTER — Inpatient Hospital Stay: Payer: Medicare HMO

## 2023-04-27 ENCOUNTER — Encounter: Payer: Self-pay | Admitting: Oncology

## 2023-04-27 ENCOUNTER — Encounter: Payer: Self-pay | Admitting: Nephrology

## 2023-05-03 ENCOUNTER — Inpatient Hospital Stay: Payer: Medicare HMO | Attending: Oncology

## 2023-06-01 DIAGNOSIS — H401124 Primary open-angle glaucoma, left eye, indeterminate stage: Secondary | ICD-10-CM | POA: Diagnosis not present

## 2023-06-08 DIAGNOSIS — K219 Gastro-esophageal reflux disease without esophagitis: Secondary | ICD-10-CM | POA: Diagnosis not present

## 2023-06-08 DIAGNOSIS — R051 Acute cough: Secondary | ICD-10-CM | POA: Diagnosis not present

## 2023-06-08 DIAGNOSIS — M545 Low back pain, unspecified: Secondary | ICD-10-CM | POA: Diagnosis not present

## 2023-06-08 DIAGNOSIS — N1832 Chronic kidney disease, stage 3b: Secondary | ICD-10-CM | POA: Diagnosis not present

## 2023-06-10 DIAGNOSIS — H401123 Primary open-angle glaucoma, left eye, severe stage: Secondary | ICD-10-CM | POA: Diagnosis not present

## 2023-06-10 DIAGNOSIS — Z0100 Encounter for examination of eyes and vision without abnormal findings: Secondary | ICD-10-CM | POA: Diagnosis not present

## 2023-06-10 DIAGNOSIS — H401112 Primary open-angle glaucoma, right eye, moderate stage: Secondary | ICD-10-CM | POA: Diagnosis not present

## 2023-06-14 DIAGNOSIS — M545 Low back pain, unspecified: Secondary | ICD-10-CM | POA: Diagnosis not present

## 2023-06-14 DIAGNOSIS — M5459 Other low back pain: Secondary | ICD-10-CM | POA: Diagnosis not present

## 2023-06-14 DIAGNOSIS — G8929 Other chronic pain: Secondary | ICD-10-CM | POA: Diagnosis not present

## 2023-07-07 ENCOUNTER — Other Ambulatory Visit: Payer: Self-pay | Admitting: *Deleted

## 2023-07-07 ENCOUNTER — Encounter: Payer: Self-pay | Admitting: Oncology

## 2023-07-07 MED ORDER — APIXABAN 5 MG PO TABS: 5.0000 mg | ORAL_TABLET | Freq: Two times a day (BID) | ORAL | 2 refills | Status: DC

## 2023-08-05 ENCOUNTER — Encounter: Payer: Self-pay | Admitting: Oncology

## 2023-08-10 ENCOUNTER — Encounter: Payer: Self-pay | Admitting: Gastroenterology

## 2023-08-10 ENCOUNTER — Ambulatory Visit (INDEPENDENT_AMBULATORY_CARE_PROVIDER_SITE_OTHER): Payer: Medicare HMO | Admitting: Gastroenterology

## 2023-08-10 VITALS — BP 155/84 | HR 60 | Temp 97.9°F | Wt 196.0 lb

## 2023-08-10 DIAGNOSIS — K219 Gastro-esophageal reflux disease without esophagitis: Secondary | ICD-10-CM

## 2023-08-10 DIAGNOSIS — R12 Heartburn: Secondary | ICD-10-CM

## 2023-08-10 DIAGNOSIS — R1110 Vomiting, unspecified: Secondary | ICD-10-CM | POA: Diagnosis not present

## 2023-08-10 DIAGNOSIS — K30 Functional dyspepsia: Secondary | ICD-10-CM

## 2023-08-10 MED ORDER — VOQUEZNA 20 MG PO TABS: 1.0000 | ORAL_TABLET | Freq: Every day | ORAL | Status: DC

## 2023-08-10 NOTE — Progress Notes (Unsigned)
Primary Care Physician: Jerl Mina, MD  Primary Gastroenterologist:  Dr. Midge Minium  Chief Complaint  Patient presents with   Abdominal Pain   Emesis    HPI: Latoya Freeman is a 82 y.o. female here who has a history of GERD.  The patient was seen by Dr. Maximino Greenland in the past and had a pH study that showed the symptom association probability to be 0 for her symptoms with no pathological reflux.  The patient also states that she continues to have heartburn every night and has to take chicken before she goes to bed to help with the heartburn when she sleeps.  She also has nausea with vomiting in the morning.  This all started when she was diagnosed and treated with viral meningitis.  The patient has been on omeprazole for some time and despite this continues to have symptoms of heartburn.  She does state when she vomits she vomits up green bile.  Past Medical History:  Diagnosis Date   Aneurysm ([facility]) 1980   Arthritis    Cancer ([facility])    GERD (gastroesophageal reflux disease)    Hypertension 1980   Hypothyroidism 1999   Pneumonia    Pulmonary embolism ([facility])    Shingles    Wears dentures    full upper and lower    Current Outpatient Medications  Medication Sig Dispense Refill   acetaminophen (TYLENOL) 325 MG tablet Take 2 tablets (650 mg total) by mouth every 6 (six) hours as needed for mild pain, moderate pain, fever or headache (or Fever >/= 101).     apixaban (ELIQUIS) 5 MG TABS tablet Take 1 tablet (5 mg total) by mouth 2 (two) times daily. 180 tablet 2   furosemide (LASIX) 20 MG tablet Take 20 mg by mouth.     levothyroxine (SYNTHROID, LEVOTHROID) 75 MCG tablet Take 75 mcg by mouth daily.     losartan (COZAAR) 50 MG tablet Take 50 mg by mouth daily.      omeprazole (PRILOSEC) 20 MG capsule Take by mouth.     senna (SENOKOT) 8.6 MG TABS tablet Take 1 tablet (8.6 mg total) by mouth 2 (two) times daily. 120 tablet 0   solifenacin (VESICARE) 5 MG tablet Take 5 mg by mouth  daily.     sucralfate (CARAFATE) 1 GM/10ML suspension Take 10 mLs (1 g total) by mouth 4 (four) times daily -  with meals and at bedtime. 828 mL 0   bisacodyl (DULCOLAX) 5 MG EC tablet Take 1 tablet (5 mg total) by mouth daily as needed for moderate constipation. (Patient not taking: Reported on 08/10/2023) 30 tablet 0   cetirizine (ZYRTEC) 10 MG tablet Take 10 mg by mouth daily as needed for allergies. (Patient not taking: Reported on 08/10/2023)     potassium chloride SA (KLOR-CON) 20 MEQ tablet Take 1 tablet (20 mEq total) by mouth 2 (two) times daily. (Patient not taking: Reported on 08/10/2023) 14 tablet 0   No current facility-administered medications for this visit.    Allergies as of 08/10/2023 - Review Complete 08/10/2023  Allergen Reaction Noted   Bee venom Swelling 01/31/2018   Shellfish allergy Swelling 01/31/2018   Tamoxifen Hives 07/20/2018   Penicillins Hives, Rash, and Other (See Comments) 12/21/2012    ROS:  General: Negative for anorexia, weight loss, fever, chills, fatigue, weakness. ENT: Negative for hoarseness, difficulty swallowing , nasal congestion. CV: Negative for chest pain, angina, palpitations, dyspnea on exertion, peripheral edema.  Respiratory: Negative for dyspnea at  rest, dyspnea on exertion, cough, sputum, wheezing.  GI: See history of present illness. GU:  Negative for dysuria, hematuria, urinary incontinence, urinary frequency, nocturnal urination.  Endo: Negative for unusual weight change.    Physical Examination:   BP (!) 155/84 (BP Location: Left Wrist, Patient Position: Sitting, Cuff Size: Normal)   Pulse 60   Temp 97.9 F (36.6 C) (Oral)   Wt 196 lb (88.9 kg)   BMI 37.03 kg/m   General: Well-nourished, well-developed in no acute distress.  Eyes: No icterus. Conjunctivae pink. Extremities: No lower extremity edema. No clubbing or deformities. Neuro: Alert and oriented x 3.  Grossly intact. Skin: Warm and dry, no jaundice.   Psych: Alert  and cooperative, normal mood and affect.  Labs:    Imaging Studies: No results found.  Assessment and Plan:   Latoya Freeman is a 82 y.o. y/o female who comes in with chronic heartburn symptoms where she states that she has burning in her chest that is only helped by eating chicken before she goes to sleep but wakes up vomiting.  The patient had a pH study that showed no pathological reflux and impedance studies that also did not show this to be the cause of her symptoms.  The patient will be tried on Voquenza as a trial to see if this helps her heartburn symptoms although her pH studies and impedance have been negative.  The patient has also been told that some of the bile reflux that she has may be helped with zinc and she will try zinc supplementation also.  The patient has been explained the plan agrees with it.     Midge Minium, MD. Clementeen Graham    Note: This dictation was prepared with Dragon dictation along with smaller phrase technology. Any transcriptional errors that result from this process are unintentional.

## 2023-08-18 ENCOUNTER — Encounter: Payer: Self-pay | Admitting: Gastroenterology

## 2023-08-22 ENCOUNTER — Telehealth: Payer: Self-pay

## 2023-08-22 NOTE — Telephone Encounter (Signed)
Patient left a voicemail on front line stating that she sent a mychart message and she is needed a prescription sent in of the medication that she was given samples of. She states she wants this sent to [facility] on [address] and the medication name is Voquenza. She has one more day of medication left

## 2023-08-23 MED ORDER — VOQUEZNA 20 MG PO TABS: 1.0000 | ORAL_TABLET | Freq: Every day | ORAL | 5 refills | Status: DC

## 2023-08-23 NOTE — Telephone Encounter (Signed)
PA submitted via covermymeds, [employer], awaiting response

## 2023-08-24 NOTE — Telephone Encounter (Signed)
PA has been approved I called [facility] x 2 no one ever answered and I was disconnected the 2nd time  Left message on voicemail

## 2023-08-24 NOTE — Telephone Encounter (Signed)
Pt called and stated that the Voquezna has not helped with symptoms and she will not get the Rx from the pharmacy

## 2023-09-06 DIAGNOSIS — Z961 Presence of intraocular lens: Secondary | ICD-10-CM | POA: Diagnosis not present

## 2023-09-06 DIAGNOSIS — H401123 Primary open-angle glaucoma, left eye, severe stage: Secondary | ICD-10-CM | POA: Diagnosis not present

## 2023-09-06 DIAGNOSIS — H401112 Primary open-angle glaucoma, right eye, moderate stage: Secondary | ICD-10-CM | POA: Diagnosis not present

## 2023-09-16 DIAGNOSIS — M7061 Trochanteric bursitis, right hip: Secondary | ICD-10-CM | POA: Diagnosis not present

## 2023-09-16 DIAGNOSIS — M1711 Unilateral primary osteoarthritis, right knee: Secondary | ICD-10-CM | POA: Diagnosis not present

## 2023-09-28 DIAGNOSIS — R8290 Unspecified abnormal findings in urine: Secondary | ICD-10-CM | POA: Diagnosis not present

## 2023-09-28 DIAGNOSIS — I129 Hypertensive chronic kidney disease with stage 1 through stage 4 chronic kidney disease, or unspecified chronic kidney disease: Secondary | ICD-10-CM | POA: Diagnosis not present

## 2023-09-28 DIAGNOSIS — N2581 Secondary hyperparathyroidism of renal origin: Secondary | ICD-10-CM | POA: Diagnosis not present

## 2023-09-28 DIAGNOSIS — N1832 Chronic kidney disease, stage 3b: Secondary | ICD-10-CM | POA: Diagnosis not present

## 2023-09-28 DIAGNOSIS — D631 Anemia in chronic kidney disease: Secondary | ICD-10-CM | POA: Diagnosis not present

## 2023-10-05 DIAGNOSIS — M1711 Unilateral primary osteoarthritis, right knee: Secondary | ICD-10-CM | POA: Diagnosis not present

## 2023-10-05 DIAGNOSIS — G8929 Other chronic pain: Secondary | ICD-10-CM | POA: Diagnosis not present

## 2023-10-06 DIAGNOSIS — N184 Chronic kidney disease, stage 4 (severe): Secondary | ICD-10-CM | POA: Diagnosis not present

## 2023-10-06 DIAGNOSIS — Z8661 Personal history of infections of the central nervous system: Secondary | ICD-10-CM | POA: Diagnosis not present

## 2023-10-06 DIAGNOSIS — Z7901 Long term (current) use of anticoagulants: Secondary | ICD-10-CM | POA: Diagnosis not present

## 2023-10-06 DIAGNOSIS — Z86711 Personal history of pulmonary embolism: Secondary | ICD-10-CM | POA: Diagnosis not present

## 2023-10-06 DIAGNOSIS — K219 Gastro-esophageal reflux disease without esophagitis: Secondary | ICD-10-CM | POA: Diagnosis not present

## 2023-10-06 DIAGNOSIS — R519 Headache, unspecified: Secondary | ICD-10-CM | POA: Diagnosis not present

## 2023-10-06 DIAGNOSIS — Z9889 Other specified postprocedural states: Secondary | ICD-10-CM | POA: Diagnosis not present

## 2023-10-06 DIAGNOSIS — M5416 Radiculopathy, lumbar region: Secondary | ICD-10-CM | POA: Diagnosis not present

## 2023-10-06 DIAGNOSIS — R110 Nausea: Secondary | ICD-10-CM | POA: Diagnosis not present

## 2023-10-12 DIAGNOSIS — I10 Essential (primary) hypertension: Secondary | ICD-10-CM | POA: Diagnosis not present

## 2023-10-12 DIAGNOSIS — E079 Disorder of thyroid, unspecified: Secondary | ICD-10-CM | POA: Diagnosis not present

## 2023-10-19 DIAGNOSIS — Z23 Encounter for immunization: Secondary | ICD-10-CM | POA: Diagnosis not present

## 2023-10-19 DIAGNOSIS — Z0000 Encounter for general adult medical examination without abnormal findings: Secondary | ICD-10-CM | POA: Diagnosis not present

## 2023-10-19 DIAGNOSIS — Z1331 Encounter for screening for depression: Secondary | ICD-10-CM | POA: Diagnosis not present

## 2023-10-19 DIAGNOSIS — K219 Gastro-esophageal reflux disease without esophagitis: Secondary | ICD-10-CM | POA: Diagnosis not present

## 2023-10-19 DIAGNOSIS — R4189 Other symptoms and signs involving cognitive functions and awareness: Secondary | ICD-10-CM | POA: Diagnosis not present

## 2023-10-19 DIAGNOSIS — R519 Headache, unspecified: Secondary | ICD-10-CM | POA: Diagnosis not present

## 2023-10-19 DIAGNOSIS — R42 Dizziness and giddiness: Secondary | ICD-10-CM | POA: Diagnosis not present

## 2023-10-19 DIAGNOSIS — E079 Disorder of thyroid, unspecified: Secondary | ICD-10-CM | POA: Diagnosis not present

## 2023-10-19 DIAGNOSIS — I129 Hypertensive chronic kidney disease with stage 1 through stage 4 chronic kidney disease, or unspecified chronic kidney disease: Secondary | ICD-10-CM | POA: Diagnosis not present

## 2023-10-19 DIAGNOSIS — E039 Hypothyroidism, unspecified: Secondary | ICD-10-CM | POA: Diagnosis not present

## 2023-10-19 DIAGNOSIS — N184 Chronic kidney disease, stage 4 (severe): Secondary | ICD-10-CM | POA: Diagnosis not present

## 2023-10-19 DIAGNOSIS — I10 Essential (primary) hypertension: Secondary | ICD-10-CM | POA: Diagnosis not present

## 2023-10-24 DIAGNOSIS — I129 Hypertensive chronic kidney disease with stage 1 through stage 4 chronic kidney disease, or unspecified chronic kidney disease: Secondary | ICD-10-CM | POA: Diagnosis not present

## 2023-10-24 DIAGNOSIS — N2581 Secondary hyperparathyroidism of renal origin: Secondary | ICD-10-CM | POA: Diagnosis not present

## 2023-10-24 DIAGNOSIS — N1832 Chronic kidney disease, stage 3b: Secondary | ICD-10-CM | POA: Diagnosis not present

## 2023-10-24 DIAGNOSIS — R809 Proteinuria, unspecified: Secondary | ICD-10-CM | POA: Diagnosis not present

## 2023-10-24 DIAGNOSIS — E876 Hypokalemia: Secondary | ICD-10-CM | POA: Diagnosis not present

## 2023-10-24 DIAGNOSIS — D631 Anemia in chronic kidney disease: Secondary | ICD-10-CM | POA: Diagnosis not present

## 2023-10-25 DIAGNOSIS — R42 Dizziness and giddiness: Secondary | ICD-10-CM | POA: Diagnosis not present

## 2023-10-25 DIAGNOSIS — G8929 Other chronic pain: Secondary | ICD-10-CM | POA: Diagnosis not present

## 2023-10-25 DIAGNOSIS — Z8661 Personal history of infections of the central nervous system: Secondary | ICD-10-CM | POA: Diagnosis not present

## 2023-10-25 DIAGNOSIS — M545 Low back pain, unspecified: Secondary | ICD-10-CM | POA: Diagnosis not present

## 2023-10-25 DIAGNOSIS — R519 Headache, unspecified: Secondary | ICD-10-CM | POA: Diagnosis not present

## 2023-10-25 DIAGNOSIS — Z7689 Persons encountering health services in other specified circumstances: Secondary | ICD-10-CM | POA: Diagnosis not present

## 2023-10-25 DIAGNOSIS — G479 Sleep disorder, unspecified: Secondary | ICD-10-CM | POA: Diagnosis not present

## 2023-10-27 ENCOUNTER — Other Ambulatory Visit: Payer: Self-pay | Admitting: Family Medicine

## 2023-10-27 DIAGNOSIS — N1832 Chronic kidney disease, stage 3b: Secondary | ICD-10-CM | POA: Diagnosis not present

## 2023-10-27 DIAGNOSIS — R809 Proteinuria, unspecified: Secondary | ICD-10-CM | POA: Diagnosis not present

## 2023-10-27 DIAGNOSIS — I129 Hypertensive chronic kidney disease with stage 1 through stage 4 chronic kidney disease, or unspecified chronic kidney disease: Secondary | ICD-10-CM | POA: Diagnosis not present

## 2023-10-27 DIAGNOSIS — E876 Hypokalemia: Secondary | ICD-10-CM | POA: Diagnosis not present

## 2023-10-27 DIAGNOSIS — D631 Anemia in chronic kidney disease: Secondary | ICD-10-CM | POA: Diagnosis not present

## 2023-10-27 DIAGNOSIS — Z1231 Encounter for screening mammogram for malignant neoplasm of breast: Secondary | ICD-10-CM

## 2023-10-27 DIAGNOSIS — N2581 Secondary hyperparathyroidism of renal origin: Secondary | ICD-10-CM | POA: Diagnosis not present

## 2023-10-27 DIAGNOSIS — J449 Chronic obstructive pulmonary disease, unspecified: Secondary | ICD-10-CM | POA: Diagnosis not present

## 2023-10-28 ENCOUNTER — Inpatient Hospital Stay: Payer: Medicare HMO

## 2023-10-28 ENCOUNTER — Inpatient Hospital Stay: Payer: Medicare HMO | Admitting: Oncology

## 2023-11-24 ENCOUNTER — Ambulatory Visit
Admission: RE | Admit: 2023-11-24 | Discharge: 2023-11-24 | Disposition: A | Discharge status: Home / Self Care | Payer: Medicare HMO | Source: Ambulatory Visit | Attending: Family Medicine | Admitting: Family Medicine

## 2023-11-24 DIAGNOSIS — Z1231 Encounter for screening mammogram for malignant neoplasm of breast: Secondary | ICD-10-CM | POA: Diagnosis not present

## 2023-12-04 ENCOUNTER — Other Ambulatory Visit: Payer: Self-pay | Admitting: *Deleted

## 2023-12-04 DIAGNOSIS — N1832 Chronic kidney disease, stage 3b: Secondary | ICD-10-CM

## 2023-12-04 DIAGNOSIS — D509 Iron deficiency anemia, unspecified: Secondary | ICD-10-CM

## 2023-12-05 ENCOUNTER — Inpatient Hospital Stay: Payer: Medicare HMO

## 2023-12-05 ENCOUNTER — Inpatient Hospital Stay: Payer: Medicare HMO | Admitting: Oncology

## 2023-12-19 DIAGNOSIS — M7061 Trochanteric bursitis, right hip: Secondary | ICD-10-CM | POA: Diagnosis not present

## 2023-12-27 ENCOUNTER — Inpatient Hospital Stay (HOSPITAL_BASED_OUTPATIENT_CLINIC_OR_DEPARTMENT_OTHER): Payer: Medicare HMO | Admitting: Oncology

## 2023-12-27 ENCOUNTER — Encounter: Payer: Self-pay | Admitting: Oncology

## 2023-12-27 ENCOUNTER — Inpatient Hospital Stay: Payer: Medicare HMO | Attending: Oncology

## 2023-12-27 VITALS — BP 146/76 | HR 69 | Temp 98.1°F | Resp 18 | Wt 195.0 lb

## 2023-12-27 DIAGNOSIS — D631 Anemia in chronic kidney disease: Secondary | ICD-10-CM

## 2023-12-27 DIAGNOSIS — Z87891 Personal history of nicotine dependence: Secondary | ICD-10-CM | POA: Diagnosis not present

## 2023-12-27 DIAGNOSIS — Z86711 Personal history of pulmonary embolism: Secondary | ICD-10-CM | POA: Diagnosis not present

## 2023-12-27 DIAGNOSIS — D509 Iron deficiency anemia, unspecified: Secondary | ICD-10-CM

## 2023-12-27 DIAGNOSIS — M25551 Pain in right hip: Secondary | ICD-10-CM | POA: Diagnosis not present

## 2023-12-27 DIAGNOSIS — Z7901 Long term (current) use of anticoagulants: Secondary | ICD-10-CM | POA: Insufficient documentation

## 2023-12-27 DIAGNOSIS — R5383 Other fatigue: Secondary | ICD-10-CM | POA: Insufficient documentation

## 2023-12-27 LAB — CBC
HCT: 42.7 % (ref 36.0–46.0)
Hemoglobin: 14.5 g/dL (ref 12.0–15.0)
MCH: 30.3 pg (ref 26.0–34.0)
MCHC: 34 g/dL (ref 30.0–36.0)
MCV: 89.1 fL (ref 80.0–100.0)
Platelets: 269 10*3/uL (ref 150–400)
RBC: 4.79 MIL/uL (ref 3.87–5.11)
RDW: 13.5 % (ref 11.5–15.5)
WBC: 7.7 10*3/uL (ref 4.0–10.5)
nRBC: 0 % (ref 0.0–0.2)

## 2023-12-27 LAB — FERRITIN: Ferritin: 69 ng/mL (ref 11–307)

## 2023-12-27 LAB — IRON AND TIBC
Iron: 77 ug/dL (ref 28–170)
Saturation Ratios: 22 % (ref 10.4–31.8)
TIBC: 357 ug/dL (ref 250–450)
UIBC: 280 ug/dL

## 2023-12-27 NOTE — Progress Notes (Signed)
 Hematology/Oncology Consult note [facility]  [phone] [phone]  Patient Care Team: Valora Agent, MD as PCP - General (Family Medicine) Melanee Annah BROCKS, MD as Consulting Physician (Oncology)   Name of the patient: Latoya Freeman  [id number]  05-28-1941   Date of visit: 12/27/23  Diagnosis-history of pulmonary embolism on Eliquis  History of iron  deficiency anemia  Chief complaint/ Reason for visit-routine follow-up of iron  deficiency anemia and PE  Heme/Onc history: Patient is a 83 year old female who was seen by me in June 2020.  She was found to have unprovoked PE in December 2019 and has been on Eliquis  since then.  She has now been referred to me for anemia.  Most recent CBC from 07/20/2020 showed H&H of 9.7/29 with an MCV of 83 ferritin levels were low at 12 and iron  saturation low at 14%.  Patient has been following up with Dr. MALISSA for evaluation of celiac artery stenosis for symptoms of chronic mesenteric ischemia she underwent aortogram and selective angiogram of SMA and celiac artery which showed extrinsic compression creating more than 80% stenosis of the celiac artery possibly secondary to internal arcuate ligament.  She was evaluated by vascular surgery at [facility] for possible intervention of this ligament and subsequently underwent surgery with resolution of symptoms     Interval history- Patient is presently at her baseline state of health although she reports ongoing fatigue.  Denies any new complaints at this time.  She was seen by orthopedics for right hip pain for which she is getting joint injections  ECOG PS- 2 Pain scale- 0   Review of systems- Review of Systems  Constitutional:  Negative for chills, fever, malaise/fatigue and weight loss.  HENT:  Negative for congestion, ear discharge and nosebleeds.   Eyes:  Negative for blurred vision.  Respiratory:  Negative for cough, hemoptysis, sputum production, shortness  of breath and wheezing.   Cardiovascular:  Negative for chest pain, palpitations, orthopnea and claudication.  Gastrointestinal:  Negative for abdominal pain, blood in stool, constipation, diarrhea, heartburn, melena, nausea and vomiting.  Genitourinary:  Negative for dysuria, flank pain, frequency, hematuria and urgency.  Musculoskeletal:  Negative for back pain, joint pain and myalgias.  Skin:  Negative for rash.  Neurological:  Negative for dizziness, tingling, focal weakness, seizures, weakness and headaches.  Endo/Heme/Allergies:  Does not bruise/bleed easily.  Psychiatric/Behavioral:  Negative for depression and suicidal ideas. The patient does not have insomnia.       Allergies  Allergen Reactions   Bee Venom Swelling   Shellfish Allergy Swelling   Tamoxifen Hives   Penicillins Hives, Rash and Other (See Comments)    Has patient had a PCN reaction causing immediate rash, facial/tongue/throat swelling, SOB or lightheadedness with hypotension: Unknown Has patient had a PCN reaction causing severe rash involving mucus membranes or skin necrosis: Unknown Has patient had a PCN reaction that required hospitalization: Unknown Has patient had a PCN reaction occurring within the last 10 years: No If all of the above answers are NO, then may proceed with Cephalosporin use.      Past Medical History:  Diagnosis Date   Aneurysm (HCC) 1980   Arthritis    Cancer (HCC)    GERD (gastroesophageal reflux disease)    Hypertension 1980   Hypothyroidism 1999   Pneumonia    Pulmonary embolism (HCC)    Shingles    Wears dentures    full upper and lower     Past Surgical History:  Procedure Laterality Date   BRAIN SURGERY     aneurysm   BREAST CYST ASPIRATION Left 2005   BREAST EXCISIONAL BIOPSY Left 2013   atypical ductal hyperplasia   BREAST SURGERY  2013   LF Breast Wide EXC    CHOLECYSTECTOMY  2004   COLONOSCOPY     COLONOSCOPY WITH PROPOFOL  N/A 01/31/2018   Procedure:  COLONOSCOPY WITH PROPOFOL ;  Surgeon: Gaylyn Gladis PENNER, MD;  Location: [facility] ENDOSCOPY;  Service: Endoscopy;  Laterality: N/A;   COLONOSCOPY WITH PROPOFOL  N/A 08/06/2020   Procedure: COLONOSCOPY WITH PROPOFOL ;  Surgeon: Janalyn Keene NOVAK, MD;  Location: [facility] ENDOSCOPY;  Service: Endoscopy;  Laterality: N/A;   DILATION AND CURETTAGE OF UTERUS N/A 05/03/2019   Procedure: DILATATION AND CURETTAGE;  Surgeon: Arloa Lamar SQUIBB, MD;  Location: [facility] ORS;  Service: Gynecology;  Laterality: N/A;   ESOPHAGOGASTRODUODENOSCOPY N/A 01/31/2018   Procedure: ESOPHAGOGASTRODUODENOSCOPY (EGD);  Surgeon: Gaylyn Gladis PENNER, MD;  Location: [facility] ENDOSCOPY;  Service: Endoscopy;  Laterality: N/A;   ESOPHAGOGASTRODUODENOSCOPY (EGD) WITH PROPOFOL  N/A 08/06/2020   Procedure: ESOPHAGOGASTRODUODENOSCOPY (EGD) WITH PROPOFOL ;  Surgeon: Janalyn Keene NOVAK, MD;  Location: [facility] ENDOSCOPY;  Service: Endoscopy;  Laterality: N/A;   LUMBAR LAMINECTOMY/DECOMPRESSION MICRODISCECTOMY N/A 07/01/2021   Procedure: LEFT L3-4 MICRODSICECTOMY, L4-S1 DECOMPRESSION;  Surgeon: Clois Fret, MD;  Location: [facility] ORS;  Service: Neurosurgery;  Laterality: N/A;   LUMBAR PUNCTURE     SHOULDER ARTHROSCOPY Left 07/25/2018   Procedure: SHOULDER MINI OPEN ROTATOR CUFF REPAIR  BICEPS TENDOSIS ARTHROSCOPIC DISTAL CLAVICLE EXCISION  SUBACROMIAL DECOMP;  Surgeon: Tobie Priest, MD;  Location: [facility];  Service: Orthopedics;  Laterality: Left;  [facility] WITH SPYDER SMITH & NEPHEW HEAD COIL ANCHOR FOOTPRINT ANCHOR QFIX ANCHOR   VISCERAL ANGIOGRAPHY N/A 08/14/2020   Procedure: VISCERAL ANGIOGRAPHY;  Surgeon: Marea Selinda RAMAN, MD;  Location: [facility] INVASIVE CV LAB;  Service: Cardiovascular;  Laterality: N/A;   VISCERAL ANGIOGRAPHY N/A 09/03/2021   Procedure: VISCERAL ANGIOGRAPHY;  Surgeon: Marea Selinda RAMAN, MD;  Location: [facility] INVASIVE CV LAB;  Service: Cardiovascular;  Laterality: N/A;    Social History   Socioeconomic History   Marital  status: Divorced    Spouse name: Not on file   Number of children: Not on file   Years of education: Not on file   Highest education level: Not on file  Occupational History   Not on file  Tobacco Use   Smoking status: Former    Current packs/day: 0.00    Average packs/day: 1.5 packs/day for 20.0 years (30.0 ttl pk-yrs)    Types: Cigarettes    Start date: 42    Quit date: 77    Years since quitting: 35.1   Smokeless tobacco: Never  Vaping Use   Vaping status: Never Used  Substance and Sexual Activity   Alcohol use: No   Drug use: No   Sexual activity: Not on file  Other Topics Concern   Not on file  Social History Narrative   Lives alone   Social Drivers of Health   Financial Resource Strain: Low Risk  (10/25/2023)   Received [facility]   Overall Financial Resource Strain (CARDIA)    Difficulty of Paying Living Expenses: Not hard at all  Food Insecurity: No Food Insecurity (10/25/2023)   Received from [facility]   Hunger Vital Sign    Worried About Running Out of Food in the Last Year: Never true    Ran Out of Food in the Last Year: Never true  Transportation Needs: No Transportation Needs (10/25/2023)   Received from [facility] - Transportation    In the past 12 months, has lack of transportation kept you from medical appointments or from getting medications?: No    Lack of Transportation (Non-Medical): No  Physical Activity: Not on file  Stress: Not on file  Social Connections: Not on file  Intimate Partner Violence: Not on file    Family History  Problem Relation Age of Onset   Breast cancer Neg Hx      Current Outpatient Medications:    acetaminophen  (TYLENOL ) 325 MG tablet, Take 2 tablets (650 mg total) by mouth every 6 (six) hours as needed for mild pain, moderate pain, fever or headache (or Fever >/= 101)., Disp: , Rfl:    apixaban  (ELIQUIS ) 5 MG TABS tablet, Take 1 tablet (5 mg  total) by mouth 2 (two) times daily., Disp: 180 tablet, Rfl: 2   citalopram  (CELEXA ) 20 MG tablet, Take by mouth., Disp: , Rfl:    furosemide (LASIX) 20 MG tablet, Take 20 mg by mouth., Disp: , Rfl:    latanoprost (XALATAN) 0.005 % ophthalmic solution, 1 drop daily., Disp: , Rfl:    levothyroxine  (SYNTHROID , LEVOTHROID) 75 MCG tablet, Take 75 mcg by mouth daily., Disp: , Rfl:    losartan  (COZAAR ) 50 MG tablet, Take 50 mg by mouth daily. , Disp: , Rfl:    nortriptyline (PAMELOR) 10 MG capsule, Take by mouth., Disp: , Rfl:    omeprazole  (PRILOSEC) 40 MG capsule, SMARTSIG:1 Capsule(s) By Mouth Daily, Disp: , Rfl:    senna (SENOKOT) 8.6 MG TABS tablet, Take 1 tablet (8.6 mg total) by mouth 2 (two) times daily., Disp: 120 tablet, Rfl: 0   solifenacin (VESICARE) 5 MG tablet, Take 5 mg by mouth daily., Disp: , Rfl:    sucralfate  (CARAFATE ) 1 GM/10ML suspension, Take 10 mLs (1 g total) by mouth 4 (four) times daily -  with meals and at bedtime., Disp: 828 mL, Rfl: 0   Vonoprazan Fumarate  (VOQUEZNA ) 20 MG TABS, Take 1 tablet by mouth daily., Disp: 30 tablet, Rfl: 5   bisacodyl  (DULCOLAX) 5 MG EC tablet, Take 1 tablet (5 mg total) by mouth daily as needed for moderate constipation. (Patient not taking: Reported on 12/27/2023), Disp: 30 tablet, Rfl: 0   cetirizine (ZYRTEC) 10 MG tablet, Take 10 mg by mouth daily as needed for allergies. (Patient not taking: Reported on 08/10/2023), Disp: , Rfl:    potassium chloride  SA (KLOR-CON ) 20 MEQ tablet, Take 1 tablet (20 mEq total) by mouth 2 (two) times daily. (Patient not taking: Reported on 12/27/2023), Disp: 14 tablet, Rfl: 0  Physical exam:  Vitals:   12/27/23 1425  BP: (!) 146/76  Pulse: 69  Resp: 18  Temp: 98.1 F (36.7 C)  TempSrc: Tympanic  SpO2: 97%  Weight: 195 lb (88.5 kg)   Physical Exam Cardiovascular:     Rate and Rhythm: Normal rate and regular rhythm.     Heart sounds: Normal heart sounds.  Pulmonary:     Effort: Pulmonary effort is normal.      Breath sounds: Normal breath sounds.  Skin:    General: Skin is warm and dry.  Neurological:     Mental Status: She is alert and oriented to person, place, and time.         Latest Ref Rng & Units 03/31/2022   12:46 PM  CMP  Glucose 70 - 99 mg/dL 894   BUN  8 - 23 mg/dL 14   Creatinine 9.55 - 1.00 mg/dL 8.46   Sodium 864 - 854 mmol/L 135   Potassium 3.5 - 5.1 mmol/L 3.4   Chloride 98 - 111 mmol/L 101   CO2 22 - 32 mmol/L 27   Calcium  8.9 - 10.3 mg/dL 89.9   Total Protein 6.5 - 8.1 g/dL 7.4   Total Bilirubin 0.3 - 1.2 mg/dL 0.7   Alkaline Phos 38 - 126 U/L 60   AST 15 - 41 U/L 19   ALT 0 - 44 U/L 15       Latest Ref Rng & Units 12/27/2023    1:55 PM  CBC  WBC 4.0 - 10.5 K/uL 7.7   Hemoglobin 12.0 - 15.0 g/dL 85.4   Hematocrit 63.9 - 46.0 % 42.7   Platelets 150 - 400 K/uL 269     No images are attached to the encounter.  No results found.   Assessment and plan- Patient is a 83 y.o. female who is here for follow-up of following issues:  History of iron  deficiency anemia: Patient has not received any IV ironFor about a year now.  Her hemoglobin is presently normal at 14.5.  Iron  studies from today are pending.  CBC ferritin and iron  studies in 3 and 6 months and I will see her back in 6 months.  History of pulmonary embolism: Currently on Eliquis  which she wishes to continue.  No recent history of bleeding or falls.   Visit Diagnosis 1. Iron  deficiency anemia, unspecified iron  deficiency anemia type   2. Current use of long term anticoagulation      Dr. Annah Skene, MD, MPH [facility] [phone] 12/27/2023 3:26 PM

## 2023-12-27 NOTE — Progress Notes (Signed)
Patient here for oncology follow-up appointment, concerns of SOB with exertion    

## 2024-01-02 DIAGNOSIS — N1832 Chronic kidney disease, stage 3b: Secondary | ICD-10-CM | POA: Diagnosis not present

## 2024-01-02 DIAGNOSIS — I129 Hypertensive chronic kidney disease with stage 1 through stage 4 chronic kidney disease, or unspecified chronic kidney disease: Secondary | ICD-10-CM | POA: Diagnosis not present

## 2024-01-02 DIAGNOSIS — D631 Anemia in chronic kidney disease: Secondary | ICD-10-CM | POA: Diagnosis not present

## 2024-01-02 DIAGNOSIS — R809 Proteinuria, unspecified: Secondary | ICD-10-CM | POA: Diagnosis not present

## 2024-01-02 DIAGNOSIS — G8929 Other chronic pain: Secondary | ICD-10-CM | POA: Diagnosis not present

## 2024-01-02 DIAGNOSIS — R519 Headache, unspecified: Secondary | ICD-10-CM | POA: Diagnosis not present

## 2024-01-02 DIAGNOSIS — N2581 Secondary hyperparathyroidism of renal origin: Secondary | ICD-10-CM | POA: Diagnosis not present

## 2024-01-02 DIAGNOSIS — R42 Dizziness and giddiness: Secondary | ICD-10-CM | POA: Diagnosis not present

## 2024-01-02 DIAGNOSIS — E876 Hypokalemia: Secondary | ICD-10-CM | POA: Diagnosis not present

## 2024-01-02 DIAGNOSIS — Z8661 Personal history of infections of the central nervous system: Secondary | ICD-10-CM | POA: Diagnosis not present

## 2024-01-02 DIAGNOSIS — G479 Sleep disorder, unspecified: Secondary | ICD-10-CM | POA: Diagnosis not present

## 2024-01-02 DIAGNOSIS — M545 Low back pain, unspecified: Secondary | ICD-10-CM | POA: Diagnosis not present

## 2024-01-24 DIAGNOSIS — N2889 Other specified disorders of kidney and ureter: Secondary | ICD-10-CM | POA: Diagnosis not present

## 2024-01-24 DIAGNOSIS — I129 Hypertensive chronic kidney disease with stage 1 through stage 4 chronic kidney disease, or unspecified chronic kidney disease: Secondary | ICD-10-CM | POA: Diagnosis not present

## 2024-01-24 DIAGNOSIS — R809 Proteinuria, unspecified: Secondary | ICD-10-CM | POA: Diagnosis not present

## 2024-01-24 DIAGNOSIS — D631 Anemia in chronic kidney disease: Secondary | ICD-10-CM | POA: Diagnosis not present

## 2024-01-24 DIAGNOSIS — J449 Chronic obstructive pulmonary disease, unspecified: Secondary | ICD-10-CM | POA: Diagnosis not present

## 2024-01-24 DIAGNOSIS — N2581 Secondary hyperparathyroidism of renal origin: Secondary | ICD-10-CM | POA: Diagnosis not present

## 2024-01-24 DIAGNOSIS — N1832 Chronic kidney disease, stage 3b: Secondary | ICD-10-CM | POA: Diagnosis not present

## 2024-01-31 DIAGNOSIS — N2581 Secondary hyperparathyroidism of renal origin: Secondary | ICD-10-CM | POA: Diagnosis not present

## 2024-01-31 DIAGNOSIS — Z6839 Body mass index (BMI) 39.0-39.9, adult: Secondary | ICD-10-CM | POA: Diagnosis not present

## 2024-01-31 DIAGNOSIS — N1832 Chronic kidney disease, stage 3b: Secondary | ICD-10-CM | POA: Diagnosis not present

## 2024-01-31 DIAGNOSIS — D631 Anemia in chronic kidney disease: Secondary | ICD-10-CM | POA: Diagnosis not present

## 2024-01-31 DIAGNOSIS — I129 Hypertensive chronic kidney disease with stage 1 through stage 4 chronic kidney disease, or unspecified chronic kidney disease: Secondary | ICD-10-CM | POA: Diagnosis not present

## 2024-01-31 DIAGNOSIS — E079 Disorder of thyroid, unspecified: Secondary | ICD-10-CM | POA: Diagnosis not present

## 2024-01-31 DIAGNOSIS — K219 Gastro-esophageal reflux disease without esophagitis: Secondary | ICD-10-CM | POA: Diagnosis not present

## 2024-02-18 DIAGNOSIS — N184 Chronic kidney disease, stage 4 (severe): Secondary | ICD-10-CM | POA: Diagnosis not present

## 2024-02-18 DIAGNOSIS — S52591A Other fractures of lower end of right radius, initial encounter for closed fracture: Secondary | ICD-10-CM | POA: Diagnosis not present

## 2024-02-18 DIAGNOSIS — M25531 Pain in right wrist: Secondary | ICD-10-CM | POA: Diagnosis not present

## 2024-02-27 DIAGNOSIS — M25531 Pain in right wrist: Secondary | ICD-10-CM | POA: Diagnosis not present

## 2024-02-27 DIAGNOSIS — S52571A Other intraarticular fracture of lower end of right radius, initial encounter for closed fracture: Secondary | ICD-10-CM | POA: Diagnosis not present

## 2024-02-27 DIAGNOSIS — G8929 Other chronic pain: Secondary | ICD-10-CM | POA: Diagnosis not present

## 2024-03-07 DIAGNOSIS — G479 Sleep disorder, unspecified: Secondary | ICD-10-CM | POA: Diagnosis not present

## 2024-03-07 DIAGNOSIS — G8929 Other chronic pain: Secondary | ICD-10-CM | POA: Diagnosis not present

## 2024-03-07 DIAGNOSIS — R42 Dizziness and giddiness: Secondary | ICD-10-CM | POA: Diagnosis not present

## 2024-03-07 DIAGNOSIS — I951 Orthostatic hypotension: Secondary | ICD-10-CM | POA: Diagnosis not present

## 2024-03-07 DIAGNOSIS — R296 Repeated falls: Secondary | ICD-10-CM | POA: Diagnosis not present

## 2024-03-07 DIAGNOSIS — R519 Headache, unspecified: Secondary | ICD-10-CM | POA: Diagnosis not present

## 2024-03-07 DIAGNOSIS — M545 Low back pain, unspecified: Secondary | ICD-10-CM | POA: Diagnosis not present

## 2024-03-07 DIAGNOSIS — Z8661 Personal history of infections of the central nervous system: Secondary | ICD-10-CM | POA: Diagnosis not present

## 2024-03-26 ENCOUNTER — Other Ambulatory Visit: Payer: Medicare HMO

## 2024-03-27 ENCOUNTER — Inpatient Hospital Stay: Attending: Oncology

## 2024-03-27 DIAGNOSIS — D509 Iron deficiency anemia, unspecified: Secondary | ICD-10-CM | POA: Insufficient documentation

## 2024-03-27 DIAGNOSIS — S52571A Other intraarticular fracture of lower end of right radius, initial encounter for closed fracture: Secondary | ICD-10-CM | POA: Diagnosis not present

## 2024-03-27 DIAGNOSIS — Z7901 Long term (current) use of anticoagulants: Secondary | ICD-10-CM

## 2024-03-27 LAB — FERRITIN: Ferritin: 47 ng/mL (ref 11–307)

## 2024-03-27 LAB — IRON AND TIBC
Iron: 66 ug/dL (ref 28–170)
Saturation Ratios: 19 % (ref 10.4–31.8)
TIBC: 357 ug/dL (ref 250–450)
UIBC: 291 ug/dL

## 2024-03-27 LAB — CBC (CANCER CENTER ONLY)
HCT: 43.6 % (ref 36.0–46.0)
Hemoglobin: 14.7 g/dL (ref 12.0–15.0)
MCH: 29.7 pg (ref 26.0–34.0)
MCHC: 33.7 g/dL (ref 30.0–36.0)
MCV: 88.1 fL (ref 80.0–100.0)
Platelet Count: 274 10*3/uL (ref 150–400)
RBC: 4.95 MIL/uL (ref 3.87–5.11)
RDW: 13.2 % (ref 11.5–15.5)
WBC Count: 6.8 10*3/uL (ref 4.0–10.5)
nRBC: 0 % (ref 0.0–0.2)

## 2024-04-02 DIAGNOSIS — Z0100 Encounter for examination of eyes and vision without abnormal findings: Secondary | ICD-10-CM | POA: Diagnosis not present

## 2024-04-02 DIAGNOSIS — H401112 Primary open-angle glaucoma, right eye, moderate stage: Secondary | ICD-10-CM | POA: Diagnosis not present

## 2024-04-02 DIAGNOSIS — H401123 Primary open-angle glaucoma, left eye, severe stage: Secondary | ICD-10-CM | POA: Diagnosis not present

## 2024-04-02 DIAGNOSIS — H43813 Vitreous degeneration, bilateral: Secondary | ICD-10-CM | POA: Diagnosis not present

## 2024-04-02 DIAGNOSIS — Z961 Presence of intraocular lens: Secondary | ICD-10-CM | POA: Diagnosis not present

## 2024-04-17 ENCOUNTER — Encounter: Payer: Self-pay | Admitting: Gastroenterology

## 2024-04-17 ENCOUNTER — Ambulatory Visit: Admitting: Gastroenterology

## 2024-04-17 VITALS — BP 129/80 | HR 74 | Temp 98.2°F | Ht 61.0 in | Wt 199.8 lb

## 2024-04-17 DIAGNOSIS — I774 Celiac artery compression syndrome: Secondary | ICD-10-CM

## 2024-04-17 DIAGNOSIS — K219 Gastro-esophageal reflux disease without esophagitis: Secondary | ICD-10-CM | POA: Diagnosis not present

## 2024-04-17 DIAGNOSIS — R1013 Epigastric pain: Secondary | ICD-10-CM | POA: Diagnosis not present

## 2024-04-17 DIAGNOSIS — R1010 Upper abdominal pain, unspecified: Secondary | ICD-10-CM

## 2024-04-17 DIAGNOSIS — Z8719 Personal history of other diseases of the digestive system: Secondary | ICD-10-CM | POA: Diagnosis not present

## 2024-04-17 DIAGNOSIS — E538 Deficiency of other specified B group vitamins: Secondary | ICD-10-CM

## 2024-04-17 MED ORDER — FAMOTIDINE 20 MG PO TABS: 20.0000 mg | ORAL_TABLET | Freq: Every day | ORAL | 3 refills | Status: DC

## 2024-04-17 MED ORDER — PANTOPRAZOLE SODIUM 40 MG PO TBEC: 40.0000 mg | DELAYED_RELEASE_TABLET | Freq: Two times a day (BID) | ORAL | 5 refills | Status: DC

## 2024-04-17 NOTE — Patient Instructions (Signed)
 Stop omeprazole . Start pantoprazole  40mg  30 minutes before breakfast and 30 minutes before supper. I have sent in RX. Start famotidine  (Pepcid ) 20mg  at bedtime. I have sent in RX. Allow at least 3 hours between eating/drinking and laying down. Reduce carbonated beverages as these will increase air in your stomach. Reduce caffeine intake, as caffeine will loosen the muscle at the end of your esophagus and allow more reflux/regurgitation. I will look at [facility] records. Further recommendations on additional work up to follow.  It is possible that you are refluxing bile instead of acid. If above changes don't help, we may go back and try carafate  again.

## 2024-04-17 NOTE — Progress Notes (Unsigned)
 GI Office Note    Referring Provider: Lyle San, MD Primary Care Physician:  Lyle San, MD  Primary Gastroenterologist: former patient of Dr. Marnee Sink  Chief Complaint   Chief Complaint  Patient presents with   Gastroesophageal Reflux     History of Present Illness   Latoya Freeman is a 83 y.o. female presenting today to establish care. Her primary GI is no longer practicing outpatient medicine.   Patient reports she has been having a lot of issues with reflux since she had bacterial meningitis in 06/2021, shortly after having back surgery. Prior to meningitis, she would have mostly nocturnal reflux. After her illness, she started having difficulty controlled reflux all day and worsening of night time symptoms. She wonders if long term antibiotics contributed. She tried taking fiber and probiotics for awhile but no improvement. She's tried elevating the head of the bed. She often wakes up with stuff in her throat. Has nausea and vomiting sometimes nocturnally but most mornings. Sometimes to get relief, she will drink baking soda/pepsi could of times during the night to induce regurgitation/vomiting. She often is hoarse. She can taste blood in her mouth with these episodes. No hematemesis. No dysphagia. Her stomach burns a lot. Upper abdomen burns/sore all the time. Eating can make it flare. Not eating can make it flare. Previously voquenza without relief. Zinc given for possible bile reflux and no improvement. She believes she may have tried carafate  years ago. Prilosec provided some relief but not adequate control. Pepto/milk sometimes helps.  She has daily BM. Sometimes stools runny, but can be normal. No melena, brbpr.   07/2020: angiogram of SMA and celiac artery with PTA of celiac artery for celiac stenosis (from extrinsic compression).  07/2020: Median arcuate ligament release with celiac stent placement in [place]. Complicated by bleeding.    Ph/impedence 2023:  control of distal esophageal acid excellent, control of gastric acid excellent, normal absolute number of gastroesophageal reflux events on PPI BID, negative symptoms correlation for all symptoms.  UGI series 09/2021: severe gastroesophageal reflux exending to cervical esophagus.  EGD and colonoscopy for iron  deficiency anemia in September 2021. EGD showed salmon-colored mucosa suggestive of short segment Barrett's, bx neg. Duodenal biopsies negative for celiac, gastric bx with mild chronic gastritis with reactive changes, no h.pylori. Colonoscopy with 2 subcentimeter polyps (tubular adenomas)removed   -colonoscopy: 01/2018 - pandiverticulosis, one 3 mm polyp at hepatic flexure with absent tissue on biopsy, and one 4 mm polyp at splenic flexure with path showing tubular adenoma - EGD: 01/2018 - short-segment Barrett's esophagus without dysplasia, erosive gastritis with minimal chronic gastritis, single gastric polyp with minimal chronic inflammation, normal duodenum "    [facility] - 10/20/2020 2:12 PM EST  DATE OF SERVICE: 10/20/2020 1:46 pm  EXAM: Mesenteric angiogram with vascular stent insertion  Indication: Patient is status post arcuate ligament release with residual high-grade celiac origin stenosis and symptoms of mesenteric insufficiency  Radiologist: Bellavia Procedures performed: 1.  Ultrasound was used to identify and delineate the patency of the right common femoral artery. A sonographic image of the vessel was placed into the patient's file. The ultrasound probe was covered with a sterile cover and sterile gel was utilized. Real time ultrasound utilized for direct visualized access into the vessel. 2. Abdominal aortogram 3. Selective celiac angiogram. 4. Stenting of the celiac artery origin 5. Repeat abdominal aortogram 6. Up size angioplasty celiac artery origin 7. Repeat abdominal angiogram 8. Selective common hepatic angiogram 9. Right common  femoral arteriogram and  StarClose 10. Conscious sedation 79 minutes 11. Fluoroscopy time 23.5 minutes  Technique: Risks, benefits, alternatives of the procedure, expected clinical course, expected clinical results, and rationale for the procedure were discussed with the patient.  All questions answered.  Informed consent was obtained.  The patient was given moderate sedation with IV midazolam  and fentanyl  by a dedicated nurse under my face-to-face time supervision using physiologic monitoring for a total intra-service time of 79 minutes.  Ultrasound was used to identify and delineate the patency of the right common femoral artery. A sonographic image of the vessel was placed into the patient's file. The ultrasound probe was covered with a sterile cover and sterile gel was utilized. Real time ultrasound utilized for direct visualized access into the vessel. Access achieve a 19 gauge single wall needle 5 French sheath placed. Omni flush catheter advanced for abdominal aortogram.  Following delineation of prompt retrograde flow in the gastroduodenal artery which was hypertrophied and presence of a high-grade celiac artery stenosis, intervention was elected for. 3000 units of heparin  was initially administered.  5 French sheath was exchanged for a 6 [place] destination sheath. 5 [place] Mickelson catheter was then utilized to select the patient's celiac artery origin. The celiac artery origin exhibited in approximately 95% stenosis. Renegade high-flow and GT wire were then utilized to advanced into the patient's common hepatic artery. Prior to any further intervention common additional 2000 units of heparin  was administered.  A 0.014 wire was then advanced into the distal hepatic artery. In the lateral projection, road mapping was performed and stenting of the celiac artery origin utilizing a 4.5 mm x 22 mm resolution onyX stent was performed. Following this, repeat abdominal aortogram was performed showing antegrade flow in the  celiac artery origin with lack of appreciable filling of the a hepatic arterial territory suggesting residual retrograde flow in the gastroduodenal artery.  Up size of angioplasty to 6 mm was then performed utilizing a sterling balloon. 6 mm balloon inflated to 6.5 mm dilation. Prolonged angioplasty at the origin was then performed.  Repeat abdominal aortogram was then performed showing prompt antegrade flow to the celiac artery with patent stent. Renegade high-flow was advanced in the common hepatic artery for selective common hepatic angiogram confirming patent antegrade flow in the hepatic arterial territory.  Right common femoral arteriogram and StarClose was then performed.  Impression: 1. Presence of high-grade approximately 95% celiac artery origin stenosis. Significant retrograde flow through the gastroduodenal artery was noted. Status post stenting and dilation to 6.5 mm. Terminus 2. Plan: Findings discussed with the referring vascular surgeon. 150 mg Plavix was given post stent insertion. Further anticoagulation will be per vascular surgery service.   Wt Readings from Last 10 Encounters:  04/17/24 199 lb 12.8 oz (90.6 kg)  12/27/23 195 lb (88.5 kg)  08/10/23 196 lb (88.9 kg)  10/26/22 188 lb 3.2 oz (85.4 kg)  03/31/22 184 lb 14.4 oz (83.9 kg)  01/28/22 186 lb (84.4 kg)  12/17/21 185 lb 9.6 oz (84.2 kg)  10/12/21 183 lb (83 kg)  09/03/21 180 lb (81.6 kg)  08/28/21 181 lb 6.4 oz (82.3 kg)        Medications   Current Outpatient Medications  Medication Sig Dispense Refill   acetaminophen  (TYLENOL ) 325 MG tablet Take 2 tablets (650 mg total) by mouth every 6 (six) hours as needed for mild pain, moderate pain, fever or headache (or Fever >/= 101).     apixaban  (ELIQUIS ) 5 MG TABS tablet Take  1 tablet (5 mg total) by mouth 2 (two) times daily. 180 tablet 2   citalopram  (CELEXA ) 20 MG tablet Take 20 mg by mouth daily.     furosemide (LASIX) 20 MG tablet Take 20 mg by mouth  daily.     latanoprost (XALATAN) 0.005 % ophthalmic solution 1 drop daily.     levothyroxine  (SYNTHROID , LEVOTHROID) 75 MCG tablet Take 75 mcg by mouth daily.     losartan  (COZAAR ) 50 MG tablet Take 50 mg by mouth daily.      nortriptyline (PAMELOR) 10 MG capsule Take 20 mg by mouth at bedtime.     omeprazole  (PRILOSEC) 40 MG capsule Take 40 mg by mouth in the morning and at bedtime. Takes before breakfast and at bedtime     solifenacin (VESICARE) 5 MG tablet Take 5 mg by mouth daily.     No current facility-administered medications for this visit.    Allergies   Allergies as of 04/17/2024 - Review Complete 04/17/2024  Allergen Reaction Noted   Bee venom Swelling 01/31/2018   Shellfish allergy Swelling 01/31/2018   Tamoxifen Hives 07/20/2018   Penicillins Hives, Rash, and Other (See Comments) 12/21/2012    Past Medical History   Past Medical History:  Diagnosis Date   Aneurysm (HCC) 1980   Arthritis    Cancer (HCC)    GERD (gastroesophageal reflux disease)    Hypertension 1980   Hypothyroidism 1999   Pneumonia    Pulmonary embolism (HCC)    Shingles    Wears dentures    full upper and lower    Past Surgical History   Past Surgical History:  Procedure Laterality Date   BRAIN SURGERY     aneurysm   BREAST CYST ASPIRATION Left 2005   BREAST EXCISIONAL BIOPSY Left 2013   atypical ductal hyperplasia   BREAST SURGERY  2013   LF Breast Wide EXC    CHOLECYSTECTOMY  2004   COLONOSCOPY     COLONOSCOPY WITH PROPOFOL  N/A 01/31/2018   Procedure: COLONOSCOPY WITH PROPOFOL ;  Surgeon: Deveron Fly, MD;  Location: [facility] ENDOSCOPY;  Service: Endoscopy;  Laterality: N/A;   COLONOSCOPY WITH PROPOFOL  N/A 08/06/2020   Procedure: COLONOSCOPY WITH PROPOFOL ;  Surgeon: Irby Mannan, MD;  Location: [facility] ENDOSCOPY;  Service: Endoscopy;  Laterality: N/A;   DILATION AND CURETTAGE OF UTERUS N/A 05/03/2019   Procedure: DILATATION AND CURETTAGE;  Surgeon: Alben Alma, MD;   Location: [facility] ORS;  Service: Gynecology;  Laterality: N/A;   ESOPHAGOGASTRODUODENOSCOPY N/A 01/31/2018   Procedure: ESOPHAGOGASTRODUODENOSCOPY (EGD);  Surgeon: Deveron Fly, MD;  Location: [facility] ENDOSCOPY;  Service: Endoscopy;  Laterality: N/A;   ESOPHAGOGASTRODUODENOSCOPY (EGD) WITH PROPOFOL  N/A 08/06/2020   Procedure: ESOPHAGOGASTRODUODENOSCOPY (EGD) WITH PROPOFOL ;  Surgeon: Irby Mannan, MD;  Location: [facility] ENDOSCOPY;  Service: Endoscopy;  Laterality: N/A;   LUMBAR LAMINECTOMY/DECOMPRESSION MICRODISCECTOMY N/A 07/01/2021   Procedure: LEFT L3-4 MICRODSICECTOMY, L4-S1 DECOMPRESSION;  Surgeon: Jodeen Munch, MD;  Location: [facility] ORS;  Service: Neurosurgery;  Laterality: N/A;   LUMBAR PUNCTURE     SHOULDER ARTHROSCOPY Left 07/25/2018   Procedure: SHOULDER MINI OPEN ROTATOR CUFF REPAIR  BICEPS TENDOSIS ARTHROSCOPIC DISTAL CLAVICLE EXCISION  SUBACROMIAL DECOMP;  Surgeon: Lorri Rota, MD;  Location: [facility];  Service: Orthopedics;  Laterality: Left;  [facility] WITH SPYDER SMITH & NEPHEW HEAD COIL ANCHOR FOOTPRINT ANCHOR QFIX ANCHOR   VISCERAL ANGIOGRAPHY N/A 08/14/2020   Procedure: VISCERAL ANGIOGRAPHY;  Surgeon: Celso College, MD;  Location: [facility] INVASIVE CV LAB;  Service: Cardiovascular;  Laterality: N/A;   VISCERAL ANGIOGRAPHY N/A 09/03/2021   Procedure: VISCERAL ANGIOGRAPHY;  Surgeon: Celso College, MD;  Location: [facility] INVASIVE CV LAB;  Service: Cardiovascular;  Laterality: N/A;    Past Family History   Family History  Problem Relation Age of Onset   Other Brother        Lynch syndrome/colon cancer, age <60   Breast cancer Neg Hx     Past Social History   Social History   Socioeconomic History   Marital status: Divorced    Spouse name: Not on file   Number of children: Not on file   Years of education: Not on file   Highest education level: Not on file  Occupational History   Not on file  Tobacco Use   Smoking status: Former    Current  packs/day: 0.00    Average packs/day: 1.5 packs/day for 20.0 years (30.0 ttl pk-yrs)    Types: Cigarettes    Start date: 17    Quit date: 109    Years since quitting: 35.4   Smokeless tobacco: Never  Vaping Use   Vaping status: Never Used  Substance and Sexual Activity   Alcohol use: No   Drug use: No   Sexual activity: Not on file  Other Topics Concern   Not on file  Social History Narrative   Lives alone   Social Drivers of Health   Financial Resource Strain: Low Risk  (02/27/2024)   Received from [facility]   Overall Financial Resource Strain (CARDIA)    Difficulty of Paying Living Expenses: Not hard at all  Food Insecurity: No Food Insecurity (02/27/2024)   Received from [facility]   Hunger Vital Sign    Worried About Running Out of Food in the Last Year: Never true    Ran Out of Food in the Last Year: Never true  Transportation Needs: No Transportation Needs (02/27/2024)   Received from [facility] - Transportation    In the past 12 months, has lack of transportation kept you from medical appointments or from getting medications?: No    Lack of Transportation (Non-Medical): No  Physical Activity: Not on file  Stress: Not on file  Social Connections: Not on file  Intimate Partner Violence: Not on file    Review of Systems   General: Negative for anorexia, weight loss, fever, chills, fatigue, weakness. Eyes: Negative for vision changes.  ENT: Negative for hoarseness, difficulty swallowing , nasal congestion. CV: Negative for chest pain, angina, palpitations, dyspnea on exertion, peripheral edema.  Respiratory: Negative for dyspnea at rest, dyspnea on exertion, cough, sputum, wheezing.  GI: See history of present illness. GU:  Negative for dysuria, hematuria, urinary incontinence, urinary frequency, nocturnal urination.  MS: positive for joint pain, low back pain.  Derm: Negative for rash or itching.   Neuro: Negative for weakness, abnormal sensation, seizure, frequent headaches, memory loss,  confusion.  Psych: Negative for anxiety, depression, suicidal ideation, hallucinations.  Endo: Negative for unusual weight change.  Heme: Negative for bruising or bleeding. Allergy: Negative for rash or hives.  Physical Exam   BP 129/80 (BP Location: Right Arm, Patient Position: Sitting, Cuff Size: Large)   Pulse 74   Temp 98.2 F (36.8 C) (Oral)   Ht 5\' 1"  (1.549 m)   Wt 199 lb 12.8 oz (90.6 kg)   SpO2 98%   BMI 37.75 kg/m    General: Well-nourished, well-developed in no acute distress. Accompanied  by daughter Head: Normocephalic, atraumatic.   Eyes: Conjunctiva pink, no icterus. Mouth: Oropharyngeal mucosa moist and pink  . Neck: Supple without thyromegaly, masses, or lymphadenopathy.  Lungs: Clear to auscultation bilaterally.  Heart: Regular rate and rhythm, no murmurs rubs or gallops.  Abdomen: Bowel sounds are normal, nontender, nondistended, no hepatosplenomegaly or masses,  no abdominal bruits or hernia, no rebound or guarding.   Rectal: not performed Extremities: No lower extremity edema. No clubbing or deformities.  Neuro: Alert and oriented x 4 , grossly normal neurologically.  Skin: Warm and dry, no rash or jaundice.   Psych: Alert and cooperative, normal mood and affect.  Labs    03/2024: iron  66, TIBC 357, fe sat 19, ferritin 47, Hgb 14.7, platelets 274 H.pylori stool antgen negative 2021.  B12 08/2022, 221L  Imaging Studies   No results found.  Assessment/Plan:   GERD/epigastric burning: symptoms poorly controlled, prior extensive evaluation as outlined above. Ph/impedence test 2023 on PPI BID with no significant acid reflux at that time. UGI in 2022 with reflux to the cervical esophagus -stop omeprazole  -start pantoprazole  40mg  before breakfast and supper.  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 EGD 2021 endoscopically had Barrett's but biopsy negative.  -continue PPI      Latoya Freeman. Harles Lied, MHS, PA-C [facility]

## 2024-04-19 DIAGNOSIS — G8929 Other chronic pain: Secondary | ICD-10-CM | POA: Diagnosis not present

## 2024-04-19 DIAGNOSIS — M25531 Pain in right wrist: Secondary | ICD-10-CM | POA: Diagnosis not present

## 2024-04-19 DIAGNOSIS — S52571D Other intraarticular fracture of lower end of right radius, subsequent encounter for closed fracture with routine healing: Secondary | ICD-10-CM | POA: Diagnosis not present

## 2024-04-20 ENCOUNTER — Telehealth: Payer: Self-pay | Admitting: Gastroenterology

## 2024-04-20 ENCOUNTER — Encounter: Payer: Self-pay | Admitting: Gastroenterology

## 2024-04-20 DIAGNOSIS — E538 Deficiency of other specified B group vitamins: Secondary | ICD-10-CM | POA: Insufficient documentation

## 2024-04-20 NOTE — Telephone Encounter (Signed)
 Please let patient/daughter know I reviewed records regarding her median arcuate ligament syndrome surgery. Also reviewed additional available labs.   She appears to have celiac artery stent remaining in place. Depending on how she does with change in reflux medication, we may need to consider imaging.   She had low B12 level in 2021.   Let's have her update labs, CMET, lipase, B12, folate. Dx: abd pain, chronic kidney disease, b12 deficiency.  Need her to call in 2-3 weeks and let me know if any improvement in her symptoms, if not, consider EGD at that time.

## 2024-04-24 ENCOUNTER — Other Ambulatory Visit: Payer: Self-pay

## 2024-04-24 DIAGNOSIS — E538 Deficiency of other specified B group vitamins: Secondary | ICD-10-CM

## 2024-04-24 DIAGNOSIS — N189 Chronic kidney disease, unspecified: Secondary | ICD-10-CM

## 2024-04-24 NOTE — Telephone Encounter (Signed)
 Pt's daughter Louanna Rouse was made aware and verbalized understanding. Labs were ordered and daughter was instructed to have pt complete at her earliest convenience. Daughter verbalized understanding.

## 2024-05-07 DIAGNOSIS — R296 Repeated falls: Secondary | ICD-10-CM | POA: Diagnosis not present

## 2024-05-07 DIAGNOSIS — G479 Sleep disorder, unspecified: Secondary | ICD-10-CM | POA: Diagnosis not present

## 2024-05-07 DIAGNOSIS — Z1331 Encounter for screening for depression: Secondary | ICD-10-CM | POA: Diagnosis not present

## 2024-05-07 DIAGNOSIS — R519 Headache, unspecified: Secondary | ICD-10-CM | POA: Diagnosis not present

## 2024-05-07 DIAGNOSIS — M545 Low back pain, unspecified: Secondary | ICD-10-CM | POA: Diagnosis not present

## 2024-05-07 DIAGNOSIS — I951 Orthostatic hypotension: Secondary | ICD-10-CM | POA: Diagnosis not present

## 2024-05-07 DIAGNOSIS — Z8661 Personal history of infections of the central nervous system: Secondary | ICD-10-CM | POA: Diagnosis not present

## 2024-05-07 DIAGNOSIS — R42 Dizziness and giddiness: Secondary | ICD-10-CM | POA: Diagnosis not present

## 2024-05-07 DIAGNOSIS — G8929 Other chronic pain: Secondary | ICD-10-CM | POA: Diagnosis not present

## 2024-05-09 DIAGNOSIS — M7061 Trochanteric bursitis, right hip: Secondary | ICD-10-CM | POA: Diagnosis not present

## 2024-05-09 DIAGNOSIS — N189 Chronic kidney disease, unspecified: Secondary | ICD-10-CM | POA: Diagnosis not present

## 2024-05-09 DIAGNOSIS — E538 Deficiency of other specified B group vitamins: Secondary | ICD-10-CM | POA: Diagnosis not present

## 2024-05-10 ENCOUNTER — Ambulatory Visit: Payer: Self-pay | Admitting: Gastroenterology

## 2024-05-10 LAB — B12 AND FOLATE PANEL
Folate: 6.3 ng/mL (ref >3.0)
Vitamin B-12: 444 pg/mL (ref 232–1245)

## 2024-05-10 LAB — COMPREHENSIVE METABOLIC PANEL WITH GFR
ALT: 11 IU/L (ref 0–32)
AST: 15 IU/L (ref 0–40)
Albumin: 4 g/dL (ref 3.7–4.7)
Alkaline Phosphatase: 102 IU/L (ref 44–121)
BUN/Creatinine Ratio: 10 — ABNORMAL LOW (ref 12–28)
BUN: 14 mg/dL (ref 8–27)
Bilirubin Total: 0.3 mg/dL (ref 0.0–1.2)
CO2: 22 mmol/L (ref 20–29)
Calcium: 10 mg/dL (ref 8.7–10.3)
Chloride: 102 mmol/L (ref 96–106)
Creatinine, Ser: 1.35 mg/dL — ABNORMAL HIGH (ref 0.57–1.00)
Globulin, Total: 2.7 g/dL (ref 1.5–4.5)
Glucose: 84 mg/dL (ref 70–99)
Potassium: 3.6 mmol/L (ref 3.5–5.2)
Sodium: 141 mmol/L (ref 134–144)
Total Protein: 6.7 g/dL (ref 6.0–8.5)
eGFR: 39 mL/min/{1.73_m2} — ABNORMAL LOW (ref >59)

## 2024-05-10 LAB — LIPASE: Lipase: 43 U/L (ref 14–85)

## 2024-05-17 DIAGNOSIS — M5441 Lumbago with sciatica, right side: Secondary | ICD-10-CM | POA: Diagnosis not present

## 2024-05-28 DIAGNOSIS — M9901 Segmental and somatic dysfunction of cervical region: Secondary | ICD-10-CM | POA: Diagnosis not present

## 2024-05-28 DIAGNOSIS — M9903 Segmental and somatic dysfunction of lumbar region: Secondary | ICD-10-CM | POA: Diagnosis not present

## 2024-05-28 DIAGNOSIS — M62830 Muscle spasm of back: Secondary | ICD-10-CM | POA: Diagnosis not present

## 2024-05-28 DIAGNOSIS — M9902 Segmental and somatic dysfunction of thoracic region: Secondary | ICD-10-CM | POA: Diagnosis not present

## 2024-05-30 DIAGNOSIS — M9903 Segmental and somatic dysfunction of lumbar region: Secondary | ICD-10-CM | POA: Diagnosis not present

## 2024-05-30 DIAGNOSIS — M9902 Segmental and somatic dysfunction of thoracic region: Secondary | ICD-10-CM | POA: Diagnosis not present

## 2024-05-30 DIAGNOSIS — M9901 Segmental and somatic dysfunction of cervical region: Secondary | ICD-10-CM | POA: Diagnosis not present

## 2024-05-30 DIAGNOSIS — M62830 Muscle spasm of back: Secondary | ICD-10-CM | POA: Diagnosis not present

## 2024-06-04 DIAGNOSIS — M62830 Muscle spasm of back: Secondary | ICD-10-CM | POA: Diagnosis not present

## 2024-06-04 DIAGNOSIS — M9902 Segmental and somatic dysfunction of thoracic region: Secondary | ICD-10-CM | POA: Diagnosis not present

## 2024-06-04 DIAGNOSIS — M9903 Segmental and somatic dysfunction of lumbar region: Secondary | ICD-10-CM | POA: Diagnosis not present

## 2024-06-04 DIAGNOSIS — M9901 Segmental and somatic dysfunction of cervical region: Secondary | ICD-10-CM | POA: Diagnosis not present

## 2024-06-06 DIAGNOSIS — M9902 Segmental and somatic dysfunction of thoracic region: Secondary | ICD-10-CM | POA: Diagnosis not present

## 2024-06-06 DIAGNOSIS — M9903 Segmental and somatic dysfunction of lumbar region: Secondary | ICD-10-CM | POA: Diagnosis not present

## 2024-06-06 DIAGNOSIS — M62830 Muscle spasm of back: Secondary | ICD-10-CM | POA: Diagnosis not present

## 2024-06-06 DIAGNOSIS — M9901 Segmental and somatic dysfunction of cervical region: Secondary | ICD-10-CM | POA: Diagnosis not present

## 2024-06-08 DIAGNOSIS — M9901 Segmental and somatic dysfunction of cervical region: Secondary | ICD-10-CM | POA: Diagnosis not present

## 2024-06-08 DIAGNOSIS — M9903 Segmental and somatic dysfunction of lumbar region: Secondary | ICD-10-CM | POA: Diagnosis not present

## 2024-06-08 DIAGNOSIS — M9902 Segmental and somatic dysfunction of thoracic region: Secondary | ICD-10-CM | POA: Diagnosis not present

## 2024-06-08 DIAGNOSIS — M62830 Muscle spasm of back: Secondary | ICD-10-CM | POA: Diagnosis not present

## 2024-06-11 DIAGNOSIS — M9903 Segmental and somatic dysfunction of lumbar region: Secondary | ICD-10-CM | POA: Diagnosis not present

## 2024-06-11 DIAGNOSIS — M62830 Muscle spasm of back: Secondary | ICD-10-CM | POA: Diagnosis not present

## 2024-06-11 DIAGNOSIS — M9901 Segmental and somatic dysfunction of cervical region: Secondary | ICD-10-CM | POA: Diagnosis not present

## 2024-06-11 DIAGNOSIS — M9902 Segmental and somatic dysfunction of thoracic region: Secondary | ICD-10-CM | POA: Diagnosis not present

## 2024-06-12 ENCOUNTER — Ambulatory Visit (INDEPENDENT_AMBULATORY_CARE_PROVIDER_SITE_OTHER): Admitting: Gastroenterology

## 2024-06-12 ENCOUNTER — Encounter: Payer: Self-pay | Admitting: Gastroenterology

## 2024-06-12 VITALS — BP 122/74 | HR 82 | Temp 98.7°F | Ht 61.0 in | Wt 200.6 lb

## 2024-06-12 DIAGNOSIS — R1013 Epigastric pain: Secondary | ICD-10-CM

## 2024-06-12 DIAGNOSIS — Z8719 Personal history of other diseases of the digestive system: Secondary | ICD-10-CM | POA: Diagnosis not present

## 2024-06-12 DIAGNOSIS — K219 Gastro-esophageal reflux disease without esophagitis: Secondary | ICD-10-CM | POA: Diagnosis not present

## 2024-06-12 NOTE — Progress Notes (Signed)
 GI Office Note    Referring Provider: Valora Agent, MD Primary Care Physician:  Valora Agent, MD  Primary Gastroenterologist: Carlin POUR. Cindie, DO   Chief Complaint   Chief Complaint  Patient presents with   Follow-up    Follow up on GERD. Pt states with medication it is good but some days not    History of Present Illness   Latoya Freeman is a 83 y.o. female presenting today for follow up. History of refractory GERD, worse since having bacterial meningitis in 06/2021, shortly after having back surgery. Before that, she typically had nocturnal reflux. She has history of celiac artery stenosis s/p stent placement in 2021.   Today: reflux doing better during the day. Most of her symptoms are at night or notes when she first wakes up. Gets up about twice per week with reflux/heartburn and has to vomit to get relief. She admits that she is going to bed around 9:30 pm and does not get up until 10:00 am. She is taking all of her reflux medications within about 11-12 hours. Likely has something to do with early morning breakthrough symptoms. No dysphagia. No abdominal pain. BMs regular. No melena, brbpr.   Dealing with back and sciatica issues.   Prior history:  07/2020: angiogram of SMA and celiac artery with PTA of celiac artery for celiac stenosis (from extrinsic compression).   07/2020: Median arcuate ligament release with celiac stent placement in [place]. Complicated by bleeding.    Ph/impedence 2023: control of distal esophageal acid excellent, control of gastric acid excellent, normal absolute number of gastroesophageal reflux events on PPI BID, negative symptoms correlation for all symptoms.   UGI series 09/2021: severe gastroesophageal reflux exending to cervical esophagus.   EGD and colonoscopy for iron  deficiency anemia in September 2021. EGD showed salmon-colored mucosa suggestive of short segment Barrett's, bx neg. Duodenal biopsies negative for celiac, gastric bx  with mild chronic gastritis with reactive changes, no h.pylori. Colonoscopy with 2 subcentimeter polyps (tubular adenomas)removed    -colonoscopy: 01/2018 - pandiverticulosis, one 3 mm polyp at hepatic flexure with absent tissue on biopsy, and one 4 mm polyp at splenic flexure with path showing tubular adenoma - EGD: 01/2018 - short-segment Barrett's esophagus without dysplasia, erosive gastritis with minimal chronic gastritis, single gastric polyp with minimal chronic inflammation, normal duodenum       [facility] - 10/20/2020 2:12 PM EST  DATE OF SERVICE: 10/20/2020 1:46 pm  EXAM: Mesenteric angiogram with vascular stent insertion  Indication: Patient is status post arcuate ligament release with residual high-grade celiac origin stenosis and symptoms of mesenteric insufficiency  Radiologist: Bellavia Procedures performed: 1.  Ultrasound was used to identify and delineate the patency of the right common femoral artery. A sonographic image of the vessel was placed into the patient's file. The ultrasound probe was covered with a sterile cover and sterile gel was utilized. Real time ultrasound utilized for direct visualized access into the vessel. 2. Abdominal aortogram 3. Selective celiac angiogram. 4. Stenting of the celiac artery origin 5. Repeat abdominal aortogram 6. Up size angioplasty celiac artery origin 7. Repeat abdominal angiogram 8. Selective common hepatic angiogram 9. Right common femoral arteriogram and Star[employer] 10. Conscious sedation 79 minutes 11. Fluoroscopy time 23.5 minutes  Technique: Risks, benefits, alternatives of the procedure, expected clinical course, expected clinical results, and rationale for the procedure were discussed with the patient.  All questions answered.  Informed consent was obtained.  The patient was given moderate sedation with  IV midazolam  and fentanyl  by a dedicated nurse under my face-to-face time supervision using physiologic monitoring  for a total intra-service time of 79 minutes.  Ultrasound was used to identify and delineate the patency of the right common femoral artery. A sonographic image of the vessel was placed into the patient's file. The ultrasound probe was covered with a sterile cover and sterile gel was utilized. Real time ultrasound utilized for direct visualized access into the vessel. Access achieve a 19 gauge single wall needle 5 French sheath placed. Omni flush catheter advanced for abdominal aortogram.  Following delineation of prompt retrograde flow in the gastroduodenal artery which was hypertrophied and presence of a high-grade celiac artery stenosis, intervention was elected for. 3000 units of heparin  was initially administered.  5 French sheath was exchanged for a 6 [place] destination sheath. 5 [place] Mickelson catheter was then utilized to select the patient's celiac artery origin. The celiac artery origin exhibited in approximately 95% stenosis. Renegade high-flow and GT wire were then utilized to advanced into the patient's common hepatic artery. Prior to any further intervention common additional 2000 units of heparin  was administered.  A 0.014 wire was then advanced into the distal hepatic artery. In the lateral projection, road mapping was performed and stenting of the celiac artery origin utilizing a 4.5 mm x 22 mm resolution onyX stent was performed. Following this, repeat abdominal aortogram was performed showing antegrade flow in the celiac artery origin with lack of appreciable filling of the a hepatic arterial territory suggesting residual retrograde flow in the gastroduodenal artery.  Up size of angioplasty to 6 mm was then performed utilizing a sterling balloon. 6 mm balloon inflated to 6.5 mm dilation. Prolonged angioplasty at the origin was then performed.  Repeat abdominal aortogram was then performed showing prompt antegrade flow to the celiac artery with patent stent. Renegade high-flow was  advanced in the common hepatic artery for selective common hepatic angiogram confirming patent antegrade flow in the hepatic arterial territory.  Right common femoral arteriogram and StarClose was then performed.  Impression: 1. Presence of high-grade approximately 95% celiac artery origin stenosis. Significant retrograde flow through the gastroduodenal artery was noted. Status post stenting and dilation to 6.5 mm. Terminus 2. Plan: Findings discussed with the referring vascular surgeon. 150 mg Plavix was given post stent insertion. Further anticoagulation will be per vascular surgery service.      Medications   Current Outpatient Medications  Medication Sig Dispense Refill   acetaminophen  (TYLENOL ) 325 MG tablet Take 2 tablets (650 mg total) by mouth every 6 (six) hours as needed for mild pain, moderate pain, fever or headache (or Fever >/= 101).     apixaban  (ELIQUIS ) 5 MG TABS tablet Take 1 tablet (5 mg total) by mouth 2 (two) times daily. 180 tablet 2   citalopram  (CELEXA ) 20 MG tablet Take 20 mg by mouth daily.     famotidine  (PEPCID ) 20 MG tablet Take 1 tablet (20 mg total) by mouth at bedtime. 30 tablet 3   furosemide (LASIX) 20 MG tablet Take 20 mg by mouth daily.     latanoprost (XALATAN) 0.005 % ophthalmic solution 1 drop daily.     levothyroxine  (SYNTHROID , LEVOTHROID) 75 MCG tablet Take 75 mcg by mouth daily.     losartan  (COZAAR ) 50 MG tablet Take 50 mg by mouth daily.      nortriptyline (PAMELOR) 10 MG capsule Take 20 mg by mouth at bedtime.     pantoprazole  (PROTONIX ) 40 MG tablet Take 1 tablet (  40 mg total) by mouth 2 (two) times daily before a meal. 60 tablet 5   solifenacin (VESICARE) 5 MG tablet Take 5 mg by mouth daily.     No current facility-administered medications for this visit.    Allergies   Allergies as of 06/12/2024 - Review Complete 06/12/2024  Allergen Reaction Noted   Bee venom Swelling 01/31/2018   Shellfish allergy Swelling 01/31/2018   Tamoxifen  Hives 07/20/2018   Penicillins Hives, Rash, and Other (See Comments) 12/21/2012      Review of Systems   General: Negative for anorexia, weight loss, fever, chills, fatigue, +weakness. ENT: Negative for hoarseness, difficulty swallowing , nasal congestion. CV: Negative for chest pain, angina, palpitations, dyspnea on exertion, peripheral edema.  Respiratory: Negative for dyspnea at rest, dyspnea on exertion, cough, sputum, wheezing.  GI: See history of present illness. GU:  Negative for dysuria, hematuria, urinary incontinence, urinary frequency, nocturnal urination.  Endo: Negative for unusual weight change.     Physical Exam   BP 122/74   Pulse 82   Temp 98.7 F (37.1 C)   Ht 5' 1 (1.549 m)   Wt 200 lb 9.6 oz (91 kg)   BMI 37.90 kg/m    General: Well-nourished, well-developed in no acute distress. Daughter present Eyes: No icterus.   Lungs: Clear to auscultation bilaterally.  Heart: Regular rate and rhythm, no murmurs rubs or gallops.  Abdomen: Bowel sounds are normal, nontender, nondistended, no hepatosplenomegaly or masses,  no abdominal bruits or hernia , no rebound or guarding.  Rectal: not performed Extremities: No lower extremity edema. No clubbing or deformities. Neuro: Alert and oriented x 4   Skin: Warm and dry, no jaundice.   Psych: Alert and cooperative, normal mood and affect.  Labs   Lab Results  Component Value Date   VITAMINB12 444 05/09/2024   Lab Results  Component Value Date   FOLATE 6.3 05/09/2024   .[place] Lab Results  Component Value Date   WBC 6.8 03/27/2024   HGB 14.7 03/27/2024   HCT 43.6 03/27/2024   MCV 88.1 03/27/2024   PLT 274 03/27/2024   Lab Results  Component Value Date   NA 141 05/09/2024   CL 102 05/09/2024   K 3.6 05/09/2024   CO2 22 05/09/2024   BUN 14 05/09/2024   CREATININE 1.35 (H) 05/09/2024   EGFR 39 (L) 05/09/2024   CALCIUM  10.0 05/09/2024   ALBUMIN 4.0 05/09/2024   GLUCOSE 84 05/09/2024   Lab Results   Component Value Date   ALT 11 05/09/2024   AST 15 05/09/2024   ALKPHOS 102 05/09/2024   BILITOT 0.3 05/09/2024    Imaging Studies   No results found.  Assessment/Plan:     GERD/epigastric burning: Prior extensive evaluation as outlined above. Ph/impedence test 2023 on PPI BID with no significant acid reflux at that time. UGI in 2022 with reflux to the cervical esophagus. Switched from omeprazole  to pantoprazole  BID and added famotidine  20mg  at bedtime after last ov. Overall some improvement. No abdominal pain. Nocturnal symptoms about twice per week. Would potentially do better if she narrowed the window between her night time famotidine  and AM pantoprazole .  -patient will try to go only 8 hours between nighttime famotidine  and am pantoprazole . -reinforced anti-reflux measures  -consider Reflux Gourmet before bedtime, can use after meals if needed but since her symptoms are mostly nocturnal, would consider starting with dose at bedtime after taking her medications. -if is possible that symptoms are not due to acid,  reflux could be non-acidic, could consider carafate  if above measures do not work -if no improvement, consider EGD  History of median arcuate ligament syndrome with celiac artery stenosis s/p surgical release of ligament and celiac artery stent:  -low threshold for updated CTA (if renal function allows), if having weight loss, abdominal pain -clinically no signs of mesenteric ischemia   H/O Barrett's esophagus: EGD 2019 with barrett's without dysplasia. EGD 2021 endoscopically had Barrett's but biopsy negative.  -continue PPI       Sonny RAMAN. Ezzard, MHS, PA-C [facility]

## 2024-06-12 NOTE — Patient Instructions (Addendum)
 Continue pantoprazole  40mg  twice daily before first and last meal of day.  Continue famotidine  20mg  at bedtime.  Try to either stay up later or get up earlier so that you can spread your reflux medication out over the day more.   You can try adding Reflux Gourmet Rescue at night time to help reduce reflux during the night. You can use 1 teaspoon after meals and at bedtime to create a natural barrier/prevent reflux into esophagus. You can buy on [employer] or at [email or web address]  Please reach out if symptoms do not improve or you develop any alarm symptoms such as abdominal pain, weight loss, swallowing issues.

## 2024-06-18 DIAGNOSIS — M62830 Muscle spasm of back: Secondary | ICD-10-CM | POA: Diagnosis not present

## 2024-06-18 DIAGNOSIS — M9903 Segmental and somatic dysfunction of lumbar region: Secondary | ICD-10-CM | POA: Diagnosis not present

## 2024-06-18 DIAGNOSIS — M9901 Segmental and somatic dysfunction of cervical region: Secondary | ICD-10-CM | POA: Diagnosis not present

## 2024-06-18 DIAGNOSIS — M9902 Segmental and somatic dysfunction of thoracic region: Secondary | ICD-10-CM | POA: Diagnosis not present

## 2024-06-20 DIAGNOSIS — M9902 Segmental and somatic dysfunction of thoracic region: Secondary | ICD-10-CM | POA: Diagnosis not present

## 2024-06-20 DIAGNOSIS — M9901 Segmental and somatic dysfunction of cervical region: Secondary | ICD-10-CM | POA: Diagnosis not present

## 2024-06-20 DIAGNOSIS — M62830 Muscle spasm of back: Secondary | ICD-10-CM | POA: Diagnosis not present

## 2024-06-20 DIAGNOSIS — M9903 Segmental and somatic dysfunction of lumbar region: Secondary | ICD-10-CM | POA: Diagnosis not present

## 2024-06-22 ENCOUNTER — Other Ambulatory Visit: Payer: Self-pay

## 2024-06-22 DIAGNOSIS — D509 Iron deficiency anemia, unspecified: Secondary | ICD-10-CM

## 2024-06-25 ENCOUNTER — Inpatient Hospital Stay: Payer: Medicare HMO | Admitting: Nurse Practitioner

## 2024-06-25 ENCOUNTER — Inpatient Hospital Stay: Payer: Medicare HMO

## 2024-06-25 DIAGNOSIS — M9902 Segmental and somatic dysfunction of thoracic region: Secondary | ICD-10-CM | POA: Diagnosis not present

## 2024-06-25 DIAGNOSIS — M9903 Segmental and somatic dysfunction of lumbar region: Secondary | ICD-10-CM | POA: Diagnosis not present

## 2024-06-25 DIAGNOSIS — M62830 Muscle spasm of back: Secondary | ICD-10-CM | POA: Diagnosis not present

## 2024-06-25 DIAGNOSIS — M9901 Segmental and somatic dysfunction of cervical region: Secondary | ICD-10-CM | POA: Diagnosis not present

## 2024-06-27 DIAGNOSIS — M62830 Muscle spasm of back: Secondary | ICD-10-CM | POA: Diagnosis not present

## 2024-06-27 DIAGNOSIS — M9903 Segmental and somatic dysfunction of lumbar region: Secondary | ICD-10-CM | POA: Diagnosis not present

## 2024-06-27 DIAGNOSIS — M9901 Segmental and somatic dysfunction of cervical region: Secondary | ICD-10-CM | POA: Diagnosis not present

## 2024-06-27 DIAGNOSIS — M9902 Segmental and somatic dysfunction of thoracic region: Secondary | ICD-10-CM | POA: Diagnosis not present

## 2024-07-02 DIAGNOSIS — M9902 Segmental and somatic dysfunction of thoracic region: Secondary | ICD-10-CM | POA: Diagnosis not present

## 2024-07-02 DIAGNOSIS — M9903 Segmental and somatic dysfunction of lumbar region: Secondary | ICD-10-CM | POA: Diagnosis not present

## 2024-07-02 DIAGNOSIS — M9901 Segmental and somatic dysfunction of cervical region: Secondary | ICD-10-CM | POA: Diagnosis not present

## 2024-07-02 DIAGNOSIS — M62830 Muscle spasm of back: Secondary | ICD-10-CM | POA: Diagnosis not present

## 2024-07-04 DIAGNOSIS — M9903 Segmental and somatic dysfunction of lumbar region: Secondary | ICD-10-CM | POA: Diagnosis not present

## 2024-07-04 DIAGNOSIS — M62830 Muscle spasm of back: Secondary | ICD-10-CM | POA: Diagnosis not present

## 2024-07-04 DIAGNOSIS — M9902 Segmental and somatic dysfunction of thoracic region: Secondary | ICD-10-CM | POA: Diagnosis not present

## 2024-07-04 DIAGNOSIS — M9901 Segmental and somatic dysfunction of cervical region: Secondary | ICD-10-CM | POA: Diagnosis not present

## 2024-07-09 DIAGNOSIS — M9902 Segmental and somatic dysfunction of thoracic region: Secondary | ICD-10-CM | POA: Diagnosis not present

## 2024-07-09 DIAGNOSIS — M62830 Muscle spasm of back: Secondary | ICD-10-CM | POA: Diagnosis not present

## 2024-07-09 DIAGNOSIS — M9901 Segmental and somatic dysfunction of cervical region: Secondary | ICD-10-CM | POA: Diagnosis not present

## 2024-07-09 DIAGNOSIS — M9903 Segmental and somatic dysfunction of lumbar region: Secondary | ICD-10-CM | POA: Diagnosis not present

## 2024-07-11 DIAGNOSIS — M9902 Segmental and somatic dysfunction of thoracic region: Secondary | ICD-10-CM | POA: Diagnosis not present

## 2024-07-11 DIAGNOSIS — M62830 Muscle spasm of back: Secondary | ICD-10-CM | POA: Diagnosis not present

## 2024-07-11 DIAGNOSIS — M9901 Segmental and somatic dysfunction of cervical region: Secondary | ICD-10-CM | POA: Diagnosis not present

## 2024-07-11 DIAGNOSIS — M9903 Segmental and somatic dysfunction of lumbar region: Secondary | ICD-10-CM | POA: Diagnosis not present

## 2024-07-16 DIAGNOSIS — R296 Repeated falls: Secondary | ICD-10-CM | POA: Diagnosis not present

## 2024-07-16 DIAGNOSIS — R519 Headache, unspecified: Secondary | ICD-10-CM | POA: Diagnosis not present

## 2024-07-16 DIAGNOSIS — M9901 Segmental and somatic dysfunction of cervical region: Secondary | ICD-10-CM | POA: Diagnosis not present

## 2024-07-16 DIAGNOSIS — M545 Low back pain, unspecified: Secondary | ICD-10-CM | POA: Diagnosis not present

## 2024-07-16 DIAGNOSIS — M9903 Segmental and somatic dysfunction of lumbar region: Secondary | ICD-10-CM | POA: Diagnosis not present

## 2024-07-16 DIAGNOSIS — G479 Sleep disorder, unspecified: Secondary | ICD-10-CM | POA: Diagnosis not present

## 2024-07-16 DIAGNOSIS — G8929 Other chronic pain: Secondary | ICD-10-CM | POA: Diagnosis not present

## 2024-07-16 DIAGNOSIS — Z8661 Personal history of infections of the central nervous system: Secondary | ICD-10-CM | POA: Diagnosis not present

## 2024-07-16 DIAGNOSIS — M9902 Segmental and somatic dysfunction of thoracic region: Secondary | ICD-10-CM | POA: Diagnosis not present

## 2024-07-16 DIAGNOSIS — M62830 Muscle spasm of back: Secondary | ICD-10-CM | POA: Diagnosis not present

## 2024-07-16 DIAGNOSIS — R42 Dizziness and giddiness: Secondary | ICD-10-CM | POA: Diagnosis not present

## 2024-07-17 ENCOUNTER — Telehealth: Payer: Self-pay

## 2024-07-17 MED ORDER — FAMOTIDINE 20 MG PO TABS: 20.0000 mg | ORAL_TABLET | Freq: Every day | ORAL | 11 refills | Status: DC

## 2024-07-17 NOTE — Telephone Encounter (Signed)
 Fax was received from [facility] [place] for refills on famotidine  20 mg tab.

## 2024-07-17 NOTE — Addendum Note (Signed)
 Addended by: EZZARD SONNY RAMAN on: 07/17/2024 10:58 PM   Modules accepted: Orders

## 2024-07-18 DIAGNOSIS — M9903 Segmental and somatic dysfunction of lumbar region: Secondary | ICD-10-CM | POA: Diagnosis not present

## 2024-07-18 DIAGNOSIS — M9902 Segmental and somatic dysfunction of thoracic region: Secondary | ICD-10-CM | POA: Diagnosis not present

## 2024-07-18 DIAGNOSIS — M62830 Muscle spasm of back: Secondary | ICD-10-CM | POA: Diagnosis not present

## 2024-07-18 DIAGNOSIS — M9901 Segmental and somatic dysfunction of cervical region: Secondary | ICD-10-CM | POA: Diagnosis not present

## 2024-07-24 DIAGNOSIS — M9901 Segmental and somatic dysfunction of cervical region: Secondary | ICD-10-CM | POA: Diagnosis not present

## 2024-07-24 DIAGNOSIS — M62830 Muscle spasm of back: Secondary | ICD-10-CM | POA: Diagnosis not present

## 2024-07-24 DIAGNOSIS — M9902 Segmental and somatic dysfunction of thoracic region: Secondary | ICD-10-CM | POA: Diagnosis not present

## 2024-07-24 DIAGNOSIS — M9903 Segmental and somatic dysfunction of lumbar region: Secondary | ICD-10-CM | POA: Diagnosis not present

## 2024-07-25 DIAGNOSIS — M9903 Segmental and somatic dysfunction of lumbar region: Secondary | ICD-10-CM | POA: Diagnosis not present

## 2024-07-25 DIAGNOSIS — M62830 Muscle spasm of back: Secondary | ICD-10-CM | POA: Diagnosis not present

## 2024-07-25 DIAGNOSIS — M9901 Segmental and somatic dysfunction of cervical region: Secondary | ICD-10-CM | POA: Diagnosis not present

## 2024-07-25 DIAGNOSIS — M9902 Segmental and somatic dysfunction of thoracic region: Secondary | ICD-10-CM | POA: Diagnosis not present

## 2024-07-30 DIAGNOSIS — M9903 Segmental and somatic dysfunction of lumbar region: Secondary | ICD-10-CM | POA: Diagnosis not present

## 2024-07-30 DIAGNOSIS — M62830 Muscle spasm of back: Secondary | ICD-10-CM | POA: Diagnosis not present

## 2024-07-30 DIAGNOSIS — M9901 Segmental and somatic dysfunction of cervical region: Secondary | ICD-10-CM | POA: Diagnosis not present

## 2024-07-30 DIAGNOSIS — M9902 Segmental and somatic dysfunction of thoracic region: Secondary | ICD-10-CM | POA: Diagnosis not present

## 2024-07-31 DIAGNOSIS — I10 Essential (primary) hypertension: Secondary | ICD-10-CM | POA: Diagnosis not present

## 2024-07-31 DIAGNOSIS — E079 Disorder of thyroid, unspecified: Secondary | ICD-10-CM | POA: Diagnosis not present

## 2024-08-06 DIAGNOSIS — M9902 Segmental and somatic dysfunction of thoracic region: Secondary | ICD-10-CM | POA: Diagnosis not present

## 2024-08-06 DIAGNOSIS — M62830 Muscle spasm of back: Secondary | ICD-10-CM | POA: Diagnosis not present

## 2024-08-06 DIAGNOSIS — M9901 Segmental and somatic dysfunction of cervical region: Secondary | ICD-10-CM | POA: Diagnosis not present

## 2024-08-06 DIAGNOSIS — M9903 Segmental and somatic dysfunction of lumbar region: Secondary | ICD-10-CM | POA: Diagnosis not present

## 2024-08-06 NOTE — Telephone Encounter (Signed)
 Pt and daughter called stating that she is vomiting every morning and has bad indigestion in the mornings. Pt is wanting to move forward with the next steps that were discussed at her last ov. Please advise.

## 2024-08-07 DIAGNOSIS — Z1331 Encounter for screening for depression: Secondary | ICD-10-CM | POA: Diagnosis not present

## 2024-08-07 DIAGNOSIS — E079 Disorder of thyroid, unspecified: Secondary | ICD-10-CM | POA: Diagnosis not present

## 2024-08-07 DIAGNOSIS — I10 Essential (primary) hypertension: Secondary | ICD-10-CM | POA: Diagnosis not present

## 2024-08-07 DIAGNOSIS — Z6839 Body mass index (BMI) 39.0-39.9, adult: Secondary | ICD-10-CM | POA: Diagnosis not present

## 2024-08-07 DIAGNOSIS — E669 Obesity, unspecified: Secondary | ICD-10-CM | POA: Diagnosis not present

## 2024-08-07 DIAGNOSIS — Z0000 Encounter for general adult medical examination without abnormal findings: Secondary | ICD-10-CM | POA: Diagnosis not present

## 2024-08-07 DIAGNOSIS — R0602 Shortness of breath: Secondary | ICD-10-CM | POA: Diagnosis not present

## 2024-08-08 DIAGNOSIS — M9902 Segmental and somatic dysfunction of thoracic region: Secondary | ICD-10-CM | POA: Diagnosis not present

## 2024-08-08 DIAGNOSIS — M62830 Muscle spasm of back: Secondary | ICD-10-CM | POA: Diagnosis not present

## 2024-08-08 DIAGNOSIS — M9901 Segmental and somatic dysfunction of cervical region: Secondary | ICD-10-CM | POA: Diagnosis not present

## 2024-08-08 DIAGNOSIS — M9903 Segmental and somatic dysfunction of lumbar region: Secondary | ICD-10-CM | POA: Diagnosis not present

## 2024-08-08 NOTE — Telephone Encounter (Signed)
 Pt's daughter called back to check on this. States that something is needing to be done, pt is still vomiting every morning.

## 2024-08-09 ENCOUNTER — Telehealth: Payer: Self-pay | Admitting: *Deleted

## 2024-08-09 DIAGNOSIS — E079 Disorder of thyroid, unspecified: Secondary | ICD-10-CM | POA: Diagnosis not present

## 2024-08-09 DIAGNOSIS — R0602 Shortness of breath: Secondary | ICD-10-CM | POA: Diagnosis not present

## 2024-08-09 DIAGNOSIS — Z6839 Body mass index (BMI) 39.0-39.9, adult: Secondary | ICD-10-CM | POA: Diagnosis not present

## 2024-08-09 DIAGNOSIS — I10 Essential (primary) hypertension: Secondary | ICD-10-CM | POA: Diagnosis not present

## 2024-08-09 DIAGNOSIS — Z0000 Encounter for general adult medical examination without abnormal findings: Secondary | ICD-10-CM | POA: Diagnosis not present

## 2024-08-09 DIAGNOSIS — Z1331 Encounter for screening for depression: Secondary | ICD-10-CM | POA: Diagnosis not present

## 2024-08-09 DIAGNOSIS — E669 Obesity, unspecified: Secondary | ICD-10-CM | POA: Diagnosis not present

## 2024-08-09 NOTE — Telephone Encounter (Signed)
 Called and spoke with pt's daughter Holli (on dpr) to schedule pt for procedure. She is concerned about her mom being put to sleep. She says pt's PCP called today and said that pt's lung function was only at 40%. He has started her on an inhaler x 4 weeks to see if he can get her breathing better than she is breathing now. Please advise. Thank you

## 2024-08-09 NOTE — Telephone Encounter (Signed)
  Request for patient to stop medication prior to procedure or is needing cleareance  08/09/24  Latoya Freeman [birth year]  What type of surgery is being performed? Esophagogastroduodenoscopy (EGD)  When is surgery scheduled? TBD  What type of clearance is required (medical or pharmacy to hold medication or both? medication  Are there any medications that need to be held prior to surgery and how long? Eliquis  x 2 days  Name of physician performing surgery?  Dr.Carver [facility] [employer]: [phone], option 5 Fax: [phone]  Anesthesia type (none, local, MAC, general)? MAC   ? Yes ? No Patient can hold medication as requested   Signature: ___________________________

## 2024-08-09 NOTE — Telephone Encounter (Signed)
 Noted. Move forward with EGD.

## 2024-08-09 NOTE — Telephone Encounter (Signed)
 Ok to schedule per previous orders.

## 2024-08-09 NOTE — Telephone Encounter (Signed)
 Please schedule patient ov with Dr. Cindie only so he can reassess this complex patient. Please offer her a spot next week on his schedule. He has urgents available.

## 2024-08-09 NOTE — Telephone Encounter (Signed)
 Tammy, I am not sure that I received the initial mychart message from the patient on 9/12, I would have received it Monday since she sent in Friday evening. And patient mychart messages are nonurgent. I wish when they call in, it could be documented in telephone note so it would then flag in the basket that we respond in 24 hours instead.  We had discussed EGD at last OV and CTA to look at bowel blood flow (only if weight loss and abdominal pain). Is she having any abdominal pain, especially after meals? If not, then I would advise EGD at this time.  EGD with dr. Cindie. ASA 3. Room 3.  She will need to hold eliquis  48 hours before (can we get approval from prescribing provider)  Please ask if she tried the reflux gourmet

## 2024-08-09 NOTE — Telephone Encounter (Signed)
 Clearance sent

## 2024-08-09 NOTE — Telephone Encounter (Signed)
 Daughter states that she is not having abd pain but that she is getting full after eating quicker than normal. She is willing to have EGD and is ready to move forward with scheduling.

## 2024-08-09 NOTE — Telephone Encounter (Signed)
 Yes ok to proceed with egd. Hold eliquis  2 days prior and start night after procedure

## 2024-08-10 NOTE — Telephone Encounter (Signed)
 noted

## 2024-08-10 NOTE — Telephone Encounter (Signed)
 Can you please schedule pt for an OV?  Thank you

## 2024-08-10 NOTE — Telephone Encounter (Signed)
 Please TE for 08/09/24

## 2024-08-14 DIAGNOSIS — M62830 Muscle spasm of back: Secondary | ICD-10-CM | POA: Diagnosis not present

## 2024-08-14 DIAGNOSIS — M9902 Segmental and somatic dysfunction of thoracic region: Secondary | ICD-10-CM | POA: Diagnosis not present

## 2024-08-14 DIAGNOSIS — M9901 Segmental and somatic dysfunction of cervical region: Secondary | ICD-10-CM | POA: Diagnosis not present

## 2024-08-14 DIAGNOSIS — M9903 Segmental and somatic dysfunction of lumbar region: Secondary | ICD-10-CM | POA: Diagnosis not present

## 2024-08-15 ENCOUNTER — Encounter: Payer: Self-pay | Admitting: Internal Medicine

## 2024-08-15 ENCOUNTER — Other Ambulatory Visit: Payer: Self-pay | Admitting: Oncology

## 2024-08-15 ENCOUNTER — Ambulatory Visit (INDEPENDENT_AMBULATORY_CARE_PROVIDER_SITE_OTHER): Admitting: Internal Medicine

## 2024-08-15 VITALS — BP 163/64 | HR 78 | Temp 98.6°F | Ht 60.0 in | Wt 203.9 lb

## 2024-08-15 DIAGNOSIS — Z7901 Long term (current) use of anticoagulants: Secondary | ICD-10-CM

## 2024-08-15 DIAGNOSIS — Z860101 Personal history of adenomatous and serrated colon polyps: Secondary | ICD-10-CM | POA: Diagnosis not present

## 2024-08-15 DIAGNOSIS — K219 Gastro-esophageal reflux disease without esophagitis: Secondary | ICD-10-CM

## 2024-08-15 DIAGNOSIS — R1319 Other dysphagia: Secondary | ICD-10-CM | POA: Diagnosis not present

## 2024-08-15 NOTE — Patient Instructions (Signed)
 We we will schedule you for upper endoscopy to further evaluate your symptoms.  I may elect to stretch your esophagus depending on findings.  I want you to take your pantoprazole  twice daily every day regardless if you are having symptoms or not.  It was very nice meeting both you today.  Dr. Cindie

## 2024-08-15 NOTE — H&P (View-Only) (Signed)
 Referring Provider: Valora Agent, MD Primary Care Physician:  Valora Agent, MD Primary GI:  Dr. Cindie  Chief Complaint  Patient presents with   Follow-up    Patient here today in need for Egd, but patient has low pulmonary function and they are here to discuss this and the need of an egd. Patient is having issues with burning sensation daily, and causes nausea and vomiting. Patient says she usually drinks milk during these episodes and this helps with the burning. Patient has been using pepto prn. She is supposed to be on pantoprazole  40 mg bid, and famotidine  20 mg at bedtime, but patient says she does not take this as the milk seems to help.     HPI:   Latoya Freeman is a 83 y.o. female who presents to clinic today for acute visit for worsening reflux.  Reports reflux relatively well-controlled during the day, but at night her symptoms worsens.  Will wake up about twice per week with reflux and heartburn and has to vomit green stuff to get relief.  Supposed to be taking pantoprazole  twice daily.  Patient admits that she only takes this medication reactively if she is having symptoms.  Her daughter confirms this.  Also reports esophageal dysphagia.  Food will get stuck in her substernal region at times.  Occasional regurgitation.  States she has to eat her meals very slowly.  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   07/2020: Median arcuate ligament release with celiac stent placement in [place]. Complicated by bleeding.    Ph/impedence 2023: control of distal esophageal acid excellent, control of gastric acid excellent, normal absolute number of gastroesophageal reflux events on PPI BID, negative symptoms correlation for all symptoms.   UGI series 09/2021: severe gastroesophageal reflux exending to cervical esophagus.   EGD and colonoscopy for iron  deficiency anemia in September  2021. EGD showed salmon-colored mucosa suggestive of short segment Barrett's, bx neg. Duodenal biopsies negative for celiac, gastric bx with mild chronic gastritis with reactive changes, no h.pylori. Colonoscopy with 2 subcentimeter polyps (tubular adenomas)removed    -colonoscopy: 01/2018 - pandiverticulosis, one 3 mm polyp at hepatic flexure with absent tissue on biopsy, and one 4 mm polyp at splenic flexure with path showing tubular adenoma - EGD: 01/2018 - short-segment Barrett's esophagus without dysplasia, erosive gastritis with minimal chronic gastritis, single gastric polyp with minimal chronic inflammation, normal duodenum    Past Medical History:  Diagnosis Date   Aneurysm 1980   Arthritis    Cancer (HCC)    CKD (chronic kidney disease)    GERD (gastroesophageal reflux disease)    Hypertension 1980   Hypothyroidism 1999   Median arcuate ligament syndrome    treated with ligament release and celiac artery stent, 07/2020.   Pneumonia    Pulmonary embolism (HCC)    Shingles    Wears dentures    full upper and lower    Past Surgical History:  Procedure Laterality Date   BRAIN SURGERY     aneurysm   BREAST CYST ASPIRATION Left 2005   BREAST EXCISIONAL BIOPSY Left 2013   atypical ductal hyperplasia   BREAST SURGERY  2013   LF Breast Wide EXC    CHOLECYSTECTOMY  2004   COLONOSCOPY     COLONOSCOPY WITH PROPOFOL  N/A 01/31/2018   Procedure: COLONOSCOPY WITH PROPOFOL ;  Surgeon: Gaylyn Gladis PENNER, MD;  Location: [facility] ENDOSCOPY;  Service: Endoscopy;  Laterality: N/A;   COLONOSCOPY WITH PROPOFOL  N/A 08/06/2020   Procedure: COLONOSCOPY WITH PROPOFOL ;  Surgeon: Janalyn Keene NOVAK, MD;  Location: [facility] ENDOSCOPY;  Service: Endoscopy;  Laterality: N/A;   DILATION AND CURETTAGE OF UTERUS N/A 05/03/2019   Procedure: DILATATION AND CURETTAGE;  Surgeon: Arloa Lamar SQUIBB, MD;  Location: [facility] ORS;  Service: Gynecology;  Laterality: N/A;   ESOPHAGOGASTRODUODENOSCOPY N/A 01/31/2018    Procedure: ESOPHAGOGASTRODUODENOSCOPY (EGD);  Surgeon: Gaylyn Gladis PENNER, MD;  Location: [facility] ENDOSCOPY;  Service: Endoscopy;  Laterality: N/A;   ESOPHAGOGASTRODUODENOSCOPY (EGD) WITH PROPOFOL  N/A 08/06/2020   Procedure: ESOPHAGOGASTRODUODENOSCOPY (EGD) WITH PROPOFOL ;  Surgeon: Janalyn Keene NOVAK, MD;  Location: [facility] ENDOSCOPY;  Service: Endoscopy;  Laterality: N/A;   LUMBAR LAMINECTOMY/DECOMPRESSION MICRODISCECTOMY N/A 07/01/2021   Procedure: LEFT L3-4 MICRODSICECTOMY, L4-S1 DECOMPRESSION;  Surgeon: Clois Fret, MD;  Location: [facility] ORS;  Service: Neurosurgery;  Laterality: N/A;   LUMBAR PUNCTURE     SHOULDER ARTHROSCOPY Left 07/25/2018   Procedure: SHOULDER MINI OPEN ROTATOR CUFF REPAIR  BICEPS TENDOSIS ARTHROSCOPIC DISTAL CLAVICLE EXCISION  SUBACROMIAL DECOMP;  Surgeon: Tobie Priest, MD;  Location: [facility];  Service: Orthopedics;  Laterality: Left;  [facility] WITH SPYDER SMITH & NEPHEW HEAD COIL ANCHOR FOOTPRINT ANCHOR QFIX ANCHOR   VISCERAL ANGIOGRAPHY N/A 08/14/2020   Procedure: VISCERAL ANGIOGRAPHY;  Surgeon: Marea Selinda RAMAN, MD;  Location: [facility] INVASIVE CV LAB;  Service: Cardiovascular;  Laterality: N/A;   VISCERAL ANGIOGRAPHY N/A 09/03/2021   Procedure: VISCERAL ANGIOGRAPHY;  Surgeon: Marea Selinda RAMAN, MD;  Location: [facility] INV[facility] CV LAB;  Service: Cardiovascular;  Laterality: N/A;    Current Outpatient Medications  Medication Sig Dispense Refill   acetaminophen  (TYLENOL ) 325 MG tablet Take 2 tablets (650 mg total) by mouth every 6 (six) hours as needed for mild pain, moderate pain, fever or headache (or Fever >/= 101).     apixaban  (ELIQUIS ) 5 MG TABS tablet Take 1 tablet (5 mg total) by mouth 2 (two) times daily. 180 tablet 2   citalopram  (CELEXA ) 20 MG tablet Take 20 mg by mouth daily.     furosemide (LASIX) 20 MG tablet Take 20 mg by mouth daily.     latanoprost (XALATAN) 0.005 % ophthalmic solution 1 drop daily.     levothyroxine  (SYNTHROID , LEVOTHROID) 75 MCG  tablet Take 75 mcg by mouth daily.     losartan  (COZAAR ) 50 MG tablet Take 50 mg by mouth daily.      nortriptyline (PAMELOR) 10 MG capsule Take 20 mg by mouth at bedtime.     solifenacin (VESICARE) 5 MG tablet Take 5 mg by mouth daily.     famotidine  (PEPCID ) 20 MG tablet Take 1 tablet (20 mg total) by mouth at bedtime. (Patient not taking: Reported on 08/15/2024) 30 tablet 11   pantoprazole  (PROTONIX ) 40 MG tablet Take 1 tablet (40 mg total) by mouth 2 (two) times daily before a meal. (Patient not taking: Reported on 08/15/2024) 60 tablet 5   No current facility-administered medications for this visit.    Allergies as of 08/15/2024 - Review Complete 08/15/2024  Allergen Reaction Noted   Bee venom Swelling 01/31/2018   Shellfish allergy Swelling 01/31/2018   Tamoxifen Hives 07/20/2018   Penicillins Hives, Rash, and Other (See Comments) 12/21/2012    Family History  Problem Relation Age of Onset   Other Brother        Lynch syndrome/colon cancer, age <60   Breast cancer Neg Hx     Social History   Socioeconomic History   Marital  status: Divorced    Spouse name: Not on file   Number of children: Not on file   Years of education: Not on file   Highest education level: Not on file  Occupational History   Not on file  Tobacco Use   Smoking status: Former    Current packs/day: 0.00    Average packs/day: 1.5 packs/day for 20.0 years (30.0 ttl pk-yrs)    Types: Cigarettes    Start date: 70    Quit date: 85    Years since quitting: 35.7   Smokeless tobacco: Never  Vaping Use   Vaping status: Never Used  Substance and Sexual Activity   Alcohol use: No   Drug use: No   Sexual activity: Not on file  Other Topics Concern   Not on file  Social History Narrative   Lives alone   Social Drivers of Health   Financial Resource Strain: Patient Declined (08/07/2024)   Received from [facility]   Overall Financial Resource Strain (CARDIA)    Difficulty of  Paying Living Expenses: Patient declined  Food Insecurity: Patient Declined (08/07/2024)   Received from [facility]   Hunger Vital Sign    Within the past 12 months, you worried that your food would run out before you got the money to buy more.: Patient declined    Within the past 12 months, the food you bought just didn't last and you didn't have money to get more.: Patient declined  Transportation Needs: Patient Declined (08/07/2024)   Received from [facility] - Transportation    In the past 12 months, has lack of transportation kept you from medical appointments or from getting medications?: Patient declined    Lack of Transportation (Non-Medical): Patient declined  Physical Activity: Not on file  Stress: Not on file  Social Connections: Not on file    Subjective: Review of Systems  Constitutional:  Negative for chills and fever.  HENT:  Negative for congestion and hearing loss.   Eyes:  Negative for blurred vision and double vision.  Respiratory:  Negative for cough and shortness of breath.   Cardiovascular:  Negative for chest pain and palpitations.  Gastrointestinal:  Positive for heartburn. Negative for abdominal pain, blood in stool, constipation, diarrhea, melena and vomiting.       Dysphagia  Genitourinary:  Negative for dysuria and urgency.  Musculoskeletal:  Negative for joint pain and myalgias.  Skin:  Negative for itching and rash.  Neurological:  Negative for dizziness and headaches.  Psychiatric/Behavioral:  Negative for depression. The patient is not nervous/anxious.      Objective: BP (!) 165/102 (BP Location: Left Arm, Patient Position: Sitting, Cuff Size: Normal)   Pulse 69   Temp 98.6 F (37 C) (Temporal)   Ht 5' (1.524 m)   Wt 203 lb 14.4 oz (92.5 kg)   BMI 39.82 kg/m  Physical Exam Constitutional:      Appearance: Normal appearance.  HENT:     Head: Normocephalic and atraumatic.  Eyes:     Extraocular  Movements: Extraocular movements intact.     Conjunctiva/sclera: Conjunctivae normal.  Cardiovascular:     Rate and Rhythm: Normal rate and regular rhythm.  Pulmonary:     Effort: Pulmonary effort is normal.     Breath sounds: Normal breath sounds.  Abdominal:     General: Bowel sounds are normal.     Palpations: Abdomen is soft.  Musculoskeletal:  General: No swelling. Normal range of motion.     Cervical back: Normal range of motion and neck supple.  Skin:    General: Skin is warm and dry.     Coloration: Skin is not jaundiced.  Neurological:     General: No focal deficit present.     Mental Status: She is alert and oriented to person, place, and time.  Psychiatric:        Mood and Affect: Mood normal.        Behavior: Behavior normal.      Assessment: *Chronic GERD-not well-controlled *Esophageal dysphagia *Barrett's esophagus *Chronic anticoagulation  Plan: Discussed in depth with patient and daughter today.  Patient having breakthrough symptoms though not taking her PPI appropriately.  Taking this as needed in a reactive manner.  Recommend she take this every day 30 minutes before breakfast and 30 minutes for dinner.  Will schedule for EGD with possible dilation to evaluate for peptic ulcer disease, esophagitis, gastritis, H. Pylori, duodenitis, or other. Will also evaluate for esophageal stricture, Schatzki's ring, esophageal web or other.   The risks including infection, bleed, or perforation as well as benefits, limitations, alternatives and imponderables have been reviewed with the patient. Potential for esophageal dilation, biopsy, etc. have also been reviewed.  Questions have been answered. All parties agreeable.  Patient chronically on Eliquis  for history of PE after shoulder surgery diagnosed in 2019.  Reports she has held this medication numerous times since for procedures.  She will need to hold Eliquis  x 2 days prior to her upper endoscopy.  Discussed  increased risk of cardiovascular event during this time and she understands.  Follow-up after EGD. 08/15/2024 3:55 PM   Disclaimer: This note was dictated with voice recognition software. Similar sounding words can inadvertently be transcribed and may not be corrected upon review.

## 2024-08-15 NOTE — Progress Notes (Signed)
 Referring Provider: Valora Agent, MD Primary Care Physician:  Valora Agent, MD Primary GI:  Dr. Cindie  Chief Complaint  Patient presents with   Follow-up    Patient here today in need for Egd, but patient has low pulmonary function and they are here to discuss this and the need of an egd. Patient is having issues with burning sensation daily, and causes nausea and vomiting. Patient says she usually drinks milk during these episodes and this helps with the burning. Patient has been using pepto prn. She is supposed to be on pantoprazole  40 mg bid, and famotidine  20 mg at bedtime, but patient says she does not take this as the milk seems to help.     HPI:   Latoya Freeman is a 83 y.o. female who presents to clinic today for acute visit for worsening reflux.  Reports reflux relatively well-controlled during the day, but at night her symptoms worsens.  Will wake up about twice per week with reflux and heartburn and has to vomit green stuff to get relief.  Supposed to be taking pantoprazole  twice daily.  Patient admits that she only takes this medication reactively if she is having symptoms.  Her daughter confirms this.  Also reports esophageal dysphagia.  Food will get stuck in her substernal region at times.  Occasional regurgitation.  States she has to eat her meals very slowly.  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   07/2020: Median arcuate ligament release with celiac stent placement in [place]. Complicated by bleeding.    Ph/impedence 2023: control of distal esophageal acid excellent, control of gastric acid excellent, normal absolute number of gastroesophageal reflux events on PPI BID, negative symptoms correlation for all symptoms.   UGI series 09/2021: severe gastroesophageal reflux exending to cervical esophagus.   EGD and colonoscopy for iron  deficiency anemia in September  2021. EGD showed salmon-colored mucosa suggestive of short segment Barrett's, bx neg. Duodenal biopsies negative for celiac, gastric bx with mild chronic gastritis with reactive changes, no h.pylori. Colonoscopy with 2 subcentimeter polyps (tubular adenomas)removed    -colonoscopy: 01/2018 - pandiverticulosis, one 3 mm polyp at hepatic flexure with absent tissue on biopsy, and one 4 mm polyp at splenic flexure with path showing tubular adenoma - EGD: 01/2018 - short-segment Barrett's esophagus without dysplasia, erosive gastritis with minimal chronic gastritis, single gastric polyp with minimal chronic inflammation, normal duodenum    Past Medical History:  Diagnosis Date   Aneurysm 1980   Arthritis    Cancer (HCC)    CKD (chronic kidney disease)    GERD (gastroesophageal reflux disease)    Hypertension 1980   Hypothyroidism 1999   Median arcuate ligament syndrome    treated with ligament release and celiac artery stent, 07/2020.   Pneumonia    Pulmonary embolism (HCC)    Shingles    Wears dentures    full upper and lower    Past Surgical History:  Procedure Laterality Date   BRAIN SURGERY     aneurysm   BREAST CYST ASPIRATION Left 2005   BREAST EXCISIONAL BIOPSY Left 2013   atypical ductal hyperplasia   BREAST SURGERY  2013   LF Breast Wide EXC    CHOLECYSTECTOMY  2004   COLONOSCOPY     COLONOSCOPY WITH PROPOFOL  N/A 01/31/2018   Procedure: COLONOSCOPY WITH PROPOFOL ;  Surgeon: Gaylyn Gladis PENNER, MD;  Location: [facility] ENDOSCOPY;  Service: Endoscopy;  Laterality: N/A;   COLONOSCOPY WITH PROPOFOL  N/A 08/06/2020   Procedure: COLONOSCOPY WITH PROPOFOL ;  Surgeon: Janalyn Keene NOVAK, MD;  Location: [facility] ENDOSCOPY;  Service: Endoscopy;  Laterality: N/A;   DILATION AND CURETTAGE OF UTERUS N/A 05/03/2019   Procedure: DILATATION AND CURETTAGE;  Surgeon: Arloa Lamar SQUIBB, MD;  Location: [facility] ORS;  Service: Gynecology;  Laterality: N/A;   ESOPHAGOGASTRODUODENOSCOPY N/A 01/31/2018    Procedure: ESOPHAGOGASTRODUODENOSCOPY (EGD);  Surgeon: Gaylyn Gladis PENNER, MD;  Location: [facility] ENDOSCOPY;  Service: Endoscopy;  Laterality: N/A;   ESOPHAGOGASTRODUODENOSCOPY (EGD) WITH PROPOFOL  N/A 08/06/2020   Procedure: ESOPHAGOGASTRODUODENOSCOPY (EGD) WITH PROPOFOL ;  Surgeon: Janalyn Keene NOVAK, MD;  Location: [facility] ENDOSCOPY;  Service: Endoscopy;  Laterality: N/A;   LUMBAR LAMINECTOMY/DECOMPRESSION MICRODISCECTOMY N/A 07/01/2021   Procedure: LEFT L3-4 MICRODSICECTOMY, L4-S1 DECOMPRESSION;  Surgeon: Clois Fret, MD;  Location: [facility] ORS;  Service: Neurosurgery;  Laterality: N/A;   LUMBAR PUNCTURE     SHOULDER ARTHROSCOPY Left 07/25/2018   Procedure: SHOULDER MINI OPEN ROTATOR CUFF REPAIR  BICEPS TENDOSIS ARTHROSCOPIC DISTAL CLAVICLE EXCISION  SUBACROMIAL DECOMP;  Surgeon: Tobie Priest, MD;  Location: [facility];  Service: Orthopedics;  Laterality: Left;  [facility] WITH SPYDER SMITH & NEPHEW HEAD COIL ANCHOR FOOTPRINT ANCHOR QFIX ANCHOR   VISCERAL ANGIOGRAPHY N/A 08/14/2020   Procedure: VISCERAL ANGIOGRAPHY;  Surgeon: Marea Selinda RAMAN, MD;  Location: [facility] INV[facility] CV LAB;  Service: Cardiovascular;  Laterality: N/A;   VISCERAL ANGIOGRAPHY N/A 09/03/2021   Procedure: VISCERAL ANGIOGRAPHY;  Surgeon: Marea Selinda RAMAN, MD;  Location: [facility] CV [facility];  Service: Cardiovascular;  Laterality: N/A;    Current Outpatient Medications  Medication Sig Dispense Refill   acetaminophen  (TYLENOL ) 325 MG tablet Take 2 tablets (650 mg total) by mouth every 6 (six) hours as needed for mild pain, moderate pain, fever or headache (or Fever >/= 101).     apixaban  (ELIQUIS ) 5 MG TABS tablet Take 1 tablet (5 mg total) by mouth 2 (two) times daily. 180 tablet 2   citalopram  (CELEXA ) 20 MG tablet Take 20 mg by mouth daily.     furosemide (LASIX) 20 MG tablet Take 20 mg by mouth daily.     latanoprost (XALATAN) 0.005 % ophthalmic solution 1 drop daily.     levothyroxine  (SYNTHROID , LEVOTHROID) 75 MCG  tablet Take 75 mcg by mouth daily.     losartan  (COZAAR ) 50 MG tablet Take 50 mg by mouth daily.      nortriptyline (PAMELOR) 10 MG capsule Take 20 mg by mouth at bedtime.     solifenacin (VESICARE) 5 MG tablet Take 5 mg by mouth daily.     famotidine  (PEPCID ) 20 MG tablet Take 1 tablet (20 mg total) by mouth at bedtime. (Patient not taking: Reported on 08/15/2024) 30 tablet 11   pantoprazole  (PROTONIX ) 40 MG tablet Take 1 tablet (40 mg total) by mouth 2 (two) times daily before a meal. (Patient not taking: Reported on 08/15/2024) 60 tablet 5   No current facility-administered medications for this visit.    Allergies as of 08/15/2024 - Review Complete 08/15/2024  Allergen Reaction Noted   Bee venom Swelling 01/31/2018   Shellfish allergy Swelling 01/31/2018   Tamoxifen Hives 07/20/2018   Penicillins Hives, Rash, and Other (See Comments) 12/21/2012    Family History  Problem Relation Age of Onset   Other Brother        Lynch syndrome/colon cancer, age <60   Breast cancer Neg Hx     Social History   Socioeconomic History   Marital  status: Divorced    Spouse name: Not on file   Number of children: Not on file   Years of education: Not on file   Highest education level: Not on file  Occupational History   Not on file  Tobacco Use   Smoking status: Former    Current packs/day: 0.00    Average packs/day: 1.5 packs/day for 20.0 years (30.0 ttl pk-yrs)    Types: Cigarettes    Start date: 70    Quit date: 85    Years since quitting: 35.7   Smokeless tobacco: Never  Vaping Use   Vaping status: Never Used  Substance and Sexual Activity   Alcohol use: No   Drug use: No   Sexual activity: Not on file  Other Topics Concern   Not on file  Social History Narrative   Lives alone   Social Drivers of Health   Financial Resource Strain: Patient Declined (08/07/2024)   Received from [facility]   Overall Financial Resource Strain (CARDIA)    Difficulty of  Paying Living Expenses: Patient declined  Food Insecurity: Patient Declined (08/07/2024)   Received from [facility]   Hunger Vital Sign    Within the past 12 months, you worried that your food would run out before you got the money to buy more.: Patient declined    Within the past 12 months, the food you bought just didn't last and you didn't have money to get more.: Patient declined  Transportation Needs: Patient Declined (08/07/2024)   Received from [facility] - Transportation    In the past 12 months, has lack of transportation kept you from medical appointments or from getting medications?: Patient declined    Lack of Transportation (Non-Medical): Patient declined  Physical Activity: Not on file  Stress: Not on file  Social Connections: Not on file    Subjective: Review of Systems  Constitutional:  Negative for chills and fever.  HENT:  Negative for congestion and hearing loss.   Eyes:  Negative for blurred vision and double vision.  Respiratory:  Negative for cough and shortness of breath.   Cardiovascular:  Negative for chest pain and palpitations.  Gastrointestinal:  Positive for heartburn. Negative for abdominal pain, blood in stool, constipation, diarrhea, melena and vomiting.       Dysphagia  Genitourinary:  Negative for dysuria and urgency.  Musculoskeletal:  Negative for joint pain and myalgias.  Skin:  Negative for itching and rash.  Neurological:  Negative for dizziness and headaches.  Psychiatric/Behavioral:  Negative for depression. The patient is not nervous/anxious.      Objective: BP (!) 165/102 (BP Location: Left Arm, Patient Position: Sitting, Cuff Size: Normal)   Pulse 69   Temp 98.6 F (37 C) (Temporal)   Ht 5' (1.524 m)   Wt 203 lb 14.4 oz (92.5 kg)   BMI 39.82 kg/m  Physical Exam Constitutional:      Appearance: Normal appearance.  HENT:     Head: Normocephalic and atraumatic.  Eyes:     Extraocular  Movements: Extraocular movements intact.     Conjunctiva/sclera: Conjunctivae normal.  Cardiovascular:     Rate and Rhythm: Normal rate and regular rhythm.  Pulmonary:     Effort: Pulmonary effort is normal.     Breath sounds: Normal breath sounds.  Abdominal:     General: Bowel sounds are normal.     Palpations: Abdomen is soft.  Musculoskeletal:  General: No swelling. Normal range of motion.     Cervical back: Normal range of motion and neck supple.  Skin:    General: Skin is warm and dry.     Coloration: Skin is not jaundiced.  Neurological:     General: No focal deficit present.     Mental Status: She is alert and oriented to person, place, and time.  Psychiatric:        Mood and Affect: Mood normal.        Behavior: Behavior normal.      Assessment: *Chronic GERD-not well-controlled *Esophageal dysphagia *Barrett's esophagus *Chronic anticoagulation  Plan: Discussed in depth with patient and daughter today.  Patient having breakthrough symptoms though not taking her PPI appropriately.  Taking this as needed in a reactive manner.  Recommend she take this every day 30 minutes before breakfast and 30 minutes for dinner.  Will schedule for EGD with possible dilation to evaluate for peptic ulcer disease, esophagitis, gastritis, H. Pylori, duodenitis, or other. Will also evaluate for esophageal stricture, Schatzki's ring, esophageal web or other.   The risks including infection, bleed, or perforation as well as benefits, limitations, alternatives and imponderables have been reviewed with the patient. Potential for esophageal dilation, biopsy, etc. have also been reviewed.  Questions have been answered. All parties agreeable.  Patient chronically on Eliquis  for history of PE after shoulder surgery diagnosed in 2019.  Reports she has held this medication numerous times since for procedures.  She will need to hold Eliquis  x 2 days prior to her upper endoscopy.  Discussed  increased risk of cardiovascular event during this time and she understands.  Follow-up after EGD. 08/15/2024 3:55 PM   Disclaimer: This note was dictated with voice recognition software. Similar sounding words can inadvertently be transcribed and may not be corrected upon review.

## 2024-08-16 ENCOUNTER — Telehealth (INDEPENDENT_AMBULATORY_CARE_PROVIDER_SITE_OTHER): Payer: Self-pay

## 2024-08-16 NOTE — Telephone Encounter (Signed)
 Per Dr. Melanee Hold eliquis  3 days prior to procedure and start day after procedure.  Set aside clearance form for Dr. Melanee to sign upon returning to clinic tomorrow.  Outbound call;

## 2024-08-16 NOTE — Telephone Encounter (Signed)
    08/16/24  Latoya Freeman [birth year]  What type of surgery is being performed? EGD, Dilation  When is surgery scheduled? To be determined  What type of clearance is required (medical or pharmacy to hold medication or both? medication  Are there any medications that need to be held prior to surgery and how long? Eliquis , hold 5 days prior to procedure  Name of physician performing surgery?  Dr. Carlin Hasty [facility] at [facility] Phone: [phone] Fax: [phone]  Anethesia type (none, local, MAC, general)? MAC     ? Yes ? No Patient can hold medication as requested   Signature: ___________________________

## 2024-08-16 NOTE — Telephone Encounter (Signed)
 Hold eliquis  3 days prior to procedure and start day after procedure

## 2024-08-17 NOTE — Telephone Encounter (Signed)
 Form faxed, confirmation received.

## 2024-08-20 NOTE — Telephone Encounter (Signed)
 Spoke with patients daughter Dorothe, scheduled pt for EGD with DIL on 09/03/2024. Instructions sent through [place] and through mail.

## 2024-08-20 NOTE — Telephone Encounter (Signed)
 Called patients daughter Dorothe to schedule EGD, no answer. LVM.

## 2024-08-20 NOTE — Telephone Encounter (Signed)
 PA on [employer] for EGD Approved Authorization #[id number]  Tracking [phone] [phone]

## 2024-08-20 NOTE — Telephone Encounter (Signed)
 Clearance received but Eliquis  only needs to be held x 2 days,

## 2024-08-21 DIAGNOSIS — D631 Anemia in chronic kidney disease: Secondary | ICD-10-CM | POA: Diagnosis not present

## 2024-08-21 DIAGNOSIS — R809 Proteinuria, unspecified: Secondary | ICD-10-CM | POA: Diagnosis not present

## 2024-08-21 DIAGNOSIS — M62830 Muscle spasm of back: Secondary | ICD-10-CM | POA: Diagnosis not present

## 2024-08-21 DIAGNOSIS — M9903 Segmental and somatic dysfunction of lumbar region: Secondary | ICD-10-CM | POA: Diagnosis not present

## 2024-08-21 DIAGNOSIS — I129 Hypertensive chronic kidney disease with stage 1 through stage 4 chronic kidney disease, or unspecified chronic kidney disease: Secondary | ICD-10-CM | POA: Diagnosis not present

## 2024-08-21 DIAGNOSIS — N1832 Chronic kidney disease, stage 3b: Secondary | ICD-10-CM | POA: Diagnosis not present

## 2024-08-21 DIAGNOSIS — E876 Hypokalemia: Secondary | ICD-10-CM | POA: Diagnosis not present

## 2024-08-21 DIAGNOSIS — N2889 Other specified disorders of kidney and ureter: Secondary | ICD-10-CM | POA: Diagnosis not present

## 2024-08-21 DIAGNOSIS — M9901 Segmental and somatic dysfunction of cervical region: Secondary | ICD-10-CM | POA: Diagnosis not present

## 2024-08-21 DIAGNOSIS — N2581 Secondary hyperparathyroidism of renal origin: Secondary | ICD-10-CM | POA: Diagnosis not present

## 2024-08-21 DIAGNOSIS — M9902 Segmental and somatic dysfunction of thoracic region: Secondary | ICD-10-CM | POA: Diagnosis not present

## 2024-08-28 ENCOUNTER — Encounter: Payer: Self-pay | Admitting: Oncology

## 2024-08-28 ENCOUNTER — Other Ambulatory Visit: Payer: Self-pay

## 2024-08-28 DIAGNOSIS — M9901 Segmental and somatic dysfunction of cervical region: Secondary | ICD-10-CM | POA: Diagnosis not present

## 2024-08-28 DIAGNOSIS — M9902 Segmental and somatic dysfunction of thoracic region: Secondary | ICD-10-CM | POA: Diagnosis not present

## 2024-08-28 DIAGNOSIS — M62830 Muscle spasm of back: Secondary | ICD-10-CM | POA: Diagnosis not present

## 2024-08-28 DIAGNOSIS — M9903 Segmental and somatic dysfunction of lumbar region: Secondary | ICD-10-CM | POA: Diagnosis not present

## 2024-08-28 MED ORDER — FLUZONE HIGH-DOSE 0.5 ML IM SUSY
0.5000 mL | PREFILLED_SYRINGE | Freq: Once | INTRAMUSCULAR | 0 refills | Status: AC
  Filled 2024-08-28: qty 0.5, 1d supply, fill #0

## 2024-08-29 ENCOUNTER — Encounter (HOSPITAL_COMMUNITY)
Admission: RE | Admit: 2024-08-29 | Discharge: 2024-08-29 | Disposition: A | Discharge status: Home / Self Care | Source: Ambulatory Visit | Attending: Internal Medicine | Admitting: Internal Medicine

## 2024-08-29 ENCOUNTER — Encounter (HOSPITAL_COMMUNITY): Payer: Self-pay

## 2024-08-29 HISTORY — DX: Dyspnea, unspecified: R06.00

## 2024-09-03 ENCOUNTER — Other Ambulatory Visit: Payer: Self-pay

## 2024-09-03 ENCOUNTER — Encounter (HOSPITAL_COMMUNITY): Payer: Self-pay | Admitting: Internal Medicine

## 2024-09-03 ENCOUNTER — Encounter (HOSPITAL_COMMUNITY)
Admission: RE | Disposition: A | Discharge status: Home / Self Care | Payer: Self-pay | Source: Home / Self Care | Attending: Internal Medicine

## 2024-09-03 ENCOUNTER — Ambulatory Visit (HOSPITAL_COMMUNITY): Admitting: Anesthesiology

## 2024-09-03 ENCOUNTER — Ambulatory Visit (HOSPITAL_COMMUNITY)
Admission: RE | Admit: 2024-09-03 | Discharge: 2024-09-03 | Disposition: A | Discharge status: Home / Self Care | Attending: Internal Medicine | Admitting: Internal Medicine

## 2024-09-03 DIAGNOSIS — N189 Chronic kidney disease, unspecified: Secondary | ICD-10-CM | POA: Insufficient documentation

## 2024-09-03 DIAGNOSIS — R1314 Dysphagia, pharyngoesophageal phase: Secondary | ICD-10-CM | POA: Diagnosis not present

## 2024-09-03 DIAGNOSIS — J449 Chronic obstructive pulmonary disease, unspecified: Secondary | ICD-10-CM | POA: Insufficient documentation

## 2024-09-03 DIAGNOSIS — K2100 Gastro-esophageal reflux disease with esophagitis, without bleeding: Secondary | ICD-10-CM | POA: Insufficient documentation

## 2024-09-03 DIAGNOSIS — Z87891 Personal history of nicotine dependence: Secondary | ICD-10-CM

## 2024-09-03 DIAGNOSIS — Z86711 Personal history of pulmonary embolism: Secondary | ICD-10-CM | POA: Insufficient documentation

## 2024-09-03 DIAGNOSIS — Z91148 Patient's other noncompliance with medication regimen for other reason: Secondary | ICD-10-CM | POA: Diagnosis not present

## 2024-09-03 DIAGNOSIS — T470X6A Underdosing of histamine H2-receptor blockers, initial encounter: Secondary | ICD-10-CM | POA: Insufficient documentation

## 2024-09-03 DIAGNOSIS — K449 Diaphragmatic hernia without obstruction or gangrene: Secondary | ICD-10-CM | POA: Insufficient documentation

## 2024-09-03 DIAGNOSIS — Z7901 Long term (current) use of anticoagulants: Secondary | ICD-10-CM | POA: Insufficient documentation

## 2024-09-03 DIAGNOSIS — I129 Hypertensive chronic kidney disease with stage 1 through stage 4 chronic kidney disease, or unspecified chronic kidney disease: Secondary | ICD-10-CM | POA: Diagnosis not present

## 2024-09-03 DIAGNOSIS — K2970 Gastritis, unspecified, without bleeding: Secondary | ICD-10-CM | POA: Diagnosis not present

## 2024-09-03 DIAGNOSIS — E66813 Obesity, class 3: Secondary | ICD-10-CM | POA: Insufficient documentation

## 2024-09-03 DIAGNOSIS — K222 Esophageal obstruction: Secondary | ICD-10-CM | POA: Insufficient documentation

## 2024-09-03 DIAGNOSIS — T471X6A Underdosing of other antacids and anti-gastric-secretion drugs, initial encounter: Secondary | ICD-10-CM | POA: Insufficient documentation

## 2024-09-03 DIAGNOSIS — K2950 Unspecified chronic gastritis without bleeding: Secondary | ICD-10-CM | POA: Diagnosis not present

## 2024-09-03 DIAGNOSIS — Q394 Esophageal web: Secondary | ICD-10-CM | POA: Diagnosis not present

## 2024-09-03 DIAGNOSIS — N183 Chronic kidney disease, stage 3 unspecified: Secondary | ICD-10-CM | POA: Diagnosis not present

## 2024-09-03 DIAGNOSIS — Z6839 Body mass index (BMI) 39.0-39.9, adult: Secondary | ICD-10-CM | POA: Diagnosis not present

## 2024-09-03 DIAGNOSIS — K209 Esophagitis, unspecified without bleeding: Secondary | ICD-10-CM | POA: Diagnosis not present

## 2024-09-03 DIAGNOSIS — E039 Hypothyroidism, unspecified: Secondary | ICD-10-CM | POA: Diagnosis not present

## 2024-09-03 DIAGNOSIS — R1310 Dysphagia, unspecified: Secondary | ICD-10-CM | POA: Diagnosis present

## 2024-09-03 SURGERY — EGD (ESOPHAGOGASTRODUODENOSCOPY)
Anesthesia: General

## 2024-09-03 MED ORDER — DEXLANSOPRAZOLE 60 MG PO CPDR: 60.0000 mg | DELAYED_RELEASE_CAPSULE | Freq: Every day | ORAL | 3 refills | Status: DC

## 2024-09-03 MED ORDER — LIDOCAINE 2% (20 MG/ML) 5 ML SYRINGE
INTRAMUSCULAR | Status: DC | PRN
  Administered 2024-09-03: 80 mg via INTRAVENOUS

## 2024-09-03 MED ORDER — PROPOFOL 500 MG/50ML IV EMUL
INTRAVENOUS | Status: DC | PRN
Start: 2024-09-03 — End: 2024-09-03
  Administered 2024-09-03 (×2): 50 mg via INTRAVENOUS

## 2024-09-03 MED ORDER — DEXLANSOPRAZOLE 60 MG PO CPDR: 60.0000 mg | DELAYED_RELEASE_CAPSULE | Freq: Every day | ORAL | 3 refills | Status: AC

## 2024-09-03 MED ORDER — LACTATED RINGERS IV SOLN: INTRAVENOUS | Status: DC

## 2024-09-03 MED ORDER — LACTATED RINGERS IV SOLN: INTRAVENOUS | Status: DC | PRN

## 2024-09-03 NOTE — Discharge Instructions (Addendum)
 EGD Discharge instructions Please read the instructions outlined below and refer to this sheet in the next few weeks. These discharge instructions provide you with general information on caring for yourself after you leave the hospital. Your doctor may also give you specific instructions. While your treatment has been planned according to the most current medical practices available, unavoidable complications occasionally occur. If you have any problems or questions after discharge, please call your doctor. ACTIVITY You may resume your regular activity but move at a slower pace for the next 24 hours.  Take frequent rest periods for the next 24 hours.  Walking will help expel (get rid of) the air and reduce the bloated feeling in your abdomen.  No driving for 24 hours (because of the anesthesia (medicine) used during the test).  You may shower.  Do not sign any important legal documents or operate any machinery for 24 hours (because of the anesthesia used during the test).  NUTRITION Drink plenty of fluids.  You may resume your normal diet.  Begin with a light meal and progress to your normal diet.  Avoid alcoholic beverages for 24 hours or as instructed by your caregiver.  MEDICATIONS You may resume your normal medications unless your caregiver tells you otherwise.  WHAT YOU CAN EXPECT TODAY You may experience abdominal discomfort such as a feeling of fullness or "gas" pains.  FOLLOW-UP Your doctor will discuss the results of your test with you.  SEEK IMMEDIATE MEDICAL ATTENTION IF ANY OF THE FOLLOWING OCCUR: Excessive nausea (feeling sick to your stomach) and/or vomiting.  Severe abdominal pain and distention (swelling).  Trouble swallowing.  Temperature over 101 F (37.8 C).  Rectal bleeding or vomiting of blood.   Your upper endoscopy revealed a small hiatal hernia.  You had a mild tightening of your esophagus called a Schatzki's ring.  I stretched this out today.  You also had  evidence of reflux esophagitis.  I took samples of this today. Your EGD also revealed moderate amount inflammation in your stomach.  I took biopsies of this to rule out infection with a bacteria called H. pylori.  Small bowel was normal.  Await pathology results, my office will contact you.  I am going to switch your pantoprazole  to dexlansoprazole  60 mg daily.  I have sent this to your mail-in pharmacy.  Limit NSAID use as best as you can.  Follow-up in GI office in 2 to 3 months.  I hope you have a great rest of your week!  Carlin POUR. Cindie, D.O. Gastroenterology and Hepatology [facility]

## 2024-09-03 NOTE — Op Note (Signed)
 [facility] Patient Name: Latoya Freeman Procedure Date: 09/03/2024 9:17 AM MRN: [id number] Date of Birth: 1941-01-25 Attending MD: Carlin POUR. Cindie , [place], [id number] CSN: [id number] Age: 83 Admit Type: Outpatient Procedure:                Upper GI endoscopy Indications:              Dysphagia, Heartburn Providers:                Carlin POUR. Cindie, DO, Harlene Lips, Kristine                            L. [place] Tech, Technician Referring MD:              Medicines:                See the Anesthesia note for documentation of the                            administered medications Complications:            No immediate complications. Estimated Blood Loss:     Estimated blood loss was minimal. Procedure:                Pre-Anesthesia Assessment:                           - The anesthesia plan was to use monitored                            anesthesia care (MAC).                           After obtaining informed consent, the endoscope was                            passed under direct vision. Throughout the                            procedure, the patient's blood pressure, pulse, and                            oxygen saturations were monitored continuously. The                            [id number] ([id number]) scope was introduced through                            the mouth, and advanced to the second part of                            duodenum. The upper GI endoscopy was accomplished                            without difficulty. The patient tolerated the                            procedure well. Scope In: 9:34:51  AM Scope Out: 9:40:52 AM Total Procedure Duration: 0 hours 6 minutes 1 second  Findings:      A small hiatal hernia was present.      LA Grade A (one or more mucosal breaks less than 5 mm, not extending       between tops of 2 mucosal folds) esophagitis with no bleeding was found       at the gastroesophageal junction. Biopsies were taken with a cold       forceps  for histology.      A mild Schatzki ring was found in the distal esophagus. A TTS dilator       was passed through the scope. Dilation with an 18-19-20 mm balloon       dilator was performed to 20 mm. The dilation site was examined and       showed moderate improvement in luminal narrowing.      Patchy moderate inflammation characterized by erosions and erythema was       found in the entire examined stomach. Biopsies were taken with a cold       forceps for Helicobacter pylori testing.      The duodenal bulb, first portion of the duodenum and second portion of       the duodenum were normal. Impression:               - Small hiatal hernia.                           - LA Grade A reflux esophagitis with no bleeding.                            Biopsied.                           - Mild Schatzki ring. Dilated.                           - Gastritis. Biopsied.                           - Normal duodenal bulb, first portion of the                            duodenum and second portion of the duodenum. Moderate Sedation:      Per Anesthesia Care Recommendation:           - Patient has a contact number available for                            emergencies. The signs and symptoms of potential                            delayed complications were discussed with the                            patient. Return to normal activities tomorrow.                            Written discharge instructions were provided to the  patient.                           - Resume previous diet.                           - Continue present medications.                           - Await pathology results.                           - Repeat upper endoscopy PRN for retreatment.                           - Return to GI clinic in 3 months.                           - No ibuprofen, naproxen, or other non-steroidal                            anti-inflammatory drugs.                           - Will  switch pantoprazole  to dexlansoprazole  60 mg                            daily. Procedure Code(s):        --- Professional ---                           [phone], Esophagogastroduodenoscopy, flexible,                            transoral; with transendoscopic balloon dilation of                            esophagus (less than 30 mm diameter)                           43239, 59, Esophagogastroduodenoscopy, flexible,                            transoral; with biopsy, single or multiple Diagnosis Code(s):        --- Professional ---                           K44.9, Diaphragmatic hernia without obstruction or                            gangrene                           K21.00, Gastro-esophageal reflux disease with                            esophagitis, without bleeding  K22.2, Esophageal obstruction                           K29.70, Gastritis, unspecified, without bleeding                           R13.10, Dysphagia, unspecified                           R12, Heartburn CPT copyright 2022 American Medical Association. All rights reserved. The codes documented in this report are preliminary and upon coder review may  be revised to meet current compliance requirements. Carlin POUR. Cindie, DO Carlin POUR. Konstantine Gervasi, DO 09/03/2024 9:46:03 AM This report has been signed electronically. Number of Addenda: 0

## 2024-09-03 NOTE — Anesthesia Preprocedure Evaluation (Signed)
 Anesthesia Evaluation  Patient identified by MRN, date of birth, ID band Patient awake    Reviewed: Allergy & Precautions, H&P , NPO status , Patient's Chart, lab work & pertinent test results, reviewed documented beta blocker date and time   Airway Mallampati: II  TM Distance: >3 FB Neck ROM: full    Dental no notable dental hx. (+) Upper Dentures, Lower Dentures   Pulmonary shortness of breath, pneumonia, COPD,  COPD inhaler, former smoker   Pulmonary exam normal breath sounds clear to auscultation       Cardiovascular Exercise Tolerance: Good hypertension, negative cardio ROS  Rhythm:regular Rate:Normal     Neuro/Psych  Headaches  Neuromuscular disease negative neurological ROS  negative psych ROS   GI/Hepatic negative GI ROS, Neg liver ROS,GERD  ,,  Endo/Other  negative endocrine ROSHypothyroidism  Class 3 obesity  Renal/GU Renal diseasenegative Renal ROS  negative genitourinary   Musculoskeletal   Abdominal   Peds  Hematology negative hematology ROS (+) Blood dyscrasia, anemia   Anesthesia Other Findings   Reproductive/Obstetrics negative OB ROS                              Anesthesia Physical Anesthesia Plan  ASA: 3  Anesthesia Plan: General   Post-op Pain Management:    Induction:   PONV Risk Score and Plan: Propofol  infusion  Airway Management Planned:   Additional Equipment:   Intra-op Plan:   Post-operative Plan:   Informed Consent: I have reviewed the patients History and Physical, chart, labs and discussed the procedure including the risks, benefits and alternatives for the proposed anesthesia with the patient or authorized representative who has indicated his/her understanding and acceptance.     Dental Advisory Given  Plan Discussed with: CRNA  Anesthesia Plan Comments:         Anesthesia Quick Evaluation

## 2024-09-03 NOTE — Transfer of Care (Signed)
 Immediate Anesthesia Transfer of Care Note  Patient: Latoya Freeman  Procedure(s) Performed: EGD (ESOPHAGOGASTRODUODENOSCOPY) DILATION, ESOPHAGUS  Patient Location: PACU  Anesthesia Type:MAC  Level of Consciousness: awake and alert   Airway & Oxygen Therapy: Patient Spontanous Breathing and Patient connected to nasal cannula oxygen  Post-op Assessment: Report given to RN and Post -op Vital signs reviewed and stable  Post vital signs: Reviewed and stable  Last Vitals:  Vitals Value Taken Time  BP    Temp    Pulse    Resp    SpO2      Last Pain:  Vitals:   09/03/24 0930  PainSc: 0-No pain         Complications: No notable events documented.

## 2024-09-03 NOTE — Interval H&P Note (Signed)
 History and Physical Interval Note:  09/03/2024 8:54 AM  Latoya Freeman  has presented today for surgery, with the diagnosis of esophageal stricture, Schatzki's ring, esophageal web, peptic ulcer disease, esophagitis, gastritis, H. Pylori.  The various methods of treatment have been discussed with the patient and family. After consideration of risks, benefits and other options for treatment, the patient has consented to  Procedure(s) with comments: EGD (ESOPHAGOGASTRODUODENOSCOPY) (N/A) - 9:30 AM, ASA 3 DILATION, ESOPHAGUS (N/A) as a surgical intervention.  The patient's history has been reviewed, patient examined, no change in status, stable for surgery.  I have reviewed the patient's chart and labs.  Questions were answered to the patient's satisfaction.     Latoya Freeman

## 2024-09-03 NOTE — Anesthesia Postprocedure Evaluation (Signed)
 Anesthesia Post Note  Patient: Latoya Freeman  Procedure(s) Performed: EGD (ESOPHAGOGASTRODUODENOSCOPY) DILATION, ESOPHAGUS  Patient location during evaluation: Phase II Anesthesia Type: General Level of consciousness: awake Pain management: pain level controlled Vital Signs Assessment: post-procedure vital signs reviewed and stable Respiratory status: spontaneous breathing and respiratory function stable Cardiovascular status: blood pressure returned to baseline and stable Postop Assessment: no headache and no apparent nausea or vomiting Anesthetic complications: no Comments: Late entry   No notable events documented.   Last Vitals:  Vitals:   09/03/24 0800 09/03/24 0956  BP: (!) 159/86 130/63  Pulse: 74 70  Resp: (!) 22 16  Temp: 36.9 C 36.6 C  SpO2: 98% 95%    Last Pain:  Vitals:   09/03/24 0956  TempSrc: Oral  PainSc: 0-No pain                 Yvonna JINNY Bosworth

## 2024-09-04 LAB — SURGICAL PATHOLOGY

## 2024-09-05 ENCOUNTER — Encounter (HOSPITAL_COMMUNITY): Payer: Self-pay | Admitting: Internal Medicine

## 2024-09-11 DIAGNOSIS — M9902 Segmental and somatic dysfunction of thoracic region: Secondary | ICD-10-CM | POA: Diagnosis not present

## 2024-09-11 DIAGNOSIS — M62830 Muscle spasm of back: Secondary | ICD-10-CM | POA: Diagnosis not present

## 2024-09-11 DIAGNOSIS — M9903 Segmental and somatic dysfunction of lumbar region: Secondary | ICD-10-CM | POA: Diagnosis not present

## 2024-09-11 DIAGNOSIS — M9901 Segmental and somatic dysfunction of cervical region: Secondary | ICD-10-CM | POA: Diagnosis not present

## 2024-09-25 DIAGNOSIS — M9901 Segmental and somatic dysfunction of cervical region: Secondary | ICD-10-CM | POA: Diagnosis not present

## 2024-09-25 DIAGNOSIS — M62830 Muscle spasm of back: Secondary | ICD-10-CM | POA: Diagnosis not present

## 2024-09-25 DIAGNOSIS — M9902 Segmental and somatic dysfunction of thoracic region: Secondary | ICD-10-CM | POA: Diagnosis not present

## 2024-09-25 DIAGNOSIS — M9903 Segmental and somatic dysfunction of lumbar region: Secondary | ICD-10-CM | POA: Diagnosis not present

## 2024-09-30 ENCOUNTER — Ambulatory Visit: Payer: Self-pay | Admitting: Internal Medicine

## 2024-10-06 ENCOUNTER — Other Ambulatory Visit: Payer: Self-pay

## 2024-10-06 ENCOUNTER — Encounter: Payer: Self-pay | Admitting: Emergency Medicine

## 2024-10-06 ENCOUNTER — Emergency Department

## 2024-10-06 ENCOUNTER — Emergency Department
Admission: EM | Admit: 2024-10-06 | Discharge: 2024-10-06 | Disposition: A | Discharge status: Home / Self Care | Attending: Emergency Medicine | Admitting: Emergency Medicine

## 2024-10-06 DIAGNOSIS — N189 Chronic kidney disease, unspecified: Secondary | ICD-10-CM | POA: Diagnosis not present

## 2024-10-06 DIAGNOSIS — J40 Bronchitis, not specified as acute or chronic: Secondary | ICD-10-CM | POA: Insufficient documentation

## 2024-10-06 DIAGNOSIS — R9389 Abnormal findings on diagnostic imaging of other specified body structures: Secondary | ICD-10-CM | POA: Diagnosis not present

## 2024-10-06 DIAGNOSIS — J9801 Acute bronchospasm: Secondary | ICD-10-CM

## 2024-10-06 DIAGNOSIS — I129 Hypertensive chronic kidney disease with stage 1 through stage 4 chronic kidney disease, or unspecified chronic kidney disease: Secondary | ICD-10-CM | POA: Insufficient documentation

## 2024-10-06 DIAGNOSIS — R0602 Shortness of breath: Secondary | ICD-10-CM | POA: Diagnosis not present

## 2024-10-06 DIAGNOSIS — R0902 Hypoxemia: Secondary | ICD-10-CM | POA: Diagnosis not present

## 2024-10-06 LAB — BASIC METABOLIC PANEL WITH GFR
Anion gap: 13 (ref 5–15)
BUN: 12 mg/dL (ref 8–23)
CO2: 24 mmol/L (ref 22–32)
Calcium: 10.1 mg/dL (ref 8.9–10.3)
Chloride: 103 mmol/L (ref 98–111)
Creatinine, Ser: 1.36 mg/dL — ABNORMAL HIGH (ref 0.44–1.00)
GFR, Estimated: 39 mL/min — ABNORMAL LOW (ref >60)
Glucose, Bld: 93 mg/dL (ref 70–99)
Potassium: 3.8 mmol/L (ref 3.5–5.1)
Sodium: 141 mmol/L (ref 135–145)

## 2024-10-06 LAB — CBC
HCT: 41.9 % (ref 36.0–46.0)
Hemoglobin: 14 g/dL (ref 12.0–15.0)
MCH: 29.4 pg (ref 26.0–34.0)
MCHC: 33.4 g/dL (ref 30.0–36.0)
MCV: 88 fL (ref 80.0–100.0)
Platelets: 286 K/uL (ref 150–400)
RBC: 4.76 MIL/uL (ref 3.87–5.11)
RDW: 13.3 % (ref 11.5–15.5)
WBC: 7.9 K/uL (ref 4.0–10.5)
nRBC: 0 % (ref 0.0–0.2)

## 2024-10-06 LAB — PRO BRAIN NATRIURETIC PEPTIDE: Pro Brain Natriuretic Peptide: 151 pg/mL (ref <300.0)

## 2024-10-06 LAB — TROPONIN T, HIGH SENSITIVITY: Troponin T High Sensitivity: <15 ng/L (ref 0–19)

## 2024-10-06 MED ORDER — ALBUTEROL SULFATE (2.5 MG/3ML) 0.083% IN NEBU
2.5000 mg | INHALATION_SOLUTION | Freq: Once | RESPIRATORY_TRACT | Status: AC
  Administered 2024-10-06: 2.5 mg via RESPIRATORY_TRACT
  Filled 2024-10-06: qty 3

## 2024-10-06 MED ORDER — ALBUTEROL SULFATE HFA 108 (90 BASE) MCG/ACT IN AERS: 2.0000 | INHALATION_SPRAY | RESPIRATORY_TRACT | 0 refills | Status: AC | PRN

## 2024-10-06 MED ORDER — PREDNISONE 50 MG PO TABS: 50.0000 mg | ORAL_TABLET | Freq: Every day | ORAL | 0 refills | Status: AC

## 2024-10-06 NOTE — ED Notes (Signed)
 Pt to ED with c/o episode of shortness of breath that occurred while she was eating tonight. Reports episode has since resolved but states her O2 sat dropped to 82%. Pt reports this has been an ongoing issue. Was told recently after a breathing test that she is only breathing 40%. Pt also with hx of Pes. On eliquis . Reports compliance. Denies chest pain. Pt on CCM and pulse ox, call bell within reach, NAD noted

## 2024-10-06 NOTE — ED Provider Notes (Signed)
 [facility] Provider Note    Event Date/Time   First MD Initiated Contact with Patient 10/06/24 1929     (approximate)   History   Shortness of Breath   HPI  Latoya Freeman is a 83 y.o. female with a history of hypertension, CKD, anemia, and hyperparathyroidism who presents with shortness of breath and low oxygen levels.  The patient states that this started a few weeks ago when she was getting an endoscopy and was told that her oxygen was low and needed to be put on oxygen.  Several days ago and again today, the patient started to feel short of breath.  She went to lie down, checked her oxygen level on a pulse ox and found it to be dropping to as low as the 80s.  The patient states that her breathing is somewhat better now.  Her family member who is with her states that she looks unwell compared to baseline, with a flushed face and weak eyes, however she is not altered or confused.  I reviewed the past medical records.  The patient's most recent outpatient encounter was on 9/30 with Dr. Douglas from nephrology for follow-up of her chronic conditions.   Physical Exam   Triage Vital Signs: ED Triage Vitals  Encounter Vitals Group     BP 10/06/24 1914 (!) 146/77     Girls Systolic BP Percentile --      Girls Diastolic BP Percentile --      Boys Systolic BP Percentile --      Boys Diastolic BP Percentile --      Pulse Rate 10/06/24 1914 73     Resp 10/06/24 1914 19     Temp 10/06/24 1916 98.6 F (37 C)     Temp Source 10/06/24 1916 Oral     SpO2 10/06/24 1914 99 %     Weight --      Height --      Head Circumference --      Peak Flow --      Pain Score 10/06/24 1915 0     Pain Loc --      Pain Education --      Exclude from Growth Chart --     Most recent vital signs: Vitals:   10/06/24 1914 10/06/24 1916  BP: (!) 146/77   Pulse: 73   Resp: 19   Temp:  98.6 F (37 C)  SpO2: 99%      General: Alert, no distress.  CV:  Good peripheral  perfusion.  Resp:  Normal effort.  Lungs CTAB. Abd:  No distention.  Other:  No peripheral edema.   ED Results / Procedures / Treatments   Labs (all labs ordered are listed, but only abnormal results are displayed) Labs Reviewed  BASIC METABOLIC PANEL WITH GFR - Abnormal; Notable for the following components:      Result Value   Creatinine, Ser 1.36 (*)    GFR, Estimated 39 (*)    All other components within normal limits  CBC  PRO BRAIN NATRIURETIC PEPTIDE  TROPONIN T, HIGH SENSITIVITY     EKG  ED ECG REPORT I, Waylon Cassis, the attending physician, personally viewed and interpreted this ECG.  Date: 10/06/2024 EKG Time: 1917 Rate: 72 Rhythm: normal sinus rhythm QRS Axis: Left axis Intervals: normal ST/T Wave abnormalities: Nonspecific T wave abnormalities Narrative Interpretation: no evidence of acute ischemia   RADIOLOGY  Chest x-ray: I independently viewed and interpreted the images; there  is no focal consolidation or edema  PROCEDURES:  Critical Care performed: No  Procedures   MEDICATIONS ORDERED IN ED: Medications  albuterol  (PROVENTIL ) (2.5 MG/3ML) 0.083% nebulizer solution 2.5 mg (2.5 mg Nebulization Given 10/06/24 2249)     IMPRESSION / MDM / ASSESSMENT AND PLAN / ED COURSE  I reviewed the triage vital signs and the nursing notes.  83 year old female with PMH as noted above presents with episodes of shortness of breath and low oxygen levels.  However, currently her O2 sat is in the high 90s on room air and she is not having any increased work of breathing.  Lungs are clear to auscultation.  The patient does take an inhaler periodically.  She also has CKD.  Differential diagnosis includes, but is not limited to, reactive airways, bronchospasm, acute bronchitis, bacterial pneumonia, fluid overload related to CKD, asthma/COPD.  We will obtain chest x-ray, lab workup, and reassess.  Patient's presentation is most consistent with acute  complicated illness / injury requiring diagnostic workup.  The patient is on the cardiac monitor to evaluate for evidence of arrhythmia and/or significant heart rate changes.   ----------------------------------------- 10:53 PM on 10/06/2024 -----------------------------------------  Chest x-ray is clear.  BMP and CBC showed no acute findings.  Troponin is negative.  BNP is also negative.  Overall presentation is consistent with bronchospasm/reactive airways, possible acute bronchitis, but there is no evidence of CHF, pneumonia, or PE.  The patient remains with an O2 saturation on room air in the mid 90s.  The pulse ox will occasionally read a lower number but during these runs it is showing a poor waveform.  When the waveform is good, the saturation is in the 90s.  The patient is feeling well right now.  At this time, I feel that the patient is stable for discharge.  She feels comfortable going home.  We will give a nebulizer treatment here, and I have prescribed her a short acting albuterol  as well as steroid course for home.  I gave the patient strict return precautions, and she expressed understanding.   FINAL CLINICAL IMPRESSION(S) / ED DIAGNOSES   Final diagnoses:  Bronchospasm     Rx / DC Orders   ED Discharge Orders          Ordered    predniSONE  (DELTASONE ) 50 MG tablet  Daily with breakfast        10/06/24 2240    albuterol  (VENTOLIN  HFA) 108 (90 Base) MCG/ACT inhaler  Every 4 hours PRN        10/06/24 2240             Note:  This document was prepared using Dragon voice recognition software and may include unintentional dictation errors.    Jacolyn Pae, MD 10/06/24 2255

## 2024-10-06 NOTE — Discharge Instructions (Signed)
 Take the prednisone  daily for 4 days.  Use albuterol  inhaler up to every 4 hours as needed for shortness of breath.  Continue using your long-acting inhaler and other regular medications.  Follow-up with your regular doctor.  Return to the ER for new, worsening, or persistent severe shortness of breath, low oxygen readings, chest pain, weakness, or any other new or worsening symptoms that concern you.

## 2024-10-06 NOTE — ED Triage Notes (Signed)
 Pt reports her oxygen levels are bouncing up and down. Reports it got to 82% at home. O2 in triage is 99% RA. Doesn't wear O2 at home. Reports she was eating tonight & felt SHOB. Denies chest pain.

## 2024-10-08 NOTE — Progress Notes (Unsigned)
 Hematology/Oncology Consult note [facility]  Telephone:([phone] [phone]  Patient Care Team: Valora Lynwood FALCON, MD as PCP - General (Family Medicine) Melanee Annah BROCKS, MD as Consulting Physician (Oncology)   Name of the patient: Latoya Freeman  [id number]  1941/01/17   Date of visit: 10/09/24  Diagnosis-history of pulmonary embolism on Eliquis  History of iron  deficiency anemia  Chief complaint/ Reason for visit-routine follow-up of iron  deficiency anemia and PE  Heme/Onc history: Patient is a 83 year old female who was seen by me in June 2020.  She was found to have unprovoked PE in December 2019 and has been on Eliquis  since then.  She has now been referred to me for anemia.  Most recent CBC from 07/20/2020 showed H&H of 9.7/29 with an MCV of 83 ferritin levels were low at 12 and iron  saturation low at 14%.  Patient has been following up with Dr. MALISSA for evaluation of celiac artery stenosis for symptoms of chronic mesenteric ischemia she underwent aortogram and selective angiogram of SMA and celiac artery which showed extrinsic compression creating more than 80% stenosis of the celiac artery possibly secondary to internal arcuate ligament.  She was evaluated by vascular surgery at [facility] for possible intervention of this ligament and subsequently underwent surgery with resolution of symptoms     Interval history-patient seen today with her daughter.  Patient independent ambulatory in no acute distress.  She reports having an episode of shortness of breath over this past weekend resulting in the ED visit.  She was started on steroids in which she has 2 more days left as well as started on breathing treatments.  She reports that her breathing has significantly improved and she is following with pulmonology as well.  Lungs are clear today no shortness of breath with conversation.  She did have some elevation in white blood cell count today on labs however  likely reactive secondary to steroid use.  No fever no chills no signs or symptoms of infection.  She denies any recent falls.  Denies any dizziness.  Denies seeing any blood in urine sputum or stool.  Denies any dark tarry stools.  Hemoglobin today is within normal limits does not need iron  infusion.  She does have a history of a pulmonary embolism currently on Eliquis .  Seems to be tolerating well no signs or symptoms of excessive bleeding.  No signs and symptoms of any other blood clots.  No other concerns today.  ECOG PS- 2 Pain scale- 0   Review of systems- Review of Systems  Constitutional:  Negative for chills, fever, malaise/fatigue and weight loss.  HENT:  Negative for congestion, ear discharge and nosebleeds.   Eyes:  Negative for blurred vision.  Respiratory:  Negative for cough, hemoptysis, sputum production, shortness of breath and wheezing.   Cardiovascular:  Negative for chest pain, palpitations, orthopnea and claudication.  Gastrointestinal:  Negative for abdominal pain, blood in stool, constipation, diarrhea, heartburn, melena, nausea and vomiting.  Genitourinary:  Negative for dysuria, flank pain, frequency, hematuria and urgency.  Musculoskeletal:  Negative for back pain, joint pain and myalgias.  Skin:  Negative for rash.  Neurological:  Negative for dizziness, tingling, focal weakness, seizures, weakness and headaches.  Endo/Heme/Allergies:  Does not bruise/bleed easily.  Psychiatric/Behavioral:  Negative for depression and suicidal ideas. The patient does not have insomnia.       Allergies  Allergen Reactions   Bee Venom Swelling   Shellfish Allergy Swelling   Tamoxifen Hives  Penicillins Hives, Rash and Other (See Comments)    Has patient had a PCN reaction causing immediate rash, facial/tongue/throat swelling, SOB or lightheadedness with hypotension: Unknown Has patient had a PCN reaction causing severe rash involving mucus membranes or skin necrosis:  Unknown Has patient had a PCN reaction that required hospitalization: Unknown Has patient had a PCN reaction occurring within the last 10 years: No If all of the above answers are NO, then may proceed with Cephalosporin use.      Past Medical History:  Diagnosis Date   Aneurysm 1980   Arthritis    CKD (chronic kidney disease)    Dyspnea    patient placed on Inhaler (Fluticasone propionate)bid onlast office visit 3 weeks ago in September 2025   GERD (gastroesophageal reflux disease)    Hypertension 1980   Hypothyroidism 1999   Median arcuate ligament syndrome    treated with ligament release and celiac artery stent, 07/2020.   Pneumonia    Pulmonary embolism (HCC)    Shingles    Wears dentures    full upper and lower     Past Surgical History:  Procedure Laterality Date   BRAIN SURGERY     aneurysm   BREAST CYST ASPIRATION Left 2005   BREAST EXCISIONAL BIOPSY Left 2013   atypical ductal hyperplasia   BREAST SURGERY  2013   LF Breast Wide EXC    CHOLECYSTECTOMY  2004   COLONOSCOPY     COLONOSCOPY WITH PROPOFOL  N/A 01/31/2018   Procedure: COLONOSCOPY WITH PROPOFOL ;  Surgeon: Gaylyn Gladis PENNER, MD;  Location: [facility] ENDOSCOPY;  Service: Endoscopy;  Laterality: N/A;   COLONOSCOPY WITH PROPOFOL  N/A 08/06/2020   Procedure: COLONOSCOPY WITH PROPOFOL ;  Surgeon: Janalyn Keene NOVAK, MD;  Location: [facility] ENDOSCOPY;  Service: Endoscopy;  Laterality: N/A;   DILATION AND CURETTAGE OF UTERUS N/A 05/03/2019   Procedure: DILATATION AND CURETTAGE;  Surgeon: Arloa Lamar SQUIBB, MD;  Location: [facility] ORS;  Service: Gynecology;  Laterality: N/A;   ESOPHAGEAL DILATION N/A 09/03/2024   Procedure: DILATION, ESOPHAGUS;  Surgeon: Cindie Carlin POUR, DO;  Location: [facility] ENDO SUITE;  Service: Endoscopy;  Laterality: N/A;   ESOPHAGOGASTRODUODENOSCOPY N/A 01/31/2018   Procedure: ESOPHAGOGASTRODUODENOSCOPY (EGD);  Surgeon: Gaylyn Gladis PENNER, MD;  Location: [facility] ENDOSCOPY;  Service: Endoscopy;  Laterality:  N/A;   ESOPHAGOGASTRODUODENOSCOPY N/A 09/03/2024   Procedure: EGD (ESOPHAGOGASTRODUODENOSCOPY);  Surgeon: Cindie Carlin POUR, DO;  Location: [facility] ENDO SUITE;  Service: Endoscopy;  Laterality: N/A;  9:30 AM, ASA 3   ESOPHAGOGASTRODUODENOSCOPY (EGD) WITH PROPOFOL  N/A 08/06/2020   Procedure: ESOPHAGOGASTRODUODENOSCOPY (EGD) WITH PROPOFOL ;  Surgeon: Janalyn Keene NOVAK, MD;  Location: [facility] ENDOSCOPY;  Service: Endoscopy;  Laterality: N/A;   LUMBAR LAMINECTOMY/DECOMPRESSION MICRODISCECTOMY N/A 07/01/2021   Procedure: LEFT L3-4 MICRODSICECTOMY, L4-S1 DECOMPRESSION;  Surgeon: Clois Fret, MD;  Location: [facility] ORS;  Service: Neurosurgery;  Laterality: N/A;   LUMBAR PUNCTURE     SHOULDER ARTHROSCOPY Left 07/25/2018   Procedure: SHOULDER MINI OPEN ROTATOR CUFF REPAIR  BICEPS TENDOSIS ARTHROSCOPIC DISTAL CLAVICLE EXCISION  SUBACROMIAL DECOMP;  Surgeon: Tobie Priest, MD;  Location: [facility] SURGERY [facility];  Service: Orthopedics;  Laterality: Left;  [facility] WITH SPYDER SMITH & NEPHEW HEAD COIL ANCHOR FOOTPRINT ANCHOR QFIX ANCHOR   VISCERAL ANGIOGRAPHY N/A 08/14/2020   Procedure: VISCERAL ANGIOGRAPHY;  Surgeon: Marea Selinda RAMAN, MD;  Location: [facility] INVASIVE CV LAB;  Service: Cardiovascular;  Laterality: N/A;   VISCERAL ANGIOGRAPHY N/A 09/03/2021   Procedure: VISCERAL ANGIOGRAPHY;  Surgeon: Marea Selinda RAMAN, MD;  Location: [facility] INVASIVE CV LAB;  Service: Cardiovascular;  Laterality: N/A;    Social History   Socioeconomic History   Marital status: Divorced    Spouse name: Not on file   Number of children: Not on file   Years of education: Not on file   Highest education level: Not on file  Occupational History   Not on file  Tobacco Use   Smoking status: Former    Current packs/day: 0.00    Average packs/day: 1.5 packs/day for 20.0 years (30.0 ttl pk-yrs)    Types: Cigarettes    Start date: 47    Quit date: 66    Years since quitting: 35.9   Smokeless tobacco: Never  Vaping Use   Vaping  status: Never Used  Substance and Sexual Activity   Alcohol use: No   Drug use: No   Sexual activity: Not on file  Other Topics Concern   Not on file  Social History Narrative   Lives alone   Social Drivers of Health   Financial Resource Strain: Patient Declined (08/07/2024)   Received from [facility]   Overall Financial Resource Strain (CARDIA)    Difficulty of Paying Living Expenses: Patient declined  Food Insecurity: Patient Declined (08/07/2024)   Received [facility]   Hunger Vital Sign    Within the past 12 months, you worried that your food would run out before you got the money to buy more.: Patient declined    Within the past 12 months, the food you bought just didn't last and you didn't have money to get more.: Patient declined  Transportation Needs: Patient Declined (08/07/2024)   Received from [facility] - Transportation    In the past 12 months, has lack of transportation kept you from medical appointments or from getting medications?: Patient declined    Lack of Transportation (Non-Medical): Patient declined  Physical Activity: Not on file  Stress: Not on file  Social Connections: Not on file  Intimate Partner Violence: Not on file    Family History  Problem Relation Age of Onset   Other Brother        Lynch syndrome/colon cancer, age <60   Breast cancer Neg Hx      Current Outpatient Medications:    acetaminophen  (TYLENOL ) 325 MG tablet, Take 2 tablets (650 mg total) by mouth every 6 (six) hours as needed for mild pain, moderate pain, fever or headache (or Fever >/= 101)., Disp: , Rfl:    albuterol  (VENTOLIN  HFA) 108 (90 Base) MCG/ACT inhaler, Inhale 2 puffs into the lungs every 4 (four) hours as needed for wheezing or shortness of breath., Disp: 8 g, Rfl: 0   amLODipine  (NORVASC ) 5 MG tablet, Take 5 mg by mouth daily., Disp: , Rfl:    citalopram  (CELEXA ) 20 MG tablet, Take 20 mg by mouth  daily., Disp: , Rfl:    dexlansoprazole  (DEXILANT ) 60 MG capsule, Take 1 capsule (60 mg total) by mouth daily., Disp: 90 capsule, Rfl: 3   ELIQUIS  5 MG TABS tablet, Take 1 tablet by mouth twice daily, Disp: 180 tablet, Rfl: 0   fluticasone-salmeterol (ADVAIR) 250-50 MCG/ACT AEPB, Inhale 1 puff into the lungs in the morning and at bedtime., Disp: , Rfl:    furosemide (LASIX) 20 MG tablet, Take 20 mg by mouth daily., Disp: , Rfl:    latanoprost (XALATAN) 0.005 % ophthalmic solution, 1 drop daily., Disp: , Rfl:    levothyroxine  (SYNTHROID , LEVOTHROID) 75 MCG tablet, Take 75  mcg by mouth daily., Disp: , Rfl:    losartan  (COZAAR ) 50 MG tablet, Take 50 mg by mouth daily. , Disp: , Rfl:    nortriptyline (PAMELOR) 10 MG capsule, Take 20 mg by mouth at bedtime., Disp: , Rfl:    omeprazole  (PRILOSEC) 20 MG capsule, Take 20 mg by mouth 2 (two) times daily., Disp: , Rfl:    predniSONE  (DELTASONE ) 50 MG tablet, Take 1 tablet (50 mg total) by mouth daily with breakfast for 4 days., Disp: 4 tablet, Rfl: 0   solifenacin (VESICARE) 5 MG tablet, Take 5 mg by mouth daily., Disp: , Rfl:   Physical exam:  Vitals:   10/09/24 1259 10/09/24 1328  BP: (!) 153/70 (!) 153/72  Pulse: 69   Resp: 20   Temp: 98.9 F (37.2 C)   TempSrc: Tympanic   SpO2: 95%   Weight: 202 lb 14.4 oz (92 kg)   Height: 5' (1.524 m)     Physical Exam Cardiovascular:     Rate and Rhythm: Normal rate and regular rhythm.     Heart sounds: Normal heart sounds.  Pulmonary:     Effort: Pulmonary effort is normal.     Breath sounds: Normal breath sounds.  Skin:    General: Skin is warm and dry.  Neurological:     Mental Status: She is alert and oriented to person, place, and time.         Latest Ref Rng & Units 10/06/2024    7:17 PM  CMP  Glucose 70 - 99 mg/dL 93   BUN 8 - 23 mg/dL 12   Creatinine 9.55 - 1.00 mg/dL 8.63   Sodium 864 - 854 mmol/L 141   Potassium 3.5 - 5.1 mmol/L 3.8   Chloride 98 - 111 mmol/L 103   CO2 22 -  32 mmol/L 24   Calcium  8.9 - 10.3 mg/dL 89.8       Latest Ref Rng & Units 10/09/2024    1:05 PM  CBC  WBC 4.0 - 10.5 K/uL 12.2   Hemoglobin 12.0 - 15.0 g/dL 85.5   Hematocrit 63.9 - 46.0 % 43.3   Platelets 150 - 400 K/uL 286     No images are attached to the encounter.  DG Chest 2 View Result Date: 10/06/2024 CLINICAL DATA:  Shortness of breath, hypoxia EXAM: CHEST - 2 VIEW COMPARISON:  07/12/2021 FINDINGS: Frontal and lateral views of the chest demonstrate a stable cardiac silhouette. Chronic elevation of the right hemidiaphragm. No airspace disease, effusion, or pneumothorax. No acute bony abnormalities. IMPRESSION: 1. No acute intrathoracic process. Electronically Signed   By: Ozell Daring M.D.   On: 10/06/2024 19:48     Assessment and plan- Patient is a 83 y.o. female who is here for follow-up of following issues:  History of iron  deficiency anemia: Patient has not received any IV ironFor about a year now.  Her hemoglobin is presently normal at 14.4.  Iron  studies from today are pending.  CBC ferritin and iron  studies see MD in .  History of pulmonary embolism: Currently on Eliquis  which she wishes to continue.  No recent history of bleeding or falls.  Bronchospasm: Patient had an acute episode of shortness of breath over the weekend.  She was seen in the emergency room and diagnosed with bronchospasm.  Follow-up with pulmonology.  She now has an as needed Ventolin  inhaler.  She is also finishing up a round of prednisone  this week.  She reports resolution in's.  No shortness of  breath or wheezing today.  Deferred to pulmonary and pcp  Leukocytosis: WBC 12.2 today likely reactive due to being on a steroid dose this week secondary to bronchospasm.  No fever or chills no other signs or symptoms of infection defer to PCP.   F/U Plan No iron  today F/u in 6 mths see MD/NP with cbc with diff, cmp, ferritin, iron  and TIBC     Morna Husband AGNP-C [facility] [phone] 10/09/2024 2:04 PM

## 2024-10-09 ENCOUNTER — Inpatient Hospital Stay: Attending: Oncology

## 2024-10-09 ENCOUNTER — Inpatient Hospital Stay (HOSPITAL_BASED_OUTPATIENT_CLINIC_OR_DEPARTMENT_OTHER): Admitting: Nurse Practitioner

## 2024-10-09 ENCOUNTER — Encounter: Payer: Self-pay | Admitting: Nurse Practitioner

## 2024-10-09 VITALS — BP 153/72 | HR 69 | Temp 98.9°F | Resp 20 | Ht 60.0 in | Wt 202.9 lb

## 2024-10-09 DIAGNOSIS — D509 Iron deficiency anemia, unspecified: Secondary | ICD-10-CM | POA: Diagnosis not present

## 2024-10-09 DIAGNOSIS — D72829 Elevated white blood cell count, unspecified: Secondary | ICD-10-CM | POA: Diagnosis not present

## 2024-10-09 DIAGNOSIS — M9901 Segmental and somatic dysfunction of cervical region: Secondary | ICD-10-CM | POA: Diagnosis not present

## 2024-10-09 DIAGNOSIS — D508 Other iron deficiency anemias: Secondary | ICD-10-CM

## 2024-10-09 DIAGNOSIS — Z86711 Personal history of pulmonary embolism: Secondary | ICD-10-CM | POA: Diagnosis not present

## 2024-10-09 DIAGNOSIS — Z87891 Personal history of nicotine dependence: Secondary | ICD-10-CM | POA: Diagnosis not present

## 2024-10-09 DIAGNOSIS — M9903 Segmental and somatic dysfunction of lumbar region: Secondary | ICD-10-CM | POA: Diagnosis not present

## 2024-10-09 DIAGNOSIS — J9801 Acute bronchospasm: Secondary | ICD-10-CM | POA: Diagnosis not present

## 2024-10-09 DIAGNOSIS — Z7901 Long term (current) use of anticoagulants: Secondary | ICD-10-CM | POA: Diagnosis not present

## 2024-10-09 DIAGNOSIS — Z7952 Long term (current) use of systemic steroids: Secondary | ICD-10-CM | POA: Diagnosis not present

## 2024-10-09 DIAGNOSIS — M9902 Segmental and somatic dysfunction of thoracic region: Secondary | ICD-10-CM | POA: Diagnosis not present

## 2024-10-09 DIAGNOSIS — M62830 Muscle spasm of back: Secondary | ICD-10-CM | POA: Diagnosis not present

## 2024-10-09 LAB — CBC WITH DIFFERENTIAL (CANCER CENTER ONLY)
Abs Immature Granulocytes: 0.08 K/uL — ABNORMAL HIGH (ref 0.00–0.07)
Basophils Absolute: 0 K/uL (ref 0.0–0.1)
Basophils Relative: 0 %
Eosinophils Absolute: 0 K/uL (ref 0.0–0.5)
Eosinophils Relative: 0 %
HCT: 43.3 % (ref 36.0–46.0)
Hemoglobin: 14.4 g/dL (ref 12.0–15.0)
Immature Granulocytes: 1 %
Lymphocytes Relative: 14 %
Lymphs Abs: 1.7 K/uL (ref 0.7–4.0)
MCH: 29.1 pg (ref 26.0–34.0)
MCHC: 33.3 g/dL (ref 30.0–36.0)
MCV: 87.7 fL (ref 80.0–100.0)
Monocytes Absolute: 0.6 K/uL (ref 0.1–1.0)
Monocytes Relative: 5 %
Neutro Abs: 9.9 K/uL — ABNORMAL HIGH (ref 1.7–7.7)
Neutrophils Relative %: 80 %
Platelet Count: 286 K/uL (ref 150–400)
RBC: 4.94 MIL/uL (ref 3.87–5.11)
RDW: 13.5 % (ref 11.5–15.5)
WBC Count: 12.2 K/uL — ABNORMAL HIGH (ref 4.0–10.5)
nRBC: 0 % (ref 0.0–0.2)

## 2024-10-09 LAB — FERRITIN: Ferritin: 40 ng/mL (ref 11–307)

## 2024-10-09 LAB — IRON AND TIBC
Iron: 71 ug/dL (ref 28–170)
Saturation Ratios: 18 % (ref 10.4–31.8)
TIBC: 403 ug/dL (ref 250–450)
UIBC: 332 ug/dL

## 2024-10-09 NOTE — Addendum Note (Signed)
 Addended by: ALAINA WADDELL SAUNDERS on: 10/09/2024 02:11 PM   Modules accepted: Orders

## 2024-10-24 DIAGNOSIS — M9901 Segmental and somatic dysfunction of cervical region: Secondary | ICD-10-CM | POA: Diagnosis not present

## 2024-10-24 DIAGNOSIS — M9903 Segmental and somatic dysfunction of lumbar region: Secondary | ICD-10-CM | POA: Diagnosis not present

## 2024-10-24 DIAGNOSIS — M62830 Muscle spasm of back: Secondary | ICD-10-CM | POA: Diagnosis not present

## 2024-10-24 DIAGNOSIS — M9902 Segmental and somatic dysfunction of thoracic region: Secondary | ICD-10-CM | POA: Diagnosis not present

## 2024-10-26 ENCOUNTER — Telehealth: Payer: Self-pay

## 2024-10-26 NOTE — Telephone Encounter (Signed)
 Phoned and spoke with the pt this morning and was advised by her that Dexalansoprazole is too strong and she did authorize [employer] to send you this piece of documentation. On your desk in blue folder. Please advise

## 2024-10-31 ENCOUNTER — Encounter: Payer: Self-pay | Admitting: Internal Medicine

## 2024-11-15 ENCOUNTER — Other Ambulatory Visit: Payer: Self-pay | Admitting: Oncology

## 2024-11-15 DIAGNOSIS — D508 Other iron deficiency anemias: Secondary | ICD-10-CM

## 2024-11-15 DIAGNOSIS — I2699 Other pulmonary embolism without acute cor pulmonale: Secondary | ICD-10-CM

## 2024-11-19 ENCOUNTER — Encounter: Payer: Self-pay | Admitting: Oncology

## 2024-12-24 ENCOUNTER — Other Ambulatory Visit: Payer: Self-pay | Admitting: Family Medicine

## 2024-12-24 DIAGNOSIS — Z1231 Encounter for screening mammogram for malignant neoplasm of breast: Secondary | ICD-10-CM

## 2025-01-11 ENCOUNTER — Encounter

## 2025-04-09 ENCOUNTER — Inpatient Hospital Stay

## 2025-04-09 ENCOUNTER — Inpatient Hospital Stay: Admitting: Oncology
